# Patient Record
Sex: Male | Born: 1945 | Race: White | Hispanic: No | Marital: Married | Smoking: Former smoker
Health system: Southern US, Community
[De-identification: ages and names within clinical notes are randomized; demographics above are authoritative.]

## PROBLEM LIST (undated history)

## (undated) DIAGNOSIS — F32A Depression, unspecified: Secondary | ICD-10-CM

## (undated) DIAGNOSIS — N3289 Other specified disorders of bladder: Secondary | ICD-10-CM

## (undated) DIAGNOSIS — R7881 Bacteremia: Secondary | ICD-10-CM

## (undated) DIAGNOSIS — F329 Major depressive disorder, single episode, unspecified: Secondary | ICD-10-CM

## (undated) DIAGNOSIS — N189 Chronic kidney disease, unspecified: Secondary | ICD-10-CM

## (undated) DIAGNOSIS — D494 Neoplasm of unspecified behavior of bladder: Secondary | ICD-10-CM

## (undated) DIAGNOSIS — M109 Gout, unspecified: Secondary | ICD-10-CM

## (undated) DIAGNOSIS — C801 Malignant (primary) neoplasm, unspecified: Secondary | ICD-10-CM

## (undated) DIAGNOSIS — E785 Hyperlipidemia, unspecified: Secondary | ICD-10-CM

## (undated) DIAGNOSIS — G709 Myoneural disorder, unspecified: Secondary | ICD-10-CM

## (undated) DIAGNOSIS — G2581 Restless legs syndrome: Secondary | ICD-10-CM

## (undated) DIAGNOSIS — T7840XA Allergy, unspecified, initial encounter: Secondary | ICD-10-CM

## (undated) DIAGNOSIS — Z905 Acquired absence of kidney: Secondary | ICD-10-CM

## (undated) HISTORY — DX: Other specified disorders of bladder: N32.89

## (undated) HISTORY — DX: Acquired absence of kidney: Z90.5

## (undated) HISTORY — DX: Allergy, unspecified, initial encounter: T78.40XA

## (undated) HISTORY — PX: APPENDECTOMY: SHX54

## (undated) HISTORY — DX: Hyperlipidemia, unspecified: E78.5

## (undated) HISTORY — DX: Major depressive disorder, single episode, unspecified: F32.9

## (undated) HISTORY — DX: Myoneural disorder, unspecified: G70.9

## (undated) HISTORY — DX: Chronic kidney disease, unspecified: N18.9

## (undated) HISTORY — PX: TONSILLECTOMY AND ADENOIDECTOMY: SUR1326

## (undated) HISTORY — DX: Restless legs syndrome: G25.81

## (undated) HISTORY — DX: Depression, unspecified: F32.A

## (undated) HISTORY — PX: NASAL SEPTUM SURGERY: SHX37

## (undated) HISTORY — DX: Gout, unspecified: M10.9

---

## 2004-04-23 ENCOUNTER — Ambulatory Visit (HOSPITAL_BASED_OUTPATIENT_CLINIC_OR_DEPARTMENT_OTHER): Admission: RE | Admit: 2004-04-23 | Discharge: 2004-04-23 | Payer: Self-pay | Admitting: Otolaryngology

## 2005-01-05 HISTORY — PX: FRACTURE SURGERY: SHX138

## 2005-01-23 ENCOUNTER — Ambulatory Visit (HOSPITAL_COMMUNITY): Admission: RE | Admit: 2005-01-23 | Discharge: 2005-01-24 | Payer: Self-pay | Admitting: Orthopedic Surgery

## 2005-01-23 ENCOUNTER — Ambulatory Visit (HOSPITAL_COMMUNITY): Admission: RE | Admit: 2005-01-23 | Discharge: 2005-01-23 | Payer: Self-pay | Admitting: Orthopedic Surgery

## 2006-03-24 ENCOUNTER — Ambulatory Visit (HOSPITAL_COMMUNITY): Admission: RE | Admit: 2006-03-24 | Discharge: 2006-03-24 | Payer: Self-pay | Admitting: Otolaryngology

## 2008-07-08 DIAGNOSIS — N3289 Other specified disorders of bladder: Secondary | ICD-10-CM

## 2008-07-08 HISTORY — DX: Other specified disorders of bladder: N32.89

## 2008-07-08 HISTORY — PX: BLADDER SURGERY: SHX569

## 2008-09-17 ENCOUNTER — Inpatient Hospital Stay (HOSPITAL_COMMUNITY): Admission: EM | Admit: 2008-09-17 | Discharge: 2008-09-20 | Payer: Self-pay | Admitting: Emergency Medicine

## 2009-05-01 ENCOUNTER — Encounter (INDEPENDENT_AMBULATORY_CARE_PROVIDER_SITE_OTHER): Payer: Self-pay | Admitting: *Deleted

## 2009-05-24 ENCOUNTER — Encounter (INDEPENDENT_AMBULATORY_CARE_PROVIDER_SITE_OTHER): Payer: Self-pay

## 2009-05-26 ENCOUNTER — Ambulatory Visit: Payer: Self-pay | Admitting: Gastroenterology

## 2009-06-05 ENCOUNTER — Ambulatory Visit: Payer: Self-pay | Admitting: Gastroenterology

## 2009-06-05 HISTORY — PX: COLONOSCOPY: SHX174

## 2009-06-09 ENCOUNTER — Encounter: Payer: Self-pay | Admitting: Gastroenterology

## 2010-10-03 ENCOUNTER — Emergency Department (HOSPITAL_COMMUNITY): Payer: 59

## 2010-10-03 ENCOUNTER — Observation Stay (HOSPITAL_COMMUNITY)
Admission: EM | Admit: 2010-10-03 | Discharge: 2010-10-04 | Disposition: A | Discharge status: Home / Self Care | Payer: 59 | Attending: Internal Medicine | Admitting: Internal Medicine

## 2010-10-03 DIAGNOSIS — N400 Enlarged prostate without lower urinary tract symptoms: Secondary | ICD-10-CM | POA: Insufficient documentation

## 2010-10-03 DIAGNOSIS — R079 Chest pain, unspecified: Secondary | ICD-10-CM

## 2010-10-03 DIAGNOSIS — J984 Other disorders of lung: Secondary | ICD-10-CM | POA: Insufficient documentation

## 2010-10-03 DIAGNOSIS — E785 Hyperlipidemia, unspecified: Secondary | ICD-10-CM | POA: Insufficient documentation

## 2010-10-03 DIAGNOSIS — F329 Major depressive disorder, single episode, unspecified: Secondary | ICD-10-CM | POA: Insufficient documentation

## 2010-10-03 DIAGNOSIS — F411 Generalized anxiety disorder: Secondary | ICD-10-CM | POA: Insufficient documentation

## 2010-10-03 DIAGNOSIS — G2581 Restless legs syndrome: Secondary | ICD-10-CM | POA: Insufficient documentation

## 2010-10-03 DIAGNOSIS — Z79899 Other long term (current) drug therapy: Secondary | ICD-10-CM | POA: Insufficient documentation

## 2010-10-03 DIAGNOSIS — R0789 Other chest pain: Principal | ICD-10-CM | POA: Insufficient documentation

## 2010-10-03 DIAGNOSIS — F3289 Other specified depressive episodes: Secondary | ICD-10-CM | POA: Insufficient documentation

## 2010-10-03 LAB — CK TOTAL AND CKMB (NOT AT ARMC)
CK, MB: 2.2 ng/mL (ref 0.3–4.0)
Relative Index: 1.6 (ref 0.0–2.5)
Total CK: 135 U/L (ref 7–232)
Total CK: 138 U/L (ref 7–232)

## 2010-10-03 LAB — HEPATIC FUNCTION PANEL
ALT: 24 U/L (ref 0–53)
Albumin: 3.7 g/dL (ref 3.5–5.2)
Bilirubin, Direct: 0.1 mg/dL (ref 0.0–0.3)

## 2010-10-03 LAB — TROPONIN I: Troponin I: <0.01 ng/mL (ref 0.00–0.06)

## 2010-10-03 LAB — CBC
HCT: 45.4 % (ref 39.0–52.0)
Hemoglobin: 15 g/dL (ref 13.0–17.0)
RBC: 5.05 MIL/uL (ref 4.22–5.81)
RDW: 13.2 % (ref 11.5–15.5)
WBC: 5.5 10*3/uL (ref 4.0–10.5)

## 2010-10-03 LAB — DIFFERENTIAL
Basophils Absolute: 0 10*3/uL (ref 0.0–0.1)
Basophils Relative: 1 % (ref 0–1)
Eosinophils Relative: 2 % (ref 0–5)
Monocytes Absolute: 0.4 10*3/uL (ref 0.1–1.0)
Neutro Abs: 3.4 10*3/uL (ref 1.7–7.7)

## 2010-10-03 LAB — CARDIAC PANEL(CRET KIN+CKTOT+MB+TROPI)
CK, MB: 1.9 ng/mL (ref 0.3–4.0)
Relative Index: 1.5 (ref 0.0–2.5)
Troponin I: 0.01 ng/mL (ref 0.00–0.06)

## 2010-10-03 LAB — BASIC METABOLIC PANEL
BUN: 18 mg/dL (ref 6–23)
Creatinine, Ser: 1.21 mg/dL (ref 0.4–1.5)
GFR calc non Af Amer: >60 mL/min (ref >60)
Glucose, Bld: 99 mg/dL (ref 70–99)

## 2010-10-03 LAB — T4, FREE: Free T4: 0.88 ng/dL (ref 0.80–1.80)

## 2010-10-04 ENCOUNTER — Ambulatory Visit (HOSPITAL_COMMUNITY): Payer: 59

## 2010-10-04 ENCOUNTER — Ambulatory Visit (HOSPITAL_COMMUNITY)
Admit: 2010-10-04 | Discharge: 2010-10-04 | Disposition: A | Discharge status: Home / Self Care | Payer: 59 | Source: Ambulatory Visit | Attending: Internal Medicine | Admitting: Internal Medicine

## 2010-10-04 DIAGNOSIS — R079 Chest pain, unspecified: Secondary | ICD-10-CM | POA: Insufficient documentation

## 2010-10-04 LAB — URINALYSIS, ROUTINE W REFLEX MICROSCOPIC
Protein, ur: NEGATIVE mg/dL
Specific Gravity, Urine: 1.013 (ref 1.005–1.030)
pH: 6 (ref 5.0–8.0)

## 2010-10-04 LAB — BASIC METABOLIC PANEL
CO2: 25 mEq/L (ref 19–32)
GFR calc non Af Amer: >60 mL/min (ref >60)
Glucose, Bld: 115 mg/dL — ABNORMAL HIGH (ref 70–99)
Potassium: 4.6 mEq/L (ref 3.5–5.1)
Sodium: 140 mEq/L (ref 135–145)

## 2010-10-04 LAB — CARDIAC PANEL(CRET KIN+CKTOT+MB+TROPI): CK, MB: 1.6 ng/mL (ref 0.3–4.0)

## 2010-10-04 LAB — CBC
MCH: 29.8 pg (ref 26.0–34.0)
MCHC: 33.2 g/dL (ref 30.0–36.0)
MCV: 89.9 fL (ref 78.0–100.0)
Platelets: 200 10*3/uL (ref 150–400)
RBC: 4.76 MIL/uL (ref 4.22–5.81)

## 2010-10-04 LAB — LIPID PANEL
Total CHOL/HDL Ratio: 4.9 RATIO
VLDL: 35 mg/dL (ref 0–40)

## 2010-10-04 MED ORDER — TECHNETIUM TC 99M TETROFOSMIN IV KIT
10.0000 | PACK | Freq: Once | INTRAVENOUS | Status: AC | PRN
  Administered 2010-10-04: 10 via INTRAVENOUS

## 2010-10-04 MED ORDER — TECHNETIUM TC 99M TETROFOSMIN IV KIT
30.0000 | PACK | Freq: Once | INTRAVENOUS | Status: AC | PRN
  Administered 2010-10-04: 30 via INTRAVENOUS

## 2010-10-04 NOTE — Consult Note (Signed)
[name], [name]                  ACCOUNT NO.:  [id number]  MEDICAL RECORD NO.:  [id number]           PATIENT TYPE:  O  LOCATION:  1406                         FACILITY:  [facility]  PHYSICIAN:  Bevelyn Buckles. Bensimhon, MDDATE OF BIRTH:  Jul 31, 1945  DATE OF CONSULTATION:  10/03/2010 DATE OF DISCHARGE:                                CONSULTATION   REFERRING DOCTOR:  Vania Rea, MD, of the [facility].  PRIMARY CARE PHYSICIAN:  [facility].  REASON FOR CONSULTATION:  Chest pain.  HISTORY OF PRESENT ILLNESS:  [name] is a delightful 65 year old male with a history of hyperlipidemia, overweight, depression, and previous bladder rupture, status post repair in March 2010.  He denies any history of known cardiac disease.  He said he had a remote stress test in the 1980s for unclear reasons.  Last week, he had a brief episode of chest pain at rest.  This resolved without a problem.  He did not think much of it.  However, it did prompt him to want to get into a better shape.  On Monday, he went out for a brisk two-mile walk without any problems.  On Tuesday, he followed this up with another two mile walk with a friend and also did very well without any chest pain or undue dyspnea.  However, today while he was at work, he had several episodes of chest pressure radiating to his jaw. These lasted about 5-10 minutes and resolved spontaneously.  He denies any associated symptoms.  He did not have any diaphoresis.  No nausea and vomiting.  No abdominal pain.  No gastroesophageal reflux.  On arrival to the emergency room, he was essentially chest pain free.  EKG showed normal sinus rhythm with no ST-T wave abnormalities.  He has had two sets of cardiac markers, both of which have been normal.  REVIEW OF SYMPTOMS:  He denies any heart failure symptoms.  He has not had any focal neurologic symptoms.  No bright red blood per rectum or melena.  He has not had any  extended travel.  Remainder of review of systems are negative except for HPI and problem list.  PROBLEM LIST: 1. Hyperlipidemia. 2. Depression. 3. Overweight. 4. History of bladder rupture, status post repair. 5. History of appendectomy and tonsillectomy. 6. Restless legs syndrome.  HOME MEDICATIONS:  Flonase, vitamin D, aspirin 81, Uroxatral 10 mg a day, Requip 2 mg at bedtime, Klonopin 0.5 at bedtime, Zocor 10 a day, Celexa 20 mg a day, and fish oil 1200 mg twice a day.  ALLERGIES:  No known drug allergies.  SOCIAL HISTORY:  He is married.  He has grown children.  He works as a Engineer, water.  He has a history of tobacco use but quit well over 20 years ago.  Drinks just occasionally.  FAMILY HISTORY:  Mother died from ovarian cancer.  Father died from cirrhosis of liver.  There is no family history of premature coronary artery disease.  PHYSICAL EXAMINATION:  GENERAL:  He is very pleasant, in no acute distress.  VITAL SIGNS:  Respirations are unlabored.  Blood pressure  is 123/79, heart rate 60, saturating 98% on room air.  HEENT:  Normal. NECK:  Supple.  No JVD.  Carotids are 2+ bilaterally without bruits. There is no lymphadenopathy or thyromegaly.  CARDIOVASCULAR:  PMI is nondisplaced.  Regular rate and rhythm.  No murmurs, rubs or gallops. LUNGS:  Clear.  ABDOMEN:  Mildly obese, nontender, nondistended.  No hepatosplenomegaly, no bruits.  No masses.  EXTREMITIES:  Warm with no cyanosis, clubbing or edema.  There are good pulses.  No rash. NEUROLOGIC:  Alert and oriented x3.  Cranial nerves II-XII intact. Moves all fours without difficulty.  Affect is pleasant.  PERTINENT LABORATORY AND X-RAY DATA:  EKG shows sinus rhythm with a rate of 60 with no ST-T wave abnormalities.  Troponin has been negative at 0.01 x2.  CK-MBs have been normal.  Sodium 141, potassium 4.8, BUN 18, creatinine 1.21.  White count 5.5, hemoglobin 15, glucose of 199. Thyroid and liver panels are  pending for the morning.  Chest x-ray was normal except for the possibility of some small bilateral upper lobe nodules.  ASSESSMENT: 1. Chest pain with typical and atypical features.  Currently ruling     out for myocardial infarction by serial cardiac enzymes.  There is     a normal EKG. 2. Hyperlipidemia. 3. History of bladder rupture, status post repair. 4. Overweight. 5. Question of small pulmonary nodules.  PLAN/DISCUSSION:  I have had a long talk with [name] regarding his situation.  We have reviewed the options of stress testing versus cardiac catheterization.  At this point, he would like to proceed with stress testing.  I think this is a very reasonable choice.  We have decided to proceed with a treadmill Myoview in the morning.  We will continue his aspirin and statin.  Should he have recurrent chest pain or other signs of ischemia overnight, we can reconsider our options.     Bevelyn Buckles. Bensimhon, MD     DRB/MEDQ  D:  10/03/2010  T:  10/03/2010  Job:  [id number]  Electronically Signed by Arvilla Meres MD on 10/04/2010 06:47:08 PM

## 2010-10-05 NOTE — Discharge Summary (Signed)
[name], [name] NO.:  [id number]  MEDICAL RECORD NO.:  [id number]           PATIENT TYPE:  O  LOCATION:  1406                         FACILITY:  [facility]  PHYSICIAN:  Hillery Aldo, M.D.   DATE OF BIRTH:  [birth year]  DATE OF ADMISSION:  10/03/2010 DATE OF DISCHARGE:  10/04/2010                              DISCHARGE SUMMARY   PRIMARY CARE PROVIDER:  Henri Medal at [facility].  DISCHARGE DIAGNOSES: 1. Noncardiac chest pain. 2. Depression. 3. Anxiety disorder. 4. Hyperlipidemia. 5. Benign prostatic hypertrophy. 6. Bilateral upper lobe nodules.  DISCHARGE MEDICATIONS: 1. Xanax 0.25 mg 1-2 tablets p.o. t.i.d. p.r.n. anxiety or tension. 2. Aspirin 81 mg p.o. daily. 3. Celexa 20 mg p.o. daily. 4. Fish oil 1200 mg p.o. b.i.d. 5. Fluticasone propionate nasal spray 2 sprays intranasally q.a.m. 6. Klonopin 0.5 mg p.o. q.h.s. 7. ReQuip 2 mg p.o. q.h.s. 8. Uroxatral 10 mg p.o. daily. 9. Vitamin D3 1000 units p.o. daily. 10.Zocor 10 mg p.o. q.h.s.  CONSULTATIONS:  Bevelyn Buckles. Bensimhon, MD, of Cardiology.  BRIEF ADMISSION HISTORY OF PRESENT ILLNESS:  The patient is a 65 year old male with no known history of coronary artery disease, who presented to the hospital with a chief complaint of intermittent brief episodes of nonexertional chest pain.  The patient states that each episode lasted approximately 10 minutes, was substernal radiating up into the jaw and not associated with diaphoresis or nausea.  The patient denied any history of reflux-type symptoms.  He was evaluated by the emergency department physician and subsequently referred to the Hospitalist Service for inpatient chest pain rule out.  For the full details, please see the dictated report done by Dr. Orvan Falconer.  PROCEDURES AND DIAGNOSTIC STUDIES: 1. Chest x-ray on October 03, 2010, showed possible small bilateral     upper lobe nodules which were not seen on the lateral view.   These     were likely artifacts, but small nodules could not be excluded.     Recommend followup short-term chest x-ray or CT scan of the chest     with contrast as an outpatient for further evaluation. 2. Nuclear stress test on October 04, 2010, showed no evidence for     ischemia with good exercise tolerance.  LVEF was greater than 70%.  DISCHARGE LABORATORY VALUES:  Cholesterol was 143, triglycerides 174, HDL 29, LDL 79.  Magnesium was 2.4.  Sodium was 140, potassium 4.6, chloride 109, bicarb 25, BUN 16, creatinine 1.19, glucose 115, calcium 8.8.  White blood cell count was 5.3, hemoglobin 14.2, hematocrit 42.8, platelets 200.  Urinalysis was negative.  Cardiac markers were negative x3 sets.  TSH was 2.020 with a free T4 of 0.88.  HOSPITAL COURSE BY PROBLEM: 1. Noncardiac chest pain:  The patient was admitted to the telemetry     unit and monitored with serial cardiac markers.  Cardiology was     consulted for consideration of a stress test which the patient     underwent today.  There is no evidence of ischemia on the stress test.  At this point, would recommend symptomatic  treatment and     daily aspirin therapy with close followup with his primary care     physician for non-resolution of symptoms. 2. Depression:  The patient was maintained on his usual dose of     Celexa. 3. Generalized anxiety disorder:  The patient does admit to     significant stress at work and some temporal relationship between     stressful episodes at work and the onset of his chest pain     symptoms.  He was provided with a prescription for Xanax to take to     see if this improves his symptoms. 4. Hyperlipidemia:  The patient's lipids appeared to be reasonably     well controlled on his current dose of statin and fish oil therapy. 5. Benign prostatic hypertrophy:  The patient was maintained on his     usual dose of Uroxatral. 6. Pulmonary nodules:  The patient did have possible pulmonary nodules     on  chest radiography that were not seen on the lateral view and     were too small to characterize.  A followup chest x-ray in 3 months     versus a CT scan of the chest with contrast should be done as an     outpatient to ensure stability and/or evaluation of these nodules.  CONDITION AT DISCHARGE:  Stable.  Time spent coordinating care for discharge and discharge instructions including face-to-face time equals 25 minutes.     Hillery Aldo, M.D.     CR/MEDQ  D:  10/04/2010  T:  10/04/2010  Job:  [id number]  cc:   [facility]  Electronically Signed by Hillery Aldo M.D. on 10/05/2010 07:06:42 AM

## 2010-10-09 NOTE — H&P (Signed)
NAMEDEBORAH, [name] NO.:  [id number]  MEDICAL RECORD NO.:  [id number]           PATIENT TYPE:  E  LOCATION:  WLED                         FACILITY:  [facility]  PHYSICIAN:  Vania Rea, M.D. DATE OF BIRTH:  December 12, 1945  DATE OF ADMISSION:  10/03/2010 DATE OF DISCHARGE:                             HISTORY & PHYSICAL   PRIMARY CARE PHYSICIAN:  [facility]  CHIEF COMPLAINTS:  Recurrent chest pain since today.  HISTORY OF PRESENT ILLNESS:  This is a 65 year old Caucasian gentleman with no significant cardiac history, who has never had a cardiac workup, who presents to the emergency room complaining of recurrent chest pain since this morning.  The patient says the pain started for the first time about 9 o'clock this morning, a pressing chest pain about 7/10, radiating up into his jaw, and lasted about 4 minutes.  There was no precipitating or relieving factor.  It was not associated with nausea, dizziness, nor diaphoresis or shortness of breath.  The pain recurred again about an hour later and this time lasted for about 10 minutes and he decided to come to the emergency room.  While waiting in the emergency room, the patient had onset again, and again lasted about 10 minutes but by the time he saw the doctor, it had resolved completely. He did receive aspirin.  The patient is now currently pain free.  The patient says he does not exercise, but he has started to walk for the past 2 days.  He has been walking about two miles each day.  The first day briskly, the second day not so briskly.  He had no associated chest pains, nor shortness of breath on his walk.  He occasionally climbs stairs at home about four times per week but never has chest pain climbing stairs.  He has no orthopnea, no lower extremity edema.  No calf pain.  He has had no recent travel interstate, nor airplane.  He has had no bloody, nor black stool.  PAST MEDICAL HISTORY:   Depression, hyperlipidemia, restless legs syndrome, past history of bladder ruptures status post fall.  PAST SURGICAL HISTORY: 1. Includes, repair of bladder rupture through a laparotomy. 2. Appendectomy. 3. Nasal septoplasty. 4. Tonsillectomy.  MEDICATIONS: 1. Flonase nasal spray, 2 sprays each morning. 2. Vitamin D 1000 units daily. 3. Aspirin 81 mg daily. 4. Uroxatral 10 mg each morning. 5. Requip 2 mg at bedtime. 6. Klonopin 0.5 mg at bedtime. 7. Zocor 10 mg at bedtime. 8. Celexa 20 mg each morning. 9. Fish oil 1200 mg twice daily.  ALLERGIES:  No known drug allergies.  SOCIAL HISTORY:  Denies tobacco or illicit drug use.  He drinks on average one beer per week.  He works as a Psychologist, clinical.  He is married.  His children are grown.  He lives with his wife in a two-story home.  REVIEW OF SYSTEMS:  Other than noted above, completely unremarkable.  PHYSICAL EXAM:  GENERAL:  A very pleasant, slightly obese, 65 year old Caucasian gentleman lying on the stretcher.  VITAL SIGNS:  He gives his height  as 5 feet 10 inches and his weight as 91.1 kg.  HEENT:  His pupils are round, equal, and reactive.  Mucous membranes pink. Anicteric.  No oral Candida.  NECK:  No cervical lymphadenopathy.  He does have slight thyromegaly.  No nodules are felt.  No carotid bruit. CHEST:  Clear to auscultation bilaterally.  CARDIOVASCULAR:  Regular rhythm.  No murmur.  ABDOMEN:  Mildly obese, but soft, nontender.  No masses.  EXTREMITIES:  Without edema.  He has no bony joint deformities. NEUROLOGIC:  Cranial nerves II-XII are grossly intact.  He has no focal lateralizing signs.  LABORATORY DATA:  His white count is 5.5, hemoglobin 15.0, platelets 199.  He has a normal differential.  His sodium is 141, potassium 4.8, chloride 108, CO2 27, glucose 99, BUN 18, creatinine 1.2, calcium 9.0. Cardiac markers are completely normal with undetectable troponins and normal  CK-MB.  His EKG shows normal sinus rhythm with no ST-segment abnormality.  His chest x-ray shows no acute abnormality but there is a question of whether there are two lung nodules versus artifact.  ASSESSMENT:  Chest pain radiating to jaw, questionable unstable angina. His risk factors include obesity, hyperlipidemia and age.  There is no family history of cardiac problems.  PLAN:  Will bring this gentleman on observation to cycle his cardiac enzymes and the patient can in all likelihood have an inpatient or outpatient stress test depending on his hospital course.  Will discuss with a cardiologist.     Vania Rea, M.D.     LC/MEDQ  D:  10/03/2010  T:  10/03/2010  Job:  [id number]  cc:   [facility] [address] [place], [place] [address]  Electronically Signed by Vania Rea M.D. on 10/09/2010 12:49:17 AM

## 2010-10-18 LAB — CBC
HCT: 40.5 % (ref 39.0–52.0)
HCT: 42.2 % (ref 39.0–52.0)
Hemoglobin: 14.4 g/dL (ref 13.0–17.0)
Hemoglobin: 14.5 g/dL (ref 13.0–17.0)
MCHC: 34.4 g/dL (ref 30.0–36.0)
MCHC: 35.5 g/dL (ref 30.0–36.0)
MCV: 88.4 fL (ref 78.0–100.0)
MCV: 89.1 fL (ref 78.0–100.0)
Platelets: 194 K/uL (ref 150–400)
Platelets: 211 K/uL (ref 150–400)
RBC: 4.59 MIL/uL (ref 4.22–5.81)
RBC: 4.74 MIL/uL (ref 4.22–5.81)
RDW: 13.6 % (ref 11.5–15.5)
RDW: 14.1 % (ref 11.5–15.5)
WBC: 12.9 K/uL — ABNORMAL HIGH (ref 4.0–10.5)
WBC: 9.8 K/uL (ref 4.0–10.5)

## 2010-10-18 LAB — POCT I-STAT, CHEM 8
BUN: 31 mg/dL — ABNORMAL HIGH (ref 6–23)
Calcium, Ion: 1.03 mmol/L — ABNORMAL LOW (ref 1.12–1.32)
Chloride: 108 meq/L (ref 96–112)
Creatinine, Ser: 2.9 mg/dL — ABNORMAL HIGH (ref 0.4–1.5)
Glucose, Bld: 91 mg/dL (ref 70–99)
HCT: 44 % (ref 39.0–52.0)
Hemoglobin: 15 g/dL (ref 13.0–17.0)
Potassium: 4.9 meq/L (ref 3.5–5.1)
Sodium: 134 meq/L — ABNORMAL LOW (ref 135–145)
TCO2: 19 mmol/L (ref 0–100)

## 2010-10-18 LAB — BASIC METABOLIC PANEL WITH GFR
BUN: 24 mg/dL — ABNORMAL HIGH (ref 6–23)
CO2: 22 meq/L (ref 19–32)
Calcium: 7.4 mg/dL — ABNORMAL LOW (ref 8.4–10.5)
Chloride: 108 meq/L (ref 96–112)
Creatinine, Ser: 1.71 mg/dL — ABNORMAL HIGH (ref 0.4–1.5)
GFR calc non Af Amer: 41 mL/min — ABNORMAL LOW
Glucose, Bld: 185 mg/dL — ABNORMAL HIGH (ref 70–99)
Potassium: 5.2 meq/L — ABNORMAL HIGH (ref 3.5–5.1)
Sodium: 136 meq/L (ref 135–145)

## 2010-10-18 LAB — DIFFERENTIAL
Basophils Absolute: 0 K/uL (ref 0.0–0.1)
Basophils Relative: 0 % (ref 0–1)
Eosinophils Absolute: 0 K/uL (ref 0.0–0.7)
Eosinophils Relative: 0 % (ref 0–5)
Lymphocytes Relative: 11 % — ABNORMAL LOW (ref 12–46)
Lymphs Abs: 1.1 K/uL (ref 0.7–4.0)
Monocytes Absolute: 0.5 K/uL (ref 0.1–1.0)
Monocytes Relative: 5 % (ref 3–12)
Neutro Abs: 8.1 K/uL — ABNORMAL HIGH (ref 1.7–7.7)
Neutrophils Relative %: 83 % — ABNORMAL HIGH (ref 43–77)

## 2010-10-18 LAB — CK TOTAL AND CKMB (NOT AT ARMC)
CK, MB: 3 ng/mL (ref 0.3–4.0)
Relative Index: 1.1 (ref 0.0–2.5)
Total CK: 261 U/L — ABNORMAL HIGH (ref 7–232)

## 2010-10-18 LAB — URINALYSIS, ROUTINE W REFLEX MICROSCOPIC
Bilirubin Urine: NEGATIVE
Glucose, UA: NEGATIVE mg/dL
Ketones, ur: 15 mg/dL — AB
Specific Gravity, Urine: 1.014 (ref 1.005–1.030)
pH: 5 (ref 5.0–8.0)

## 2010-10-18 LAB — URINE CULTURE: Colony Count: NO GROWTH

## 2010-10-18 LAB — BASIC METABOLIC PANEL
CO2: 28 mEq/L (ref 19–32)
Chloride: 107 mEq/L (ref 96–112)
Glucose, Bld: 120 mg/dL — ABNORMAL HIGH (ref 70–99)
Potassium: 4 mEq/L (ref 3.5–5.1)
Sodium: 138 mEq/L (ref 135–145)

## 2010-10-18 LAB — URINE MICROSCOPIC-ADD ON

## 2010-11-20 NOTE — Op Note (Signed)
NAMELONDELL, NOLL NO.:  [id number]   MEDICAL RECORD NO.:  [id number]          PATIENT TYPE:  INP   LOCATION:  5148                         FACILITY:  [facility]   PHYSICIAN:  Excell Seltzer. Annabell Howells, M.D.    DATE OF BIRTH:  31-Dec-1945   DATE OF PROCEDURE:  DATE OF DISCHARGE:                               OPERATIVE REPORT   CHIEF COMPLAINT:  Abdominal pain.   HISTORY:  Mr. Bart is a 65 year old white male who was out at a club  last night, and on his was away out to the parking lot, he fell and  scraped of his hands and his left forearm but was otherwise initially  without complaints.  However, he woke in the night with abdominal pain  that was severe and with the inability to void other than a small amount  of blood.  He came to the emergency room where a Foley catheter was  inserted with return of some urine, but a CT abdomen and pelvis was then  obtained without contrast because his creatinine was 2.9 and he was  noted to have a distended bladder despite the Foley.  There was  extraperitoneal extravasation of urine primarily but with some  intraperitoneal extravasation along the left abdominal wall and  perihepatic area.  He was noted to have some gas at the dome of the  bladder and in the urinoma as well.  He continues to have lower  abdominal pain, and the catheter is currently not draining well.  He has  no prior urologic history and was voiding normally prior to yesterday.   Past history is pertinent for no drug allergies.   Current medications include Zocor, Celexa, baby aspirin, fish oil, and  ReQuip.   Medical history is pertinent for:  1. Depression.  2. Hypercholesterolemia.  3. Restless leg syndrome.   Family history is noncontributory.   SOCIAL HISTORY:  He is a former smoker.  He does drink but reports it is  sporadically.   REVIEW OF SYSTEMS:  He has severe abdominal pain and hematuria.  He  denies prior voiding complaints.  He denies chest pain,  shortness of  breath, lower extremity edema, or headache.  He denies recent bowel  complaints.  He is, otherwise, entirely without complaints per full  review of systems.   PHYSICAL EXAMINATION:  VITAL SIGNS:  His temp is 99.2, pulse is 70,  respirations 19, with an O2 sat of 97% on room air.  Blood pressure is  110/66.  General:  He is a well-developed, well-nourished white male who is  medicated, in no acute distress.  HEAD:  Normocephalic, atraumatic.  NECK:  Supple.  LUNGS:  Clear, but his respiratory excursion is limited by abdominal  pain.  HEART:  Regular rate and rhythm.  ABDOMEN:  Distended and diffusely tender, but with the greatest  tenderness in the suprapubic area.  No obvious masses or splenomegaly  are noted.  No hernias noted.  GU:  An unremarkable phallus with a 14-French Foley at the meatus.  Scrotum is unremarkable.  Testicles are descended without  mass.  Epididymides are unremarkable.  RECTAL:  Exam performed by the ER physician and noted normal sphincter  tone, no masses, and a small prostate.  EXTREMITIES:  Full range of motion without edema.  SKIN:  Warm and dry.  He does have abrasion on the right palm, the left  forearm.  NEUROLOGICAL:  He is grossly intact.   CT scan was reviewed and discussed with the radiologist.  He is noted to  have primarily extraperitoneal extravasation of the bladder, which  remains descended despite the Foley.  There is tracking of urine  alongside the left abdominal cavity into the perihepatic area.  There  was utmost mild fullness of the kidneys.  The prostate is small on CT.   His CKs 261.  His white count is 9.8, hemoglobin is 14.5, platelet  counts 211,000.  BUN is 31.  Creatinine is 2.9.  Electrolytes,  otherwise, unremarkable.  Urine had 7-10 white cells, too numerous to  count red cells.   Acute bladder rupture likely related to his prior fall with no  predisposing urologic history.   PLAN:  I have changed his Foley  catheter to a 22-French size.  There was  no difficulty passing the catheter and I had a return of about 500 mL of  urine that was initially very light pink but then became more bloody  toward the end of drainage.   He is going to need repair of his bladder rupture with placement of  drains in the space of Retzius due to volume of urine extravasation.  The risk of these procedures were explained.  I will use cystoscopy as  well to assess the bladder rupture prior to exploration.  He is  currently on Rocephin and will be given Ancef in the holding room.      Excell Seltzer. Annabell Howells, M.D.  Electronically Signed     JJW/MEDQ  D:  09/17/2008  T:  09/18/2008  Job:  [id number]   cc:   Jettie Pagan, MD

## 2010-11-20 NOTE — Op Note (Signed)
NAMEBILAAL, LEIB NO.:  [id number]   MEDICAL RECORD NO.:  [id number]          PATIENT TYPE:  INP   LOCATION:  5148                         FACILITY:  [facility]   PHYSICIAN:  Excell Seltzer. Annabell Howells, M.D.    DATE OF BIRTH:  29-May-1946   DATE OF PROCEDURE:  09/17/2008  DATE OF DISCHARGE:                               OPERATIVE REPORT   PROCEDURES:  1. Cystoscopy.  2. Exploratory laparotomy.  3. Repair of bladder rupture.   PREOPERATIVE DIAGNOSIS:  Bladder rupture.   POSTOPERATIVE DIAGNOSIS:  Bladder rupture.   SURGEON:  Excell Seltzer. Annabell Howells, MD   ANESTHESIA:  General.   SPECIMEN:  None.   DRAINS:  22-French Foley catheter and #10 Blake drain.   BLOOD LOSS:  100 mL.   COMPLICATIONS:  None.   INDICATIONS:  Mr. [name] is a 65 year old white male who fell while  inebriated with a full bladder last night and sustained a bladder  rupture.  Then on CT appeared to be primarily extraperitoneal, but there  was tracking into the intraperitoneal cavity.  It was felt that surgical  exploration and repair was indicated.   FINDINGS AND PROCEDURE:  The patient had gotten a Rocephin earlier in  the day, but was given a gram of Ancef preoperatively.  He was taken to  the operating room where general anesthetic was induced.  He was placed  in a supine position.  His Foley catheter was removed.  His abdomen was  clipped.  He was prepped with Betadine solution and draped in usual  sterile fashion.  Time-out was performed.  Initially, a flexible  cystoscopy was performed with a 16-French scope.  Examination revealed a  normal urethra.  The external sphincter was intact.  The prostatic  urethra was short with bilobar hyperplasia without significant evidence  of obstruction.  The bladder was difficult to visualize because of  blood.   At this point, a lower midline incision was made from the pubis to about  10 cm proximally.  The subcutaneous tissue was opened with the Bovie  followed by  the anterior rectus fascia.  The rectus muscles were parted  in the midline.  Beneath the rectus muscles was a significant amount of  extravasated urine in the retropubic space.  This was aspirated out and  loculated areas were broken down.  Once the anterior extravasated urine  had been aspirated from the wound, a fresh 22-French Foley catheter was  inserted.  Balloon was filled with 10 mL sterile fluid and the bladder  was filled gently to allow identification of the anterior bladder wall.  The bladder wall was identified and grasped between Allis clamps.  The  bladder actually appeared quite large with thick wall.  The bladder wall  was then opened longitudinally on the anterior surface with the Bovie.  Once opened, the inspection of the bladder all revealed significant  hemorrhagic changes diffusely of the mucosa likely from overdistention.  On the dome of the bladder, there were 2 mucosal pairs.  One more  superficial than the second, which was through the muscularis  into the  perivesical space.  Once these tears were identified, the mucosal tear  was closed with a single layer 2-0 Vicryl suture.  The cystotomy was  extended to incorporate the more significant tear and then the cystotomy  was then closed in layers, initially, a 2-0 Vicryl muscular layer was  used.  Irrigation of the bladder at this point revealed some continued  leaking on the superior aspect.  It was further buttressed with a 3-0  Vicryl.  At this point, the closure was felt to be watertight, but a  second 2-0 Vicryl layer was used through the serosa and muscularis to  imbricate the closure.  Final irrigation revealed no extravasation.  At  this point, the bladder was irrigated with return of gradually clearing  urine.  A #10 Blake drain was then placed through a separate stab wound  lateral to the left side of the incision.  It was trimmed to length and  placed in the prevesical space.  The incision was inspected.  A  few  small bleeders were cauterized.  The patient did seem somewhat oozy  possibly because of his 3.5 g of fish oil daily and baby aspirin.  The  anterior rectus fascia was then closed using a running #1 PDS.  Subcutaneous tissue was irrigated and inspected for hemostasis.  The  skin was then closed with clips.  The drain had been secured with 0 silk  suture.  The drain tube was trimmed and the bulb suction was applied.  The Foley catheter was placed to straight drainage.  A dressing was  applied to the wound.  The patient's anesthetic was reversed.  He was  admitted to the recovery room in stable condition.  There were no  complications.      Excell Seltzer. Annabell Howells, M.D.  Electronically Signed     JJW/MEDQ  D:  09/17/2008  T:  09/18/2008  Job:  [id number]

## 2010-11-20 NOTE — Discharge Summary (Signed)
NAMECALDWELL, KRONENBERGER NO.:  [id number]   MEDICAL RECORD NO.:  [id number]          PATIENT TYPE:  INP   LOCATION:  5148                         FACILITY:  [facility]   PHYSICIAN:  Excell Seltzer. Annabell Howells, M.D.    DATE OF BIRTH:  May 24, 1946   DATE OF ADMISSION:  09/17/2008  DATE OF DISCHARGE:  09/20/2008                               DISCHARGE SUMMARY   Briefly, [name] is a 65 year old white male who had a fall on the night  of September 16, 2008, after drinking at a club.  He woke with abdominal  pain and came to the emergency room, was found to have bladder rupture,  it was felt to require exploration.   HOSPITAL COURSE:  On the day of admission, the patient was taken to the  operating room where he underwent cystoscopy and repair of bladder  rupture.  He was found to have 2 tears in the dome of the bladder that  were repaired.  Postoperatively, he was left in the Foley catheter and  did well.  His initial creatinine was 2.9, but gradually declined until  the day before discharge when it was 1.1.  His potassium got to 5.2, but  came down with change in his IV fluids and further resolution of  urinoma.  On September 20, 2008, he was tolerating regular diet and was felt  to be ready for discharge home.   He has no drug allergies.   DISCHARGE MEDICATIONS:  Vicodin, Cipro which he will be given prior the  day prior to followup and he will resume his home medications which  include Zocor, Celexa, baby aspirin, fish oil, and ReQuip.   MEDICAL HISTORY:  Pertinent for depression, hypercholesterolemia, and  restless leg syndrome in addition to the bladder rupture.   PROCEDURE DURING ADMISSION:  Cystoscopy and repair of bladder rupture.   FINAL DIAGNOSIS:  Bladder rupture.   COMPLICATIONS:  Mild hyperkalemia and transient renal insufficiency.   DISPOSITION:  To home.   FOLLOWUP:  He will follow up with me in 1 week for a voiding trial and  staple removal.   PROGNOSIS:  Good.   CONDITION:  Improved.     Excell Seltzer. Annabell Howells, M.D.  Electronically Signed    JJW/MEDQ  D:  09/20/2008  T:  09/20/2008  Job:  [id number]

## 2010-11-23 NOTE — Op Note (Signed)
NAMETREAVON, CASTILLEJA                  ACCOUNT NO.:  [id number]   MEDICAL RECORD NO.:  [id number]          PATIENT TYPE:  AMB   LOCATION:  SDS                          FACILITY:  [facility]   PHYSICIAN:  Christopher E. Ezzard Standing, M.D.DATE OF BIRTH:  Jan 31, 1946   DATE OF PROCEDURE:  03/24/2006  DATE OF DISCHARGE:  03/24/2006                                 OPERATIVE REPORT   PREOPERATIVE DIAGNOSIS:  Turbinate hypertrophy with nasal obstruction.   POSTOPERATIVE DIAGNOSIS:  Turbinate hypertrophy with nasal obstruction.   OPERATIONS:  Bilateral inferior turbinate reductions.   SURGEON:  Kristine Garbe. Ezzard Standing, M.D.   ANESTHESIA:  General endotracheal.   COMPLICATIONS:  None.   BRIEF CLINICAL NOTE:  [name] is a 65 year old gentleman who has had  history of snoring and sleep apnea.  He uses a CPAP at home.  He also  continues to complain of nasal obstruction despite having previous  septoplasty and turbinate reductions as well as having had previous  tonsillectomy.  He is taken to the operating room at this time for further  reduction of his turbinates to help improve the nasal airway.  He has  minimal septal deformity.   DESCRIPTION OF PROCEDURE:  After adequate endotracheal anesthesia, the nose  was prepped with a Betadine solution, draped out with sterile towels.  The  nose was then further prepped with cotton pledgets soaked in heparin, and  inferior turbinates were injected with Xylocaine with epinephrine.  On the  right side, the inferior one-half of the turbinate was amputated.  Suction  cautery was used for hemostasis and the remaining turbinate tissue was out-  fractured.  On the left side, an incision was made along the inferior edge  of the turbinate.  The mucosa was elevated off the turbinate bone and  submucosal turbinate bone was removed.  The turbinate was then cauterized  and out-fractured.  There was minimal septal deformity to the right.  There  were no polyps, no  obstructive lesions.  The posterior nasal cavity had just  minimal adenoid tissue and some of the adenoid remnants were cauterized with  suction cautery.  This completed the procedure.  No packing was placed.  [name] was awoke from anesthesia and transferred to the recovery room,  postoperatively doing well.   DISPOSITION:  [name] was discharged home later this morning on Tylenol and  Vicodin p.r.n. pain, Keflex 500 mg b.i.d. for 1 week.  Will have him follow  up in my office in 7-10 days for recheck.           ______________________________  Kristine Garbe. Ezzard Standing, M.D.     CEN/MEDQ  D:  03/24/2006  T:  03/24/2006  Job:  [id number]

## 2010-11-23 NOTE — Op Note (Signed)
[name], MEMMER NO.:  [id number]   MEDICAL RECORD NO.:  [id number]          PATIENT TYPE:  OIB   LOCATION:  2899                         FACILITY:  [facility]   PHYSICIAN:  Leonides Grills, M.D.     DATE OF BIRTH:  Nov 17, 1945   DATE OF PROCEDURE:  01/23/2005  DATE OF DISCHARGE:                                 OPERATIVE REPORT   PREOPERATIVE DIAGNOSIS:  Right lateral malleolus fracture nonunion.   POSTOPERATIVE DIAGNOSIS:  Right lateral malleolus fracture nonunion.   OPERATION/PROCEDURE:  1.  Open reduction and internal fixation right lateral malleolus fracture      nonunion.  2.  Right calcaneal local bone graft.  3.  Stress x-rays, right ankle.   ANESTHESIA:  General endotracheal tube.   SURGEON:  Leonides Grills, M.D.   INDICATIONS:  This a 65 year old male who nine months ago sustained the  above injury.  He __________.  He was consented for the above procedure. All  risks which included infection, neurovascular injury, nonunion, malunion,  scar tissue, hardware failure,  persistent pain, worsening pain, and  prolonged recovery, stiffness, or arthritis were all explained.  Questions  encouraged and answered.   DESCRIPTION OF PROCEDURE:  The patient was brought to the operating room,  placed in the supine position after adequate general endotracheal anesthesia  was administered as well as Ancef 1 g IV piggyback.  The right lower  extremity was then prepped and draped in the sterile manner over a  proximally placed thigh tourniquet.  The limb was gravity exsanguinated and  tourniquet was elevated to 290 mmHg.   We then obtained x-rays and stress x-rays that showed the fracture was  moving.  We made a longitudinal incision over the calcaneal tubercle  laterally.  Dissection was carried down through the skin.  Hemostasis was  obtained.  Blunt dissection was taken down directly to bone and with a 4.5  drill, a drill hole was then made into the superolateral  aspect of the  calcaneal tubercle.  Bone graft was then harvested with the drill bit as  well as curet.  Once this was removed, this was placed on the back table for  later grafting of the fracture site.  We then made a longitudinal incision  laterally over the lateral malleolus.  Dissection was carried down to bone.  Hemostasis was obtained.  Fracture site was identified under C-arm guidance  with the [facility].  This was then opened completely.  The remaining cartilage  in the nonunion and fibrous tissue was then removed with rongeur and the  area was completely prepped with not only a Freer and wound-handle elevator,  but also by a bur.  We then put a two-point reduction clamp, anatomically  reduced the fracture and applied tension through the clamp.  A 2.5 drill was  then drilled and a 3.5 mm, 50 mm long fully-threaded cortical set screw was  then applied.  Clamp was released and x-rays were obtained.  Three views of  the ankle stress x-ray showed no gross motion of the fracture and anatomic  alignment.  Fixation in the proper position.  Tourniquet was deflated.  Hemostasis obtained.  Stress and strain relieving bone graft was then  applied at the fracture site using the bur and tamped into place.  Once the  area was copiously irrigated  with normal saline, skin was closed over both wounds with 4-0 nylon suture  and 0.5% Marcaine without epinephrine was then instilled into the area.  Sterile dressing was applied. Modified Jones dressing applied with ankle in  loose dorsiflexion.  The patient was stable to the PR.       PB/MEDQ  D:  01/23/2005  T:  01/23/2005  Job:  [id number]

## 2010-11-23 NOTE — Procedures (Signed)
[name], Austin NO.:  [id number]   MEDICAL RECORD NO.:  [id number]          PATIENT TYPE:  OUT   LOCATION:  SLEEP CENTER                 FACILITY:  [facility]   PHYSICIAN:  Clinton D. Maple Hudson, M.D. DATE OF BIRTH:  21-Jun-1946   DATE OF STUDY:  04/23/2004                              NOCTURNAL POLYSOMNOGRAM   REFERRING PHYSICIAN:  Jefry H. Pollyann Kennedy, MD   INDICATION FOR STUDY AND HISTORY:  1.  Hypersomnia with sleep apnea.  2.  Other sleep disturbance.   Epworth sleepiness score 7/24, neck size 16 inches, BMI 28.6, weight 200  pounds.   SLEEP ARCHITECTURE:  Total sleep time 334 minutes with sleep efficiency of  68%.  There were frequent position changes, and spontaneous awakenings were  noted.  Stage I was 6%; stage II, 86%; Stages III and IV 4%.  REM was 4% of  total sleep time.  Sleep latency was 87 minutes, REM latency 268 minutes.  Awake after sleep onset 67 minute.  Arousal index 48.  No sleep medication  was recorded.   RESPIRATORY DATA:  Split study protocol.  RDI 46.5 per hour indicating  moderately severe obstructive sleep apnea/hypopnea syndrome before CPAP.  This included 2 obstructive apneas and 82 hypopneas.  Events were not  positional  REM RDI was 40 per hour.  CPAP was titrated to 15 CWP, RDI 8.6  per hour using a medium Comfort gel mask with heated humidity.   OXYGEN DATA:  Loud snoring with oxygen desaturation to nadir of 86%.  After  CPAP titration, saturation held 95% on room air.   CARDIAC DATA:  Normal sinus rhythm with sinus bradycardia, 58 and 68 beats  per minute.   MOVEMENT AND PARASOMNIA:  A total of 189 limb jerks were recorded of which  31 were associated with arousal or awakening for a periodic limb movement  with arousal index of 5.6 per hour which is increased and consistent with  periodic limb movement with arousal syndrome.   IMPRESSION AND RECOMMENDATIONS:  1.  Moderately severe obstructive sleep apnea/hypopnea syndrome, RDI  46.5      per hour with desaturation to 86%.  2.  CPAP titration to 15 CWP, RDI 8.6 per hour, using a medium Comfort gel      mask with heated humidifier.  3.  Periodic limb movement with arousal syndrome which can be reassessed      clinically after treating for sleep apnea if necessary.   Clinton D. Maple Hudson, M.D.  Diplomate, Biomedical engineer of Sleep Medicine                                                           Clinton D. Maple Hudson, M.D.  Diplomate, [employer] Board   CDY/MEDQ  D:  04/29/2004 09:02:45  T:  04/29/2004 19:25:06  Job:  [id number]

## 2012-12-22 ENCOUNTER — Ambulatory Visit (INDEPENDENT_AMBULATORY_CARE_PROVIDER_SITE_OTHER): Payer: 59 | Admitting: Family Medicine

## 2012-12-22 ENCOUNTER — Encounter: Payer: Self-pay | Admitting: Family Medicine

## 2012-12-22 VITALS — BP 110/68 | HR 70 | Temp 98.1°F | Resp 16 | Ht 69.0 in | Wt 207.0 lb

## 2012-12-22 DIAGNOSIS — R05 Cough: Secondary | ICD-10-CM

## 2012-12-22 DIAGNOSIS — R911 Solitary pulmonary nodule: Secondary | ICD-10-CM

## 2012-12-22 DIAGNOSIS — R059 Cough, unspecified: Secondary | ICD-10-CM

## 2012-12-22 DIAGNOSIS — R053 Chronic cough: Secondary | ICD-10-CM

## 2012-12-22 DIAGNOSIS — K219 Gastro-esophageal reflux disease without esophagitis: Secondary | ICD-10-CM | POA: Insufficient documentation

## 2012-12-22 DIAGNOSIS — R0602 Shortness of breath: Secondary | ICD-10-CM

## 2012-12-22 MED ORDER — OMEPRAZOLE 20 MG PO CPDR: 20.0000 mg | DELAYED_RELEASE_CAPSULE | Freq: Every day | ORAL | Status: DC

## 2012-12-22 NOTE — Assessment & Plan Note (Signed)
May be related to untreated GERD, vs pulmonary finding, small lung nodules also seen in 2012 CXR

## 2012-12-22 NOTE — Patient Instructions (Addendum)
Chest xray to be set up  Pulmonary Function test to be set up  Start prilosec once a day for heartburn F/U sept for your physical

## 2012-12-22 NOTE — Assessment & Plan Note (Signed)
Trial of PPI. 

## 2012-12-22 NOTE — Progress Notes (Signed)
  Subjective:    Patient ID: [name], male    DOB: 1946/04/25, 67 y.o.   MRN: [id number]  HPI  Patient presents with shortness of breath and cough with production for the past 6 months. He's worried about COPD. He has a remote history of tobacco use which he quit in 1991. He occasionally feels pressure on his chest when he is overexerting himself. If he tries to put grassy down and walks backwards and forth across his yard he is short of breath. He denies any chest pain. He had a cardiac stress test in 2012 which was normal he also had an ejection fraction of 70%. He denies any nausea vomiting dizziness headache. He doesn't it to some acid reflux and heartburn symptoms are the past 3 months. He denies any leg swelling.   Review of Systems  GEN- denies fatigue, fever, weight loss,weakness, recent illness HEENT- denies eye drainage, change in vision, nasal discharge, CVS- denies chest pain, palpitations RESP- + SOB,+ cough,denies  wheeze ABD- denies N/V, change in stools, abd pain GU- denies dysuria, hematuria, dribbling, incontinence MSK- denies joint pain, muscle aches, injury Neuro- denies headache, dizziness, syncope, seizure activity      Objective:   Physical Exam   GEN- NAD, alert and oriented x3 HEENT- PERRL, EOMI, non injected sclera, pink conjunctiva, MMM, oropharynx clear Neck- Supple, no JVD CVS- RRR, no murmur RESP- mild crackles RIght base, no wheeze, no rhonchi, normal WOB ABD-NABS,soft,NT,ND, EXT- No edema Pulses- Radial, DP- 2+       Assessment & Plan:

## 2012-12-22 NOTE — Assessment & Plan Note (Signed)
Worse with exertion, previous smoker, obtain CXR, PFT, has had extensive cardiac work-up which has been negative

## 2012-12-22 NOTE — Assessment & Plan Note (Signed)
Repeat CXR, may need CT of chest

## 2013-01-13 ENCOUNTER — Ambulatory Visit
Admission: RE | Admit: 2013-01-13 | Discharge: 2013-01-13 | Disposition: A | Discharge status: Home / Self Care | Payer: 59 | Source: Ambulatory Visit | Attending: Family Medicine | Admitting: Family Medicine

## 2013-01-13 ENCOUNTER — Ambulatory Visit (HOSPITAL_COMMUNITY)
Admission: RE | Admit: 2013-01-13 | Discharge: 2013-01-13 | Disposition: A | Discharge status: Home / Self Care | Payer: Medicare Other | Source: Ambulatory Visit | Attending: Family Medicine | Admitting: Family Medicine

## 2013-01-13 DIAGNOSIS — R0602 Shortness of breath: Secondary | ICD-10-CM

## 2013-01-13 DIAGNOSIS — R911 Solitary pulmonary nodule: Secondary | ICD-10-CM

## 2013-01-13 DIAGNOSIS — R05 Cough: Secondary | ICD-10-CM

## 2013-01-13 DIAGNOSIS — R059 Cough, unspecified: Secondary | ICD-10-CM | POA: Insufficient documentation

## 2013-01-13 MED ORDER — ALBUTEROL SULFATE (5 MG/ML) 0.5% IN NEBU
2.5000 mg | INHALATION_SOLUTION | Freq: Once | RESPIRATORY_TRACT | Status: AC
  Administered 2013-01-13: 2.5 mg via RESPIRATORY_TRACT

## 2013-01-27 ENCOUNTER — Telehealth: Payer: Self-pay | Admitting: Family Medicine

## 2013-01-27 NOTE — Telephone Encounter (Signed)
Pt aware of results 

## 2013-03-15 ENCOUNTER — Other Ambulatory Visit: Payer: Self-pay | Admitting: Family Medicine

## 2013-03-15 ENCOUNTER — Encounter: Payer: Self-pay | Admitting: Physician Assistant

## 2013-03-15 ENCOUNTER — Ambulatory Visit (INDEPENDENT_AMBULATORY_CARE_PROVIDER_SITE_OTHER): Payer: Medicare Other | Admitting: Physician Assistant

## 2013-03-15 VITALS — BP 118/76 | HR 60 | Temp 97.9°F | Resp 18 | Ht 67.75 in | Wt 201.0 lb

## 2013-03-15 DIAGNOSIS — F329 Major depressive disorder, single episode, unspecified: Secondary | ICD-10-CM

## 2013-03-15 DIAGNOSIS — F3289 Other specified depressive episodes: Secondary | ICD-10-CM

## 2013-03-15 DIAGNOSIS — J309 Allergic rhinitis, unspecified: Secondary | ICD-10-CM

## 2013-03-15 DIAGNOSIS — G2581 Restless legs syndrome: Secondary | ICD-10-CM

## 2013-03-15 DIAGNOSIS — T7840XS Allergy, unspecified, sequela: Secondary | ICD-10-CM

## 2013-03-15 DIAGNOSIS — K219 Gastro-esophageal reflux disease without esophagitis: Secondary | ICD-10-CM

## 2013-03-15 DIAGNOSIS — E559 Vitamin D deficiency, unspecified: Secondary | ICD-10-CM

## 2013-03-15 DIAGNOSIS — R059 Cough, unspecified: Secondary | ICD-10-CM

## 2013-03-15 DIAGNOSIS — R05 Cough: Secondary | ICD-10-CM

## 2013-03-15 DIAGNOSIS — E785 Hyperlipidemia, unspecified: Secondary | ICD-10-CM

## 2013-03-15 DIAGNOSIS — R319 Hematuria, unspecified: Secondary | ICD-10-CM

## 2013-03-15 LAB — COMPLETE METABOLIC PANEL WITH GFR
Albumin: 4.3 g/dL (ref 3.5–5.2)
Alkaline Phosphatase: 96 U/L (ref 39–117)
BUN: 22 mg/dL (ref 6–23)
CO2: 23 mEq/L (ref 19–32)
GFR, Est African American: 62 mL/min
GFR, Est Non African American: 53 mL/min — ABNORMAL LOW
Glucose, Bld: 108 mg/dL — ABNORMAL HIGH (ref 70–99)
Potassium: 5.5 mEq/L — ABNORMAL HIGH (ref 3.5–5.3)

## 2013-03-15 LAB — CBC WITH DIFFERENTIAL/PLATELET
Basophils Relative: 1 % (ref 0–1)
Eosinophils Absolute: 0.1 10*3/uL (ref 0.0–0.7)
HCT: 42.5 % (ref 39.0–52.0)
Hemoglobin: 14.6 g/dL (ref 13.0–17.0)
MCH: 29.7 pg (ref 26.0–34.0)
MCHC: 34.4 g/dL (ref 30.0–36.0)
Monocytes Absolute: 0.4 10*3/uL (ref 0.1–1.0)
Monocytes Relative: 8 % (ref 3–12)
Neutro Abs: 2.8 10*3/uL (ref 1.7–7.7)

## 2013-03-15 LAB — URINALYSIS, MICROSCOPIC ONLY: Casts: NONE SEEN

## 2013-03-15 LAB — URINALYSIS, ROUTINE W REFLEX MICROSCOPIC
Bilirubin Urine: NEGATIVE
Leukocytes, UA: NEGATIVE
Nitrite: NEGATIVE
Protein, ur: 30 mg/dL — AB
Specific Gravity, Urine: 1.025 (ref 1.005–1.030)
Urobilinogen, UA: 0.2 mg/dL (ref 0.0–1.0)

## 2013-03-15 LAB — LIPID PANEL
Cholesterol: 169 mg/dL (ref 0–200)
Total CHOL/HDL Ratio: 4.6 Ratio

## 2013-03-15 LAB — TSH: TSH: 1.709 u[IU]/mL (ref 0.350–4.500)

## 2013-03-15 MED ORDER — ALFUZOSIN HCL ER 10 MG PO TB24: 10.0000 mg | ORAL_TABLET | Freq: Every day | ORAL | Status: DC

## 2013-03-15 MED ORDER — CITALOPRAM HYDROBROMIDE 20 MG PO TABS: 20.0000 mg | ORAL_TABLET | Freq: Every day | ORAL | Status: DC

## 2013-03-15 MED ORDER — CLONAZEPAM 0.5 MG PO TABS: 0.5000 mg | ORAL_TABLET | Freq: Every day | ORAL | Status: DC

## 2013-03-15 MED ORDER — ROPINIROLE HCL 3 MG PO TABS: 3.0000 mg | ORAL_TABLET | Freq: Every day | ORAL | Status: DC

## 2013-03-15 MED ORDER — EZETIMIBE 10 MG PO TABS: 10.0000 mg | ORAL_TABLET | Freq: Every day | ORAL | Status: DC

## 2013-03-15 MED ORDER — OMEPRAZOLE 20 MG PO CPDR: 20.0000 mg | DELAYED_RELEASE_CAPSULE | Freq: Every day | ORAL | Status: DC

## 2013-03-15 MED ORDER — FLUTICASONE PROPIONATE 50 MCG/ACT NA SUSP: 2.0000 | Freq: Every day | NASAL | Status: DC

## 2013-03-15 MED ORDER — ESZOPICLONE 1 MG PO TABS: 1.0000 mg | ORAL_TABLET | Freq: Every day | ORAL | Status: DC | PRN

## 2013-03-15 NOTE — Progress Notes (Signed)
Patient ID: [name] MRN: [id number], DOB: 12-25-1945 67 y.o. Date of Encounter: 03/15/2013, 5:51 PM    Chief Complaint: Physical (CPE)  HPI: 67 y.o. y/o white male here for CPE.   He reports that since his last office visit with me he did see Dr. Jeanice Lim on 12/22/2012. He saw her for evaluation of cough which he had for approximately 6 months. He subsequently had a chest x-ray which was normal. As well he is on Prilosec for GERD. He says he continues to have mucus in his throat every morning and make a tachycardia cough. He is on Flonase but no other medications for it he for congestion.  He also reports that on 03/05/2013 he saw blood in his urine. The following day on 03/06/2013 he went to an urgent care. He says that at that time and the urine had a very small amount of blood. He was told to just monitor her this. He reports he has had no further gross hematuria. However he does want [place] to recheck a urinalysis today to see if there's any microscopic hematuria present. If so, he is agreeable to followup with urology. He has had no pain to suggest a kidney stone. As well no pelvic pain or dysuria to suggest prostatitis/UTI.  He states that the psychiatrist he was seen is no longer taking his insurance. He has a history of OCD/anxiety which is completely stable on current medications and well controled.   No other complaints today. He needs to do a wellness exam for his insurance.   Review of Systems: Consitutional: No fever, chills, fatigue, night sweats, lymphadenopathy, or weight changes. Eyes: No visual changes, eye redness, or discharge. ENT/Mouth: Ears: No otalgia, tinnitus, hearing loss, discharge. Nose: No congestion, rhinorrhea, sinus pain, or epistaxis. Throat: No sore throat, post nasal drip, or teeth pain. Cardiovascular: No CP, palpitations, diaphoresis, DOE, edema, orthopnea, PND. Respiratory: No cough, hemoptysis, SOB, or wheezing. Gastrointestinal: No anorexia, dysphagia,  reflux, pain, nausea, vomiting, hematemesis, diarrhea, constipation, BRBPR, or melena. Genitourinary: No dysuria, frequency, urgency, hematuria, incontinence, nocturia, decreased urinary stream, discharge, impotence, or testicular pain/masses. Musculoskeletal: No decreased ROM, myalgias, stiffness, joint swelling, or weakness. Skin: No rash, erythema, lesion changes, pain, warmth, jaundice, or pruritis. Neurological: No headache, dizziness, syncope, seizures, tremors, memory loss, coordination problems, or paresthesias. Psychological: No anxiety, depression, hallucinations, SI/HI. Endocrine: No fatigue, polydipsia, polyphagia, polyuria, or known diabetes. All other systems were reviewed and are otherwise negative.  Past Medical History  Diagnosis Date  . Bladder rupture   . Allergy   . Depression   . Hyperlipidemia   . Restless leg syndrome, controlled      Past Surgical History  Procedure Laterality Date  . Appendectomy    . Fracture surgery Right 01/05/2005    lower leg/ankle  . Nasal septum surgery    . Tonsillectomy and adenoidectomy      Home Meds:  Current Outpatient Prescriptions on File Prior to Visit  Medication Sig Dispense Refill  . aspirin 81 MG tablet Take 81 mg by mouth daily.      . fish oil-omega-3 fatty acids 1000 MG capsule Take 2 g by mouth daily.       No current facility-administered medications on file prior to visit.    Allergies:  Allergies  Allergen Reactions  . Zocor [Simvastatin] Other (See Comments)    myalgia    History   Social History  . Marital Status: Married    Spouse Name: N/A  Number of Children: N/A  . Years of Education: N/A   Occupational History  . Not on file.   Social History Main Topics  . Smoking status: Former Smoker    Quit date: 07/24/1989  . Smokeless tobacco: Not on file  . Alcohol Use: Not on file  . Drug Use: Not on file  . Sexual Activity: Not on file   Other Topics Concern  . Not on file   Social  History Narrative   Engineer with [employer]   No exercise except work    Family History  Problem Relation Age of Onset  . Cancer Mother     ovarian  . Cirrhosis Father   . Alcohol abuse Father     Physical Exam: Blood pressure 118/76, pulse 60, temperature 97.9 F (36.6 C), temperature source Oral, resp. rate 18, height 5' 7.75" (1.721 m), weight 201 lb (91.173 kg).  General: Well developed, well nourished, white male .in no acute distress. HEENT: Normocephalic, atraumatic. Conjunctiva pink, sclera non-icteric. Pupils 2 mm constricting to 1 mm, round, regular, and equally reactive to light and accomodation. EOMI. Internal auditory canal clear. TMs with good cone of light and without pathology. Nasal mucosa pink. Nares are without discharge. No sinus tenderness. Oral mucosa pink. Dentition. Pharynx without exudate.   Neck: Supple. Trachea midline. No thyromegaly. Full ROM. No lymphadenopathy. No carotid bruit Lungs: Clear to auscultation bilaterally without wheezes, rales, or rhonchi. Breathing is of normal effort and unlabored. Cardiovascular: RRR with S1 S2. No murmurs, rubs, or gallops. Distal pulses 2+ symmetrically. No carotid or abdominal bruits. Abdomen: Soft, non-tender, non-distended with normoactive bowel sounds. No hepatosplenomegaly or masses. No rebound/guarding. No CVA tenderness. No hernias. Rectal: No external hemorrhoids or fissures. Rectal vault without masses. Prostate gland firm and smooth. No nodularity, tenderness, mass, or induration.  Musculoskeletal: Full range of motion and 5/5 strength throughout. Without swelling, atrophy, tenderness, crepitus, or warmth. Extremities without clubbing, cyanosis, or edema. Calves supple. Skin: Warm and moist without erythema, ecchymosis, wounds, or rash. Neuro: A+Ox3. CN II-XII grossly intact. Moves all extremities spontaneously. Full sensation throughout. Normal gait. DTR 2+ throughout upper and lower extremities. Finger to nose  intact. Psych:  Responds to questions appropriately with a normal affect.   Assessment/Plan:  67 y.o. y/o white male here for CPE -1. Allergy, sequela Continue Flonase. Add Zyrtec daily. If drainage does not resolve then followup with me and we can add Singulair.  2. Chronic cough This is consistent with being secondary to postnasal drip. See #1 above.  He is on no ACE inhibitor to be causing the hacky cough.  3. GERD (gastroesophageal reflux disease) Controlled with current medication.  4. Hyperlipidemia He has history of intolerance to multiple statins. He is on zetia. We'll recheck lab on this medication at this time - COMPLETE METABOLIC PANEL WITH GFR - Lipid panel  5. Depression/ anxiety/OCD  See history of present illness. He is stable on current medications and will continue current medications.  - TSH  6. Restless leg syndrome, controlled Controlled with current dose of Requip.  7. Hematuria See history of present illness. If hematuria persists will refer to urology for evaluation. - CBC with Differential - PSA, Medicare - Urinalysis, Routine w reflex microscopic  8. Vitamin D deficiency - Vit D  25 hydroxy (rtn osteoporosis monitoring)   A. Screening Labs: See above.  B. Screening For Prostate Cancer: Check PSA now see above.  C. Screening For Colorectal Cancer: He had this in November  2010. Repeat 10 years.  D. Immunizations: Flu Tetanus this was given 1999 Pneumococcal  he can wait and get this at age 56. Zostaax given here July 2010  Regular office visit and labs in 6 months or sooner if needed.  Signed:   [address] [place], [place]  03/15/2013 5:51 PM

## 2013-03-16 LAB — VITAMIN D 25 HYDROXY (VIT D DEFICIENCY, FRACTURES): Vit D, 25-Hydroxy: 30 ng/mL (ref 30–89)

## 2013-03-18 ENCOUNTER — Telehealth: Payer: Self-pay | Admitting: Family Medicine

## 2013-03-18 DIAGNOSIS — R319 Hematuria, unspecified: Secondary | ICD-10-CM

## 2013-03-18 NOTE — Telephone Encounter (Signed)
Pt returned my mess about lab results.  Still with hematuria, provider would like for him to see Urologist.  Pt agreeable, referral ordered.  All other labs normal, continue current medications and diet.

## 2013-03-18 NOTE — Telephone Encounter (Signed)
Message copied by Donne Anon on Thu Mar 18, 2013  4:48 PM ------      Message from: Allayne Butcher      Created: Tue Mar 16, 2013 11:10 AM       1- At OV pt reported that he saw hematuria on 03/05/13. He went to [facility] to eval this on 03/06/13. At OV he wanted to recheck. Tell him there IS still some blood in his urine. WILL DO UROLOGY REFERRAL-Kim please order this.      --Tell him his PSA is normal.      2. He is on Zetia for Cholesterol. Tell him his cholesterol looks very good. Cont Zetia and diet .      Other labs are ok.       Tell him Hgb/Hct are normal-no anemia sec to hematuria so  He has not lost large amount of blood with this hematuria. ------

## 2013-04-16 ENCOUNTER — Other Ambulatory Visit: Payer: Self-pay | Admitting: Urology

## 2013-04-19 ENCOUNTER — Encounter (HOSPITAL_COMMUNITY): Payer: Self-pay | Admitting: Pharmacy Technician

## 2013-04-19 ENCOUNTER — Other Ambulatory Visit (HOSPITAL_COMMUNITY): Payer: Self-pay | Admitting: Urology

## 2013-04-19 NOTE — Patient Instructions (Addendum)
20 WYN NETTLE  04/19/2013   Your procedure is scheduled on: 04-21-2013  Report to [facility] at 830  AM.  Call this number if you have problems the morning of surgery [phone]   Remember:   Do not eat food or drink liquids :After Midnight.     Take these medicines the morning of surgery with A SIP OF WATER: celexa, flonase, prilosec                                SEE [place] PREPARING FOR SURGERY SHEET             You may not have any metal on your body including hair pins and piercings  Do not wear jewelry, make-up.  Do not wear lotions, powders, or perfumes. You may wear deodorant.   Men may shave face and neck.  Do not bring valuables to the hospital. [facility] IS NOT RESPONSIBLE FOR VALUEABLES.  Contacts, dentures or bridgework may not be worn into surgery.  Leave suitcase in the car. After surgery it may be brought to your room.  For patients admitted to the hospital, checkout time is 11:00 AM the day of discharge.   Patients discharged the day of surgery will not be allowed to drive home.  Name and phone number of your driver:wife Olney Sink use pt cell [phone]  Special Instructions: N/A   Please read over the following fact sheets that you were given:   Call Cain Sieve RN pre op nurse if needed [phone]    FAILURE TO FOLLOW THESE INSTRUCTIONS MAY RESULT IN THE CANCELLATION OF YOUR SURGERY.  PATIENT SIGNATURE___________________________________________  NURSE SIGNATURE_____________________________________________

## 2013-04-19 NOTE — Progress Notes (Signed)
Chest xray 01-17-13 epic

## 2013-04-20 ENCOUNTER — Encounter (HOSPITAL_COMMUNITY): Payer: Self-pay

## 2013-04-20 ENCOUNTER — Encounter (HOSPITAL_COMMUNITY)
Admission: RE | Admit: 2013-04-20 | Discharge: 2013-04-20 | Disposition: A | Discharge status: Home / Self Care | Payer: Medicare Other | Source: Ambulatory Visit | Attending: Urology | Admitting: Urology

## 2013-04-20 HISTORY — DX: Neoplasm of unspecified behavior of bladder: D49.4

## 2013-04-20 LAB — CBC
HCT: 42.1 % (ref 39.0–52.0)
Hemoglobin: 14.4 g/dL (ref 13.0–17.0)
MCHC: 34.2 g/dL (ref 30.0–36.0)
MCV: 88.3 fL (ref 78.0–100.0)
RBC: 4.77 MIL/uL (ref 4.22–5.81)
WBC: 10 10*3/uL (ref 4.0–10.5)

## 2013-04-21 ENCOUNTER — Encounter (HOSPITAL_COMMUNITY): Payer: Medicare Other | Admitting: Anesthesiology

## 2013-04-21 ENCOUNTER — Ambulatory Visit (HOSPITAL_COMMUNITY): Payer: Medicare Other | Admitting: Anesthesiology

## 2013-04-21 ENCOUNTER — Encounter (HOSPITAL_COMMUNITY)
Admission: RE | Disposition: A | Discharge status: Home / Self Care | Payer: Self-pay | Source: Ambulatory Visit | Attending: Urology

## 2013-04-21 ENCOUNTER — Ambulatory Visit (HOSPITAL_COMMUNITY)
Admission: RE | Admit: 2013-04-21 | Discharge: 2013-04-21 | Disposition: A | Discharge status: Home / Self Care | Payer: Medicare Other | Source: Ambulatory Visit | Attending: Urology | Admitting: Urology

## 2013-04-21 ENCOUNTER — Encounter (HOSPITAL_COMMUNITY): Payer: Self-pay | Admitting: *Deleted

## 2013-04-21 DIAGNOSIS — C679 Malignant neoplasm of bladder, unspecified: Secondary | ICD-10-CM | POA: Insufficient documentation

## 2013-04-21 DIAGNOSIS — D494 Neoplasm of unspecified behavior of bladder: Secondary | ICD-10-CM

## 2013-04-21 DIAGNOSIS — K219 Gastro-esophageal reflux disease without esophagitis: Secondary | ICD-10-CM | POA: Insufficient documentation

## 2013-04-21 DIAGNOSIS — R310 Gross hematuria: Secondary | ICD-10-CM | POA: Insufficient documentation

## 2013-04-21 HISTORY — PX: TRANSURETHRAL RESECTION OF BLADDER TUMOR WITH GYRUS (TURBT-GYRUS): SHX6458

## 2013-04-21 HISTORY — PX: CYSTOSCOPY W/ RETROGRADES: SHX1426

## 2013-04-21 SURGERY — TRANSURETHRAL RESECTION OF BLADDER TUMOR WITH GYRUS (TURBT-GYRUS)
Anesthesia: General | Site: Ureter | Wound class: Clean Contaminated

## 2013-04-21 MED ORDER — PROMETHAZINE HCL 25 MG/ML IJ SOLN: 6.2500 mg | INTRAMUSCULAR | Status: DC | PRN

## 2013-04-21 MED ORDER — ONDANSETRON HCL 4 MG/2ML IJ SOLN
INTRAMUSCULAR | Status: DC | PRN
  Administered 2013-04-21: 4 mg via INTRAMUSCULAR

## 2013-04-21 MED ORDER — FENTANYL CITRATE 0.05 MG/ML IJ SOLN: 25.0000 ug | INTRAMUSCULAR | Status: DC | PRN

## 2013-04-21 MED ORDER — MIDAZOLAM HCL 5 MG/5ML IJ SOLN
INTRAMUSCULAR | Status: DC | PRN
  Administered 2013-04-21: 2 mg via INTRAVENOUS

## 2013-04-21 MED ORDER — SODIUM CHLORIDE 0.9 % IR SOLN
Status: DC | PRN
  Administered 2013-04-21 (×5): 3000 mL via INTRAVESICAL

## 2013-04-21 MED ORDER — LIDOCAINE HCL (CARDIAC) 20 MG/ML IV SOLN
INTRAVENOUS | Status: DC | PRN
  Administered 2013-04-21: 50 mg via INTRAVENOUS

## 2013-04-21 MED ORDER — LIDOCAINE HCL 2 % EX GEL
CUTANEOUS | Status: AC
  Filled 2013-04-21: qty 10

## 2013-04-21 MED ORDER — OXYBUTYNIN CHLORIDE 5 MG PO TABS: 5.0000 mg | ORAL_TABLET | Freq: Four times a day (QID) | ORAL | Status: DC | PRN

## 2013-04-21 MED ORDER — LACTATED RINGERS IV SOLN
INTRAVENOUS | Status: DC | PRN
  Administered 2013-04-21: 11:00:00 via INTRAVENOUS

## 2013-04-21 MED ORDER — LIDOCAINE HCL 2 % EX GEL
CUTANEOUS | Status: DC | PRN
  Administered 2013-04-21: 1

## 2013-04-21 MED ORDER — HYOSCYAMINE SULFATE 0.125 MG PO TABS: 0.1250 mg | ORAL_TABLET | ORAL | Status: DC | PRN

## 2013-04-21 MED ORDER — SODIUM CHLORIDE 0.9 % IV SOLN
INTRAVENOUS | Status: DC | PRN
  Administered 2013-04-21: 12:00:00 via INTRAVENOUS

## 2013-04-21 MED ORDER — BELLADONNA ALKALOIDS-OPIUM 16.2-60 MG RE SUPP
RECTAL | Status: AC
  Filled 2013-04-21: qty 1

## 2013-04-21 MED ORDER — OXYCODONE-ACETAMINOPHEN 5-325 MG PO TABS: 1.0000 | ORAL_TABLET | ORAL | Status: DC | PRN

## 2013-04-21 MED ORDER — CEFAZOLIN SODIUM-DEXTROSE 2-3 GM-% IV SOLR
2.0000 g | INTRAVENOUS | Status: AC
  Administered 2013-04-21: 2 g via INTRAVENOUS

## 2013-04-21 MED ORDER — PHENAZOPYRIDINE HCL 100 MG PO TABS: 100.0000 mg | ORAL_TABLET | Freq: Three times a day (TID) | ORAL | Status: DC | PRN

## 2013-04-21 MED ORDER — KETOROLAC TROMETHAMINE 30 MG/ML IJ SOLN: 15.0000 mg | Freq: Once | INTRAMUSCULAR | Status: DC | PRN

## 2013-04-21 MED ORDER — CEFAZOLIN SODIUM-DEXTROSE 2-3 GM-% IV SOLR
INTRAVENOUS | Status: AC
  Filled 2013-04-21: qty 50

## 2013-04-21 MED ORDER — SENNOSIDES-DOCUSATE SODIUM 8.6-50 MG PO TABS: 1.0000 | ORAL_TABLET | Freq: Two times a day (BID) | ORAL | Status: DC

## 2013-04-21 MED ORDER — PROPOFOL 10 MG/ML IV BOLUS
INTRAVENOUS | Status: DC | PRN
  Administered 2013-04-21: 200 mg via INTRAVENOUS

## 2013-04-21 MED ORDER — LACTATED RINGERS IV SOLN
INTRAVENOUS | Status: DC
  Administered 2013-04-21: 1000 mL via INTRAVENOUS

## 2013-04-21 MED ORDER — CEPHALEXIN 500 MG PO CAPS: 500.0000 mg | ORAL_CAPSULE | Freq: Two times a day (BID) | ORAL | Status: DC

## 2013-04-21 MED ORDER — BELLADONNA ALKALOIDS-OPIUM 16.2-60 MG RE SUPP
RECTAL | Status: DC | PRN
  Administered 2013-04-21: 1 via RECTAL

## 2013-04-21 MED ORDER — KETOROLAC TROMETHAMINE 30 MG/ML IJ SOLN
INTRAMUSCULAR | Status: DC | PRN
  Administered 2013-04-21: 30 mg via INTRAVENOUS

## 2013-04-21 MED ORDER — FENTANYL CITRATE 0.05 MG/ML IJ SOLN
INTRAMUSCULAR | Status: DC | PRN
  Administered 2013-04-21: 25 ug via INTRAVENOUS
  Administered 2013-04-21: 175 ug via INTRAVENOUS

## 2013-04-21 SURGICAL SUPPLY — 22 items
BAG URINE DRAINAGE (UROLOGICAL SUPPLIES) ×1 IMPLANT
BAG URO CATCHER STRL LF (DRAPE) ×3 IMPLANT
CATH FOLEY 2WAY 5CC 20FR (CATHETERS) ×1 IMPLANT
CATH URET 5FR 28IN OPEN ENDED (CATHETERS) ×1 IMPLANT
CLOTH BEACON ORANGE TIMEOUT ST (SAFETY) ×3 IMPLANT
DRAPE CAMERA CLOSED 9X96 (DRAPES) ×3 IMPLANT
ELECT LOOP MED HF 24F 12D CBL (CLIP) ×1 IMPLANT
ELECT REM PT RETURN 9FT ADLT (ELECTROSURGICAL) ×3
ELECTRODE REM PT RTRN 9FT ADLT (ELECTROSURGICAL) ×2 IMPLANT
EVACUATOR MICROVAS BLADDER (UROLOGICAL SUPPLIES) IMPLANT
GLOVE BIOGEL M 7.0 STRL (GLOVE) IMPLANT
GLOVE BIOGEL PI IND STRL 7.5 (GLOVE) ×4 IMPLANT
GLOVE BIOGEL PI INDICATOR 7.5 (GLOVE) ×2
GLOVE ECLIPSE 7.0 STRL STRAW (GLOVE) ×3 IMPLANT
KIT ASPIRATION TUBING (SET/KITS/TRAYS/PACK) IMPLANT
LOOPS RESECTOSCOPE DISP (ELECTROSURGICAL) ×3 IMPLANT
MANIFOLD NEPTUNE II (INSTRUMENTS) ×3 IMPLANT
PACK CYSTO (CUSTOM PROCEDURE TRAY) ×3 IMPLANT
SCRUB PCMX 4 OZ (MISCELLANEOUS) ×1 IMPLANT
SYRINGE 10CC LL (SYRINGE) ×1 IMPLANT
SYRINGE IRR TOOMEY STRL 70CC (SYRINGE) ×1 IMPLANT
TUBING CONNECTING 10 (TUBING) ×3 IMPLANT

## 2013-04-21 NOTE — Transfer of Care (Signed)
Immediate Anesthesia Transfer of Care Note  Patient: [name]  Procedure(s) Performed: Procedure(s): TRANSURETHRAL RESECTION OF BLADDER TUMOR WITH GYRUS (TURBT-GYRUS), cystoscopy (N/A) CYSTOSCOPY WITH RETROGRADE PYELOGRAM (Bilateral)  Patient Location: PACU  Anesthesia Type:General  Level of Consciousness: awake and alert   Airway & Oxygen Therapy: Patient Spontanous Breathing and Patient connected to face mask oxygen  Post-op Assessment: Report given to PACU RN and Post -op Vital signs reviewed and stable  Post vital signs: Reviewed and stable  Complications: No apparent anesthesia complications

## 2013-04-21 NOTE — Progress Notes (Signed)
Foley care c emptying  Done c pt and wife  Leg bag exchange taught to pt,wife  As pt states prefers to wear leg bag when he is out in public to return to md office  Verbalizes understanding

## 2013-04-21 NOTE — Op Note (Signed)
Urology Operative Report  Date of Procedure: 04/21/13  Surgeon: Natalia Leatherwood, MD Assistant:  None  Preoperative Diagnosis: Bladder tumor. Gross hematuria. Postoperative Diagnosis:  Same  Procedure(s): Transurethral resection of bladder tumor (medium size >2 cm but <5 cm) Bilateral retrograde pyelograms with interpretation. Cystoscopy.  Estimated blood loss: Minimal  Specimen: Bladder tumor. Bladder trigone.  Drains: 20Fr foley.  Complications: None  Findings: Bladder tumor larger than 2 cm and less than 5 cm on the anterior bladder dome. Erythema of the bladder trigone. Negative filling defects of the ureters bilaterally. Patulous ureter orifices bilaterally.  History of present illness: 67 year old male presented with gross hematuria. He was discovered to have a bladder tumor. He presents today for transurethral resection of bladder tumor and bilateral retrograde pyelograms.   Procedure in detail: After informed consent was obtained, the patient was taken to the operating room. They were placed in the supine position. SCDs were turned on and in place. IV antibiotics were infused, and general anesthesia was induced. A timeout was performed in which the correct patient, surgical site, and procedure were identified and agreed upon by the team.  The patient was placed in a dorsolithotomy position, making sure to pad all pertinent neurovascular pressure points. The genitals were prepped and draped in the usual sterile fashion.  A rigid cystoscope was advanced through the urethra into the bladder. This was the visual obturator to the resectoscope sheath. The bladder was completely drained. A very large amount of urine in size bladder. This is consistent with his history of hypotonic bladder. The bladder was then completely distended and evaluated in a systematic fashion with a 30 and 70 lens. There was noted to be erythema overlying the trigone of the bladder. There is also a  medium-size bladder tumor on the anterior bladder dome which was larger than 2 cm but less than 5 cm. Both ureter orifices were divided there were noted to be patulous. The visual obturator was removed leaving the sheath in place and the resection loop was placed. Resection was carried out in a systematic fashion starting with the posterior side of the tumor and resecting anteriorly. I was careful not to perforate the bladder but I did resect into the muscle as as able to utilize muscle fibers as I was resecting. After this was done the entire surface of the resected bladder tumor as was the area around was fulgurated. The bladder tumor specimen was then removed and sent for permanent pathology.  Attention was turned to the trigone. This was biopsied with the gyrus resectoscope and cold cup biopsy forceps. The biopsy sites were then fulgurated with the gyrus. All resection was carried out in normal saline.  Bilateral retrograde PolyGram's were obtained by cannulating each ureter orifice bilaterally with a 5 French ureter catheter. I injected contrast and the subcutaneous pyelograms. Each ureter orifice was noted to be patulous the right more than the left. Each ureter was noted to be mildly tortuous and somewhat distended which could be due to reflux given his history of hypotonic bladder and patulous ureters. There is no filling defects in each side indeed out well.  I removed the cystoscope, and placed 10 cc of lidocaine jelly into the urethra. I then placed a 20 [place] coud-tip catheter with 10 cc of sterile water in the balloon.  This completed the procedure. The patient was placed back in a supine position, anesthesia was reversed, and he was taken to the PACU in stable condition.  All counts were correct at  the end of the case.

## 2013-04-21 NOTE — Anesthesia Preprocedure Evaluation (Signed)
Anesthesia Evaluation  Patient identified by MRN, date of birth, ID band Patient awake    Reviewed: Allergy & Precautions, H&P , NPO status , Patient's Chart, lab work & pertinent test results  Airway Mallampati: II TM Distance: >3 FB Neck ROM: Full    Dental no notable dental hx.    Pulmonary neg pulmonary ROS,  breath sounds clear to auscultation  Pulmonary exam normal       Cardiovascular negative cardio ROS  Rhythm:Regular Rate:Normal     Neuro/Psych negative neurological ROS  negative psych ROS   GI/Hepatic Neg liver ROS, GERD-  Medicated,  Endo/Other  negative endocrine ROS  Renal/GU negative Renal ROS  negative genitourinary   Musculoskeletal negative musculoskeletal ROS (+)   Abdominal   Peds negative pediatric ROS (+)  Hematology negative hematology ROS (+)   Anesthesia Other Findings   Reproductive/Obstetrics negative OB ROS                           Anesthesia Physical Anesthesia Plan  ASA: II  Anesthesia Plan: General   Post-op Pain Management:    Induction: Intravenous  Airway Management Planned: LMA  Additional Equipment:   Intra-op Plan:   Post-operative Plan:   Informed Consent: I have reviewed the patients History and Physical, chart, labs and discussed the procedure including the risks, benefits and alternatives for the proposed anesthesia with the patient or authorized representative who has indicated his/her understanding and acceptance.   Dental advisory given  Plan Discussed with: CRNA and Surgeon  Anesthesia Plan Comments:         Anesthesia Quick Evaluation  

## 2013-04-21 NOTE — Anesthesia Postprocedure Evaluation (Signed)
  Anesthesia Post-op Note  Patient: [name]  Procedure(s) Performed: Procedure(s) (LRB): TRANSURETHRAL RESECTION OF BLADDER TUMOR WITH GYRUS (TURBT-GYRUS), cystoscopy (N/A) CYSTOSCOPY WITH RETROGRADE PYELOGRAM (Bilateral)  Patient Location: PACU  Anesthesia Type: General  Level of Consciousness: awake and alert   Airway and Oxygen Therapy: Patient Spontanous Breathing  Post-op Pain: mild  Post-op Assessment: Post-op Vital signs reviewed, Patient's Cardiovascular Status Stable, Respiratory Function Stable, Patent Airway and No signs of Nausea or vomiting  Last Vitals:  Filed Vitals:   04/21/13 1245  BP: 133/66  Pulse: 68  Temp:   Resp: 10    Post-op Vital Signs: stable   Complications: No apparent anesthesia complications

## 2013-04-21 NOTE — H&P (Signed)
Urology History and Physical Exam  CC: Bladder tumor.   HPI: 67 year old male presents today for bladder tumor. This was discovered during workup for gross hematuria. It was seen on CT scan earlier this month. It measured 2.7 x 2.3 cm in size. It was enhancing. Cystoscopy in the clinic on 04/16/13 revealed a medium-size bladder tumor which was papillary in nature. This was concerning for bladder cancer. It was on the anterior dome of the bladder. This is been associated with gross hematuria. He does have a significant past medical history of bladder rupture and a large capacity poorly functioning bladder. We discussed management options and he presents today for cystoscopy, transurethral resection of bladder tumor, and bilateral retrograde pyelograms. We discussed the risk and benefits, alternatives, and likelihood of achieving goals. CT scan which performed hematuria protocol showed no filling defects of the ureters and this was also negative for any metastatic disease. UA on 04/16/13 was negative for signs of infection.  PMH: Past Medical History  Diagnosis Date  . Bladder rupture 2010  . Allergy   . Depression   . Hyperlipidemia   . Restless leg syndrome, controlled   . Bladder tumor     PSH: Past Surgical History  Procedure Laterality Date  . Appendectomy    . Nasal septum surgery    . Tonsillectomy and adenoidectomy    . Fracture surgery Right 01/05/2005    lower leg/ankle  . Bladder surgery  2010    Allergies: Allergies  Allergen Reactions  . Zocor [Simvastatin] Other (See Comments)    myalgia    Medications: No prescriptions prior to admission     Social History: History   Social History  . Marital Status: Married    Spouse Name: N/A    Number of Children: N/A  . Years of Education: N/A   Occupational History  . Not on file.   Social History Main Topics  . Smoking status: Former Smoker -- 0.50 packs/day for 30 years    Types: Cigarettes    Quit date:  07/24/1989  . Smokeless tobacco: Never Used  . Alcohol Use: Yes     Comment: social  . Drug Use: No  . Sexual Activity: Not on file   Other Topics Concern  . 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 Cancer Mother     ovarian  . Cirrhosis Father   . Alcohol abuse Father     Review of Systems: Positive: Weak stream. Negative: Fever, SOB, or chest pain.  A further 10 point review of systems was negative except what is listed in the HPI.  Physical Exam: Filed Vitals:   04/21/13 0836  BP: 135/70  Pulse: 76  Temp: 99.2 F (37.3 C)  Resp: 18    General: No acute distress.  Awake. Head:  Normocephalic.  Atraumatic. ENT:  EOMI.  Mucous membranes moist Neck:  Supple.  No lymphadenopathy. CV:  S1 present. S2 present. Regular rate. Pulmonary: Equal effort bilaterally.  Clear to auscultation bilaterally. Abdomen: Soft.  Non- tender to palpation. Skin:  Normal turgor.  No visible rash. Extremity: No gross deformity of bilateral upper extremities.  No gross deformity of    bilateral lower extremities. Neurologic: Alert. Appropriate mood.    Studies:  Recent Labs     04/20/13  0835  HGB  14.4  WBC  10.0  PLT  222    No results found for this basename: NA, K, CL, CO2, BUN, CREATININE, CALCIUM, MAGNESIUM, GFRNONAA, GFRAA,  in the last 72 hours   No results found for this basename: PT, INR, APTT,  in the last 72 hours   No components found with this basename: ABG,     Assessment:  Bladder tumor.  Plan: To OR  for cystoscopy, transurethral resection of bladder tumor, and bilateral retrograde pyelograms.  I have recommended leaving a foley catheter after this procedure due to his history of hypotonic bladder. He agrees.

## 2013-04-22 ENCOUNTER — Encounter (HOSPITAL_COMMUNITY): Payer: Self-pay | Admitting: Urology

## 2013-05-14 ENCOUNTER — Encounter: Payer: Self-pay | Admitting: Family Medicine

## 2013-05-14 ENCOUNTER — Ambulatory Visit (INDEPENDENT_AMBULATORY_CARE_PROVIDER_SITE_OTHER): Payer: Medicare Other | Admitting: Family Medicine

## 2013-05-14 VITALS — BP 110/68 | HR 62 | Temp 97.2°F | Resp 16 | Wt 202.0 lb

## 2013-05-14 DIAGNOSIS — R300 Dysuria: Secondary | ICD-10-CM

## 2013-05-14 LAB — BASIC METABOLIC PANEL
BUN: 21 mg/dL (ref 6–23)
Calcium: 9.4 mg/dL (ref 8.4–10.5)
Creat: 1.4 mg/dL — ABNORMAL HIGH (ref 0.50–1.35)
Glucose, Bld: 124 mg/dL — ABNORMAL HIGH (ref 70–99)
Potassium: 5.3 mEq/L (ref 3.5–5.3)

## 2013-05-14 LAB — URINALYSIS, ROUTINE W REFLEX MICROSCOPIC
Bilirubin Urine: NEGATIVE
Glucose, UA: NEGATIVE mg/dL
Ketones, ur: NEGATIVE mg/dL
Nitrite: NEGATIVE
Protein, ur: 100 mg/dL — AB
Specific Gravity, Urine: 1.025 (ref 1.005–1.030)
Urobilinogen, UA: 0.2 mg/dL (ref 0.0–1.0)

## 2013-05-14 LAB — URINALYSIS, MICROSCOPIC ONLY: Crystals: NONE SEEN

## 2013-05-14 MED ORDER — SULFAMETHOXAZOLE-TMP DS 800-160 MG PO TABS: 1.0000 | ORAL_TABLET | Freq: Two times a day (BID) | ORAL | Status: DC

## 2013-05-14 NOTE — Progress Notes (Signed)
Subjective:    Patient ID: [name], male    DOB: 1945-09-08, 67 y.o.   MRN: [id number]  HPI Patient has recently been diagnosed with a bladder tumor and underwent surgical resection. Shortly thereafter he developed dysuria frequency and foul smelling cloudy urine.  He also reports some right-sided low back pain. He denies any fevers chills nausea or vomiting. He has recently been on Cipro raising the concern for a possible resistant infection. Furthermore his last creatinine in September was 1.37 showing mild chronic kidney disease. Past Medical History  Diagnosis Date  . Bladder rupture 2010  . Allergy   . Depression   . Hyperlipidemia   . Restless leg syndrome, controlled   . Bladder tumor    Past Surgical History  Procedure Laterality Date  . Appendectomy    . Nasal septum surgery    . Tonsillectomy and adenoidectomy    . Fracture surgery Right 01/05/2005    lower leg/ankle  . Bladder surgery  2010  . Transurethral resection of bladder tumor with gyrus (turbt-gyrus) N/A 04/21/2013    Procedure: TRANSURETHRAL RESECTION OF BLADDER TUMOR WITH GYRUS (TURBT-GYRUS), cystoscopy;  Surgeon: Milford Cage, MD;  Location: [facility];  Service: Urology;  Laterality: N/A;  . Cystoscopy w/ retrogrades Bilateral 04/21/2013    Procedure: CYSTOSCOPY WITH RETROGRADE PYELOGRAM;  Surgeon: Milford Cage, MD;  Location: [facility];  Service: Urology;  Laterality: Bilateral;   Current Outpatient Prescriptions on File Prior to Visit  Medication Sig Dispense Refill  . alfuzosin (UROXATRAL) 10 MG 24 hr tablet Take 10 mg by mouth every morning.      . citalopram (CELEXA) 20 MG tablet Take 20 mg by mouth every morning.      . clonazePAM (KLONOPIN) 0.5 MG tablet Take 0.5 mg by mouth at bedtime.      . eszopiclone (LUNESTA) 1 MG TABS tablet Take 1 mg by mouth at bedtime as needed (sleep). Take immediately before bedtime      . ezetimibe (ZETIA) 10 MG tablet Take 10 mg by mouth every morning.       . fluticasone (FLONASE) 50 MCG/ACT nasal spray Place 2 sprays into the nose daily.      . hyoscyamine (LEVSIN, ANASPAZ) 0.125 MG tablet Take 1 tablet (0.125 mg total) by mouth every 4 (four) hours as needed for cramping (bladder spasms).  40 tablet  4  . omeprazole (PRILOSEC) 20 MG capsule Take 20 mg by mouth daily.      [name] oxybutynin (DITROPAN) 5 MG tablet Take 1 tablet (5 mg total) by mouth every 6 (six) hours as needed (bladder spasms).  40 tablet  4  . oxyCODONE-acetaminophen (PERCOCET) 5-325 MG per tablet Take 1-2 tablets by mouth every 4 (four) hours as needed for pain.  40 tablet  0  . rOPINIRole (REQUIP) 3 MG tablet Take 3 mg by mouth at bedtime.      . senna-docusate (SENOKOT S) 8.6-50 MG per tablet Take 1 tablet by mouth 2 (two) times daily.  60 tablet  0   No current facility-administered medications on file prior to visit.   Allergies  Allergen Reactions  . Zocor [Simvastatin] Other (See Comments)    myalgia   History   Social History  . Marital Status: Married    Spouse Name: N/A    Number of Children: N/A  . Years of Education: N/A   Occupational History  . Not on file.   Social History Main Topics  . Smoking status: Former  Smoker -- 0.50 packs/day for 30 years    Types: Cigarettes    Quit date: 07/24/1989  . Smokeless tobacco: Never Used  . Alcohol Use: Yes     Comment: social  . Drug Use: No  . Sexual Activity: Not on file   Other Topics Concern  . 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       Objective:   Physical Exam  Vitals reviewed. Cardiovascular: Normal rate, regular rhythm and normal heart sounds.   Pulmonary/Chest: Effort normal and breath sounds normal.  Abdominal: Soft. Bowel sounds are normal. He exhibits no distension. There is no tenderness. There is no rebound and no guarding.   patient has some right-sided mild  CVAT.        Assessment & Plan:  1. Dysuria His urinalysis is significant for pyuria. I'm going to start the patient on Bactrim double strength tablets 1 by mouth twice a day for 7 days. Have asked him to return next week to week and recheck a BMP. Creatinine worsens on the Bactrim we will need to switch it. I will send a urine culture. We can switch the antibiotic to another antibiotic to which the infection is sensitive to another culture results only if the creatinine begins to rise. - Urinalysis, Routine w reflex microscopic - sulfamethoxazole-trimethoprim (BACTRIM DS) 800-160 MG per tablet; Take 1 tablet by mouth 2 (two) times daily.  Dispense: 14 tablet; Refill: 0 - Basic Metabolic Panel - Urine culture

## 2013-05-15 LAB — URINE CULTURE
Colony Count: NO GROWTH
Organism ID, Bacteria: NO GROWTH

## 2013-05-19 ENCOUNTER — Other Ambulatory Visit: Payer: Medicare Other

## 2013-05-19 DIAGNOSIS — R300 Dysuria: Secondary | ICD-10-CM

## 2013-05-19 LAB — BASIC METABOLIC PANEL
BUN: 21 mg/dL (ref 6–23)
Chloride: 105 mEq/L (ref 96–112)
Creat: 1.66 mg/dL — ABNORMAL HIGH (ref 0.50–1.35)
Potassium: 5 mEq/L (ref 3.5–5.3)
Sodium: 138 mEq/L (ref 135–145)

## 2013-05-21 ENCOUNTER — Telehealth: Payer: Self-pay | Admitting: Oncology

## 2013-05-21 ENCOUNTER — Other Ambulatory Visit: Payer: Self-pay | Admitting: Oncology

## 2013-05-21 DIAGNOSIS — C679 Malignant neoplasm of bladder, unspecified: Secondary | ICD-10-CM

## 2013-05-21 NOTE — Telephone Encounter (Signed)
S/w pt and gve NP appt 11/19 @ 10:30 w/Dr. Clelia Croft.  Referring Dr. Lyla Son Dx- Bladder Ca Welcome packet mailed.

## 2013-05-24 ENCOUNTER — Telehealth: Payer: Self-pay | Admitting: Oncology

## 2013-05-24 NOTE — Telephone Encounter (Signed)
C/D 05/24/13 for appt. 05/26/13 °

## 2013-05-26 ENCOUNTER — Encounter: Payer: Self-pay | Admitting: Oncology

## 2013-05-26 ENCOUNTER — Other Ambulatory Visit (HOSPITAL_BASED_OUTPATIENT_CLINIC_OR_DEPARTMENT_OTHER): Payer: Medicare Other | Admitting: Lab

## 2013-05-26 ENCOUNTER — Ambulatory Visit: Payer: Medicare Other

## 2013-05-26 ENCOUNTER — Telehealth: Payer: Self-pay | Admitting: Oncology

## 2013-05-26 ENCOUNTER — Ambulatory Visit (HOSPITAL_BASED_OUTPATIENT_CLINIC_OR_DEPARTMENT_OTHER): Payer: Medicare Other | Admitting: Oncology

## 2013-05-26 VITALS — BP 114/70 | HR 68 | Temp 97.6°F | Resp 20 | Ht 69.0 in | Wt 201.3 lb

## 2013-05-26 DIAGNOSIS — C671 Malignant neoplasm of dome of bladder: Secondary | ICD-10-CM

## 2013-05-26 DIAGNOSIS — C679 Malignant neoplasm of bladder, unspecified: Secondary | ICD-10-CM

## 2013-05-26 DIAGNOSIS — N289 Disorder of kidney and ureter, unspecified: Secondary | ICD-10-CM

## 2013-05-26 LAB — CBC WITH DIFFERENTIAL/PLATELET
BASO%: 0.9 % (ref 0.0–2.0)
Basophils Absolute: 0.1 10*3/uL (ref 0.0–0.1)
Eosinophils Absolute: 0.1 10*3/uL (ref 0.0–0.5)
HCT: 43.1 % (ref 38.4–49.9)
HGB: 14.1 g/dL (ref 13.0–17.1)
MCHC: 32.7 g/dL (ref 32.0–36.0)
MCV: 87.6 fL (ref 79.3–98.0)
MONO%: 7 % (ref 0.0–14.0)
NEUT#: 4.4 10*3/uL (ref 1.5–6.5)
NEUT%: 70.8 % (ref 39.0–75.0)
Platelets: 252 10*3/uL (ref 140–400)
RDW: 13.2 % (ref 11.0–14.6)
lymph#: 1.2 10*3/uL (ref 0.9–3.3)

## 2013-05-26 LAB — COMPREHENSIVE METABOLIC PANEL (CC13)
Albumin: 3.7 g/dL (ref 3.5–5.0)
BUN: 22.8 mg/dL (ref 7.0–26.0)
Calcium: 9.9 mg/dL (ref 8.4–10.4)
Chloride: 108 mEq/L (ref 98–109)
Creatinine: 1.3 mg/dL (ref 0.7–1.3)
Glucose: 71 mg/dl (ref 70–140)
Potassium: 4.9 mEq/L (ref 3.5–5.1)

## 2013-05-26 MED ORDER — ONDANSETRON HCL 8 MG PO TABS: 8.0000 mg | ORAL_TABLET | Freq: Three times a day (TID) | ORAL | Status: DC | PRN

## 2013-05-26 MED ORDER — LIDOCAINE-PRILOCAINE 2.5-2.5 % EX CREA: 1.0000 "application " | TOPICAL_CREAM | CUTANEOUS | Status: DC | PRN

## 2013-05-26 NOTE — Progress Notes (Signed)
Please see consult note.  

## 2013-05-26 NOTE — Telephone Encounter (Signed)
gv and printed appt sched and avs for pt for NOV...lvm for IR to call me and pt with appt d./t

## 2013-05-26 NOTE — Consult Note (Signed)
Reason for Referral: Bladder cancer.   HPI: 67 year old male with no significant past medical history referred to me for the evaluation for bladder cancer. This was discovered during workup for gross hematuria that started in 03/2013. He was evaluated by Urology and had a CT scan in 04/2013 and showed bladder mass. It measured 2.7 x 2.3 cm in size. It was enhancing. Cystoscopy in the clinic on 04/16/13 revealed a medium-size bladder tumor which was papillary in nature. This was concerning for bladder cancer. It was on the anterior dome of the bladder. This is been associated with gross hematuria. He does have a significant past medical history of bladder rupture and a large capacity poorly functioning bladder. On 04/21/2013 he underwent transurethral resection of bladder tumor, bilateral retrograde pyelograms and Cystoscopy by Dr. Margarita Grizzle. 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 This indicating a T2 tumor at that time. He was evaluated at [facility] for cystectomy and he was recommended to receive neoadjuvant chemotherapy which he prefers to be done locally.  Clinically, he is asymptomatic at this point. He does not report any hematuria or back pain or pelvic pain at this time. He does not report any constitutional symptoms of fevers chills night sweats or weight loss. He does not report any headaches blurred vision double vision or neurological symptoms. He does not report any chest pain shortness of breath or difficulty breathing. Is that report any cough or hemoptysis or hematemesis. Does not report any constipation or diarrhea or hematochezia. Does not report any frequency urgency or hesitancy. Does not report any musculoskeletal complaints arthralgias or myalgias. Does not report any heat or cold intolerance or bleeding diathesis. That report any clotting tendencies. He is continued to have excellent performance  status and that test is activity of daily living without any hindrance or decline  Past Medical History   Diagnosis Date  . Bladder rupture 2010  . Allergy   . Depression   . Hyperlipidemia   . Restless leg syndrome, controlled   . Bladder tumor   :  Past Surgical History  Procedure Laterality Date  . Appendectomy    . Nasal septum surgery    . Tonsillectomy and adenoidectomy    . Fracture surgery Right 01/05/2005    lower leg/ankle  . Bladder surgery  2010  . Transurethral resection of bladder tumor with gyrus (turbt-gyrus) N/A 04/21/2013    Procedure: TRANSURETHRAL RESECTION OF BLADDER TUMOR WITH GYRUS (TURBT-GYRUS), cystoscopy;  Surgeon: Milford Cage, MD;  Location: [facility];  Service: Urology;  Laterality: N/A;  . Cystoscopy w/ retrogrades Bilateral 04/21/2013    Procedure: CYSTOSCOPY WITH RETROGRADE PYELOGRAM;  Surgeon: Milford Cage, MD;  Location: [facility];  Service: Urology;  Laterality: Bilateral;  :  Current Outpatient Prescriptions  Medication Sig Dispense Refill  . alfuzosin (UROXATRAL) 10 MG 24 hr tablet Take 10 mg by mouth every morning.      [name] aspirin 81 MG tablet Take 81 mg by mouth daily.      . citalopram (CELEXA) 20 MG tablet Take 20 mg by mouth every morning.      . clonazePAM (KLONOPIN) 0.5 MG tablet Take 0.5 mg by mouth at bedtime.      . eszopiclone (LUNESTA) 1 MG TABS tablet Take 1 mg by mouth at bedtime as needed (sleep). Take immediately before bedtime      . ezetimibe (ZETIA) 10 MG tablet Take 10 mg by mouth  every morning.      . fluticasone (FLONASE) 50 MCG/ACT nasal spray Place 2 sprays into the nose daily.      . Omega-3 Fatty Acids (FISH OIL) 1200 MG CAPS Take 1,200 mg by mouth 2 (two) times daily.      [name] omeprazole (PRILOSEC) 20 MG capsule Take 20 mg by mouth daily.      [name] rOPINIRole (REQUIP) 3 MG tablet Take 3 mg by mouth at bedtime.      . lidocaine-prilocaine (EMLA) cream Apply 1 application topically as needed. Apply to port  before chemotherapy.  30 g  0  . ondansetron (ZOFRAN) 8 MG tablet Take 1 tablet (8 mg total) by mouth every 8 (eight) hours as needed for nausea or vomiting.  20 tablet  0   No current facility-administered medications for this visit.       Allergies  Allergen Reactions  . Zocor [Simvastatin] Other (See Comments)    myalgia  :  Family History  Problem Relation Age of Onset  . Cancer Mother     ovarian  . Cirrhosis Father   . Alcohol abuse Father   :  History   Social History  . Marital Status: Married    Spouse Name: N/A    Number of Children: N/A  . Years of Education: N/A   Occupational History  . Not on file.   Social History Main Topics  . Smoking status: Former Smoker -- 0.50 packs/day for 30 years    Types: Cigarettes    Quit date: 07/24/1989  . Smokeless tobacco: Never Used  . Alcohol Use: Yes     Comment: social  . Drug Use: No  . Sexual Activity: Not on file   Other Topics Concern  . 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  Exam: ECOG 0 Blood pressure 114/70, pulse 68, temperature 97.6 F (36.4 C), temperature source Oral, resp. rate 20, height 5\' 9"  (1.753 m), weight 201 lb 4.8 oz (91.309 kg). General appearance: alert, cooperative and appears stated age Head: Normocephalic, without obvious abnormality, atraumatic Throat: lips, mucosa, and tongue normal; teeth and gums normal Neck: no adenopathy, no carotid bruit, no JVD, supple, symmetrical, trachea midline and thyroid not enlarged, symmetric, no tenderness/mass/nodules Back: symmetric, no curvature. ROM normal. No CVA tenderness. Resp: clear to auscultation bilaterally Chest wall: no tenderness Cardio: regular rate and rhythm, S1, S2 normal, no murmur, click, rub or gallop GI: soft, non-tender; bowel sounds normal; no masses,  no organomegaly Extremities: extremities normal, atraumatic, no cyanosis or  edema Pulses: 2+ and symmetric Skin: Skin color, texture, turgor normal. No rashes or lesions Lymph nodes: Cervical, supraclavicular, and axillary nodes normal. Neurologic: Grossly normal   Recent Labs  05/26/13 1051  WBC 6.1  HGB 14.1  HCT 43.1  PLT 252    Recent Labs  05/26/13 1051  NA 142  K 4.9  CO2 25  GLUCOSE 71  BUN 22.8  CREATININE 1.3  CALCIUM 9.9     Assessment and Plan:   67 year old with the following issues:  1. T2 N0 muscle invasive bladder cancer diagnosed in October of 2014 after he presented with hematuria. His staging workup did not reveal any evidence of metastatic disease and he is under consideration for radical cystectomy at [facility]. He is here to discuss the role of neoadjuvant chemotherapy. I  discussed with him the rationale of using neoadjuvant chemotherapy in muscle invasive bladder cancer including the marginal benefit of close to 6-10% improvement in disease control and overall survival. The logistics of administration of chemotherapy utilizing cisplatin and Gemzar were discussed today in detail. Complications from this regimen which will be administered for a curative intent were outlined today. These complications include infusion-related toxicity, myelosuppression, nephrotoxicity, neurotoxicity, electrolyte imbalance, nausea and vomiting, case altering and potentially weight loss. I've also discuss with him the need for possible growth factor support in the form of Neulasta and possible blood product transfusions which is less likely. He is agreeable and principle and we will schedule that in the near future after chemotherapy education class.  2. Renal insufficiency: Hepatic creatinine as high as 1.66 his repeat creatinine today is 1.3. I am going to obtain a renal ultrasound to rule out hydronephrosis before we proceed with cisplatin-based chemotherapy to make sure that is relieved beforehand. If there is no hydronephrosis then will proceed  as scheduled with chemotherapy in the future.  3. IV access: Risks and benefits of having a Port-A-Cath inserted were discussed today and he is agreeable to have that done. He understands there is no his risk of bleeding, infection and thrombosis associated with that. I've given him prescription for an MR cream today.   4. Nausea prophylaxis: I've given a prescription for Zofran today.

## 2013-05-26 NOTE — Progress Notes (Signed)
Checked in new patient with no financial issues. He has not been to [place] and has appt card. °

## 2013-05-27 ENCOUNTER — Other Ambulatory Visit: Payer: Self-pay | Admitting: Oncology

## 2013-05-27 ENCOUNTER — Encounter: Payer: Self-pay | Admitting: *Deleted

## 2013-05-27 ENCOUNTER — Ambulatory Visit (HOSPITAL_BASED_OUTPATIENT_CLINIC_OR_DEPARTMENT_OTHER): Payer: Medicare Other

## 2013-05-27 ENCOUNTER — Ambulatory Visit (HOSPITAL_COMMUNITY)
Admission: RE | Admit: 2013-05-27 | Discharge: 2013-05-27 | Disposition: A | Discharge status: Home / Self Care | Payer: Medicare Other | Source: Ambulatory Visit | Attending: Oncology | Admitting: Oncology

## 2013-05-27 DIAGNOSIS — C679 Malignant neoplasm of bladder, unspecified: Secondary | ICD-10-CM

## 2013-05-27 DIAGNOSIS — N1330 Unspecified hydronephrosis: Secondary | ICD-10-CM | POA: Insufficient documentation

## 2013-05-28 ENCOUNTER — Telehealth: Payer: Self-pay | Admitting: Oncology

## 2013-05-28 ENCOUNTER — Telehealth: Payer: Self-pay | Admitting: Medical Oncology

## 2013-05-28 ENCOUNTER — Encounter: Payer: Self-pay | Admitting: Oncology

## 2013-05-28 NOTE — Telephone Encounter (Signed)
Per MD, informed patient [place] and kidney function is good and the plan is to proceed with chemo therapy after his port is placed. Patient gave verbal understanding. Denies questions at this time and knows to call office with any questions or concerns.

## 2013-05-28 NOTE — Telephone Encounter (Signed)
S/w pt re next appt for 12/5. Pt will get new schedule when he comes in.

## 2013-06-01 ENCOUNTER — Encounter: Payer: Self-pay | Admitting: Oncology

## 2013-06-01 NOTE — Progress Notes (Signed)
Faxed fmla form to [facility] @ [phone] and Camille Bal @  [phone].

## 2013-06-02 ENCOUNTER — Telehealth: Payer: Self-pay | Admitting: Family Medicine

## 2013-06-02 ENCOUNTER — Other Ambulatory Visit (HOSPITAL_COMMUNITY): Payer: Self-pay | Admitting: Radiology

## 2013-06-02 MED ORDER — ESZOPICLONE 1 MG PO TABS: 1.0000 mg | ORAL_TABLET | Freq: Every evening | ORAL | Status: DC | PRN

## 2013-06-02 MED ORDER — CLONAZEPAM 0.5 MG PO TABS: 0.5000 mg | ORAL_TABLET | Freq: Every day | ORAL | Status: DC

## 2013-06-02 NOTE — Telephone Encounter (Signed)
Refills called in 

## 2013-06-02 NOTE — Telephone Encounter (Signed)
Okay to give 30 day supply, 2 refills

## 2013-06-02 NOTE — Telephone Encounter (Signed)
Refills of Clonazepam  And Eszopiclone  OK refill?

## 2013-06-04 ENCOUNTER — Encounter (HOSPITAL_COMMUNITY): Payer: Self-pay | Admitting: Pharmacy Technician

## 2013-06-07 ENCOUNTER — Ambulatory Visit (INDEPENDENT_AMBULATORY_CARE_PROVIDER_SITE_OTHER): Payer: Medicare Other | Admitting: Family Medicine

## 2013-06-07 ENCOUNTER — Telehealth: Payer: Self-pay | Admitting: *Deleted

## 2013-06-07 VITALS — BP 110/80 | HR 78 | Temp 98.2°F | Resp 18 | Ht 68.0 in | Wt 200.0 lb

## 2013-06-07 DIAGNOSIS — N390 Urinary tract infection, site not specified: Secondary | ICD-10-CM

## 2013-06-07 LAB — URINALYSIS, ROUTINE W REFLEX MICROSCOPIC
Bilirubin Urine: NEGATIVE
Glucose, UA: NEGATIVE mg/dL
Ketones, ur: NEGATIVE mg/dL
Nitrite: NEGATIVE
Specific Gravity, Urine: 1.02 (ref 1.005–1.030)
Urobilinogen, UA: 0.2 mg/dL (ref 0.0–1.0)
pH: 6 (ref 5.0–8.0)

## 2013-06-07 LAB — URINALYSIS, MICROSCOPIC ONLY: Crystals: NONE SEEN

## 2013-06-07 MED ORDER — CIPROFLOXACIN HCL 500 MG PO TABS: 500.0000 mg | ORAL_TABLET | Freq: Two times a day (BID) | ORAL | Status: DC

## 2013-06-07 NOTE — Assessment & Plan Note (Addendum)
Patient here with symptoms suggestive UTI. His prostate exam does not suggest prostatitis. I will go ahead anD place him on ciprofloxacin 500 mg twice a day as this will penetrate the prostate as well. Goal weight his urine culture. He did improve after the last round of antibiotics in November. Afford information to his urologist as well as his oncologist due to the recurrent infections

## 2013-06-07 NOTE — Patient Instructions (Signed)
Start cipro twice a day Culture to be sent

## 2013-06-07 NOTE — Progress Notes (Signed)
   Subjective:    Patient ID: MIRACLE CRIADO, male    DOB: 09-11-1945, 67 y.o.   MRN: [id number]  HPI  Patient here with UTI symptoms. He was treated on November 7 with Bactrim for one week. The urine culture was negative however his urinalysis was highly suggestive of urinary tract infection. He has history of bladder cancer we'll start chemotherapy this Friday. Over the weekend he began with dysuria as well as frequency and pressure again. He has also had some difficulty getting his urine stream now. He denies any blood in the urine. Denies any change in his bowels denies any abdominal pain or fever.  Review of Systems  GEN- denies fatigue, fever, weight loss,weakness, recent illness HEENT- denies eye drainage, change in vision, nasal discharge, CVS- denies chest pain, palpitations RESP- denies SOB, cough, wheeze ABD- denies N/V, change in stools, abd pain GU- + dysuria, denies  hematuria, dribbling, incontinence        Objective:   Physical Exam  GEN- NAD, alert and oriented x3 CVS- RRR, no murmur RESP-CTAB ABD-NABS,soft mild suprapubic tenderness, no rebound, no ND GU- Prostate smooth, non tender, not bogginess, no nodules, FOBT neg EXT- No edema Pulses- Radial 2+       Assessment & Plan:

## 2013-06-07 NOTE — Telephone Encounter (Signed)
INSTRUCTED PT. TO CALL HIS PRIMARY CARE PHYSICIAN. THIS NOTE TO DR.SHADAD'S ACTIVE WORK FOLDER.

## 2013-06-09 ENCOUNTER — Ambulatory Visit (HOSPITAL_COMMUNITY)
Admission: RE | Admit: 2013-06-09 | Discharge: 2013-06-09 | Disposition: A | Discharge status: Home / Self Care | Payer: Medicare Other | Source: Ambulatory Visit | Attending: Oncology | Admitting: Oncology

## 2013-06-09 ENCOUNTER — Encounter (HOSPITAL_COMMUNITY): Payer: Self-pay

## 2013-06-09 ENCOUNTER — Other Ambulatory Visit: Payer: Self-pay | Admitting: Oncology

## 2013-06-09 DIAGNOSIS — C679 Malignant neoplasm of bladder, unspecified: Secondary | ICD-10-CM | POA: Insufficient documentation

## 2013-06-09 LAB — CBC
HCT: 39 % (ref 39.0–52.0)
Hemoglobin: 13 g/dL (ref 13.0–17.0)
MCHC: 33.3 g/dL (ref 30.0–36.0)
Platelets: 246 10*3/uL (ref 150–400)
RBC: 4.49 MIL/uL (ref 4.22–5.81)
WBC: 6.7 10*3/uL (ref 4.0–10.5)

## 2013-06-09 LAB — BASIC METABOLIC PANEL
BUN: 22 mg/dL (ref 6–23)
Chloride: 105 mEq/L (ref 96–112)
Creatinine, Ser: 1.46 mg/dL — ABNORMAL HIGH (ref 0.50–1.35)
GFR calc Af Amer: 56 mL/min — ABNORMAL LOW (ref >90)
GFR calc non Af Amer: 48 mL/min — ABNORMAL LOW (ref >90)
Potassium: 4.1 mEq/L (ref 3.5–5.1)
Sodium: 137 mEq/L (ref 135–145)

## 2013-06-09 LAB — PROTIME-INR
INR: 0.95 (ref 0.00–1.49)
Prothrombin Time: 12.5 seconds (ref 11.6–15.2)

## 2013-06-09 MED ORDER — FENTANYL CITRATE 0.05 MG/ML IJ SOLN
INTRAMUSCULAR | Status: AC
  Filled 2013-06-09: qty 6

## 2013-06-09 MED ORDER — MIDAZOLAM HCL 2 MG/2ML IJ SOLN
INTRAMUSCULAR | Status: AC | PRN
  Administered 2013-06-09 (×4): 0.5 mg via INTRAVENOUS
  Administered 2013-06-09: 1 mg via INTRAVENOUS

## 2013-06-09 MED ORDER — LIDOCAINE-EPINEPHRINE (PF) 2 %-1:200000 IJ SOLN
INTRAMUSCULAR | Status: AC
  Filled 2013-06-09: qty 20

## 2013-06-09 MED ORDER — HEPARIN SOD (PORK) LOCK FLUSH 100 UNIT/ML IV SOLN
INTRAVENOUS | Status: AC | PRN
  Administered 2013-06-09: 500 [IU]

## 2013-06-09 MED ORDER — FENTANYL CITRATE 0.05 MG/ML IJ SOLN
INTRAMUSCULAR | Status: AC | PRN
  Administered 2013-06-09: 25 ug via INTRAVENOUS
  Administered 2013-06-09: 50 ug via INTRAVENOUS
  Administered 2013-06-09 (×3): 25 ug via INTRAVENOUS

## 2013-06-09 MED ORDER — MIDAZOLAM HCL 2 MG/2ML IJ SOLN
INTRAMUSCULAR | Status: AC
  Filled 2013-06-09: qty 6

## 2013-06-09 MED ORDER — HEPARIN SOD (PORK) LOCK FLUSH 100 UNIT/ML IV SOLN
INTRAVENOUS | Status: AC
  Filled 2013-06-09: qty 5

## 2013-06-09 MED ORDER — SODIUM CHLORIDE 0.9 % IV SOLN: Freq: Once | INTRAVENOUS | Status: DC

## 2013-06-09 MED ORDER — CEFAZOLIN SODIUM-DEXTROSE 2-3 GM-% IV SOLR
2.0000 g | Freq: Once | INTRAVENOUS | Status: AC
  Administered 2013-06-09: 2 g via INTRAVENOUS
  Filled 2013-06-09: qty 50

## 2013-06-09 NOTE — H&P (Signed)
Chief Complaint: "I'm here for a portacath" Referring Physician:Shadad HPI: [name] is an 67 y.o. male with bladder cancer. He is to start chemotherapy soon and is referred for port placement. He also had an abnormal UA and was started on abx a few days ago, but his culture is negative and he has no fever. PMHx and meds reviewed. Feels well otherwise.  Past Medical History:  Past Medical History  Diagnosis Date  . Bladder rupture 2010  . Allergy   . Depression   . Hyperlipidemia   . Restless leg syndrome, controlled   . Bladder tumor     Past Surgical History:  Past Surgical History  Procedure Laterality Date  . Appendectomy    . Nasal septum surgery    . Tonsillectomy and adenoidectomy    . Fracture surgery Right 01/05/2005    lower leg/ankle  . Bladder surgery  2010  . Transurethral resection of bladder tumor with gyrus (turbt-gyrus) N/A 04/21/2013    Procedure: TRANSURETHRAL RESECTION OF BLADDER TUMOR WITH GYRUS (TURBT-GYRUS), cystoscopy;  Surgeon: Milford Cage, MD;  Location: [facility];  Service: Urology;  Laterality: N/A;  . Cystoscopy w/ retrogrades Bilateral 04/21/2013    Procedure: CYSTOSCOPY WITH RETROGRADE PYELOGRAM;  Surgeon: Milford Cage, MD;  Location: [facility];  Service: Urology;  Laterality: Bilateral;    Family History:  Family History  Problem Relation Age of Onset  . Cancer Mother     ovarian  . Cirrhosis Father   . Alcohol abuse Father     Social History:  reports that he quit smoking about 23 years ago. His smoking use included Cigarettes. He has a 15 pack-year smoking history. He has never used smokeless tobacco. He reports that he drinks alcohol. He reports that he does not use illicit drugs.  Allergies:  Allergies  Allergen Reactions  . Augmentin [Amoxicillin-Pot Clavulanate] Nausea And Vomiting  . Zocor [Simvastatin] Other (See Comments)    myalgia    Medications:   Medication List    ASK your doctor about these  medications       alfuzosin 10 MG 24 hr tablet  Commonly known as:  UROXATRAL  Take 10 mg by mouth every morning.     aspirin 81 MG tablet  Take 81 mg by mouth daily.     ciprofloxacin 500 MG tablet  Commonly known as:  CIPRO  Take 1 tablet (500 mg total) by mouth 2 (two) times daily.     citalopram 20 MG tablet  Commonly known as:  CELEXA  Take 20 mg by mouth every morning.     clonazePAM 0.5 MG tablet  Commonly known as:  KLONOPIN  Take 0.5 mg by mouth at bedtime.     eszopiclone 1 MG Tabs tablet  Commonly known as:  LUNESTA  Take 1 mg by mouth at bedtime as needed for sleep (sleep). Take immediately before bedtime     ezetimibe 10 MG tablet  Commonly known as:  ZETIA  Take 10 mg by mouth every morning.     Fish Oil 1200 MG Caps  Take 1,200 mg by mouth 2 (two) times daily.     fluticasone 50 MCG/ACT nasal spray  Commonly known as:  FLONASE  Place 2 sprays into the nose daily.     lidocaine-prilocaine cream  Commonly known as:  EMLA  Apply 1 application topically as needed. Apply to port before chemotherapy.     omeprazole 20 MG capsule  Commonly known as:  PRILOSEC  Take 20 mg by mouth daily.     ondansetron 8 MG tablet  Commonly known as:  ZOFRAN  Take 1 tablet (8 mg total) by mouth every 8 (eight) hours as needed for nausea or vomiting.     rOPINIRole 3 MG tablet  Commonly known as:  REQUIP  Take 3 mg by mouth at bedtime.        Please HPI for pertinent positives, otherwise complete 10 system ROS negative.  Physical Exam: Temp: 97.8, HR: 69, BP: 130/81, RR: 20   General Appearance:  Alert, cooperative, no distress, appears stated age  Head:  Normocephalic, without obvious abnormality, atraumatic  ENT: Unremarkable  Neck: Supple, symmetrical, trachea midline  Lungs:   Clear to auscultation bilaterally, no w/r/r,   Chest Wall:  No tenderness or deformity  Heart:  Regular rate and rhythm, S1, S2 normal, no murmur, rub or gallop.  Abdomen:   Soft,  non-tender, non distended.  Neurologic: Normal affect, no gross deficits.   Results for orders placed during the hospital encounter of 06/09/13 (from the past 48 hour(s))  APTT     Status: None   Collection Time    06/09/13 12:05 PM      Result Value Range   aPTT 29  24 - 37 seconds  BASIC METABOLIC PANEL     Status: Abnormal   Collection Time    06/09/13 12:05 PM      Result Value Range   Sodium 137  135 - 145 mEq/L   Potassium 4.1  3.5 - 5.1 mEq/L   Chloride 105  96 - 112 mEq/L   CO2 23  19 - 32 mEq/L   Glucose, Bld 109 (*) 70 - 99 mg/dL   BUN 22  6 - 23 mg/dL   Creatinine, Ser 4.13 (*) 0.50 - 1.35 mg/dL   Calcium 9.0  8.4 - 24.4 mg/dL   GFR calc non Af Amer 48 (*) >90 mL/min   GFR calc Af Amer 56 (*) >90 mL/min   Comment: (NOTE)     The eGFR has been calculated using the CKD EPI equation.     This calculation has not been validated in all clinical situations.     eGFR's persistently <90 mL/min signify possible Chronic Kidney     Disease.  CBC     Status: None   Collection Time    06/09/13 12:05 PM      Result Value Range   WBC 6.7  4.0 - 10.5 K/uL   RBC 4.49  4.22 - 5.81 MIL/uL   Hemoglobin 13.0  13.0 - 17.0 g/dL   HCT 01.0  27.2 - 53.6 %   MCV 86.9  78.0 - 100.0 fL   MCH 29.0  26.0 - 34.0 pg   MCHC 33.3  30.0 - 36.0 g/dL   RDW 64.4  03.4 - 74.2 %   Platelets 246  150 - 400 K/uL  PROTIME-INR     Status: None   Collection Time    06/09/13 12:05 PM      Result Value Range   Prothrombin Time 12.5  11.6 - 15.2 seconds   INR 0.95  0.00 - 1.49   No results found.  Assessment/Plan Bladder cancer For Port placement Discussed procedure, risks, complications, use of sedation. Labs reviewed. Consent signed in chart  Brayton El PA-C 06/09/2013, 1:11 PM

## 2013-06-09 NOTE — Procedures (Signed)
Successful placement of right IJ approach port-a-cath with tip at the superior caval atrial junction. The catheter is ready for immediate use. No immediate post procedural complications. 

## 2013-06-10 ENCOUNTER — Encounter: Payer: Self-pay | Admitting: Oncology

## 2013-06-11 ENCOUNTER — Other Ambulatory Visit: Payer: Self-pay | Admitting: *Deleted

## 2013-06-11 ENCOUNTER — Ambulatory Visit (HOSPITAL_BASED_OUTPATIENT_CLINIC_OR_DEPARTMENT_OTHER): Payer: Medicare Other

## 2013-06-11 ENCOUNTER — Telehealth: Payer: Self-pay | Admitting: Oncology

## 2013-06-11 ENCOUNTER — Encounter: Payer: Self-pay | Admitting: Oncology

## 2013-06-11 ENCOUNTER — Other Ambulatory Visit: Payer: Medicare Other | Admitting: Lab

## 2013-06-11 ENCOUNTER — Ambulatory Visit (HOSPITAL_BASED_OUTPATIENT_CLINIC_OR_DEPARTMENT_OTHER): Payer: Medicare Other | Admitting: Oncology

## 2013-06-11 VITALS — BP 121/70 | HR 65 | Temp 97.0°F | Resp 20 | Ht 68.0 in | Wt 207.9 lb

## 2013-06-11 DIAGNOSIS — C679 Malignant neoplasm of bladder, unspecified: Secondary | ICD-10-CM

## 2013-06-11 DIAGNOSIS — N289 Disorder of kidney and ureter, unspecified: Secondary | ICD-10-CM

## 2013-06-11 DIAGNOSIS — C50919 Malignant neoplasm of unspecified site of unspecified female breast: Secondary | ICD-10-CM

## 2013-06-11 DIAGNOSIS — Z5111 Encounter for antineoplastic chemotherapy: Secondary | ICD-10-CM

## 2013-06-11 LAB — CBC WITH DIFFERENTIAL/PLATELET
Basophils Absolute: 0.1 10*3/uL (ref 0.0–0.1)
Eosinophils Absolute: 0.1 10*3/uL (ref 0.0–0.5)
HCT: 38.9 % (ref 38.4–49.9)
HGB: 13 g/dL (ref 13.0–17.1)
MONO#: 0.5 10*3/uL (ref 0.1–0.9)
NEUT#: 4.2 10*3/uL (ref 1.5–6.5)
NEUT%: 64.2 % (ref 39.0–75.0)
RBC: 4.39 10*6/uL (ref 4.20–5.82)
WBC: 6.5 10*3/uL (ref 4.0–10.3)
lymph#: 1.6 10*3/uL (ref 0.9–3.3)

## 2013-06-11 LAB — COMPREHENSIVE METABOLIC PANEL (CC13)
ALT: 14 U/L (ref 0–55)
Albumin: 3 g/dL — ABNORMAL LOW (ref 3.5–5.0)
Anion Gap: 11 mEq/L (ref 3–11)
BUN: 22.5 mg/dL (ref 7.0–26.0)
CO2: 24 mEq/L (ref 22–29)
Calcium: 9 mg/dL (ref 8.4–10.4)
Chloride: 106 mEq/L (ref 98–109)
Creatinine: 1.6 mg/dL — ABNORMAL HIGH (ref 0.7–1.3)

## 2013-06-11 MED ORDER — POTASSIUM CHLORIDE 2 MEQ/ML IV SOLN
Freq: Once | INTRAVENOUS | Status: AC
  Administered 2013-06-11: 11:00:00 via INTRAVENOUS
  Filled 2013-06-11: qty 10

## 2013-06-11 MED ORDER — SODIUM CHLORIDE 0.9 % IV SOLN
Freq: Once | INTRAVENOUS | Status: AC
  Administered 2013-06-11: 13:00:00 via INTRAVENOUS

## 2013-06-11 MED ORDER — PROCHLORPERAZINE MALEATE 10 MG PO TABS: 10.0000 mg | ORAL_TABLET | Freq: Four times a day (QID) | ORAL | Status: DC | PRN

## 2013-06-11 MED ORDER — HEPARIN SOD (PORK) LOCK FLUSH 100 UNIT/ML IV SOLN
500.0000 [IU] | Freq: Once | INTRAVENOUS | Status: AC | PRN
  Administered 2013-06-11: 500 [IU]
  Filled 2013-06-11: qty 5

## 2013-06-11 MED ORDER — DEXAMETHASONE SODIUM PHOSPHATE 20 MG/5ML IJ SOLN
INTRAMUSCULAR | Status: AC
  Filled 2013-06-11: qty 5

## 2013-06-11 MED ORDER — PALONOSETRON HCL INJECTION 0.25 MG/5ML
INTRAVENOUS | Status: AC
  Filled 2013-06-11: qty 5

## 2013-06-11 MED ORDER — SODIUM CHLORIDE 0.9 % IV SOLN
150.0000 mg | Freq: Once | INTRAVENOUS | Status: AC
  Administered 2013-06-11: 150 mg via INTRAVENOUS
  Filled 2013-06-11: qty 5

## 2013-06-11 MED ORDER — PALONOSETRON HCL INJECTION 0.25 MG/5ML
0.2500 mg | Freq: Once | INTRAVENOUS | Status: AC
  Administered 2013-06-11: 0.25 mg via INTRAVENOUS

## 2013-06-11 MED ORDER — SODIUM CHLORIDE 0.9 % IJ SOLN
10.0000 mL | INTRAMUSCULAR | Status: DC | PRN
  Administered 2013-06-11: 10 mL
  Filled 2013-06-11: qty 10

## 2013-06-11 MED ORDER — DEXAMETHASONE SODIUM PHOSPHATE 20 MG/5ML IJ SOLN
12.0000 mg | Freq: Once | INTRAMUSCULAR | Status: AC
  Administered 2013-06-11: 12 mg via INTRAVENOUS

## 2013-06-11 MED ORDER — SODIUM CHLORIDE 0.9 % IV SOLN
70.0000 mg/m2 | Freq: Once | INTRAVENOUS | Status: AC
  Administered 2013-06-11: 148 mg via INTRAVENOUS
  Filled 2013-06-11: qty 148

## 2013-06-11 MED ORDER — SODIUM CHLORIDE 0.9 % IV SOLN
1000.0000 mg/m2 | Freq: Once | INTRAVENOUS | Status: AC
  Administered 2013-06-11: 2128 mg via INTRAVENOUS
  Filled 2013-06-11: qty 56

## 2013-06-11 NOTE — Progress Notes (Signed)
Hematology and Oncology Follow Up Visit  ATOM SOLIVAN [id number] [birth year] 67 y.o. 06/11/2013 9:00 AM [name],[name], MDPickard, Priscille Heidelberg, MD   Principle Diagnosis: 67 year old gentleman with muscle invasive bladder cancer diagnosed in October of 2014 presented with a T2 N0 disease.   Prior Therapy: He is status post TURBT done on 04/21/2013 and the pathology showed invasive high-grade papillary urothelial carcinoma.  Current therapy: He is about to start neoadjuvant chemotherapy with cis-platinum and Gemzar cycle one day one scheduled for 06/11/2013.  Interim History: Mr. Gitto presents today for a followup visit, he is a gentleman with the above diagnosis and he is about to start with chemotherapy. His urinary and renal function have been normal with creatinine have normalized since his last visit. He has not reported any nausea or vomiting or abdominal pain. Has not reported any genitourinary complaints. His Port-A-Cath is in place without any complications.  Medications: I have reviewed the patient's current medications.  Current Outpatient Prescriptions  Medication Sig Dispense Refill  . alfuzosin (UROXATRAL) 10 MG 24 hr tablet Take 10 mg by mouth every morning.      [name] aspirin 81 MG tablet Take 81 mg by mouth daily.      . ciprofloxacin (CIPRO) 500 MG tablet Take 1 tablet (500 mg total) by mouth 2 (two) times daily.  20 tablet  0  . citalopram (CELEXA) 20 MG tablet Take 20 mg by mouth every morning.      . clonazePAM (KLONOPIN) 0.5 MG tablet Take 0.5 mg by mouth at bedtime.      . eszopiclone (LUNESTA) 1 MG TABS tablet Take 1 mg by mouth at bedtime as needed for sleep (sleep). Take immediately before bedtime      . ezetimibe (ZETIA) 10 MG tablet Take 10 mg by mouth every morning.      . fluticasone (FLONASE) 50 MCG/ACT nasal spray Place 2 sprays into the nose daily.      [name] lidocaine-prilocaine (EMLA) cream Apply 1 application topically as needed. Apply to port before chemotherapy.       . Omega-3 Fatty Acids (FISH OIL) 1200 MG CAPS Take 1,200 mg by mouth 2 (two) times daily.      [name] omeprazole (PRILOSEC) 20 MG capsule Take 20 mg by mouth daily.      . ondansetron (ZOFRAN) 8 MG tablet Take 1 tablet (8 mg total) by mouth every 8 (eight) hours as needed for nausea or vomiting.  20 tablet  0  . rOPINIRole (REQUIP) 3 MG tablet Take 3 mg by mouth at bedtime.       No current facility-administered medications for this visit.     Allergies:  Allergies  Allergen Reactions  . Augmentin [Amoxicillin-Pot Clavulanate] Nausea And Vomiting  . Zocor [Simvastatin] Other (See Comments)    myalgia    Past Medical History, Surgical history, Social history, and Family History were reviewed and updated.  Review of Systems:  Physical Exam: Blood pressure 121/70, pulse 65, temperature 97 F (36.1 C), temperature source Oral, resp. rate 20, height 5\' 8"  (1.727 m), weight 207 lb 14.4 oz (94.303 kg). ECOG: 0 General appearance: alert, cooperative and appears stated age Head: Normocephalic, without obvious abnormality, atraumatic Neck: no adenopathy, no carotid bruit, no JVD, supple, symmetrical, trachea midline and thyroid not enlarged, symmetric, no tenderness/mass/nodules Lymph nodes: Cervical, supraclavicular, and axillary nodes normal. Heart:regular rate and rhythm, S1, S2 normal, no murmur, click, rub or gallop Lung:chest clear, no wheezing, rales, normal symmetric air entry Abdomin: soft,  non-tender, without masses or organomegaly EXT:no erythema, induration, or nodules   Lab Results: Lab Results  Component Value Date   WBC 6.7 06/09/2013   HGB 13.0 06/09/2013   HCT 39.0 06/09/2013   MCV 86.9 06/09/2013   PLT 246 06/09/2013     Chemistry      Component Value Date/Time   NA 137 06/09/2013 1205   NA 142 05/26/2013 1051   K 4.1 06/09/2013 1205   K 4.9 05/26/2013 1051   CL 105 06/09/2013 1205   CO2 23 06/09/2013 1205   CO2 25 05/26/2013 1051   BUN 22 06/09/2013 1205   BUN 22.8  05/26/2013 1051   CREATININE 1.46* 06/09/2013 1205   CREATININE 1.3 05/26/2013 1051   CREATININE 1.66* 05/19/2013 1005      Component Value Date/Time   CALCIUM 9.0 06/09/2013 1205   CALCIUM 9.9 05/26/2013 1051   ALKPHOS 131 05/26/2013 1051   ALKPHOS 96 03/15/2013 1000   AST 19 05/26/2013 1051   AST 18 03/15/2013 1000   ALT 19 05/26/2013 1051   ALT 16 03/15/2013 1000   BILITOT 0.27 05/26/2013 1051   BILITOT 0.4 03/15/2013 1000       Radiological Studies: Ir Fluoro Guide Cv Line Right  06/09/2013   INDICATION: History of bladder cancer, in need of intravenous access for chemotherapy administration  EXAM: IMPLANTED PORT A CATH PLACEMENT WITH ULTRASOUND AND FLUOROSCOPIC GUIDANCE  MEDICATIONS: Ancef 2 gm IV; IV antibiotic was given in an appropriate time interval prior to skin puncture.  ANESTHESIA/SEDATION: Versed 3 mg IV; Fentanyl 150 mcg IV;  Total Moderate Sedation Time  30  minutes.  CONTRAST:  None  COMPARISON:  None.  FLUOROSCOPY TIME:  18 seconds.  PROCEDURE: The procedure, risks, benefits, and alternatives were explained to the patient. Questions regarding the procedure were encouraged and answered. The patient understands and consents to the procedure.  The right neck and chest were prepped with chlorhexidine in a sterile fashion, and a sterile drape was applied covering the operative field. Maximum barrier sterile technique with sterile gowns and gloves were used for the procedure. A timeout was performed prior to the initiation of the procedure. Local anesthesia was provided with 1% lidocaine with epinephrine.  After creating a small venotomy incision, a micropuncture kit was utilized to access the internal jugular vein under direct, real-time ultrasound guidance. Ultrasound image documentation was performed. The microwire was kinked to measure appropriate catheter length.  A subcutaneous port pocket was then created along the upper chest wall utilizing a combination of sharp and blunt dissection.  The pocket was irrigated with sterile saline. A single lumen ISP power injectable port was chosen for placement. The 8 Fr catheter was tunneled from the port pocket site to the venotomy incision. The port was placed in the pocket. The external catheter was trimmed to appropriate length. At the venotomy, an 8 Fr peel-away sheath was placed over a guidewire under fluoroscopic guidance. The catheter was then placed through the sheath and the sheath was removed. Final catheter positioning was confirmed and documented with a fluoroscopic spot radiograph. The port was accessed with a Huber needle, aspirated and flushed with heparinized saline.  The venotomy site was closed with an interrupted 4-0 Vicryl suture. The port pocket incision was closed with interrupted 2-0 Vicryl suture and the skin was opposed with a running subcuticular 4-0 Vicryl suture. Dermabond and Steri-strips were applied to both incisions. Dressings were placed. The patient tolerated the procedure well without immediate post procedural complication.  COMPLICATIONS: None immediate  FINDINGS: After catheter placement, the tip lies within the superior cavoatrial junction. The catheter aspirates and flushes normally and is ready for immediate use.  IMPRESSION: Successful placement of a right internal jugular approach power injectable Port-A-Cath. The catheter is ready for immediate use.   Electronically Signed   By: Simonne Come M.D.   On: 06/09/2013 15:51   Ir US Guide Vasc Access Right  06/09/2013   INDICATION: History of bladder cancer, in need of intravenous access for chemotherapy administration  EXAM: IMPLANTED PORT A CATH PLACEMENT WITH ULTRASOUND AND FLUOROSCOPIC GUIDANCE  MEDICATIONS: Ancef 2 gm IV; IV antibiotic was given in an appropriate time interval prior to skin puncture.  ANESTHESIA/SEDATION: Versed 3 mg IV; Fentanyl 150 mcg IV;  Total Moderate Sedation Time  30  minutes.  CONTRAST:  None  COMPARISON:  None.  FLUOROSCOPY TIME:  18  seconds.  PROCEDURE: The procedure, risks, benefits, and alternatives were explained to the patient. Questions regarding the procedure were encouraged and answered. The patient understands and consents to the procedure.  The right neck and chest were prepped with chlorhexidine in a sterile fashion, and a sterile drape was applied covering the operative field. Maximum barrier sterile technique with sterile gowns and gloves were used for the procedure. A timeout was performed prior to the initiation of the procedure. Local anesthesia was provided with 1% lidocaine with epinephrine.  After creating a small venotomy incision, a micropuncture kit was utilized to access the internal jugular vein under direct, real-time ultrasound guidance. Ultrasound image documentation was performed. The microwire was kinked to measure appropriate catheter length.  A subcutaneous port pocket was then created along the upper chest wall utilizing a combination of sharp and blunt dissection. The pocket was irrigated with sterile saline. A single lumen ISP power injectable port was chosen for placement. The 8 Fr catheter was tunneled from the port pocket site to the venotomy incision. The port was placed in the pocket. The external catheter was trimmed to appropriate length. At the venotomy, an 8 Fr peel-away sheath was placed over a guidewire under fluoroscopic guidance. The catheter was then placed through the sheath and the sheath was removed. Final catheter positioning was confirmed and documented with a fluoroscopic spot radiograph. The port was accessed with a Huber needle, aspirated and flushed with heparinized saline.  The venotomy site was closed with an interrupted 4-0 Vicryl suture. The port pocket incision was closed with interrupted 2-0 Vicryl suture and the skin was opposed with a running subcuticular 4-0 Vicryl suture. Dermabond and Steri-strips were applied to both incisions. Dressings were placed. The patient tolerated the  procedure well without immediate post procedural complication.  COMPLICATIONS: None immediate  FINDINGS: After catheter placement, the tip lies within the superior cavoatrial junction. The catheter aspirates and flushes normally and is ready for immediate use.  IMPRESSION: Successful placement of a right internal jugular approach power injectable Port-A-Cath. The catheter is ready for immediate use.   Electronically Signed   By: Simonne Come M.D.   On: 06/09/2013 15:51     Impression and Plan:  67 year old with the following issues:  1. T2 N0 muscle invasive bladder cancer diagnosed in October of 2014 after he presented with hematuria. His staging workup did not reveal any evidence of metastatic disease and he is under consideration for radical cystectomy at [facility]. He is to start neoadjuvant chemotherapy with cisplatin and Gemzar today is day 1 cycle 1. He is ready  to proceed all his questions were answered.  2. Renal insufficiency: His last creatinine have normalized and he is ready to proceed with cisplatin.  3. IV access he has a Port-A-Cath inserted without any complications.  4. Followup: Will be on 12/12 for day 8 of cycle 1 with Gemzar.    Osamah Schmader, MD 12/5/20149:00 AM

## 2013-06-11 NOTE — Progress Notes (Signed)
Refaxed fmla forms to [facility] @ [phone] and Camille Bal [phone].

## 2013-06-11 NOTE — Telephone Encounter (Signed)
appts made per 12/5 POF AVS and CAL taken to pt in Tx room shh

## 2013-06-11 NOTE — Patient Instructions (Addendum)
[facility] Discharge Instructions for Patients Receiving Chemotherapy  Today you received the following chemotherapy agents: Cisplatin, Gemzar  To help prevent nausea and vomiting after your treatment, we encourage you to take your nausea medication as prescribed.   If you develop nausea and vomiting that is not controlled by your nausea medication, call the clinic.   BELOW ARE SYMPTOMS THAT SHOULD BE REPORTED IMMEDIATELY:  *FEVER GREATER THAN 100.5 F  *CHILLS WITH OR WITHOUT FEVER  NAUSEA AND VOMITING THAT IS NOT CONTROLLED WITH YOUR NAUSEA MEDICATION  *UNUSUAL SHORTNESS OF BREATH  *UNUSUAL BRUISING OR BLEEDING  TENDERNESS IN MOUTH AND THROAT WITH OR WITHOUT PRESENCE OF ULCERS  *URINARY PROBLEMS  *BOWEL PROBLEMS  UNUSUAL RASH Items with * indicate a potential emergency and should be followed up as soon as possible.  Feel free to call the clinic you have any questions or concerns. The clinic phone number is [phone].  Cisplatin injection What is this medicine? CISPLATIN (SIS pla tin) is a chemotherapy drug. It targets fast dividing cells, like cancer cells, and causes these cells to die. This medicine is used to treat many types of cancer like bladder, ovarian, and testicular cancers. This medicine may be used for other purposes; ask your health care provider or pharmacist if you have questions. COMMON BRAND NAME(S): Platinol -AQ, Platinol What should I tell my health care provider before I take this medicine? They need to know if you have any of these conditions: -blood disorders -hearing problems -kidney disease -recent or ongoing radiation therapy -an unusual or allergic reaction to cisplatin, carboplatin, other chemotherapy, other medicines, foods, dyes, or preservatives -pregnant or trying to get pregnant -breast-feeding How should I use this medicine? This drug is given as an infusion into a vein. It is administered in a hospital or clinic by  a specially trained health care professional. Talk to your pediatrician regarding the use of this medicine in children. Special care may be needed. Overdosage: If you think you have taken too much of this medicine contact a poison control center or emergency room at once. NOTE: This medicine is only for you. Do not share this medicine with others. What if I miss a dose? It is important not to miss a dose. Call your doctor or health care professional if you are unable to keep an appointment. What may interact with this medicine? -dofetilide -foscarnet -medicines for seizures -medicines to increase blood counts like filgrastim, pegfilgrastim, sargramostim -probenecid -pyridoxine used with altretamine -rituximab -some antibiotics like amikacin, gentamicin, neomycin, polymyxin B, streptomycin, tobramycin -sulfinpyrazone -vaccines -zalcitabine Talk to your doctor or health care professional before taking any of these medicines: -acetaminophen -aspirin -ibuprofen -ketoprofen -naproxen This list may not describe all possible interactions. Give your health care provider a list of all the medicines, herbs, non-prescription drugs, or dietary supplements you use. Also tell them if you smoke, drink alcohol, or use illegal drugs. Some items may interact with your medicine. What should I watch for while using this medicine? Your condition will be monitored carefully while you are receiving this medicine. You will need important blood work done while you are taking this medicine. This drug may make you feel generally unwell. This is not uncommon, as chemotherapy can affect healthy cells as well as cancer cells. Report any side effects. Continue your course of treatment even though you feel ill unless your doctor tells you to stop. In some cases, you may be given additional medicines to help with side effects. Follow all  directions for their use. Call your doctor or health care professional for advice  if you get a fever, chills or sore throat, or other symptoms of a cold or flu. Do not treat yourself. This drug decreases your body's ability to fight infections. Try to avoid being around people who are sick. This medicine may increase your risk to bruise or bleed. Call your doctor or health care professional if you notice any unusual bleeding. Be careful brushing and flossing your teeth or using a toothpick because you may get an infection or bleed more easily. If you have any dental work done, tell your dentist you are receiving this medicine. Avoid taking products that contain aspirin, acetaminophen, ibuprofen, naproxen, or ketoprofen unless instructed by your doctor. These medicines may hide a fever. Do not become pregnant while taking this medicine. Women should inform their doctor if they wish to become pregnant or think they might be pregnant. There is a potential for serious side effects to an unborn child. Talk to your health care professional or pharmacist for more information. Do not breast-feed an infant while taking this medicine. Drink fluids as directed while you are taking this medicine. This will help protect your kidneys. Call your doctor or health care professional if you get diarrhea. Do not treat yourself. What side effects may I notice from receiving this medicine? Side effects that you should report to your doctor or health care professional as soon as possible: -allergic reactions like skin rash, itching or hives, swelling of the face, lips, or tongue -signs of infection - fever or chills, cough, sore throat, pain or difficulty passing urine -signs of decreased platelets or bleeding - bruising, pinpoint red spots on the skin, black, tarry stools, nosebleeds -signs of decreased red blood cells - unusually weak or tired, fainting spells, lightheadedness -breathing problems -changes in hearing -gout pain -low blood counts - This drug may decrease the number of white blood cells,  red blood cells and platelets. You may be at increased risk for infections and bleeding. -nausea and vomiting -pain, swelling, redness or irritation at the injection site -pain, tingling, numbness in the hands or feet -problems with balance, movement -trouble passing urine or change in the amount of urine Side effects that usually do not require medical attention (report to your doctor or health care professional if they continue or are bothersome): -changes in vision -loss of appetite -metallic taste in the mouth or changes in taste This list may not describe all possible side effects. Call your doctor for medical advice about side effects. You may report side effects to FDA at 1-800-FDA-1088. Where should I keep my medicine? This drug is given in a hospital or clinic and will not be stored at home. NOTE: This sheet is a summary. It may not cover all possible information. If you have questions about this medicine, talk to your doctor, pharmacist, or health care provider.  2014, Elsevier/Gold Standard. (2007-09-29 14:40:54)  Gemcitabine injection - Gemzar What is this medicine? GEMCITABINE (jem SIT a been) is a chemotherapy drug. This medicine is used to treat many types of cancer like breast cancer, lung cancer, pancreatic cancer, and ovarian cancer. This medicine may be used for other purposes; ask your health care provider or pharmacist if you have questions. COMMON BRAND NAME(S): Gemzar What should I tell my health care provider before I take this medicine? They need to know if you have any of these conditions: -blood disorders -infection -kidney disease -liver disease -recent or ongoing radiation  therapy -an unusual or allergic reaction to gemcitabine, other chemotherapy, other medicines, foods, dyes, or preservatives -pregnant or trying to get pregnant -breast-feeding How should I use this medicine? This drug is given as an infusion into a vein. It is administered in a hospital  or clinic by a specially trained health care professional. Talk to your pediatrician regarding the use of this medicine in children. Special care may be needed. Overdosage: If you think you have taken too much of this medicine contact a poison control center or emergency room at once. NOTE: This medicine is only for you. Do not share this medicine with others. What if I miss a dose? It is important not to miss your dose. Call your doctor or health care professional if you are unable to keep an appointment. What may interact with this medicine? -medicines to increase blood counts like filgrastim, pegfilgrastim, sargramostim -some other chemotherapy drugs like cisplatin -vaccines Talk to your doctor or health care professional before taking any of these medicines: -acetaminophen -aspirin -ibuprofen -ketoprofen -naproxen This list may not describe all possible interactions. Give your health care provider a list of all the medicines, herbs, non-prescription drugs, or dietary supplements you use. Also tell them if you smoke, drink alcohol, or use illegal drugs. Some items may interact with your medicine. What should I watch for while using this medicine? Visit your doctor for checks on your progress. This drug may make you feel generally unwell. This is not uncommon, as chemotherapy can affect healthy cells as well as cancer cells. Report any side effects. Continue your course of treatment even though you feel ill unless your doctor tells you to stop. In some cases, you may be given additional medicines to help with side effects. Follow all directions for their use. Call your doctor or health care professional for advice if you get a fever, chills or sore throat, or other symptoms of a cold or flu. Do not treat yourself. This drug decreases your body's ability to fight infections. Try to avoid being around people who are sick. This medicine may increase your risk to bruise or bleed. Call your doctor  or health care professional if you notice any unusual bleeding. Be careful brushing and flossing your teeth or using a toothpick because you may get an infection or bleed more easily. If you have any dental work done, tell your dentist you are receiving this medicine. Avoid taking products that contain aspirin, acetaminophen, ibuprofen, naproxen, or ketoprofen unless instructed by your doctor. These medicines may hide a fever. Women should inform their doctor if they wish to become pregnant or think they might be pregnant. There is a potential for serious side effects to an unborn child. Talk to your health care professional or pharmacist for more information. Do not breast-feed an infant while taking this medicine. What side effects may I notice from receiving this medicine? Side effects that you should report to your doctor or health care professional as soon as possible: -allergic reactions like skin rash, itching or hives, swelling of the face, lips, or tongue -low blood counts - this medicine may decrease the number of white blood cells, red blood cells and platelets. You may be at increased risk for infections and bleeding. -signs of infection - fever or chills, cough, sore throat, pain or difficulty passing urine -signs of decreased platelets or bleeding - bruising, pinpoint red spots on the skin, black, tarry stools, blood in the urine -signs of decreased red blood cells - unusually weak  or tired, fainting spells, lightheadedness -breathing problems -chest pain -mouth sores -nausea and vomiting -pain, swelling, redness at site where injected -pain, tingling, numbness in the hands or feet -stomach pain -swelling of ankles, feet, hands -unusual bleeding Side effects that usually do not require medical attention (report to your doctor or health care professional if they continue or are bothersome): -constipation -diarrhea -hair loss -loss of appetite -stomach upset This list may not  describe all possible side effects. Call your doctor for medical advice about side effects. You may report side effects to FDA at 1-800-FDA-1088. Where should I keep my medicine? This drug is given in a hospital or clinic and will not be stored at home. NOTE: This sheet is a summary. It may not cover all possible information. If you have questions about this medicine, talk to your doctor, pharmacist, or health care provider.  2014, Elsevier/Gold Standard. (2007-11-03 18:45:54)

## 2013-06-14 ENCOUNTER — Telehealth: Payer: Self-pay | Admitting: *Deleted

## 2013-06-14 NOTE — Telephone Encounter (Signed)
Reports he felt "yucky" Sunday, but no specific complaint. Has not required any antiemetic thus far. Some decrease in appetite, but eating small frequent amounts. Some mild constipation-will start MiraLax today. Mouth is dry, but no sores. No pain and IV site is unremarkable. Knows to call for any questions or problems.

## 2013-06-17 ENCOUNTER — Other Ambulatory Visit: Payer: Self-pay | Admitting: *Deleted

## 2013-06-18 ENCOUNTER — Other Ambulatory Visit: Payer: Medicare Other | Admitting: Lab

## 2013-06-18 ENCOUNTER — Ambulatory Visit (HOSPITAL_BASED_OUTPATIENT_CLINIC_OR_DEPARTMENT_OTHER): Payer: Medicare Other | Admitting: Physician Assistant

## 2013-06-18 ENCOUNTER — Encounter: Payer: Self-pay | Admitting: Physician Assistant

## 2013-06-18 ENCOUNTER — Ambulatory Visit (HOSPITAL_BASED_OUTPATIENT_CLINIC_OR_DEPARTMENT_OTHER): Payer: Medicare Other

## 2013-06-18 ENCOUNTER — Other Ambulatory Visit (HOSPITAL_BASED_OUTPATIENT_CLINIC_OR_DEPARTMENT_OTHER): Payer: Medicare Other

## 2013-06-18 VITALS — BP 126/79 | HR 59 | Resp 18 | Ht 68.0 in | Wt 200.3 lb

## 2013-06-18 DIAGNOSIS — N289 Disorder of kidney and ureter, unspecified: Secondary | ICD-10-CM

## 2013-06-18 DIAGNOSIS — C671 Malignant neoplasm of dome of bladder: Secondary | ICD-10-CM

## 2013-06-18 DIAGNOSIS — C50919 Malignant neoplasm of unspecified site of unspecified female breast: Secondary | ICD-10-CM

## 2013-06-18 DIAGNOSIS — Z5111 Encounter for antineoplastic chemotherapy: Secondary | ICD-10-CM

## 2013-06-18 DIAGNOSIS — C679 Malignant neoplasm of bladder, unspecified: Secondary | ICD-10-CM

## 2013-06-18 DIAGNOSIS — R911 Solitary pulmonary nodule: Secondary | ICD-10-CM

## 2013-06-18 LAB — COMPREHENSIVE METABOLIC PANEL (CC13)
Albumin: 3.4 g/dL — ABNORMAL LOW (ref 3.5–5.0)
Alkaline Phosphatase: 124 U/L (ref 40–150)
Anion Gap: 12 mEq/L — ABNORMAL HIGH (ref 3–11)
BUN: 29.7 mg/dL — ABNORMAL HIGH (ref 7.0–26.0)
CO2: 23 mEq/L (ref 22–29)
Creatinine: 1.5 mg/dL — ABNORMAL HIGH (ref 0.7–1.3)
Glucose: 133 mg/dl (ref 70–140)
Potassium: 4.9 mEq/L (ref 3.5–5.1)
Sodium: 139 mEq/L (ref 136–145)
Total Protein: 7.7 g/dL (ref 6.4–8.3)

## 2013-06-18 LAB — CBC WITH DIFFERENTIAL/PLATELET
Basophils Absolute: 0 10*3/uL (ref 0.0–0.1)
Eosinophils Absolute: 0 10*3/uL (ref 0.0–0.5)
HGB: 13.4 g/dL (ref 13.0–17.1)
LYMPH%: 36.9 % (ref 14.0–49.0)
MCV: 88.1 fL (ref 79.3–98.0)
MONO#: 0.1 10*3/uL (ref 0.1–0.9)
MONO%: 2.5 % (ref 0.0–14.0)
NEUT#: 1.9 10*3/uL (ref 1.5–6.5)
NEUT%: 58.8 % (ref 39.0–75.0)
Platelets: 169 10*3/uL (ref 140–400)
RDW: 13.4 % (ref 11.0–14.6)
WBC: 3.2 10*3/uL — ABNORMAL LOW (ref 4.0–10.3)

## 2013-06-18 MED ORDER — SODIUM CHLORIDE 0.9 % IV SOLN
Freq: Once | INTRAVENOUS | Status: AC
  Administered 2013-06-18: 14:00:00 via INTRAVENOUS

## 2013-06-18 MED ORDER — PROCHLORPERAZINE MALEATE 10 MG PO TABS
10.0000 mg | ORAL_TABLET | Freq: Once | ORAL | Status: AC
  Administered 2013-06-18: 10 mg via ORAL

## 2013-06-18 MED ORDER — PROCHLORPERAZINE MALEATE 10 MG PO TABS
ORAL_TABLET | ORAL | Status: AC
  Filled 2013-06-18: qty 1

## 2013-06-18 MED ORDER — SODIUM CHLORIDE 0.9 % IJ SOLN
10.0000 mL | INTRAMUSCULAR | Status: DC | PRN
  Administered 2013-06-18: 10 mL
  Filled 2013-06-18: qty 10

## 2013-06-18 MED ORDER — HEPARIN SOD (PORK) LOCK FLUSH 100 UNIT/ML IV SOLN
500.0000 [IU] | Freq: Once | INTRAVENOUS | Status: AC | PRN
  Administered 2013-06-18: 500 [IU]
  Filled 2013-06-18: qty 5

## 2013-06-18 MED ORDER — SODIUM CHLORIDE 0.9 % IV SOLN
1000.0000 mg/m2 | Freq: Once | INTRAVENOUS | Status: AC
  Administered 2013-06-18: 2128 mg via INTRAVENOUS
  Filled 2013-06-18: qty 55.97

## 2013-06-18 NOTE — Progress Notes (Signed)
Hematology and Oncology Follow Up Visit  [name] [id number] [birth year] 67 y.o. 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   Prior Therapy: He is status post TURBT done on 04/21/2013 and the pathology showed invasive high-grade papillary urothelial carcinoma.  Current therapy:  Neoadjuvant chemotherapy with cis-platinum and Gemzar. Status post day 1 of cycle 1.   Interim History: Mr. [name] presents today for a symptom management visit. he is a gentleman with the above diagnosis and he is about to start with chemotherapy. His urinary and renal function are relatively stable with a creatinine of about 1.5 mg/dL.He initially tolerated day 1 of his first cycle of neoadjuvant chemotherapy relatively well but it was short-lived. He had significant fatigue decreased energy approximately 2 days after chemotherapy. He also had some issues with decreased appetite and constipation. He use MiraLAX to address the constipation and reports that his appetite has improved slightly. His energy level today is about back to his baseline level. He has not reported any nausea or vomiting or abdominal pain. Has not reported any genitourinary complaints. His Port-A-Cath is in place without any complications.  Medications: I have reviewed the patient's current medications.  Current Outpatient Prescriptions  Medication Sig Dispense Refill  . aspirin 81 MG tablet Take 81 mg by mouth daily.      . ciprofloxacin (CIPRO) 500 MG tablet Take 1 tablet (500 mg total) by mouth 2 (two) times daily.  20 tablet  0  . citalopram (CELEXA) 20 MG tablet Take 20 mg by mouth every morning.      . clonazePAM (KLONOPIN) 0.5 MG tablet Take 0.5 mg by mouth at bedtime.      . eszopiclone (LUNESTA) 1 MG TABS tablet Take 1 mg by mouth at bedtime as needed for sleep (sleep). Take  immediately before bedtime      . ezetimibe (ZETIA) 10 MG tablet Take 10 mg by mouth every morning.      . fluticasone (FLONASE) 50 MCG/ACT nasal spray Place 2 sprays into the nose daily.      [name] lidocaine-prilocaine (EMLA) cream Apply 1 application topically as needed. Apply to port before chemotherapy.      . Omega-3 Fatty Acids (FISH OIL) 1200 MG CAPS Take 1,200 mg by mouth 2 (two) times daily.      [name] omeprazole (PRILOSEC) 20 MG capsule Take 20 mg by mouth daily.      . prochlorperazine (COMPAZINE) 10 MG tablet Take 1 tablet (10 mg total) by mouth every 6 (six) hours as needed for nausea or vomiting.  30 tablet  2  . rOPINIRole (REQUIP) 3 MG tablet Take 3 mg by mouth at bedtime.      [name] alfuzosin (UROXATRAL) 10 MG 24 hr tablet Take 10 mg by mouth every morning.      . ondansetron (ZOFRAN) 8 MG tablet Take 1 tablet (8 mg total) by mouth every 8 (eight) hours as needed for nausea or vomiting.  20 tablet  0   No current facility-administered medications for this visit.   Facility-Administered Medications Ordered in Other Visits  Medication Dose Route Frequency Provider Last Rate Last Dose  . sodium chloride 0.9 % injection 10 mL  10 mL Intracatheter PRN Benjiman Core, MD   10 mL at 06/18/13 1507     Allergies:  Allergies  Allergen Reactions  . Augmentin [Amoxicillin-Pot  Clavulanate] Nausea And Vomiting  . Zocor [Simvastatin] Other (See Comments)    myalgia    Past Medical History, Surgical history, Social history, and Family History were reviewed and updated.  Review of Systems:  Physical Exam: Blood pressure 126/79, pulse 59, temperature 0 F (-17.8 C), resp. rate 18, height 5\' 8"  (1.727 m), weight 200 lb 4.8 oz (90.855 kg), SpO2 97.00%.  ECOG: 0  General appearance: alert, cooperative and appears stated age Head: Normocephalic, without obvious abnormality, atraumatic Neck: no adenopathy, no carotid bruit, no JVD, supple, symmetrical, trachea midline and thyroid not enlarged,  symmetric, no tenderness/mass/nodules Lymph nodes: Cervical, supraclavicular, and axillary nodes normal. Heart:regular rate and rhythm, S1, S2 normal, no murmur, click, rub or gallop Lung:chest clear, no wheezing, rales, normal symmetric air entry Abdomin: soft, non-tender, without masses or organomegaly EXT:no erythema, induration, or nodules, no rashes   Lab Results: Lab Results  Component Value Date   WBC 3.2* 06/18/2013   HGB 13.4 06/18/2013   HCT 40.8 06/18/2013   MCV 88.1 06/18/2013   PLT 169 06/18/2013     Chemistry      Component Value Date/Time   NA 139 06/18/2013 1152   NA 137 06/09/2013 1205   K 4.9 06/18/2013 1152   K 4.1 06/09/2013 1205   CL 105 06/09/2013 1205   CO2 23 06/18/2013 1152   CO2 23 06/09/2013 1205   BUN 29.7* 06/18/2013 1152   BUN 22 06/09/2013 1205   CREATININE 1.5* 06/18/2013 1152   CREATININE 1.46* 06/09/2013 1205   CREATININE 1.66* 05/19/2013 1005      Component Value Date/Time   CALCIUM 9.3 06/18/2013 1152   CALCIUM 9.0 06/09/2013 1205   ALKPHOS 124 06/18/2013 1152   ALKPHOS 96 03/15/2013 1000   AST 26 06/18/2013 1152   AST 18 03/15/2013 1000   ALT 36 06/18/2013 1152   ALT 16 03/15/2013 1000   BILITOT 0.26 06/18/2013 1152   BILITOT 0.4 03/15/2013 1000       Radiological Studies: No results found.   Impression and Plan:  67 year old with the following issues:  1. T2 N0 muscle invasive bladder cancer diagnosed in October of 2014 after he presented with hematuria. His staging workup did not reveal any evidence of metastatic disease and he is under consideration for radical cystectomy at [facility]. He started neoadjuvant chemotherapy with cisplatin and Gemzar and is status post day 1 cycle 1.   2. Renal insufficiency: His creatinine today is 1.5. We will continue to monitor this closely and have encouraged the patient to increase his fluid intake.  3. IV access he has a Port-A-Cath inserted without any complications.  4. Followup: Will be  on 12/23 for day 1 of cycle 2 with cisplatin and Gemzar.  Patient discussed with him and also seen by Dr. Clelia Croft.   Tiana Loft E, PA-C  12/12/20145:01 PM

## 2013-06-18 NOTE — Patient Instructions (Signed)
[facility] Center Discharge Instructions for Patients Receiving Chemotherapy  Today you received the following chemotherapy agents :  Gemcitabine.  To help prevent nausea and vomiting after your treatment, we encourage you to take your nausea medication as instructed by your physician.   If you develop nausea and vomiting that is not controlled by your nausea medication, call the clinic.   BELOW ARE SYMPTOMS THAT SHOULD BE REPORTED IMMEDIATELY:  *FEVER GREATER THAN 100.5 F  *CHILLS WITH OR WITHOUT FEVER  NAUSEA AND VOMITING THAT IS NOT CONTROLLED WITH YOUR NAUSEA MEDICATION  *UNUSUAL SHORTNESS OF BREATH  *UNUSUAL BRUISING OR BLEEDING  TENDERNESS IN MOUTH AND THROAT WITH OR WITHOUT PRESENCE OF ULCERS  *URINARY PROBLEMS  *BOWEL PROBLEMS  UNUSUAL RASH Items with * indicate a potential emergency and should be followed up as soon as possible.  Feel free to call the clinic you have any questions or concerns. The clinic phone number is [phone].    

## 2013-06-20 NOTE — Patient Instructions (Signed)
Continue with labs and chemotherapy as scheduled Follow up with Dr. Clelia Croft in 2 weeks

## 2013-06-21 ENCOUNTER — Telehealth: Payer: Self-pay | Admitting: Medical Oncology

## 2013-06-21 NOTE — Telephone Encounter (Signed)
Patient called stating woke up with "stabbing pain to right knee" woke him at 2 am, pain behind knee, swollen, tender, denies redness, warm to touch. States he has gout in "left big toe and it feels like that". Pain mainly during movement and when bearing weight on. Reviewed with MD, patient to f/u with PCP. Patient gave verbal understanding. Knows to call office with any other questions or concerns.

## 2013-06-23 ENCOUNTER — Encounter: Payer: Self-pay | Admitting: Physician Assistant

## 2013-06-23 ENCOUNTER — Ambulatory Visit (INDEPENDENT_AMBULATORY_CARE_PROVIDER_SITE_OTHER): Payer: Medicare Other | Admitting: Physician Assistant

## 2013-06-23 VITALS — BP 96/60 | HR 68 | Temp 97.1°F | Resp 18 | Wt 203.0 lb

## 2013-06-23 DIAGNOSIS — M109 Gout, unspecified: Secondary | ICD-10-CM

## 2013-06-23 DIAGNOSIS — C679 Malignant neoplasm of bladder, unspecified: Secondary | ICD-10-CM | POA: Insufficient documentation

## 2013-06-23 MED ORDER — PREDNISONE 20 MG PO TABS: ORAL_TABLET | ORAL | Status: DC

## 2013-06-23 NOTE — Progress Notes (Signed)
Patient ID: [name] MRN: [id number], DOB: [birth year], 67 y.o. Date of Encounter: 06/23/2013, 10:31 AM    Chief Complaint:  Chief Complaint  Patient presents with  . gout flare up left big toe    also painful right knee     HPI: 67 y.o. year old white male reports that he has a long history of some gout. Says that he thinks he was evaluated for gout in about 1994 when he was in [place].  Says that since then anytime he starts to feel a flair, he just uses Naprosyn or Aleve and it resolves. However with this episode he has been taking these medications but the pain and erythema are not resolving. Says that his left first toe started bothering him Friday which was 5 days ago. About 4 days ago his right knee was achy, consistent with gout. Says that the knee is a lot better. However the left first toe now it is staying about the same.  He has had no injury or trauma to the toe or the knee.  He reports that he does have bladder cancer and is receiving chemotherapy. He says that he called the oncologist office and let them know that he did have a gout flare and they told him to come here for evaluation and treatment -and stated that  any treatment we would prescribe would be okay to take with his chemotherapy. He says that he goes and receivess IV chemotherapy on Fridays. He goes in for 2 Fridays , then off one Friday,--  2 weeks on,  one-week off for a total of 6 treatments. After  6 treatments they  plan to remove the bladder.     Home Meds: See attached medication section for any medications that were entered at today's visit. The computer does not put those onto this list.The following list is a list of meds entered prior to today's visit.   Current Outpatient Prescriptions on File Prior to Visit  Medication Sig Dispense Refill  . aspirin 81 MG tablet Take 81 mg by mouth daily.      . citalopram (CELEXA) 20 MG tablet Take 20 mg by mouth every morning.      . clonazePAM  (KLONOPIN) 0.5 MG tablet Take 0.5 mg by mouth at bedtime.      . eszopiclone (LUNESTA) 1 MG TABS tablet Take 1 mg by mouth at bedtime as needed for sleep (sleep). Take immediately before bedtime      . ezetimibe (ZETIA) 10 MG tablet Take 10 mg by mouth every morning.      . fluticasone (FLONASE) 50 MCG/ACT nasal spray Place 2 sprays into the nose daily.      [name] lidocaine-prilocaine (EMLA) cream Apply 1 application topically as needed. Apply to port before chemotherapy.      . Omega-3 Fatty Acids (FISH OIL) 1200 MG CAPS Take 1,200 mg by mouth 2 (two) times daily.      [name] omeprazole (PRILOSEC) 20 MG capsule Take 20 mg by mouth daily.      . ondansetron (ZOFRAN) 8 MG tablet Take 1 tablet (8 mg total) by mouth every 8 (eight) hours as needed for nausea or vomiting.  20 tablet  0  . prochlorperazine (COMPAZINE) 10 MG tablet Take 1 tablet (10 mg total) by mouth every 6 (six) hours as needed for nausea or vomiting.  30 tablet  2  . rOPINIRole (REQUIP) 3 MG tablet Take 3 mg by mouth at bedtime.      [name]  alfuzosin (UROXATRAL) 10 MG 24 hr tablet Take 10 mg by mouth every morning.       No current facility-administered medications on file prior to visit.    Allergies:  Allergies  Allergen Reactions  . Augmentin [Amoxicillin-Pot Clavulanate] Nausea And Vomiting  . Zocor [Simvastatin] Other (See Comments)    myalgia      Review of Systems: See HPI for pertinent ROS. All other ROS negative.    Physical Exam: Blood pressure 96/60, pulse 68, temperature 97.1 F (36.2 C), temperature source Oral, resp. rate 18, weight 203 lb (92.08 kg)., Body mass index is 30.87 kg/(m^2). General: WNWD WM. Appears in no acute distress. Lungs: Clear bilaterally to auscultation without wheezes, rales, or rhonchi. Breathing is unlabored. Heart: Regular rhythm. No murmurs, rubs, or gallops. Msk:  Strength and tone normal for age. Left first toe: The joint there is purplish colored erythema, c/w resolving ecchymosis. Minimal  swelling, minimal warmth. Right knee: Inspection is normal. There is no swelling, no warmth, no erythema. He says that there is only minimal achiness deep inside the joint. He says that this is actually much better than it was several days ago. Now has increased range of motion with decreased pain. Extremities/Skin: Warm and dry.  Neuro: Alert and oriented X 3. Moves all extremities spontaneously. Gait is normal. CNII-XII grossly in tact. Psych:  Responds to questions appropriately with a normal affect.     ASSESSMENT AND PLAN:  67 y.o. year old male with  1. Gout - predniSONE (DELTASONE) 20 MG tablet; Take 3 daily for 2 days, then 2 daily for 2 days, then 1 daily for 2 days.  Dispense: 12 tablet; Refill: 0  2. Bladder cancer   We'll treat current flare with prednisone taper. He has informed oncology of this. I discussed with him the  chemotherapy and cancer have his immune system down already. The prednisone may further decrease his immune system. He is to  continue to be extra cautious about protecting himself from getting illnesses.  Also discussed with him that there are medications available to take on a daily basis to help his body excrete uric acid to prevent gout flares. Also discussed there are prescription medications available to use for gout flares that would work better than the over-the-counter medicines he is currently using. He will come back in for an office visit to address this and to check a uric acid level once he is off of his chemotherapy.   Signed, [address] [place], [place], [facility] 06/23/2013 10:31 AM

## 2013-06-28 ENCOUNTER — Encounter: Payer: Self-pay | Admitting: Oncology

## 2013-06-29 ENCOUNTER — Ambulatory Visit (HOSPITAL_BASED_OUTPATIENT_CLINIC_OR_DEPARTMENT_OTHER): Payer: Medicare Other | Admitting: Oncology

## 2013-06-29 ENCOUNTER — Telehealth: Payer: Self-pay | Admitting: Oncology

## 2013-06-29 ENCOUNTER — Ambulatory Visit: Payer: Medicare Other | Admitting: Oncology

## 2013-06-29 ENCOUNTER — Other Ambulatory Visit: Payer: Self-pay | Admitting: *Deleted

## 2013-06-29 ENCOUNTER — Telehealth: Payer: Self-pay | Admitting: *Deleted

## 2013-06-29 ENCOUNTER — Other Ambulatory Visit: Payer: Self-pay | Admitting: Oncology

## 2013-06-29 ENCOUNTER — Ambulatory Visit (HOSPITAL_BASED_OUTPATIENT_CLINIC_OR_DEPARTMENT_OTHER): Payer: Medicare Other

## 2013-06-29 VITALS — BP 131/68 | HR 71 | Temp 97.3°F | Resp 18 | Ht 68.0 in | Wt 203.3 lb

## 2013-06-29 DIAGNOSIS — C679 Malignant neoplasm of bladder, unspecified: Secondary | ICD-10-CM

## 2013-06-29 DIAGNOSIS — N289 Disorder of kidney and ureter, unspecified: Secondary | ICD-10-CM

## 2013-06-29 LAB — COMPREHENSIVE METABOLIC PANEL (CC13)
ALT: 20 U/L (ref 0–55)
AST: 15 U/L (ref 5–34)
Albumin: 3.3 g/dL — ABNORMAL LOW (ref 3.5–5.0)
Alkaline Phosphatase: 119 U/L (ref 40–150)
Anion Gap: 11 mEq/L (ref 3–11)
CO2: 22 mEq/L (ref 22–29)
Creatinine: 1.4 mg/dL — ABNORMAL HIGH (ref 0.7–1.3)
Potassium: 4 mEq/L (ref 3.5–5.1)
Total Bilirubin: 0.2 mg/dL (ref 0.20–1.20)
Total Protein: 7.2 g/dL (ref 6.4–8.3)

## 2013-06-29 LAB — CBC WITH DIFFERENTIAL/PLATELET
BASO%: 0.3 % (ref 0.0–2.0)
EOS%: 0.7 % (ref 0.0–7.0)
Eosinophils Absolute: 0 10*3/uL (ref 0.0–0.5)
HCT: 37.8 % — ABNORMAL LOW (ref 38.4–49.9)
LYMPH%: 44.5 % (ref 14.0–49.0)
MCH: 29.5 pg (ref 27.2–33.4)
MCHC: 33.9 g/dL (ref 32.0–36.0)
MCV: 87.1 fL (ref 79.3–98.0)
MONO%: 8.2 % (ref 0.0–14.0)
NEUT%: 46.3 % (ref 39.0–75.0)
Platelets: 122 10*3/uL — ABNORMAL LOW (ref 140–400)

## 2013-06-29 NOTE — Progress Notes (Signed)
Letter dictated

## 2013-06-29 NOTE — Progress Notes (Signed)
Hematology and Oncology Follow Up Visit  [name] [id number] 10-04-1945 67 y.o. 06/29/2013 1:15 PM [name],[name], MDPickard, Priscille Heidelberg, MD   Principle Diagnosis: 67 year old gentleman with muscle invasive bladder cancer diagnosed in October of 2014 presented with a T2 N0 disease.  Prior Therapy: He is status post TURBT done on 04/21/2013 and the pathology showed invasive high-grade papillary urothelial carcinoma.  Current therapy: He is about to start neoadjuvant chemotherapy with cis-platinum and Gemzar cycle one day one scheduled for 06/11/2013. He is here for an evaluation before cycle 2.  Interim History: [name] for a followup visit, he is a gentleman with the above diagnosis and he is status post of a previous cycle of chemotherapy. He tolerated very well without any complications. He has not reported any infusion-related complications or peripheral neuropathy. His urinary and renal function have been normal with creatinine have normalized since his last visit. He has not reported any nausea or vomiting or abdominal pain. Has not reported any genitourinary complaints. His Port-A-Cath is in place without any complications. He is ready to proceed with the next cycle of chemotherapy.  Medications: I have reviewed the patient's current medications.  Current Outpatient Prescriptions  Medication Sig Dispense Refill  . aspirin 81 MG tablet Take 81 mg by mouth daily.      . citalopram (CELEXA) 20 MG tablet Take 20 mg by mouth every morning.      . clonazePAM (KLONOPIN) 0.5 MG tablet Take 0.5 mg by mouth at bedtime.      . eszopiclone (LUNESTA) 1 MG TABS tablet Take 1 mg by mouth at bedtime as needed for sleep (sleep). Take immediately before bedtime      . ezetimibe (ZETIA) 10 MG tablet Take 10 mg by mouth every morning.      . fluticasone (FLONASE) 50 MCG/ACT nasal spray Place 2 sprays into the nose daily.      . Omega-3 Fatty Acids (FISH OIL) 1200 MG CAPS Take 1,200 mg  by mouth 2 (two) times daily.      [name] omeprazole (PRILOSEC) 20 MG capsule Take 20 mg by mouth daily.      [name] rOPINIRole (REQUIP) 3 MG tablet Take 3 mg by mouth at bedtime.      . lidocaine-prilocaine (EMLA) cream Apply 1 application topically as needed. Apply to port before chemotherapy.      . ondansetron (ZOFRAN) 8 MG tablet Take 1 tablet (8 mg total) by mouth every 8 (eight) hours as needed for nausea or vomiting.  20 tablet  0  . prochlorperazine (COMPAZINE) 10 MG tablet Take 1 tablet (10 mg total) by mouth every 6 (six) hours as needed for nausea or vomiting.  30 tablet  2   No current facility-administered medications for this visit.     Allergies:  Allergies  Allergen Reactions  . Augmentin [Amoxicillin-Pot Clavulanate] Nausea And Vomiting  . Zocor [Simvastatin] Other (See Comments)    myalgia    Past Medical History, Surgical history, Social history, and Family History were reviewed and updated.  Review of Systems:  Physical Exam: Blood pressure 131/68, pulse 71, temperature 97.3 F (36.3 C), temperature source Oral, resp. rate 18, height 5\' 8"  (1.727 m), weight 203 lb 4.8 oz (92.216 kg), SpO2 98.00%. ECOG: 0 General appearance: alert, cooperative and appears stated age Head: Normocephalic, without obvious abnormality, atraumatic Neck: no adenopathy, no carotid bruit, no JVD, supple, symmetrical, trachea midline and thyroid not enlarged, symmetric, no tenderness/mass/nodules Lymph nodes: Cervical, supraclavicular, and  axillary nodes normal. Heart:regular rate and rhythm, S1, S2 normal, no murmur, click, rub or gallop Lung:chest clear, no wheezing, rales, normal symmetric air entry Abdomin: soft, non-tender, without masses or organomegaly EXT:no erythema, induration, or nodules   Lab Results: Lab Results  Component Value Date   WBC 3.3* 06/29/2013   HGB 12.8* 06/29/2013   HCT 37.8* 06/29/2013   MCV 87.1 06/29/2013   PLT 122* 06/29/2013     Chemistry      Component  Value Date/Time   NA 139 06/18/2013 1152   NA 137 06/09/2013 1205   K 4.9 06/18/2013 1152   K 4.1 06/09/2013 1205   CL 105 06/09/2013 1205   CO2 23 06/18/2013 1152   CO2 23 06/09/2013 1205   BUN 29.7* 06/18/2013 1152   BUN 22 06/09/2013 1205   CREATININE 1.5* 06/18/2013 1152   CREATININE 1.46* 06/09/2013 1205   CREATININE 1.66* 05/19/2013 1005      Component Value Date/Time   CALCIUM 9.3 06/18/2013 1152   CALCIUM 9.0 06/09/2013 1205   ALKPHOS 124 06/18/2013 1152   ALKPHOS 96 03/15/2013 1000   AST 26 06/18/2013 1152   AST 18 03/15/2013 1000   ALT 36 06/18/2013 1152   ALT 16 03/15/2013 1000   BILITOT 0.26 06/18/2013 1152   BILITOT 0.4 03/15/2013 1000        Impression and Plan:  67 year old with the following issues:  1. T2 N0 muscle invasive bladder cancer diagnosed in October of 2014 after he presented with hematuria. His staging workup did not reveal any evidence of metastatic disease and he is under consideration for radical cystectomy at [facility].  He is status post the first cycle of neoadjuvant chemotherapy and ready to proceed with the second cycle. He'll receive day 1 of cycle 2 on 07/02/2013. His laboratory data were reviewed [name] and he should be ready to proceed without any delay.  2. Renal insufficiency: His last creatinine have normalized and he is ready to proceed with cisplatin.  3. IV access he has a Port-A-Cath inserted without any complications.  4. Followup: Will be on 07/09/2013.     Eli Hose, MD 12/23/20141:15 PM

## 2013-06-29 NOTE — Telephone Encounter (Signed)
Per staff message and POF I have scheduled appts.  JMW  

## 2013-06-29 NOTE — Telephone Encounter (Signed)
Gave pt appt for lab,md and chemo for december and january 2015

## 2013-07-02 ENCOUNTER — Other Ambulatory Visit: Payer: Self-pay | Admitting: Internal Medicine

## 2013-07-02 ENCOUNTER — Ambulatory Visit (HOSPITAL_BASED_OUTPATIENT_CLINIC_OR_DEPARTMENT_OTHER): Payer: Medicare Other

## 2013-07-02 VITALS — BP 114/71 | HR 63 | Temp 98.0°F | Resp 18

## 2013-07-02 DIAGNOSIS — C679 Malignant neoplasm of bladder, unspecified: Secondary | ICD-10-CM

## 2013-07-02 DIAGNOSIS — Z5111 Encounter for antineoplastic chemotherapy: Secondary | ICD-10-CM

## 2013-07-02 DIAGNOSIS — R911 Solitary pulmonary nodule: Secondary | ICD-10-CM

## 2013-07-02 MED ORDER — SODIUM CHLORIDE 0.9 % IV SOLN
Freq: Once | INTRAVENOUS | Status: AC
  Administered 2013-07-02: 12:00:00 via INTRAVENOUS

## 2013-07-02 MED ORDER — SODIUM CHLORIDE 0.9 % IV SOLN
1000.0000 mg/m2 | Freq: Once | INTRAVENOUS | Status: AC
  Administered 2013-07-02: 2128 mg via INTRAVENOUS
  Filled 2013-07-02: qty 56

## 2013-07-02 MED ORDER — DEXAMETHASONE SODIUM PHOSPHATE 20 MG/5ML IJ SOLN
INTRAMUSCULAR | Status: AC
  Filled 2013-07-02: qty 5

## 2013-07-02 MED ORDER — SODIUM CHLORIDE 0.9 % IJ SOLN
10.0000 mL | INTRAMUSCULAR | Status: DC | PRN
  Administered 2013-07-02: 10 mL
  Filled 2013-07-02: qty 10

## 2013-07-02 MED ORDER — POTASSIUM CHLORIDE 2 MEQ/ML IV SOLN
Freq: Once | INTRAVENOUS | Status: AC
  Administered 2013-07-02: 10:00:00 via INTRAVENOUS
  Filled 2013-07-02: qty 10

## 2013-07-02 MED ORDER — PALONOSETRON HCL INJECTION 0.25 MG/5ML
0.2500 mg | Freq: Once | INTRAVENOUS | Status: AC
  Administered 2013-07-02: 0.25 mg via INTRAVENOUS

## 2013-07-02 MED ORDER — HEPARIN SOD (PORK) LOCK FLUSH 100 UNIT/ML IV SOLN
500.0000 [IU] | Freq: Once | INTRAVENOUS | Status: AC | PRN
  Administered 2013-07-02: 500 [IU]
  Filled 2013-07-02: qty 5

## 2013-07-02 MED ORDER — DEXAMETHASONE SODIUM PHOSPHATE 20 MG/5ML IJ SOLN
12.0000 mg | Freq: Once | INTRAMUSCULAR | Status: AC
  Administered 2013-07-02: 12 mg via INTRAVENOUS

## 2013-07-02 MED ORDER — SODIUM CHLORIDE 0.9 % IV SOLN
71.0000 mg/m2 | Freq: Once | INTRAVENOUS | Status: AC
  Administered 2013-07-02: 150 mg via INTRAVENOUS
  Filled 2013-07-02: qty 150

## 2013-07-02 MED ORDER — PALONOSETRON HCL INJECTION 0.25 MG/5ML
INTRAVENOUS | Status: AC
  Filled 2013-07-02: qty 5

## 2013-07-02 MED ORDER — SODIUM CHLORIDE 0.9 % IV SOLN
150.0000 mg | Freq: Once | INTRAVENOUS | Status: AC
  Administered 2013-07-02: 150 mg via INTRAVENOUS
  Filled 2013-07-02: qty 5

## 2013-07-02 NOTE — Patient Instructions (Signed)
[facility] Discharge Instructions for Patients Receiving Chemotherapy  Today you received the following chemotherapy agents: Cisplatin, Gemzar  To help prevent nausea and vomiting after your treatment, we encourage you to take your nausea medication as prescribed.    If you develop nausea and vomiting that is not controlled by your nausea medication, call the clinic.   BELOW ARE SYMPTOMS THAT SHOULD BE REPORTED IMMEDIATELY:  *FEVER GREATER THAN 100.5 F  *CHILLS WITH OR WITHOUT FEVER  NAUSEA AND VOMITING THAT IS NOT CONTROLLED WITH YOUR NAUSEA MEDICATION  *UNUSUAL SHORTNESS OF BREATH  *UNUSUAL BRUISING OR BLEEDING  TENDERNESS IN MOUTH AND THROAT WITH OR WITHOUT PRESENCE OF ULCERS  *URINARY PROBLEMS  *BOWEL PROBLEMS  UNUSUAL RASH Items with * indicate a potential emergency and should be followed up as soon as possible.  Feel free to call the clinic you have any questions or concerns. The clinic phone number is [phone].

## 2013-07-09 ENCOUNTER — Ambulatory Visit (HOSPITAL_BASED_OUTPATIENT_CLINIC_OR_DEPARTMENT_OTHER): Payer: Medicare Other

## 2013-07-09 ENCOUNTER — Other Ambulatory Visit (HOSPITAL_BASED_OUTPATIENT_CLINIC_OR_DEPARTMENT_OTHER): Payer: Medicare Other

## 2013-07-09 ENCOUNTER — Ambulatory Visit (HOSPITAL_BASED_OUTPATIENT_CLINIC_OR_DEPARTMENT_OTHER): Payer: Medicare Other | Admitting: Oncology

## 2013-07-09 VITALS — BP 124/70 | HR 75 | Temp 97.5°F | Resp 20 | Ht 68.0 in | Wt 200.9 lb

## 2013-07-09 DIAGNOSIS — C50919 Malignant neoplasm of unspecified site of unspecified female breast: Secondary | ICD-10-CM | POA: Diagnosis not present

## 2013-07-09 DIAGNOSIS — C779 Secondary and unspecified malignant neoplasm of lymph node, unspecified: Secondary | ICD-10-CM

## 2013-07-09 DIAGNOSIS — C671 Malignant neoplasm of dome of bladder: Secondary | ICD-10-CM

## 2013-07-09 DIAGNOSIS — Z5111 Encounter for antineoplastic chemotherapy: Secondary | ICD-10-CM | POA: Diagnosis not present

## 2013-07-09 DIAGNOSIS — N289 Disorder of kidney and ureter, unspecified: Secondary | ICD-10-CM | POA: Diagnosis not present

## 2013-07-09 DIAGNOSIS — C679 Malignant neoplasm of bladder, unspecified: Secondary | ICD-10-CM

## 2013-07-09 DIAGNOSIS — R911 Solitary pulmonary nodule: Secondary | ICD-10-CM

## 2013-07-09 LAB — CBC WITH DIFFERENTIAL/PLATELET
BASO%: 1.1 % (ref 0.0–2.0)
BASOS ABS: 0 10*3/uL (ref 0.0–0.1)
EOS%: 0.1 % (ref 0.0–7.0)
Eosinophils Absolute: 0 10*3/uL (ref 0.0–0.5)
HEMATOCRIT: 39.9 % (ref 38.4–49.9)
HGB: 13.6 g/dL (ref 13.0–17.1)
LYMPH%: 37.5 % (ref 14.0–49.0)
MCH: 29.7 pg (ref 27.2–33.4)
MCHC: 33.9 g/dL (ref 32.0–36.0)
MCV: 87.5 fL (ref 79.3–98.0)
MONO#: 0.2 10*3/uL (ref 0.1–0.9)
MONO%: 6.4 % (ref 0.0–14.0)
NEUT#: 2 10*3/uL (ref 1.5–6.5)
NEUT%: 54.9 % (ref 39.0–75.0)
Platelets: 274 10*3/uL (ref 140–400)
RBC: 4.56 10*6/uL (ref 4.20–5.82)
RDW: 13.8 % (ref 11.0–14.6)
WBC: 3.7 10*3/uL — ABNORMAL LOW (ref 4.0–10.3)
lymph#: 1.4 10*3/uL (ref 0.9–3.3)

## 2013-07-09 LAB — COMPREHENSIVE METABOLIC PANEL (CC13)
ALK PHOS: 122 U/L (ref 40–150)
ALT: 37 U/L (ref 0–55)
AST: 28 U/L (ref 5–34)
Albumin: 3.7 g/dL (ref 3.5–5.0)
Anion Gap: 13 mEq/L — ABNORMAL HIGH (ref 3–11)
BILIRUBIN TOTAL: 0.22 mg/dL (ref 0.20–1.20)
BUN: 23.4 mg/dL (ref 7.0–26.0)
CALCIUM: 9.6 mg/dL (ref 8.4–10.4)
CHLORIDE: 104 meq/L (ref 98–109)
CO2: 22 mEq/L (ref 22–29)
CREATININE: 1.4 mg/dL — AB (ref 0.7–1.3)
Glucose: 131 mg/dl (ref 70–140)
Potassium: 4.5 mEq/L (ref 3.5–5.1)
Sodium: 139 mEq/L (ref 136–145)
Total Protein: 8 g/dL (ref 6.4–8.3)

## 2013-07-09 MED ORDER — PROCHLORPERAZINE MALEATE 10 MG PO TABS
10.0000 mg | ORAL_TABLET | Freq: Once | ORAL | Status: AC
  Administered 2013-07-09: 10 mg via ORAL

## 2013-07-09 MED ORDER — SODIUM CHLORIDE 0.9 % IV SOLN
Freq: Once | INTRAVENOUS | Status: AC
  Administered 2013-07-09: 13:00:00 via INTRAVENOUS

## 2013-07-09 MED ORDER — PROCHLORPERAZINE MALEATE 10 MG PO TABS
ORAL_TABLET | ORAL | Status: AC
  Filled 2013-07-09: qty 1

## 2013-07-09 MED ORDER — SODIUM CHLORIDE 0.9 % IV SOLN
1000.0000 mg/m2 | Freq: Once | INTRAVENOUS | Status: AC
  Administered 2013-07-09: 2128 mg via INTRAVENOUS
  Filled 2013-07-09: qty 56

## 2013-07-09 MED ORDER — HEPARIN SOD (PORK) LOCK FLUSH 100 UNIT/ML IV SOLN
500.0000 [IU] | Freq: Once | INTRAVENOUS | Status: AC | PRN
  Administered 2013-07-09: 500 [IU]
  Filled 2013-07-09: qty 5

## 2013-07-09 MED ORDER — LIDOCAINE-PRILOCAINE 2.5-2.5 % EX CREA
TOPICAL_CREAM | CUTANEOUS | Status: AC
  Filled 2013-07-09: qty 5

## 2013-07-09 MED ORDER — SODIUM CHLORIDE 0.9 % IJ SOLN
10.0000 mL | INTRAMUSCULAR | Status: DC | PRN
  Administered 2013-07-09: 10 mL
  Filled 2013-07-09: qty 10

## 2013-07-09 NOTE — Patient Instructions (Signed)
[facility] Discharge Instructions for Patients Receiving Chemotherapy  Today you received the following chemotherapy agents Gemzar  To help prevent nausea and vomiting after your treatment, we encourage you to take your nausea medication as needed   If you develop nausea and vomiting that is not controlled by your nausea medication, call the clinic.   BELOW ARE SYMPTOMS THAT SHOULD BE REPORTED IMMEDIATELY:  *FEVER GREATER THAN 100.5 F  *CHILLS WITH OR WITHOUT FEVER  NAUSEA AND VOMITING THAT IS NOT CONTROLLED WITH YOUR NAUSEA MEDICATION  *UNUSUAL SHORTNESS OF BREATH  *UNUSUAL BRUISING OR BLEEDING  TENDERNESS IN MOUTH AND THROAT WITH OR WITHOUT PRESENCE OF ULCERS  *URINARY PROBLEMS  *BOWEL PROBLEMS  UNUSUAL RASH Items with * indicate a potential emergency and should be followed up as soon as possible.  Feel free to call the clinic you have any questions or concerns. The clinic phone number is [phone].    

## 2013-07-09 NOTE — Progress Notes (Signed)
Hematology and Oncology Follow Up Visit  [name] [id number] 1945-07-21 68 y.o. 07/09/2013 11:40 AM [name], MDPickard, [name], MD   Principle Diagnosis: 68 year old gentleman with muscle invasive bladder cancer diagnosed in October of 2014 presented with a T2 N0 disease.  Prior Therapy: He is status post TURBT done on 04/21/2013 and the pathology showed invasive high-grade papillary urothelial carcinoma.  Current therapy: He is on neoadjuvant chemotherapy with cisplatinum and Gemzar started on 06/11/2013. He is here for an evaluation before cycle 2 day 8.   Interim History: [name] presents today for a followup visit, he is a gentleman with the above diagnosis and he is status post of a previous cycle of chemotherapy. He tolerated very well without any complications. He has not reported any infusion-related complications or peripheral neuropathy. His urinary and renal function have been normal with creatinine have normalized since his last visit. He has not reported any nausea or vomiting or abdominal pain. Has not reported any genitourinary complaints. His Port-A-Cath is in place without any complications. He is ready to proceed with the next cycle of chemotherapy. He reported some mild fatigue that was manageable for the most part.  Medications: I have reviewed the patient's current medications.  Current Outpatient Prescriptions  Medication Sig Dispense Refill  . aspirin 81 MG tablet Take 81 mg by mouth daily.      . citalopram (CELEXA) 20 MG tablet Take 20 mg by mouth every morning.      . clonazePAM (KLONOPIN) 0.5 MG tablet Take 0.5 mg by mouth at bedtime.      . eszopiclone (LUNESTA) 1 MG TABS tablet Take 1 mg by mouth at bedtime as needed for sleep (sleep). Take immediately before bedtime      . ezetimibe (ZETIA) 10 MG tablet Take 10 mg by mouth every morning.      . fluticasone (FLONASE) 50 MCG/ACT nasal spray Place 2 sprays into the nose daily.      [name]  lidocaine-prilocaine (EMLA) cream Apply 1 application topically as needed. Apply to port before chemotherapy.      . Omega-3 Fatty Acids (FISH OIL) 1200 MG CAPS Take 1,200 mg by mouth 2 (two) times daily.      [name] omeprazole (PRILOSEC) 20 MG capsule Take 20 mg by mouth daily.      . ondansetron (ZOFRAN) 8 MG tablet Take 1 tablet (8 mg total) by mouth every 8 (eight) hours as needed for nausea or vomiting.  20 tablet  0  . prochlorperazine (COMPAZINE) 10 MG tablet Take 1 tablet (10 mg total) by mouth every 6 (six) hours as needed for nausea or vomiting.  30 tablet  2  . rOPINIRole (REQUIP) 3 MG tablet Take 3 mg by mouth at bedtime.       No current facility-administered medications for this visit.     Allergies:  Allergies  Allergen Reactions  . Augmentin [Amoxicillin-Pot Clavulanate] Nausea And Vomiting  . Zocor [Simvastatin] Other (See Comments)    myalgia    Past Medical History, Surgical history, Social history, and Family History were reviewed and updated.  Review of Systems:  Physical Exam: Blood pressure 124/70, pulse 75, temperature 97.5 F (36.4 C), temperature source Oral, resp. rate 20, height 5\' 8"  (1.727 m), weight 200 lb 14.4 oz (91.128 kg). ECOG: 0 General appearance: alert, cooperative and appears stated age Head: Normocephalic, without obvious abnormality, atraumatic Neck: no adenopathy, no carotid bruit, no JVD, supple, symmetrical, trachea midline and thyroid not enlarged, symmetric,  no tenderness/mass/nodules Lymph nodes: Cervical, supraclavicular, and axillary nodes normal. Heart:regular rate and rhythm, S1, S2 normal, no murmur, click, rub or gallop Lung:chest clear, no wheezing, rales, normal symmetric air entry Abdomin: soft, non-tender, without masses or organomegaly EXT:no erythema, induration, or nodules   Lab Results: Lab Results  Component Value Date   WBC 3.7* 07/09/2013   HGB 13.6 07/09/2013   HCT 39.9 07/09/2013   MCV 87.5 07/09/2013   PLT 274 07/09/2013      Chemistry      Component Value Date/Time   NA 139 06/29/2013 1216   NA 137 06/09/2013 1205   K 4.0 06/29/2013 1216   K 4.1 06/09/2013 1205   CL 105 06/09/2013 1205   CO2 22 06/29/2013 1216   CO2 23 06/09/2013 1205   BUN 29.7* 06/29/2013 1216   BUN 22 06/09/2013 1205   CREATININE 1.4* 06/29/2013 1216   CREATININE 1.46* 06/09/2013 1205   CREATININE 1.66* 05/19/2013 1005      Component Value Date/Time   CALCIUM 8.8 06/29/2013 1216   CALCIUM 9.0 06/09/2013 1205   ALKPHOS 119 06/29/2013 1216   ALKPHOS 96 03/15/2013 1000   AST 15 06/29/2013 1216   AST 18 03/15/2013 1000   ALT 20 06/29/2013 1216   ALT 16 03/15/2013 1000   BILITOT 0.20 06/29/2013 1216   BILITOT 0.4 03/15/2013 1000        Impression and Plan:  68 year old with the following issues:  1. T2 N0 muscle invasive bladder cancer diagnosed in October of 2014 after he presented with hematuria. His staging workup did not reveal any evidence of metastatic disease and he is under consideration for radical cystectomy at [facility].  He is status post the first cycle of neoadjuvant chemotherapy and ready to proceed with the second cycle. He'll receive day 8 of cycle 2 with any delay. His laboratory data were reviewed today as well.   2. Renal insufficiency: His last creatinine have normalized and he is ready to proceed with cisplatin.  3. IV access he has a Port-A-Cath inserted without any complications.  4. Followup: Will be on 07/23/2013 for cycle three day one.     [facility], MD 1/2/201511:40 AM

## 2013-07-12 ENCOUNTER — Telehealth: Payer: Self-pay | Admitting: *Deleted

## 2013-07-12 NOTE — Telephone Encounter (Signed)
Spoke with patient, states he had a good BM this morning and feels much better, encouraged increased hydration, fruits and veggies. try warm prunes in the morning. Keep regularly scheduled appt jan 16th. Lab/ dr shadad/chemo.

## 2013-07-16 ENCOUNTER — Encounter: Payer: Self-pay | Admitting: Oncology

## 2013-07-20 ENCOUNTER — Telehealth: Payer: Self-pay | Admitting: *Deleted

## 2013-07-20 NOTE — Telephone Encounter (Signed)
Spoke with patient, Dictated letter here for patient p/u on 07/23/13 for regularly scheduled appt with dr Alen Blew.

## 2013-07-23 ENCOUNTER — Other Ambulatory Visit (HOSPITAL_BASED_OUTPATIENT_CLINIC_OR_DEPARTMENT_OTHER): Payer: Medicare Other

## 2013-07-23 ENCOUNTER — Ambulatory Visit (HOSPITAL_BASED_OUTPATIENT_CLINIC_OR_DEPARTMENT_OTHER): Payer: Medicare Other

## 2013-07-23 ENCOUNTER — Ambulatory Visit (HOSPITAL_BASED_OUTPATIENT_CLINIC_OR_DEPARTMENT_OTHER): Payer: Medicare Other | Admitting: Oncology

## 2013-07-23 ENCOUNTER — Telehealth: Payer: Self-pay | Admitting: Oncology

## 2013-07-23 ENCOUNTER — Encounter: Payer: Self-pay | Admitting: Oncology

## 2013-07-23 VITALS — BP 127/73 | HR 71 | Temp 97.7°F | Resp 18 | Ht 68.0 in | Wt 203.4 lb

## 2013-07-23 DIAGNOSIS — C671 Malignant neoplasm of dome of bladder: Secondary | ICD-10-CM

## 2013-07-23 DIAGNOSIS — R5381 Other malaise: Secondary | ICD-10-CM

## 2013-07-23 DIAGNOSIS — C679 Malignant neoplasm of bladder, unspecified: Secondary | ICD-10-CM

## 2013-07-23 DIAGNOSIS — Z5111 Encounter for antineoplastic chemotherapy: Secondary | ICD-10-CM

## 2013-07-23 DIAGNOSIS — R5383 Other fatigue: Secondary | ICD-10-CM | POA: Diagnosis not present

## 2013-07-23 DIAGNOSIS — C779 Secondary and unspecified malignant neoplasm of lymph node, unspecified: Secondary | ICD-10-CM | POA: Diagnosis not present

## 2013-07-23 DIAGNOSIS — C50919 Malignant neoplasm of unspecified site of unspecified female breast: Secondary | ICD-10-CM

## 2013-07-23 DIAGNOSIS — R911 Solitary pulmonary nodule: Secondary | ICD-10-CM

## 2013-07-23 LAB — COMPREHENSIVE METABOLIC PANEL (CC13)
ALBUMIN: 3.3 g/dL — AB (ref 3.5–5.0)
ALK PHOS: 116 U/L (ref 40–150)
ALT: 33 U/L (ref 0–55)
AST: 26 U/L (ref 5–34)
Anion Gap: 9 mEq/L (ref 3–11)
BILIRUBIN TOTAL: 0.24 mg/dL (ref 0.20–1.20)
BUN: 20.1 mg/dL (ref 7.0–26.0)
CO2: 25 mEq/L (ref 22–29)
Calcium: 8.9 mg/dL (ref 8.4–10.4)
Chloride: 106 mEq/L (ref 98–109)
Creatinine: 1.3 mg/dL (ref 0.7–1.3)
Glucose: 126 mg/dl (ref 70–140)
POTASSIUM: 4.6 meq/L (ref 3.5–5.1)
Sodium: 140 mEq/L (ref 136–145)
TOTAL PROTEIN: 6.7 g/dL (ref 6.4–8.3)

## 2013-07-23 LAB — CBC WITH DIFFERENTIAL/PLATELET
BASO%: 0.5 % (ref 0.0–2.0)
Basophils Absolute: 0 10*3/uL (ref 0.0–0.1)
EOS%: 2 % (ref 0.0–7.0)
Eosinophils Absolute: 0.1 10*3/uL (ref 0.0–0.5)
HCT: 33.3 % — ABNORMAL LOW (ref 38.4–49.9)
HGB: 11.4 g/dL — ABNORMAL LOW (ref 13.0–17.1)
LYMPH%: 39 % (ref 14.0–49.0)
MCH: 29.9 pg (ref 27.2–33.4)
MCHC: 34.1 g/dL (ref 32.0–36.0)
MCV: 87.9 fL (ref 79.3–98.0)
MONO#: 0.8 10*3/uL (ref 0.1–0.9)
MONO%: 17.2 % — ABNORMAL HIGH (ref 0.0–14.0)
NEUT#: 1.9 10*3/uL (ref 1.5–6.5)
NEUT%: 41.3 % (ref 39.0–75.0)
PLATELETS: 197 10*3/uL (ref 140–400)
RBC: 3.79 10*6/uL — ABNORMAL LOW (ref 4.20–5.82)
RDW: 14 % (ref 11.0–14.6)
WBC: 4.5 10*3/uL (ref 4.0–10.3)
lymph#: 1.8 10*3/uL (ref 0.9–3.3)

## 2013-07-23 MED ORDER — SODIUM CHLORIDE 0.9 % IJ SOLN
10.0000 mL | INTRAMUSCULAR | Status: DC | PRN
  Administered 2013-07-23: 10 mL
  Filled 2013-07-23: qty 10

## 2013-07-23 MED ORDER — MANNITOL 25 % IV SOLN
Freq: Once | INTRAVENOUS | Status: AC
  Administered 2013-07-23: 11:00:00 via INTRAVENOUS
  Filled 2013-07-23: qty 10

## 2013-07-23 MED ORDER — DEXAMETHASONE SODIUM PHOSPHATE 20 MG/5ML IJ SOLN
12.0000 mg | Freq: Once | INTRAMUSCULAR | Status: AC
  Administered 2013-07-23: 12 mg via INTRAVENOUS

## 2013-07-23 MED ORDER — SODIUM CHLORIDE 0.9 % IV SOLN
Freq: Once | INTRAVENOUS | Status: AC
  Administered 2013-07-23: 11:00:00 via INTRAVENOUS

## 2013-07-23 MED ORDER — DEXAMETHASONE SODIUM PHOSPHATE 20 MG/5ML IJ SOLN
INTRAMUSCULAR | Status: AC
  Filled 2013-07-23: qty 5

## 2013-07-23 MED ORDER — HEPARIN SOD (PORK) LOCK FLUSH 100 UNIT/ML IV SOLN
500.0000 [IU] | Freq: Once | INTRAVENOUS | Status: AC | PRN
  Administered 2013-07-23: 500 [IU]
  Filled 2013-07-23: qty 5

## 2013-07-23 MED ORDER — PALONOSETRON HCL INJECTION 0.25 MG/5ML
INTRAVENOUS | Status: AC
  Filled 2013-07-23: qty 5

## 2013-07-23 MED ORDER — SODIUM CHLORIDE 0.9 % IV SOLN
1000.0000 mg/m2 | Freq: Once | INTRAVENOUS | Status: AC
  Administered 2013-07-23: 2128 mg via INTRAVENOUS
  Filled 2013-07-23: qty 56

## 2013-07-23 MED ORDER — SODIUM CHLORIDE 0.9 % IV SOLN
71.0000 mg/m2 | Freq: Once | INTRAVENOUS | Status: AC
  Administered 2013-07-23: 150 mg via INTRAVENOUS
  Filled 2013-07-23: qty 150

## 2013-07-23 MED ORDER — PALONOSETRON HCL INJECTION 0.25 MG/5ML
0.2500 mg | Freq: Once | INTRAVENOUS | Status: AC
  Administered 2013-07-23: 0.25 mg via INTRAVENOUS

## 2013-07-23 MED ORDER — SODIUM CHLORIDE 0.9 % IV SOLN
150.0000 mg | Freq: Once | INTRAVENOUS | Status: AC
  Administered 2013-07-23: 150 mg via INTRAVENOUS
  Filled 2013-07-23: qty 5

## 2013-07-23 NOTE — Telephone Encounter (Signed)
gave pt apt for lab and MD for 08/17/13

## 2013-07-23 NOTE — Patient Instructions (Signed)
[facility] Discharge Instructions for Patients Receiving Chemotherapy  Today you received the following chemotherapy agents;  Cisplatin and Gemzar.    To help prevent nausea and vomiting after your treatment, we encourage you to take your nausea medication,  Zofran and Compazine as directed.     If you develop nausea and vomiting that is not controlled by your nausea medication, call the clinic.   BELOW ARE SYMPTOMS THAT SHOULD BE REPORTED IMMEDIATELY:  *FEVER GREATER THAN 100.5 F  *CHILLS WITH OR WITHOUT FEVER  NAUSEA AND VOMITING THAT IS NOT CONTROLLED WITH YOUR NAUSEA MEDICATION  *UNUSUAL SHORTNESS OF BREATH  *UNUSUAL BRUISING OR BLEEDING  TENDERNESS IN MOUTH AND THROAT WITH OR WITHOUT PRESENCE OF ULCERS  *URINARY PROBLEMS  *BOWEL PROBLEMS  UNUSUAL RASH Items with * indicate a potential emergency and should be followed up as soon as possible.  Feel free to call the clinic you have any questions or concerns. The clinic phone number is [phone].

## 2013-07-23 NOTE — Progress Notes (Signed)
Hematology and Oncology Follow Up Visit  [name] [id number] 10/26/1945 68 y.o. 07/23/2013 10:16 AM [name],[name], MDPickard, Austin Mcgee, MD   Principle Diagnosis: 68 year old gentleman with muscle invasive bladder cancer diagnosed in October of 2014 presented with a T2 N0 disease.  Prior Therapy: He is status post TURBT done on 04/21/2013 and the pathology showed invasive high-grade papillary urothelial carcinoma.  Current therapy: He is on neoadjuvant chemotherapy with cisplatinum and Gemzar started on 06/11/2013. He is here for an evaluation before cycle 3 day 1.   Interim History: Mr. [name] presents today for a followup visit, he is a gentleman with the above diagnosis and he is status post of a previous cycle of chemotherapy. He tolerated very well without any complications. He has not reported any infusion-related complications or peripheral neuropathy. His urinary and renal function have been normal with creatinine have normalized since his last visit. He has not reported any nausea or vomiting or abdominal pain. Has not reported any genitourinary complaints. His Port-A-Cath is in place without any complications. He is ready to proceed with the next cycle of chemotherapy. He reported some mild fatigue that was manageable for the most part. He has not reported any neurological symptoms or hearing deficits.  Medications: I have reviewed the patient's current medications.  Current Outpatient Prescriptions  Medication Sig Dispense Refill  . alfuzosin (UROXATRAL) 10 MG 24 hr tablet Take 10 mg by mouth daily with breakfast.      . aspirin 81 MG tablet Take 81 mg by mouth daily.      . citalopram (CELEXA) 20 MG tablet Take 20 mg by mouth every morning.      . clonazePAM (KLONOPIN) 0.5 MG tablet Take 0.5 mg by mouth at bedtime.      . eszopiclone (LUNESTA) 1 MG TABS tablet Take 1 mg by mouth at bedtime as needed for sleep (sleep). Take immediately before bedtime      . ezetimibe (ZETIA) 10  MG tablet Take 10 mg by mouth every morning.      . fluticasone (FLONASE) 50 MCG/ACT nasal spray Place 2 sprays into the nose daily.      [name] lidocaine-prilocaine (EMLA) cream Apply 1 application topically as needed. Apply to port before chemotherapy.      . Omega-3 Fatty Acids (FISH OIL) 1200 MG CAPS Take 1,200 mg by mouth 2 (two) times daily.      [name] omeprazole (PRILOSEC) 20 MG capsule Take 20 mg by mouth daily.      . ondansetron (ZOFRAN) 8 MG tablet Take 1 tablet (8 mg total) by mouth every 8 (eight) hours as needed for nausea or vomiting.  20 tablet  0  . prochlorperazine (COMPAZINE) 10 MG tablet Take 1 tablet (10 mg total) by mouth every 6 (six) hours as needed for nausea or vomiting.  30 tablet  2  . rOPINIRole (REQUIP) 3 MG tablet Take 3 mg by mouth at bedtime.       No current facility-administered medications for this visit.     Allergies:  Allergies  Allergen Reactions  . Augmentin [Amoxicillin-Pot Clavulanate] Nausea And Vomiting  . Zocor [Simvastatin] Other (See Comments)    myalgia    Past Medical History, Surgical history, Social history, and Family History were reviewed and updated.  Review of Systems:  Physical Exam: Blood pressure 127/73, pulse 71, temperature 97.7 F (36.5 C), temperature source Oral, resp. rate 18, height 5\' 8"  (1.727 m), weight 203 lb 6.4 oz (92.262 kg). ECOG: 0  General appearance: alert, cooperative and appears stated age Head: Normocephalic, without obvious abnormality, atraumatic Neck: no adenopathy, no carotid bruit, no JVD, supple, symmetrical, trachea midline and thyroid not enlarged, symmetric, no tenderness/mass/nodules Lymph nodes: Cervical, supraclavicular, and axillary nodes normal. Heart:regular rate and rhythm, S1, S2 normal, no murmur, click, rub or gallop Lung:chest clear, no wheezing, rales, normal symmetric air entry Abdomin: soft, non-tender, without masses or organomegaly EXT:no erythema, induration, or nodules   Lab  Results: Lab Results  Component Value Date   WBC 4.5 07/23/2013   HGB 11.4* 07/23/2013   HCT 33.3* 07/23/2013   MCV 87.9 07/23/2013   PLT 197 07/23/2013     Chemistry      Component Value Date/Time   NA 140 07/23/2013 0937   NA 137 06/09/2013 1205   K 4.6 07/23/2013 0937   K 4.1 06/09/2013 1205   CL 105 06/09/2013 1205   CO2 25 07/23/2013 0937   CO2 23 06/09/2013 1205   BUN 20.1 07/23/2013 0937   BUN 22 06/09/2013 1205   CREATININE 1.3 07/23/2013 0937   CREATININE 1.46* 06/09/2013 1205   CREATININE 1.66* 05/19/2013 1005      Component Value Date/Time   CALCIUM 8.9 07/23/2013 0937   CALCIUM 9.0 06/09/2013 1205   ALKPHOS 116 07/23/2013 0937   ALKPHOS 96 03/15/2013 1000   AST 26 07/23/2013 0937   AST 18 03/15/2013 1000   ALT 33 07/23/2013 0937   ALT 16 03/15/2013 1000   BILITOT 0.24 07/23/2013 0937   BILITOT 0.4 03/15/2013 1000        Impression and Plan:  68 year old with the following issues:  1. T2 N0 muscle invasive bladder cancer diagnosed in October of 2014 after he presented with hematuria. His staging workup did not reveal any evidence of metastatic disease and he is under consideration for radical cystectomy at [facility].  He is status post the first cycle of neoadjuvant chemotherapy and ready to proceed with the second cycle. He'll receive day 1 of cycle 3 with any delay. His laboratory data were reviewed today as well.   2. Renal insufficiency: His last creatinine have normalized and he is ready to proceed with cisplatin.  3. IV access he has a Port-A-Cath inserted without any complications.  4. Followup: Will be on 07/30/2013 for the next cycle of chemotherapy and then a M.D. visit on 08/17/2013.    [facility], MD 1/16/201510:16 AM

## 2013-07-26 ENCOUNTER — Telehealth: Payer: Self-pay | Admitting: Family Medicine

## 2013-07-26 DIAGNOSIS — M109 Gout, unspecified: Secondary | ICD-10-CM

## 2013-07-26 MED ORDER — PREDNISONE 20 MG PO TABS: ORAL_TABLET | ORAL | Status: DC

## 2013-07-26 NOTE — Telephone Encounter (Signed)
Pt is wanting a refill on his prednisone his gout is acting up again and he can barley put pressure on it he states that  Olean Ree wrote the prescription for him  Call back number is [phone]  Pharmacy is [facility] on [address] [facility] [address]

## 2013-07-26 NOTE — Telephone Encounter (Signed)
Pt sure it is another gout attack.  Prednisone refilled per December visit.  20 mg 3/dayx2, 2/dayx2, 1/dx2.  Pt told if condition worsens or not improved at end of treatment will need to be seen

## 2013-07-26 NOTE — Telephone Encounter (Signed)
Discuss with patient to make sure that this is not possibly secondary to any type of infection in that area rather than gout. If he feels quite certain that the area is not  Infected, and that this is gout,  then he can go ahead and take another round of prednisone.(Send in Rx re-order) Tell patient that if this site worsens at all or does not resolve after completion of this round of prednisone, then come in for office evaluation immediately.

## 2013-07-30 ENCOUNTER — Other Ambulatory Visit (HOSPITAL_BASED_OUTPATIENT_CLINIC_OR_DEPARTMENT_OTHER): Payer: Medicare Other

## 2013-07-30 ENCOUNTER — Ambulatory Visit (HOSPITAL_BASED_OUTPATIENT_CLINIC_OR_DEPARTMENT_OTHER): Payer: Medicare Other | Admitting: Oncology

## 2013-07-30 ENCOUNTER — Encounter: Payer: Self-pay | Admitting: Oncology

## 2013-07-30 ENCOUNTER — Ambulatory Visit (HOSPITAL_BASED_OUTPATIENT_CLINIC_OR_DEPARTMENT_OTHER): Payer: Medicare Other

## 2013-07-30 VITALS — BP 139/76 | HR 70 | Temp 97.6°F | Resp 20 | Ht 68.0 in | Wt 200.9 lb

## 2013-07-30 DIAGNOSIS — C679 Malignant neoplasm of bladder, unspecified: Secondary | ICD-10-CM

## 2013-07-30 DIAGNOSIS — C779 Secondary and unspecified malignant neoplasm of lymph node, unspecified: Secondary | ICD-10-CM

## 2013-07-30 DIAGNOSIS — C50919 Malignant neoplasm of unspecified site of unspecified female breast: Secondary | ICD-10-CM

## 2013-07-30 DIAGNOSIS — N289 Disorder of kidney and ureter, unspecified: Secondary | ICD-10-CM | POA: Diagnosis not present

## 2013-07-30 DIAGNOSIS — Z5111 Encounter for antineoplastic chemotherapy: Secondary | ICD-10-CM | POA: Diagnosis not present

## 2013-07-30 DIAGNOSIS — C671 Malignant neoplasm of dome of bladder: Secondary | ICD-10-CM

## 2013-07-30 DIAGNOSIS — R911 Solitary pulmonary nodule: Secondary | ICD-10-CM

## 2013-07-30 LAB — COMPREHENSIVE METABOLIC PANEL (CC13)
ALBUMIN: 3.7 g/dL (ref 3.5–5.0)
ALK PHOS: 113 U/L (ref 40–150)
ALT: 26 U/L (ref 0–55)
AST: 20 U/L (ref 5–34)
Anion Gap: 12 mEq/L — ABNORMAL HIGH (ref 3–11)
BUN: 23.6 mg/dL (ref 7.0–26.0)
CALCIUM: 9.5 mg/dL (ref 8.4–10.4)
CHLORIDE: 104 meq/L (ref 98–109)
CO2: 25 mEq/L (ref 22–29)
CREATININE: 1.3 mg/dL (ref 0.7–1.3)
GLUCOSE: 192 mg/dL — AB (ref 70–140)
POTASSIUM: 5.1 meq/L (ref 3.5–5.1)
Sodium: 141 mEq/L (ref 136–145)
Total Bilirubin: 0.3 mg/dL (ref 0.20–1.20)
Total Protein: 7.4 g/dL (ref 6.4–8.3)

## 2013-07-30 LAB — CBC WITH DIFFERENTIAL/PLATELET
BASO%: 0.5 % (ref 0.0–2.0)
BASOS ABS: 0 10*3/uL (ref 0.0–0.1)
EOS ABS: 0 10*3/uL (ref 0.0–0.5)
EOS%: 0 % (ref 0.0–7.0)
HEMATOCRIT: 34.5 % — AB (ref 38.4–49.9)
HEMOGLOBIN: 11.6 g/dL — AB (ref 13.0–17.1)
LYMPH%: 29 % (ref 14.0–49.0)
MCH: 29.8 pg (ref 27.2–33.4)
MCHC: 33.6 g/dL (ref 32.0–36.0)
MCV: 88.6 fL (ref 79.3–98.0)
MONO#: 0.1 10*3/uL (ref 0.1–0.9)
MONO%: 2.3 % (ref 0.0–14.0)
NEUT%: 68.2 % (ref 39.0–75.0)
NEUTROS ABS: 2.9 10*3/uL (ref 1.5–6.5)
Platelets: 286 10*3/uL (ref 140–400)
RBC: 3.89 10*6/uL — ABNORMAL LOW (ref 4.20–5.82)
RDW: 15.1 % — ABNORMAL HIGH (ref 11.0–14.6)
WBC: 4.2 10*3/uL (ref 4.0–10.3)
lymph#: 1.2 10*3/uL (ref 0.9–3.3)

## 2013-07-30 MED ORDER — PROCHLORPERAZINE MALEATE 10 MG PO TABS
ORAL_TABLET | ORAL | Status: AC
  Filled 2013-07-30: qty 1

## 2013-07-30 MED ORDER — SODIUM CHLORIDE 0.9 % IJ SOLN
10.0000 mL | INTRAMUSCULAR | Status: DC | PRN
  Administered 2013-07-30: 10 mL
  Filled 2013-07-30: qty 10

## 2013-07-30 MED ORDER — PROCHLORPERAZINE MALEATE 10 MG PO TABS
10.0000 mg | ORAL_TABLET | Freq: Once | ORAL | Status: AC
  Administered 2013-07-30: 10 mg via ORAL

## 2013-07-30 MED ORDER — SODIUM CHLORIDE 0.9 % IV SOLN
Freq: Once | INTRAVENOUS | Status: AC
  Administered 2013-07-30: 12:00:00 via INTRAVENOUS

## 2013-07-30 MED ORDER — HEPARIN SOD (PORK) LOCK FLUSH 100 UNIT/ML IV SOLN
500.0000 [IU] | Freq: Once | INTRAVENOUS | Status: AC | PRN
  Administered 2013-07-30: 500 [IU]
  Filled 2013-07-30: qty 5

## 2013-07-30 MED ORDER — SODIUM CHLORIDE 0.9 % IV SOLN
1000.0000 mg/m2 | Freq: Once | INTRAVENOUS | Status: AC
  Administered 2013-07-30: 2128 mg via INTRAVENOUS
  Filled 2013-07-30: qty 56

## 2013-07-30 NOTE — Patient Instructions (Signed)
[facility] Discharge Instructions for Patients Receiving Chemotherapy  Today you received the following chemotherapy agents Gemzar.  To help prevent nausea and vomiting after your treatment, we encourage you to take your nausea medication.   If you develop nausea and vomiting that is not controlled by your nausea medication, call the clinic.   BELOW ARE SYMPTOMS THAT SHOULD BE REPORTED IMMEDIATELY:  *FEVER GREATER THAN 100.5 F  *CHILLS WITH OR WITHOUT FEVER  NAUSEA AND VOMITING THAT IS NOT CONTROLLED WITH YOUR NAUSEA MEDICATION  *UNUSUAL SHORTNESS OF BREATH  *UNUSUAL BRUISING OR BLEEDING  TENDERNESS IN MOUTH AND THROAT WITH OR WITHOUT PRESENCE OF ULCERS  *URINARY PROBLEMS  *BOWEL PROBLEMS  UNUSUAL RASH Items with * indicate a potential emergency and should be followed up as soon as possible.  Feel free to call the clinic you have any questions or concerns. The clinic phone number is [phone].    

## 2013-07-30 NOTE — Progress Notes (Signed)
Hematology and Oncology Follow Up Visit  PACEN WATFORD [id number] 1946/02/10 68 y.o. 07/30/2013 11:37 AM [name],[name], MDPickard, [name], MD   Principle Diagnosis: 68 year old gentleman with muscle invasive bladder cancer diagnosed in October of 2014 presented with a T2 N0 disease.  Prior Therapy: He is status post TURBT done on 04/21/2013 and the pathology showed invasive high-grade papillary urothelial carcinoma.  Current therapy: He is on neoadjuvant chemotherapy with cisplatinum and Gemzar started on 06/11/2013. He is here for an evaluation before cycle 3 day 8.   Interim History: Mr. Austin [name] presents today for a followup visit, he is a gentleman with the above diagnosis and he is status post of a previous cycle of chemotherapy. He tolerated very well without any complications. He has not reported any infusion-related complications or peripheral neuropathy. His urinary and renal function have been normal with creatinine have normalized since his last visit. He has not reported any nausea or vomiting or abdominal pain. Has not reported any genitourinary complaints. His Port-A-Cath is in place without any complications. He is ready to proceed with the next cycle of chemotherapy. He reported some mild fatigue that was manageable for the most part. He has not reported any neurological symptoms or hearing deficits.  Medications: I have reviewed the patient's current medications.  Current Outpatient Prescriptions  Medication Sig Dispense Refill  . alfuzosin (UROXATRAL) 10 MG 24 hr tablet Take 10 mg by mouth daily with breakfast.      . aspirin 81 MG tablet Take 81 mg by mouth daily.      . citalopram (CELEXA) 20 MG tablet Take 20 mg by mouth every morning.      . clonazePAM (KLONOPIN) 0.5 MG tablet Take 0.5 mg by mouth at bedtime.      . eszopiclone (LUNESTA) 1 MG TABS tablet Take 1 mg by mouth at bedtime as needed for sleep (sleep). Take immediately before bedtime      . ezetimibe (ZETIA) 10  MG tablet Take 10 mg by mouth every morning.      . fluticasone (FLONASE) 50 MCG/ACT nasal spray Place 2 sprays into the nose daily.      [name] lidocaine-prilocaine (EMLA) cream Apply 1 application topically as needed. Apply to port before chemotherapy.      . Omega-3 Fatty Acids (FISH OIL) 1200 MG CAPS Take 1,200 mg by mouth 2 (two) times daily.      [name] omeprazole (PRILOSEC) 20 MG capsule Take 20 mg by mouth daily.      . ondansetron (ZOFRAN) 8 MG tablet Take 1 tablet (8 mg total) by mouth every 8 (eight) hours as needed for nausea or vomiting.  20 tablet  0  . predniSONE (DELTASONE) 20 MG tablet Three tablets a day for two days, two tablets a day for two days, one tablet a day for two days  20 tablet  0  . prochlorperazine (COMPAZINE) 10 MG tablet Take 1 tablet (10 mg total) by mouth every 6 (six) hours as needed for nausea or vomiting.  30 tablet  2  . rOPINIRole (REQUIP) 3 MG tablet Take 3 mg by mouth at bedtime.       No current facility-administered medications for this visit.     Allergies:  Allergies  Allergen Reactions  . Augmentin [Amoxicillin-Pot Clavulanate] Nausea And Vomiting  . Zocor [Simvastatin] Other (See Comments)    myalgia    Past Medical History, Surgical history, Social history, and Family History were reviewed and updated.  Review of Systems:  Physical Exam: Blood pressure 139/76, pulse 70, temperature 97.6 F (36.4 C), temperature source Oral, resp. rate 20, height 5\' 8"  (1.727 m), weight 200 lb 14.4 oz (91.128 kg), SpO2 100.00%. ECOG: 0 General appearance: alert, cooperative and appears stated age Head: Normocephalic, without obvious abnormality, atraumatic Neck: no adenopathy, no carotid bruit, no JVD, supple, symmetrical, trachea midline and thyroid not enlarged, symmetric, no tenderness/mass/nodules Lymph nodes: Cervical, supraclavicular, and axillary nodes normal. Heart:regular rate and rhythm, S1, S2 normal, no murmur, click, rub or gallop Lung:chest  clear, no wheezing, rales, normal symmetric air entry Abdomen: soft, non-tender, without masses or organomegaly EXT:no erythema, induration, or nodules   Lab Results: Lab Results  Component Value Date   WBC 4.2 07/30/2013   HGB 11.6* 07/30/2013   HCT 34.5* 07/30/2013   MCV 88.6 07/30/2013   PLT 286 07/30/2013     Chemistry      Component Value Date/Time   NA 140 07/23/2013 0937   NA 137 06/09/2013 1205   K 4.6 07/23/2013 0937   K 4.1 06/09/2013 1205   CL 105 06/09/2013 1205   CO2 25 07/23/2013 0937   CO2 23 06/09/2013 1205   BUN 20.1 07/23/2013 0937   BUN 22 06/09/2013 1205   CREATININE 1.3 07/23/2013 0937   CREATININE 1.46* 06/09/2013 1205   CREATININE 1.66* 05/19/2013 1005      Component Value Date/Time   CALCIUM 8.9 07/23/2013 0937   CALCIUM 9.0 06/09/2013 1205   ALKPHOS 116 07/23/2013 0937   ALKPHOS 96 03/15/2013 1000   AST 26 07/23/2013 0937   AST 18 03/15/2013 1000   ALT 33 07/23/2013 0937   ALT 16 03/15/2013 1000   BILITOT 0.24 07/23/2013 0937   BILITOT 0.4 03/15/2013 1000        Impression and Plan:  68 year old with the following issues:  1. T2 N0 muscle invasive bladder cancer diagnosed in October of 2014 after he presented with hematuria. His staging workup did not reveal any evidence of metastatic disease and he is under consideration for radical cystectomy at [facility]  He is status post 2 cycles of neoadjuvant chemotherapy and ready to proceed with the second cycle. He will receive day 8 of cycle 3 with any delay. His laboratory data were reviewed today as well.   2. Renal insufficiency: His last creatinine have normalized.  3. IV access he has a Port-A-Cath inserted without any complications.  4. Followup: M.D. visit on 08/17/2013.    [place], [place] 1/23/201511:37 AM

## 2013-08-02 NOTE — Letter (Unsigned)
June 29, 2013    To whom it may concern:  NAME:  TRYSTON, GILLIAM MRN:  [id number] DOB:  1946-02-22  This is a letter on behalf of Mr. Johnney Scarlata.  Mr. Brindle is diagnosed with cancer and he is undergoing therapy for it at the [facility].  He underwent a surgery on June 09, 2013, for a Port-A- Cath insertion in preparation to start chemotherapy, which started on June 11, 2013.  Chemotherapy is planned to continue through July 30, 2013.  After a rest period of about 4-6 weeks, he will undergo bladder surgery to treat his cancer.  Should you have any questions regarding this letter do not hesitate to contact me.  Sincerely yours,    Wyatt Portela, M.D.  FNS/MEDQ  D:  06/29/2013  T:  06/29/2013  Job:  [id number]

## 2013-08-09 DIAGNOSIS — F4323 Adjustment disorder with mixed anxiety and depressed mood: Secondary | ICD-10-CM | POA: Diagnosis not present

## 2013-08-13 ENCOUNTER — Encounter: Payer: Self-pay | Admitting: *Deleted

## 2013-08-17 ENCOUNTER — Other Ambulatory Visit (HOSPITAL_BASED_OUTPATIENT_CLINIC_OR_DEPARTMENT_OTHER): Payer: Medicare Other

## 2013-08-17 ENCOUNTER — Ambulatory Visit (HOSPITAL_BASED_OUTPATIENT_CLINIC_OR_DEPARTMENT_OTHER): Payer: Medicare Other | Admitting: Oncology

## 2013-08-17 ENCOUNTER — Encounter: Payer: Self-pay | Admitting: Oncology

## 2013-08-17 ENCOUNTER — Other Ambulatory Visit: Payer: Medicare Other

## 2013-08-17 ENCOUNTER — Telehealth: Payer: Self-pay | Admitting: Oncology

## 2013-08-17 VITALS — BP 139/75 | HR 64 | Temp 97.2°F | Resp 18 | Ht 68.0 in | Wt 206.8 lb

## 2013-08-17 DIAGNOSIS — N289 Disorder of kidney and ureter, unspecified: Secondary | ICD-10-CM

## 2013-08-17 DIAGNOSIS — M109 Gout, unspecified: Secondary | ICD-10-CM | POA: Diagnosis not present

## 2013-08-17 DIAGNOSIS — C671 Malignant neoplasm of dome of bladder: Secondary | ICD-10-CM

## 2013-08-17 DIAGNOSIS — C679 Malignant neoplasm of bladder, unspecified: Secondary | ICD-10-CM

## 2013-08-17 LAB — CBC WITH DIFFERENTIAL/PLATELET
BASO%: 1 % (ref 0.0–2.0)
Basophils Absolute: 0.1 10*3/uL (ref 0.0–0.1)
EOS ABS: 0 10*3/uL (ref 0.0–0.5)
EOS%: 0.3 % (ref 0.0–7.0)
HCT: 31.1 % — ABNORMAL LOW (ref 38.4–49.9)
HGB: 10.5 g/dL — ABNORMAL LOW (ref 13.0–17.1)
LYMPH#: 1.7 10*3/uL (ref 0.9–3.3)
LYMPH%: 27 % (ref 14.0–49.0)
MCH: 30.7 pg (ref 27.2–33.4)
MCHC: 33.7 g/dL (ref 32.0–36.0)
MCV: 91.1 fL (ref 79.3–98.0)
MONO#: 1 10*3/uL — ABNORMAL HIGH (ref 0.1–0.9)
MONO%: 15 % — ABNORMAL HIGH (ref 0.0–14.0)
NEUT#: 3.6 10*3/uL (ref 1.5–6.5)
NEUT%: 56.7 % (ref 39.0–75.0)
Platelets: 247 10*3/uL (ref 140–400)
RBC: 3.41 10*6/uL — AB (ref 4.20–5.82)
RDW: 22.8 % — AB (ref 11.0–14.6)
WBC: 6.4 10*3/uL (ref 4.0–10.3)

## 2013-08-17 LAB — COMPREHENSIVE METABOLIC PANEL (CC13)
ALBUMIN: 3.5 g/dL (ref 3.5–5.0)
ALT: 14 U/L (ref 0–55)
ANION GAP: 9 meq/L (ref 3–11)
AST: 17 U/L (ref 5–34)
Alkaline Phosphatase: 119 U/L (ref 40–150)
BUN: 18.1 mg/dL (ref 7.0–26.0)
CALCIUM: 9.1 mg/dL (ref 8.4–10.4)
CHLORIDE: 107 meq/L (ref 98–109)
CO2: 24 meq/L (ref 22–29)
Creatinine: 1.1 mg/dL (ref 0.7–1.3)
GLUCOSE: 89 mg/dL (ref 70–140)
POTASSIUM: 4.2 meq/L (ref 3.5–5.1)
SODIUM: 140 meq/L (ref 136–145)
TOTAL PROTEIN: 6.7 g/dL (ref 6.4–8.3)
Total Bilirubin: <0.2 mg/dL (ref 0.20–1.20)

## 2013-08-17 NOTE — Telephone Encounter (Signed)
gv pt appt schedule for april. date for 4/21 per pt due to pending surgery @ [facility].

## 2013-08-17 NOTE — Progress Notes (Signed)
Hematology and Oncology Follow Up Visit  [name] [id number] [birth year] 68 y.o. 08/17/2013 4:17 PM [name],[name], MDPickard, Austin Mcgee, MD   Principle Diagnosis: 68 year old gentleman with muscle invasive bladder cancer diagnosed in October of 2014 presented with a T2 N0 disease.  Prior Therapy: He is status post TURBT done on 04/21/2013 and the pathology showed invasive high-grade papillary urothelial carcinoma.  Current therapy: He is on neoadjuvant chemotherapy with cisplatinum and Gemzar started on 06/11/2013. He is status post 3 cycle of chemotherapy completed in January of 2015.  Interim History: Mr. [name] presents today for a followup visit, he is a gentleman with the above diagnosis and he is status post of a previous cycles of chemotherapy. He tolerated very well without any complications. He has not reported any infusion-related complications or peripheral neuropathy. His urinary and renal function have been normal with creatinine have normalized since his last visit. He has not reported any nausea or vomiting or abdominal pain. Has not reported any genitourinary complaints. His Port-A-Cath is in place without any complications. He have resumed most activities of daily living without any hindrance or decline.  Medications: I have reviewed the patient's current medications.  Current Outpatient Prescriptions  Medication Sig Dispense Refill  . alfuzosin (UROXATRAL) 10 MG 24 hr tablet Take 10 mg by mouth daily with breakfast.      . aspirin 81 MG tablet Take 81 mg by mouth daily.      . citalopram (CELEXA) 20 MG tablet Take 20 mg by mouth every morning.      . clonazePAM (KLONOPIN) 0.5 MG tablet Take 0.5 mg by mouth at bedtime.      . eszopiclone (LUNESTA) 1 MG TABS tablet Take 1 mg by mouth at bedtime as needed for sleep (sleep). Take immediately before bedtime      . ezetimibe (ZETIA) 10 MG tablet Take 10 mg by mouth every morning.      . fluticasone (FLONASE) 50 MCG/ACT nasal  spray Place 2 sprays into the nose daily.      [name] lidocaine-prilocaine (EMLA) cream Apply 1 application topically as needed. Apply to port before chemotherapy.      . Omega-3 Fatty Acids (FISH OIL) 1200 MG CAPS Take 1,200 mg by mouth 2 (two) times daily.      [name] omeprazole (PRILOSEC) 20 MG capsule Take 20 mg by mouth daily.      . ondansetron (ZOFRAN) 8 MG tablet Take 1 tablet (8 mg total) by mouth every 8 (eight) hours as needed for nausea or vomiting.  20 tablet  0  . predniSONE (DELTASONE) 20 MG tablet Three tablets a day for two days, two tablets a day for two days, one tablet a day for two days  20 tablet  0  . prochlorperazine (COMPAZINE) 10 MG tablet Take 1 tablet (10 mg total) by mouth every 6 (six) hours as needed for nausea or vomiting.  30 tablet  2  . rOPINIRole (REQUIP) 3 MG tablet Take 3 mg by mouth at bedtime.       No current facility-administered medications for this visit.     Allergies:  Allergies  Allergen Reactions  . Augmentin [Amoxicillin-Pot Clavulanate] Nausea And Vomiting  . Zocor [Simvastatin] Other (See Comments)    myalgia    Past Medical History, Surgical history, Social history, and Family History were reviewed and updated.  Review of Systems:  Physical Exam: Blood pressure 139/75, pulse 64, temperature 97.2 F (36.2 C), temperature source Oral, resp. rate 18,  height 5\' 8"  (1.727 m), weight 206 lb 12.8 oz (93.804 kg), SpO2 100.00%. ECOG: 0 General appearance: alert, cooperative and appears stated age Head: Normocephalic, without obvious abnormality, atraumatic Neck: no adenopathy, no carotid bruit, no JVD, supple, symmetrical, trachea midline and thyroid not enlarged, symmetric, no tenderness/mass/nodules Lymph nodes: Cervical, supraclavicular, and axillary nodes normal. Heart:regular rate and rhythm, S1, S2 normal, no murmur, click, rub or gallop Lung:chest clear, no wheezing, rales, normal symmetric air entry Abdomen: soft, non-tender, without masses  or organomegaly EXT:no erythema, induration, or nodules   Lab Results: Lab Results  Component Value Date   WBC 6.4 08/17/2013   HGB 10.5* 08/17/2013   HCT 31.1* 08/17/2013   MCV 91.1 08/17/2013   PLT 247 08/17/2013     Chemistry      Component Value Date/Time   NA 141 07/30/2013 1056   NA 137 06/09/2013 1205   K 5.1 07/30/2013 1056   K 4.1 06/09/2013 1205   CL 105 06/09/2013 1205   CO2 25 07/30/2013 1056   CO2 23 06/09/2013 1205   BUN 23.6 07/30/2013 1056   BUN 22 06/09/2013 1205   CREATININE 1.3 07/30/2013 1056   CREATININE 1.46* 06/09/2013 1205   CREATININE 1.66* 05/19/2013 1005      Component Value Date/Time   CALCIUM 9.5 07/30/2013 1056   CALCIUM 9.0 06/09/2013 1205   ALKPHOS 113 07/30/2013 1056   ALKPHOS 96 03/15/2013 1000   AST 20 07/30/2013 1056   AST 18 03/15/2013 1000   ALT 26 07/30/2013 1056   ALT 16 03/15/2013 1000   BILITOT 0.30 07/30/2013 1056   BILITOT 0.4 03/15/2013 1000        Impression and Plan:  68 year old with the following issues:  1. T2 N0 muscle invasive bladder cancer diagnosed in October of 2014 after he presented with hematuria. His staging workup did not reveal any evidence of metastatic disease and he is under consideration for radical cystectomy at [facility].  He is status post 3 cycles of neoadjuvant chemotherapy. He is planned to have a repeat CT scan next month at [facility] in anticipation for a cystectomy. The plan is to see him in followup after that is done to discuss further treatment options.   2. Renal insufficiency: His last creatinine have normalized.  3. IV access he has a Port-A-Cath inserted without any complications.  4. Followup: In 2 months after his cystectomy has been completed.    JHERDE,YCXKG 2/10/20154:17 PM

## 2013-08-18 ENCOUNTER — Telehealth: Payer: Self-pay | Admitting: Oncology

## 2013-08-18 ENCOUNTER — Ambulatory Visit (INDEPENDENT_AMBULATORY_CARE_PROVIDER_SITE_OTHER): Payer: Medicare Other | Admitting: Physician Assistant

## 2013-08-18 ENCOUNTER — Encounter: Payer: Self-pay | Admitting: Physician Assistant

## 2013-08-18 VITALS — BP 108/72 | HR 72 | Temp 97.8°F | Resp 18

## 2013-08-18 DIAGNOSIS — C679 Malignant neoplasm of bladder, unspecified: Secondary | ICD-10-CM

## 2013-08-18 DIAGNOSIS — M109 Gout, unspecified: Secondary | ICD-10-CM | POA: Diagnosis not present

## 2013-08-18 LAB — URIC ACID: Uric Acid, Serum: 6.1 mg/dL (ref 4.0–7.8)

## 2013-08-18 MED ORDER — COLCHICINE 0.6 MG PO TABS: ORAL_TABLET | ORAL | Status: DC

## 2013-08-18 MED ORDER — ALLOPURINOL 300 MG PO TABS: 300.0000 mg | ORAL_TABLET | Freq: Every day | ORAL | Status: DC

## 2013-08-18 NOTE — Telephone Encounter (Signed)
Faxed pt medical records to [place] @ [facility]

## 2013-08-18 NOTE — Progress Notes (Signed)
Patient ID: [name] MRN: [id number], DOB: March 03, 1946, 68 y.o. Date of Encounter: 08/18/2013, 8:26 AM    Chief Complaint:  Chief Complaint  Patient presents with  . Gout     HPI: 68 y.o. year old male is here to followup regarding gout.  His last office visit with me was on 06/23/13. At that time he reported that he had a long history of  gout. He said that he thought he had been evaluated for gout in around 1994 when he was in [name]. Reported that since then anytime he began to feel flare he would just use Naprosyn or Aleve and it would resolve. However with the episode he was experiencing at the time of that visit in December 2014 he had been taking these medications but the pain and erythema were not resolving. He reported that his left first toe has been bothering him about 5 days prior to the visit. Then about 4 days prior to the visit his right knee was achy consistent with his prior gout flares. At the time of his visit at the left first toe was flared.  At the time of that visit he reported that he had bladder cancer and was receiving chemotherapy. He had called the oncologist office and let them know that he was having a gout flare and they recommended that he come here for evaluation and treatment. They told him that any treatment we would prescribe would be ok to take with the chemotherapy. He reported that he was receiving IV chemotherapy on Fridays. He went in for chemotherapy for 2 Fridays then skip one Friday--  2 weeks on, 1 week off-- for a total of 6 treatments. He reported that after 6 treatments they planned to remove the bladder. Today he tells me he has completed the chemotherapy. He was receiving chemotherapy locally secondary to transportation to prevent having to go back and forth to [name]. However his main doctors are at [name]. Says he has an appointment there at [name] March 19 for a body scan and then they will plan to do the surgery to remove  the bladder.  At his visit 06/23/13 we treated the current flare with prednisone taper. We discussed there are medications available to take on a daily basis to help his body excrete uric acid to prevent further flares. Also discussed that there are prescription medications available to use for gout flares that are better than the over-the-counter medicines he had been currently using. We planned for him to come back in for office visit to address this  and check a uric acid level when she was off of his chemotherapy.  He recently came in and had the lab work done. He is here for office visit to start the gout treatment. He currently is having no gout flare. However he says that when he does have the flares that that particular joint becomes severely tender to the touch. Says that even when his sheets on his bed touches it--it is extremely painful. Says that the affected joints becomes red and warm and slightly swollen. Symptoms always resolve with over-the-counter anti-inflammatories other than that one episode December 14 which resolved with prednisone.     Home Meds: See attached medication section for any medications that were entered at today's visit. The computer does not put those onto this list.The following list is a list of meds entered prior to today's visit.   Current Outpatient Prescriptions on File Prior to Visit  Medication Sig Dispense Refill  . alfuzosin (UROXATRAL) 10 MG 24 hr tablet Take 10 mg by mouth daily with breakfast.      . aspirin 81 MG tablet Take 81 mg by mouth daily.      . citalopram (CELEXA) 20 MG tablet Take 20 mg by mouth every morning.      . clonazePAM (KLONOPIN) 0.5 MG tablet Take 0.5 mg by mouth at bedtime.      . eszopiclone (LUNESTA) 1 MG TABS tablet Take 1 mg by mouth at bedtime as needed for sleep (sleep). Take immediately before bedtime      . ezetimibe (ZETIA) 10 MG tablet Take 10 mg by mouth every morning.      . fluticasone (FLONASE) 50 MCG/ACT nasal  spray Place 2 sprays into the nose daily.      [name] lidocaine-prilocaine (EMLA) cream Apply 1 application topically as needed. Apply to port before chemotherapy.      . Omega-3 Fatty Acids (FISH OIL) 1200 MG CAPS Take 1,200 mg by mouth 2 (two) times daily.      [name] omeprazole (PRILOSEC) 20 MG capsule Take 20 mg by mouth daily.      . ondansetron (ZOFRAN) 8 MG tablet Take 1 tablet (8 mg total) by mouth every 8 (eight) hours as needed for nausea or vomiting.  20 tablet  0  . prochlorperazine (COMPAZINE) 10 MG tablet Take 1 tablet (10 mg total) by mouth every 6 (six) hours as needed for nausea or vomiting.  30 tablet  2  . rOPINIRole (REQUIP) 3 MG tablet Take 3 mg by mouth at bedtime.       No current facility-administered medications on file prior to visit.    Allergies:  Allergies  Allergen Reactions  . Augmentin [Amoxicillin-Pot Clavulanate] Nausea And Vomiting  . Zocor [Simvastatin] Other (See Comments)    myalgia      Review of Systems: See HPI for pertinent ROS. All other ROS negative.    Physical Exam: Blood pressure 108/72, pulse 72, temperature 97.8 F (36.6 C), resp. rate 18., There is no weight on file to calculate BMI. General: WNWD WM.  Appears in no acute distress. Neck: Supple. No thyromegaly. No lymphadenopathy. Lungs: Clear bilaterally to auscultation without wheezes, rales, or rhonchi. Breathing is unlabored. Heart: Regular rhythm. No murmurs, rubs, or gallops. Abdomen: Soft, non-tender, non-distended with normoactive bowel sounds. No hepatomegaly. No rebound/guarding. No obvious abdominal masses. Msk:  Strength and tone normal for age. Extremities/Skin: Warm and dry. No clubbing or cyanosis. No edema. No rashes or suspicious lesions. Neuro: Alert and oriented X 3. Moves all extremities spontaneously. Gait is normal. CNII-XII grossly in tact. Psych:  Responds to questions appropriately with a normal affect.     ASSESSMENT AND PLAN:  68 y.o. year old male with  1.  Gout Lab on 08/17/13 shows uric acid level  6.1. Renal function is completely normal with BUN 18.1 creatinine 1.1 Discussed with him that this level is not very high. I then considered whether his symptoms could be possibly coming from something besides gout. However, all of his symptoms are consistent with gout and  always resolves with NSAIDs or prednisone --which is consistent with gout. We'll go ahead and treat for gout. I discussed that when he starts the allopurinol it is causing the uric acid to be mobilized in his body which can then cause flares. Therefore, when he is first starting allopurinol he needs to go ahead and take the colchicine twice daily. He  is to take this twice daily for a couple of weeks and then he can try to taper off of this. If has Flare, can go back on the colchicine. For long term,  continue taking allopurinol daily. In the future he will take the colchicine only when he has a flare. At sign of a flare he will take 2 pills and one hour later take 1. Then take it one twice daily until flare reslves.  Followup if he has any concerns or problems. - allopurinol (ZYLOPRIM) 300 MG tablet; Take 1 tablet (300 mg total) by mouth daily.  Dispense: 90 tablet; Refill: 2 - colchicine 0.6 MG tablet; At sign of gout flare, take 2 then one hour later take 1.  Then take 1 twice a day.  Dispense: 60 tablet; Refill: 5  2. Bladder cancer    Signed, Olean Ree [place], [place], [facility] 08/18/2013 8:26 AM

## 2013-08-30 DIAGNOSIS — F4323 Adjustment disorder with mixed anxiety and depressed mood: Secondary | ICD-10-CM | POA: Diagnosis not present

## 2013-09-09 ENCOUNTER — Encounter: Payer: Self-pay | Admitting: Medical Oncology

## 2013-09-09 ENCOUNTER — Telehealth: Payer: Self-pay | Admitting: Medical Oncology

## 2013-09-09 NOTE — Telephone Encounter (Signed)
Patient called requesting letter from Dr Alen Blew to be able to return to work on September 13, 2013. Letter to be addressed to Langley Adie, [employer] at [employer]. Patient to pick up letter Monday March 9th. Letter completed and given to front desk reception for patient to pick up.

## 2013-09-13 ENCOUNTER — Telehealth: Payer: Self-pay | Admitting: *Deleted

## 2013-09-13 ENCOUNTER — Ambulatory Visit: Payer: Medicare Other | Admitting: Physician Assistant

## 2013-09-13 ENCOUNTER — Ambulatory Visit: Payer: Medicare Other | Admitting: Family Medicine

## 2013-09-13 NOTE — Telephone Encounter (Signed)
Patient called requesting if signed letter to return to work today is ready.  Letter is at front desk ready for pick up.  Called patient notifying him he may pick up this letter.

## 2013-09-23 DIAGNOSIS — Z9221 Personal history of antineoplastic chemotherapy: Secondary | ICD-10-CM | POA: Diagnosis not present

## 2013-09-23 DIAGNOSIS — Z01818 Encounter for other preprocedural examination: Secondary | ICD-10-CM | POA: Diagnosis not present

## 2013-09-23 DIAGNOSIS — R918 Other nonspecific abnormal finding of lung field: Secondary | ICD-10-CM | POA: Diagnosis not present

## 2013-09-23 DIAGNOSIS — K219 Gastro-esophageal reflux disease without esophagitis: Secondary | ICD-10-CM | POA: Diagnosis not present

## 2013-09-23 DIAGNOSIS — N3289 Other specified disorders of bladder: Secondary | ICD-10-CM | POA: Diagnosis not present

## 2013-09-23 DIAGNOSIS — R911 Solitary pulmonary nodule: Secondary | ICD-10-CM | POA: Diagnosis not present

## 2013-09-23 DIAGNOSIS — N1330 Unspecified hydronephrosis: Secondary | ICD-10-CM | POA: Diagnosis not present

## 2013-09-23 DIAGNOSIS — C679 Malignant neoplasm of bladder, unspecified: Secondary | ICD-10-CM | POA: Diagnosis not present

## 2013-09-23 DIAGNOSIS — E785 Hyperlipidemia, unspecified: Secondary | ICD-10-CM | POA: Diagnosis not present

## 2013-09-23 DIAGNOSIS — G4733 Obstructive sleep apnea (adult) (pediatric): Secondary | ICD-10-CM | POA: Diagnosis not present

## 2013-10-05 DIAGNOSIS — F329 Major depressive disorder, single episode, unspecified: Secondary | ICD-10-CM | POA: Diagnosis present

## 2013-10-05 DIAGNOSIS — Z7982 Long term (current) use of aspirin: Secondary | ICD-10-CM | POA: Diagnosis not present

## 2013-10-05 DIAGNOSIS — Z5331 Laparoscopic surgical procedure converted to open procedure: Secondary | ICD-10-CM | POA: Diagnosis not present

## 2013-10-05 DIAGNOSIS — Z01818 Encounter for other preprocedural examination: Secondary | ICD-10-CM | POA: Diagnosis not present

## 2013-10-05 DIAGNOSIS — F3289 Other specified depressive episodes: Secondary | ICD-10-CM | POA: Diagnosis present

## 2013-10-05 DIAGNOSIS — Z466 Encounter for fitting and adjustment of urinary device: Secondary | ICD-10-CM | POA: Diagnosis not present

## 2013-10-05 DIAGNOSIS — G2581 Restless legs syndrome: Secondary | ICD-10-CM | POA: Diagnosis present

## 2013-10-05 DIAGNOSIS — G4733 Obstructive sleep apnea (adult) (pediatric): Secondary | ICD-10-CM | POA: Diagnosis present

## 2013-10-05 DIAGNOSIS — Z9221 Personal history of antineoplastic chemotherapy: Secondary | ICD-10-CM | POA: Diagnosis not present

## 2013-10-05 DIAGNOSIS — C671 Malignant neoplasm of dome of bladder: Secondary | ICD-10-CM | POA: Diagnosis present

## 2013-10-05 DIAGNOSIS — M109 Gout, unspecified: Secondary | ICD-10-CM | POA: Diagnosis present

## 2013-10-05 DIAGNOSIS — K219 Gastro-esophageal reflux disease without esophagitis: Secondary | ICD-10-CM | POA: Diagnosis present

## 2013-10-05 DIAGNOSIS — E785 Hyperlipidemia, unspecified: Secondary | ICD-10-CM | POA: Diagnosis present

## 2013-10-05 DIAGNOSIS — C679 Malignant neoplasm of bladder, unspecified: Secondary | ICD-10-CM | POA: Diagnosis not present

## 2013-10-13 ENCOUNTER — Telehealth: Payer: Self-pay | Admitting: Oncology

## 2013-10-13 NOTE — Telephone Encounter (Signed)
per FS req to move pt appt to 2:00.Cld pt and adv of new time same date-pt understood °

## 2013-10-19 DIAGNOSIS — C679 Malignant neoplasm of bladder, unspecified: Secondary | ICD-10-CM | POA: Diagnosis not present

## 2013-10-20 NOTE — Telephone Encounter (Signed)
Open in errors

## 2013-10-25 ENCOUNTER — Telehealth: Payer: Self-pay | Admitting: Hematology and Oncology

## 2013-10-26 ENCOUNTER — Ambulatory Visit (HOSPITAL_BASED_OUTPATIENT_CLINIC_OR_DEPARTMENT_OTHER): Payer: Medicare Other | Admitting: Oncology

## 2013-10-26 ENCOUNTER — Encounter: Payer: Self-pay | Admitting: Oncology

## 2013-10-26 ENCOUNTER — Other Ambulatory Visit (HOSPITAL_BASED_OUTPATIENT_CLINIC_OR_DEPARTMENT_OTHER): Payer: Medicare Other

## 2013-10-26 ENCOUNTER — Ambulatory Visit: Payer: Medicare Other | Admitting: Oncology

## 2013-10-26 ENCOUNTER — Other Ambulatory Visit: Payer: Medicare Other

## 2013-10-26 ENCOUNTER — Telehealth: Payer: Self-pay | Admitting: Oncology

## 2013-10-26 VITALS — BP 127/67 | HR 64 | Temp 97.3°F | Resp 18 | Ht 68.0 in | Wt 196.0 lb

## 2013-10-26 DIAGNOSIS — C679 Malignant neoplasm of bladder, unspecified: Secondary | ICD-10-CM

## 2013-10-26 DIAGNOSIS — N289 Disorder of kidney and ureter, unspecified: Secondary | ICD-10-CM

## 2013-10-26 DIAGNOSIS — C671 Malignant neoplasm of dome of bladder: Secondary | ICD-10-CM | POA: Diagnosis not present

## 2013-10-26 LAB — COMPREHENSIVE METABOLIC PANEL (CC13)
ALT: 19 U/L (ref 0–55)
AST: 17 U/L (ref 5–34)
Albumin: 3.3 g/dL — ABNORMAL LOW (ref 3.5–5.0)
Alkaline Phosphatase: 150 U/L (ref 40–150)
Anion Gap: 9 mEq/L (ref 3–11)
BILIRUBIN TOTAL: 0.21 mg/dL (ref 0.20–1.20)
BUN: 33.4 mg/dL — ABNORMAL HIGH (ref 7.0–26.0)
CO2: 21 mEq/L — ABNORMAL LOW (ref 22–29)
Calcium: 9.8 mg/dL (ref 8.4–10.4)
Chloride: 112 mEq/L — ABNORMAL HIGH (ref 98–109)
Creatinine: 2 mg/dL — ABNORMAL HIGH (ref 0.7–1.3)
Glucose: 111 mg/dl (ref 70–140)
Potassium: 4.9 mEq/L (ref 3.5–5.1)
SODIUM: 142 meq/L (ref 136–145)
Total Protein: 7.6 g/dL (ref 6.4–8.3)

## 2013-10-26 LAB — CBC WITH DIFFERENTIAL/PLATELET
BASO%: 3.1 % — AB (ref 0.0–2.0)
Basophils Absolute: 0.2 10*3/uL — ABNORMAL HIGH (ref 0.0–0.1)
EOS%: 13.1 % — ABNORMAL HIGH (ref 0.0–7.0)
Eosinophils Absolute: 0.8 10*3/uL — ABNORMAL HIGH (ref 0.0–0.5)
HCT: 36.2 % — ABNORMAL LOW (ref 38.4–49.9)
HGB: 11.8 g/dL — ABNORMAL LOW (ref 13.0–17.1)
LYMPH%: 18.7 % (ref 14.0–49.0)
MCH: 30.4 pg (ref 27.2–33.4)
MCHC: 32.6 g/dL (ref 32.0–36.0)
MCV: 93.4 fL (ref 79.3–98.0)
MONO#: 0.5 10*3/uL (ref 0.1–0.9)
MONO%: 7.5 % (ref 0.0–14.0)
NEUT#: 3.6 10*3/uL (ref 1.5–6.5)
NEUT%: 57.6 % (ref 39.0–75.0)
Platelets: 340 10*3/uL (ref 140–400)
RBC: 3.87 10*6/uL — AB (ref 4.20–5.82)
RDW: 14.7 % — ABNORMAL HIGH (ref 11.0–14.6)
WBC: 6.3 10*3/uL (ref 4.0–10.3)
lymph#: 1.2 10*3/uL (ref 0.9–3.3)

## 2013-10-26 NOTE — Telephone Encounter (Signed)
Gave pt appt for lab and MD for June 2015 °

## 2013-10-26 NOTE — Progress Notes (Signed)
Hematology and Oncology Follow Up Visit  [name] [id number] December 20, 1945 68 y.o. 10/26/2013 2:20 PM [name] TOM, MDPickard, Austin Mcgee, MD   Principle Diagnosis: 68 year old gentleman with muscle invasive bladder cancer diagnosed in October of 2014 presented with a T2 N0 disease.  Prior Therapy: He is status post TURBT done on 04/21/2013 and the pathology showed invasive high-grade papillary urothelial carcinoma. He is on neoadjuvant chemotherapy with cisplatinum and Gemzar started on 06/11/2013. He is status post 3 cycle of chemotherapy completed in January of 2015. He underwent a cystectomy on 10/05/2013 at [facility]. The pathology revealed no residual tumor with 12 lymph nodes sampled noninvolved with malignancy.  Current therapy:  Observation and surveillance.  Interim History: [name] presents today for a followup visit, he is a gentleman with the above diagnosis and he is status post surgical resection with a total cystoprostatectomy as mentioned. He tolerated very well without any complications.  He has not reported any nausea or vomiting or abdominal pain. Has not reported any genitourinary complaints. His Port-A-Cath is in place without any complications. He have resumed most activities of daily living without any hindrance or decline. His activity levels are improving slowly since his operation. He is currently receiving Lovenox for prophylaxis purposes.  Medications: I have reviewed the patient's current medications.  Current Outpatient Prescriptions  Medication Sig Dispense Refill  . alfuzosin (UROXATRAL) 10 MG 24 hr tablet Take 10 mg by mouth daily with breakfast.      . allopurinol (ZYLOPRIM) 300 MG tablet Take 1 tablet (300 mg total) by mouth daily.  90 tablet  2  . aspirin 81 MG tablet Take 81 mg by mouth daily.      . citalopram (CELEXA) 20 MG tablet Take 20 mg by mouth every morning.      . clonazePAM (KLONOPIN) 0.5 MG tablet Take 0.5 mg by mouth at bedtime.       . colchicine 0.6 MG tablet At sign of gout flare, take 2 then one hour later take 1.  Then take 1 twice a day.  60 tablet  5  . eszopiclone (LUNESTA) 1 MG TABS tablet Take 1 mg by mouth at bedtime as needed for sleep (sleep). Take immediately before bedtime      . ezetimibe (ZETIA) 10 MG tablet Take 10 mg by mouth every morning.      . fluticasone (FLONASE) 50 MCG/ACT nasal spray Place 2 sprays into the nose daily.      [name] lidocaine-prilocaine (EMLA) cream Apply 1 application topically as needed. Apply to port before chemotherapy.      . Omega-3 Fatty Acids (FISH OIL) 1200 MG CAPS Take 1,200 mg by mouth 2 (two) times daily.      [name] omeprazole (PRILOSEC) 20 MG capsule Take 20 mg by mouth daily.      . ondansetron (ZOFRAN) 8 MG tablet Take 1 tablet (8 mg total) by mouth every 8 (eight) hours as needed for nausea or vomiting.  20 tablet  0  . prochlorperazine (COMPAZINE) 10 MG tablet Take 1 tablet (10 mg total) by mouth every 6 (six) hours as needed for nausea or vomiting.  30 tablet  2  . rOPINIRole (REQUIP) 3 MG tablet Take 3 mg by mouth at bedtime.       No current facility-administered medications for this visit.     Allergies:  Allergies  Allergen Reactions  . Augmentin [Amoxicillin-Pot Clavulanate] Nausea And Vomiting  . Zocor [Simvastatin] Other (See Comments)  myalgia    Past Medical History, Surgical history, Social history, and Family History were reviewed and updated.  Review of Systems:  Physical Exam: Blood pressure 127/67, pulse 64, temperature 97.3 F (36.3 C), temperature source Oral, resp. rate 18, height 5\' 8"  (1.727 m), weight 196 lb (88.905 kg). ECOG: 0 General appearance: alert, cooperative and appears stated age Head: Normocephalic, without obvious abnormality, atraumatic Neck: no adenopathy, no carotid bruit, no JVD, supple, symmetrical, trachea midline and thyroid not enlarged, symmetric, no tenderness/mass/nodules Lymph nodes: Cervical, supraclavicular, and  axillary nodes normal. Heart:regular rate and rhythm, S1, S2 normal, no murmur, click, rub or gallop Lung:chest clear, no wheezing, rales, normal symmetric air entry Abdomen: soft, non-tender, without masses or organomegaly EXT:no erythema, induration, or nodules   Lab Results: Lab Results  Component Value Date   WBC 6.3 10/26/2013   HGB 11.8* 10/26/2013   HCT 36.2* 10/26/2013   MCV 93.4 10/26/2013   PLT 340 10/26/2013     Chemistry      Component Value Date/Time   NA 140 08/17/2013 1535   NA 137 06/09/2013 1205   K 4.2 08/17/2013 1535   K 4.1 06/09/2013 1205   CL 105 06/09/2013 1205   CO2 24 08/17/2013 1535   CO2 23 06/09/2013 1205   BUN 18.1 08/17/2013 1535   BUN 22 06/09/2013 1205   CREATININE 1.1 08/17/2013 1535   CREATININE 1.46* 06/09/2013 1205   CREATININE 1.66* 05/19/2013 1005      Component Value Date/Time   CALCIUM 9.1 08/17/2013 1535   CALCIUM 9.0 06/09/2013 1205   ALKPHOS 119 08/17/2013 1535   ALKPHOS 96 03/15/2013 1000   AST 17 08/17/2013 1535   AST 18 03/15/2013 1000   ALT 14 08/17/2013 1535   ALT 16 03/15/2013 1000   BILITOT <0.20 08/17/2013 1535   BILITOT 0.4 03/15/2013 1000        Impression and Plan:  68 year old with the following issues:  1. T2 N0 muscle invasive bladder cancer diagnosed in October of 2014 after he presented with hematuria. His staging workup did not reveal any evidence of metastatic disease. He underwent a radical cystectomy on 10/05/2013 without any complications. His pathology did not reveal any residual disease at this time I see no indication for any further chemotherapy or adjuvant radiation therapy. Plan to continue active surveillance and repeat imaging studies in the future.  2. Renal insufficiency: His creatinine has been repeated today.  3. IV access he has a Port-A-Cath inserted without any complications. I will flush his Port-A-Cath today and every 2 months.  4. Followup: In 2 months.     [name] 4/21/20152:20 PM

## 2013-10-26 NOTE — Addendum Note (Signed)
Addended by: Randolm Idol on: 10/26/2013 02:48 PM   Modules accepted: Orders, Medications

## 2013-10-29 DIAGNOSIS — Z87891 Personal history of nicotine dependence: Secondary | ICD-10-CM | POA: Diagnosis not present

## 2013-10-29 DIAGNOSIS — F329 Major depressive disorder, single episode, unspecified: Secondary | ICD-10-CM | POA: Diagnosis present

## 2013-10-29 DIAGNOSIS — M109 Gout, unspecified: Secondary | ICD-10-CM | POA: Diagnosis present

## 2013-10-29 DIAGNOSIS — N179 Acute kidney failure, unspecified: Secondary | ICD-10-CM | POA: Diagnosis not present

## 2013-10-29 DIAGNOSIS — Z7982 Long term (current) use of aspirin: Secondary | ICD-10-CM | POA: Diagnosis not present

## 2013-10-29 DIAGNOSIS — R5381 Other malaise: Secondary | ICD-10-CM | POA: Diagnosis not present

## 2013-10-29 DIAGNOSIS — R509 Fever, unspecified: Secondary | ICD-10-CM | POA: Diagnosis not present

## 2013-10-29 DIAGNOSIS — G4733 Obstructive sleep apnea (adult) (pediatric): Secondary | ICD-10-CM | POA: Diagnosis present

## 2013-10-29 DIAGNOSIS — Z9889 Other specified postprocedural states: Secondary | ICD-10-CM | POA: Diagnosis not present

## 2013-10-29 DIAGNOSIS — E785 Hyperlipidemia, unspecified: Secondary | ICD-10-CM | POA: Diagnosis not present

## 2013-10-29 DIAGNOSIS — Z906 Acquired absence of other parts of urinary tract: Secondary | ICD-10-CM | POA: Diagnosis not present

## 2013-10-29 DIAGNOSIS — N390 Urinary tract infection, site not specified: Secondary | ICD-10-CM | POA: Diagnosis not present

## 2013-10-29 DIAGNOSIS — T8140XA Infection following a procedure, unspecified, initial encounter: Secondary | ICD-10-CM | POA: Diagnosis not present

## 2013-10-29 DIAGNOSIS — N1330 Unspecified hydronephrosis: Secondary | ICD-10-CM | POA: Diagnosis not present

## 2013-10-29 DIAGNOSIS — F3289 Other specified depressive episodes: Secondary | ICD-10-CM | POA: Diagnosis present

## 2013-10-29 DIAGNOSIS — T80218A Other infection due to central venous catheter, initial encounter: Secondary | ICD-10-CM | POA: Diagnosis not present

## 2013-10-29 DIAGNOSIS — Z9221 Personal history of antineoplastic chemotherapy: Secondary | ICD-10-CM | POA: Diagnosis not present

## 2013-10-29 DIAGNOSIS — Z452 Encounter for adjustment and management of vascular access device: Secondary | ICD-10-CM | POA: Diagnosis not present

## 2013-10-29 DIAGNOSIS — B961 Klebsiella pneumoniae [K. pneumoniae] as the cause of diseases classified elsewhere: Secondary | ICD-10-CM | POA: Diagnosis not present

## 2013-10-29 DIAGNOSIS — C679 Malignant neoplasm of bladder, unspecified: Secondary | ICD-10-CM | POA: Diagnosis not present

## 2013-10-29 DIAGNOSIS — G2581 Restless legs syndrome: Secondary | ICD-10-CM | POA: Diagnosis present

## 2013-10-29 DIAGNOSIS — Z8551 Personal history of malignant neoplasm of bladder: Secondary | ICD-10-CM | POA: Diagnosis not present

## 2013-10-29 DIAGNOSIS — R5383 Other fatigue: Secondary | ICD-10-CM | POA: Diagnosis not present

## 2013-10-29 DIAGNOSIS — Z8546 Personal history of malignant neoplasm of prostate: Secondary | ICD-10-CM | POA: Diagnosis not present

## 2013-10-29 DIAGNOSIS — K219 Gastro-esophageal reflux disease without esophagitis: Secondary | ICD-10-CM | POA: Diagnosis present

## 2013-10-29 DIAGNOSIS — R7881 Bacteremia: Secondary | ICD-10-CM | POA: Diagnosis not present

## 2013-11-09 DIAGNOSIS — F4323 Adjustment disorder with mixed anxiety and depressed mood: Secondary | ICD-10-CM | POA: Diagnosis not present

## 2013-11-23 DIAGNOSIS — C679 Malignant neoplasm of bladder, unspecified: Secondary | ICD-10-CM | POA: Diagnosis not present

## 2013-12-09 DIAGNOSIS — L738 Other specified follicular disorders: Secondary | ICD-10-CM | POA: Diagnosis not present

## 2013-12-09 DIAGNOSIS — L678 Other hair color and hair shaft abnormalities: Secondary | ICD-10-CM | POA: Diagnosis not present

## 2013-12-16 DIAGNOSIS — F4323 Adjustment disorder with mixed anxiety and depressed mood: Secondary | ICD-10-CM | POA: Diagnosis not present

## 2013-12-28 ENCOUNTER — Telehealth: Payer: Self-pay | Admitting: Oncology

## 2013-12-28 ENCOUNTER — Other Ambulatory Visit (HOSPITAL_BASED_OUTPATIENT_CLINIC_OR_DEPARTMENT_OTHER): Payer: Medicare Other

## 2013-12-28 ENCOUNTER — Encounter: Payer: Self-pay | Admitting: Oncology

## 2013-12-28 ENCOUNTER — Ambulatory Visit (HOSPITAL_BASED_OUTPATIENT_CLINIC_OR_DEPARTMENT_OTHER): Payer: Medicare Other | Admitting: Oncology

## 2013-12-28 VITALS — BP 134/78 | HR 69 | Temp 97.6°F | Resp 18 | Ht 68.0 in | Wt 191.9 lb

## 2013-12-28 DIAGNOSIS — N289 Disorder of kidney and ureter, unspecified: Secondary | ICD-10-CM | POA: Diagnosis not present

## 2013-12-28 DIAGNOSIS — C671 Malignant neoplasm of dome of bladder: Secondary | ICD-10-CM

## 2013-12-28 DIAGNOSIS — C679 Malignant neoplasm of bladder, unspecified: Secondary | ICD-10-CM

## 2013-12-28 DIAGNOSIS — C670 Malignant neoplasm of trigone of bladder: Secondary | ICD-10-CM

## 2013-12-28 LAB — CBC WITH DIFFERENTIAL/PLATELET
BASO%: 0.9 % (ref 0.0–2.0)
Basophils Absolute: 0.1 10*3/uL (ref 0.0–0.1)
EOS ABS: 0.1 10*3/uL (ref 0.0–0.5)
EOS%: 1.6 % (ref 0.0–7.0)
HEMATOCRIT: 38.4 % (ref 38.4–49.9)
HGB: 12.2 g/dL — ABNORMAL LOW (ref 13.0–17.1)
LYMPH%: 18.5 % (ref 14.0–49.0)
MCH: 28.1 pg (ref 27.2–33.4)
MCHC: 31.9 g/dL — ABNORMAL LOW (ref 32.0–36.0)
MCV: 88.1 fL (ref 79.3–98.0)
MONO#: 0.4 10*3/uL (ref 0.1–0.9)
MONO%: 6 % (ref 0.0–14.0)
NEUT%: 73 % (ref 39.0–75.0)
NEUTROS ABS: 5.3 10*3/uL (ref 1.5–6.5)
Platelets: 283 10*3/uL (ref 140–400)
RBC: 4.35 10*6/uL (ref 4.20–5.82)
RDW: 16.7 % — ABNORMAL HIGH (ref 11.0–14.6)
WBC: 7.2 10*3/uL (ref 4.0–10.3)
lymph#: 1.3 10*3/uL (ref 0.9–3.3)

## 2013-12-28 LAB — COMPREHENSIVE METABOLIC PANEL (CC13)
ALK PHOS: 185 U/L — AB (ref 40–150)
ALT: 12 U/L (ref 0–55)
AST: 16 U/L (ref 5–34)
Albumin: 3 g/dL — ABNORMAL LOW (ref 3.5–5.0)
Anion Gap: 8 mEq/L (ref 3–11)
BUN: 33.9 mg/dL — AB (ref 7.0–26.0)
CALCIUM: 9.1 mg/dL (ref 8.4–10.4)
CHLORIDE: 109 meq/L (ref 98–109)
CO2: 21 mEq/L — ABNORMAL LOW (ref 22–29)
CREATININE: 1.9 mg/dL — AB (ref 0.7–1.3)
Glucose: 142 mg/dl — ABNORMAL HIGH (ref 70–140)
Potassium: 4 mEq/L (ref 3.5–5.1)
Sodium: 139 mEq/L (ref 136–145)
Total Bilirubin: 0.32 mg/dL (ref 0.20–1.20)
Total Protein: 6.9 g/dL (ref 6.4–8.3)

## 2013-12-28 NOTE — Telephone Encounter (Signed)
gv and printed appt sched and avs for pt for NOV °

## 2013-12-28 NOTE — Progress Notes (Signed)
Hematology and Oncology Follow Up Visit  [name] [id number] 1946/01/30 68 y.o. 12/28/2013 10:30 AM [name], MDPickard, [name], MD   Principle Diagnosis: 68 year old gentleman with muscle invasive bladder cancer diagnosed in October of 2014 presented with a T2 N0 disease.  Prior Therapy: He is status post TURBT done on 04/21/2013 and the pathology showed invasive high-grade papillary urothelial carcinoma. He is on neoadjuvant chemotherapy with cisplatinum and Gemzar started on 06/11/2013. He is status post 3 cycle of chemotherapy completed in January of 2015. He underwent a cystectomy on 10/05/2013 at [facility]. The pathology revealed no residual tumor with 12 lymph nodes sampled noninvolved with malignancy.  Current therapy:  Observation and surveillance.  Interim History: [name] presents today for a followup visit by himself. Since his last visit, he has been doing well. He did have an episode of bacteremia related to his Port-A-Cath which was removed back in April of 2015. He also had a CT scan done at [facility] which showed no evidence of cancer. Since that episode, he has recovered well without any complaints appear He has not reported any nausea or vomiting or abdominal pain. Has not reported any genitourinary complaints. He have resumed most activities of daily living without any hindrance or decline. His activity levels are improving slowly since his operation. He did not report any headaches or blurry vision or double vision. Does not report any syncope or fainting episodes. Present report any chest pain shortness of breath cough or hemoptysis. Central any nausea or vomiting or abdominal pain. At that point any frequency urgency or hesitancy. Does not report any skeletal complaints. Is not reporting rashes or lesions. Rest of his review of systems unremarkable.  Medications: I have reviewed the patient's current medications.  Current Outpatient Prescriptions   Medication Sig Dispense Refill  . allopurinol (ZYLOPRIM) 300 MG tablet Take 1 tablet (300 mg total) by mouth daily.  90 tablet  2  . aspirin 81 MG tablet Take 81 mg by mouth daily.      . citalopram (CELEXA) 20 MG tablet Take 20 mg by mouth every morning.      . clonazePAM (KLONOPIN) 0.5 MG tablet Take 0.5 mg by mouth at bedtime.      . colchicine 0.6 MG tablet At sign of gout flare, take 2 then one hour later take 1.  Then take 1 twice a day.  60 tablet  5  . eszopiclone (LUNESTA) 1 MG TABS tablet Take 1 mg by mouth at bedtime as needed for sleep (sleep). Take immediately before bedtime      . ezetimibe (ZETIA) 10 MG tablet Take 10 mg by mouth every morning.      . fluticasone (FLONASE) 50 MCG/ACT nasal spray Place 2 sprays into the nose daily.      . Omega-3 Fatty Acids (FISH OIL) 1200 MG CAPS Take 1,200 mg by mouth 2 (two) times daily.      [name] omeprazole (PRILOSEC) 20 MG capsule Take 20 mg by mouth daily.      . ondansetron (ZOFRAN) 8 MG tablet Take 1 tablet (8 mg total) by mouth every 8 (eight) hours as needed for nausea or vomiting.  20 tablet  0  . prochlorperazine (COMPAZINE) 10 MG tablet Take 1 tablet (10 mg total) by mouth every 6 (six) hours as needed for nausea or vomiting.  30 tablet  2  . rOPINIRole (REQUIP) 3 MG tablet Take 3 mg by mouth at bedtime.  No current facility-administered medications for this visit.     Allergies:  Allergies  Allergen Reactions  . Augmentin [Amoxicillin-Pot Clavulanate] Nausea And Vomiting  . Statins Other (See Comments)    Achiness, stiffness per pt  . Zocor [Simvastatin] Other (See Comments)    myalgia    Past Medical History, Surgical history, Social history, and Family History were reviewed and updated.    Physical Exam: Blood pressure 134/78, pulse 69, temperature 97.6 F (36.4 C), temperature source Oral, resp. rate 18, height 5\' 8"  (1.727 m), weight 191 lb 14.4 oz (87.045 kg). ECOG: 0 General appearance: alert Head:  Normocephalic, without obvious abnormality, atraumatic Neck: no adenopathy Lymph nodes: Cervical, supraclavicular, and axillary nodes normal. Heart:regular rate and rhythm, S1, S2 normal, no murmur, click, rub or gallop Lung:chest clear, no wheezing, rales, normal symmetric air entry Abdomen: soft, non-tender, without masses or organomegaly EXT:no erythema, induration, or nodules   Lab Results: Lab Results  Component Value Date   WBC 7.2 12/28/2013   HGB 12.2* 12/28/2013   HCT 38.4 12/28/2013   MCV 88.1 12/28/2013   PLT 283 12/28/2013     Chemistry      Component Value Date/Time   NA 142 10/26/2013 1336   NA 137 06/09/2013 1205   K 4.9 10/26/2013 1336   K 4.1 06/09/2013 1205   CL 105 06/09/2013 1205   CO2 21* 10/26/2013 1336   CO2 23 06/09/2013 1205   BUN 33.4* 10/26/2013 1336   BUN 22 06/09/2013 1205   CREATININE 2.0* 10/26/2013 1336   CREATININE 1.46* 06/09/2013 1205   CREATININE 1.66* 05/19/2013 1005      Component Value Date/Time   CALCIUM 9.8 10/26/2013 1336   CALCIUM 9.0 06/09/2013 1205   ALKPHOS 150 10/26/2013 1336   ALKPHOS 96 03/15/2013 1000   AST 17 10/26/2013 1336   AST 18 03/15/2013 1000   ALT 19 10/26/2013 1336   ALT 16 03/15/2013 1000   BILITOT 0.21 10/26/2013 1336   BILITOT 0.4 03/15/2013 1000        Impression and Plan:  68 year old with the following issues:  1. T2 N0 muscle invasive bladder cancer diagnosed in October of 2014 after he presented with hematuria. He underwent a radical cystectomy on 10/05/2013 without any complications. His pathology did not reveal any residual disease at this time I see no indication for any further chemotherapy or adjuvant radiation therapy. Plan to continue active surveillance. He will have a CT scan done at [facility] in November of 2015. I'll see him after that visit.  2. Renal insufficiency: His creatinine has been repeated today. His baseline creatinine was around 1.46 or so.  3. IV access: His port has been removed due to  possible infection. No further flushes will be required.  4. Followup: In November of 2015 after his next CT scan.    YKDXIP,JASNK 6/23/201510:30 AM

## 2014-01-04 DIAGNOSIS — L678 Other hair color and hair shaft abnormalities: Secondary | ICD-10-CM | POA: Diagnosis not present

## 2014-01-04 DIAGNOSIS — L738 Other specified follicular disorders: Secondary | ICD-10-CM | POA: Diagnosis not present

## 2014-01-04 DIAGNOSIS — L259 Unspecified contact dermatitis, unspecified cause: Secondary | ICD-10-CM | POA: Diagnosis not present

## 2014-01-13 ENCOUNTER — Telehealth: Payer: Self-pay | Admitting: Oncology

## 2014-01-13 NOTE — Telephone Encounter (Signed)
pt called to r/s appt to diff day...pt aware of new d.t

## 2014-01-18 ENCOUNTER — Other Ambulatory Visit: Payer: Self-pay | Admitting: Medical Oncology

## 2014-01-18 ENCOUNTER — Ambulatory Visit (HOSPITAL_BASED_OUTPATIENT_CLINIC_OR_DEPARTMENT_OTHER): Payer: Medicare Other

## 2014-01-18 DIAGNOSIS — N390 Urinary tract infection, site not specified: Secondary | ICD-10-CM

## 2014-01-18 DIAGNOSIS — C671 Malignant neoplasm of dome of bladder: Secondary | ICD-10-CM | POA: Diagnosis not present

## 2014-01-18 DIAGNOSIS — C679 Malignant neoplasm of bladder, unspecified: Secondary | ICD-10-CM

## 2014-01-18 LAB — URINALYSIS, MICROSCOPIC - CHCC
Bilirubin (Urine): NEGATIVE
GLUCOSE UR CHCC: NEGATIVE mg/dL
KETONES: NEGATIVE mg/dL
Nitrite: POSITIVE
PH: 6 (ref 4.6–8.0)
Protein: 30 mg/dL
Specific Gravity, Urine: 1.01 (ref 1.003–1.035)
Urobilinogen, UR: 0.2 mg/dL (ref 0.2–1)

## 2014-01-18 MED ORDER — CIPROFLOXACIN HCL 500 MG PO TABS: 500.0000 mg | ORAL_TABLET | Freq: Two times a day (BID) | ORAL | Status: DC

## 2014-01-18 NOTE — Progress Notes (Signed)
Wife called reporting patient with temp of 104.0 last night and then this morning @ 101.6. Spouse states she gave him aspirin last night as well as this am. Patient with chills but denies other symptoms including pain, n/v, cough, urinary problems. Per MD, patient to come in and have UA and Culture today and prescription of Cipro 500 mg to be taken twice daily for 10 days.  Spouse confirms will be in for lab appt @ 1:30 today. Denies further questions, knows to call office with any other questions or concerns.

## 2014-01-22 LAB — URINE CULTURE

## 2014-03-10 ENCOUNTER — Encounter: Payer: Self-pay | Admitting: Gastroenterology

## 2014-03-16 ENCOUNTER — Encounter: Payer: Self-pay | Admitting: Gastroenterology

## 2014-04-22 ENCOUNTER — Ambulatory Visit (INDEPENDENT_AMBULATORY_CARE_PROVIDER_SITE_OTHER): Payer: Medicare Other | Admitting: Family Medicine

## 2014-04-22 ENCOUNTER — Other Ambulatory Visit: Payer: Self-pay

## 2014-04-22 DIAGNOSIS — Z23 Encounter for immunization: Secondary | ICD-10-CM | POA: Diagnosis not present

## 2014-05-10 ENCOUNTER — Other Ambulatory Visit: Payer: Medicare Other

## 2014-05-10 ENCOUNTER — Ambulatory Visit: Payer: Medicare Other | Admitting: Oncology

## 2014-05-24 DIAGNOSIS — R748 Abnormal levels of other serum enzymes: Secondary | ICD-10-CM | POA: Diagnosis not present

## 2014-05-24 DIAGNOSIS — C61 Malignant neoplasm of prostate: Secondary | ICD-10-CM | POA: Diagnosis not present

## 2014-05-24 DIAGNOSIS — C679 Malignant neoplasm of bladder, unspecified: Secondary | ICD-10-CM | POA: Diagnosis not present

## 2014-05-24 DIAGNOSIS — R918 Other nonspecific abnormal finding of lung field: Secondary | ICD-10-CM | POA: Diagnosis not present

## 2014-05-24 DIAGNOSIS — R944 Abnormal results of kidney function studies: Secondary | ICD-10-CM | POA: Diagnosis not present

## 2014-05-24 DIAGNOSIS — N529 Male erectile dysfunction, unspecified: Secondary | ICD-10-CM | POA: Diagnosis not present

## 2014-05-24 DIAGNOSIS — N1330 Unspecified hydronephrosis: Secondary | ICD-10-CM | POA: Diagnosis not present

## 2014-05-24 DIAGNOSIS — J8410 Pulmonary fibrosis, unspecified: Secondary | ICD-10-CM | POA: Diagnosis not present

## 2014-05-24 DIAGNOSIS — N134 Hydroureter: Secondary | ICD-10-CM | POA: Diagnosis not present

## 2014-05-31 ENCOUNTER — Ambulatory Visit: Payer: Medicare Other | Admitting: Oncology

## 2014-05-31 ENCOUNTER — Other Ambulatory Visit: Payer: Medicare Other

## 2014-06-09 DIAGNOSIS — R748 Abnormal levels of other serum enzymes: Secondary | ICD-10-CM | POA: Diagnosis not present

## 2014-06-09 DIAGNOSIS — N139 Obstructive and reflux uropathy, unspecified: Secondary | ICD-10-CM | POA: Diagnosis not present

## 2014-06-09 DIAGNOSIS — C679 Malignant neoplasm of bladder, unspecified: Secondary | ICD-10-CM | POA: Diagnosis not present

## 2014-06-09 DIAGNOSIS — N1330 Unspecified hydronephrosis: Secondary | ICD-10-CM | POA: Diagnosis not present

## 2014-06-10 ENCOUNTER — Telehealth: Payer: Self-pay | Admitting: Oncology

## 2014-06-10 ENCOUNTER — Other Ambulatory Visit: Payer: Self-pay | Admitting: *Deleted

## 2014-06-10 NOTE — Telephone Encounter (Signed)
S/w pt confirming labs/ov r/s from 11/24 missed apt per 12/04 POF..... KJ

## 2014-06-16 ENCOUNTER — Telehealth: Payer: Self-pay | Admitting: Medical Oncology

## 2014-06-16 ENCOUNTER — Ambulatory Visit (INDEPENDENT_AMBULATORY_CARE_PROVIDER_SITE_OTHER): Payer: Medicare Other | Admitting: Physician Assistant

## 2014-06-16 ENCOUNTER — Other Ambulatory Visit: Payer: Self-pay | Admitting: Family Medicine

## 2014-06-16 ENCOUNTER — Encounter: Payer: Self-pay | Admitting: Physician Assistant

## 2014-06-16 VITALS — BP 118/70 | HR 72 | Temp 98.3°F | Resp 18 | Wt 212.0 lb

## 2014-06-16 DIAGNOSIS — M1000 Idiopathic gout, unspecified site: Secondary | ICD-10-CM

## 2014-06-16 DIAGNOSIS — C679 Malignant neoplasm of bladder, unspecified: Secondary | ICD-10-CM

## 2014-06-16 DIAGNOSIS — K219 Gastro-esophageal reflux disease without esophagitis: Secondary | ICD-10-CM | POA: Diagnosis not present

## 2014-06-16 DIAGNOSIS — G2581 Restless legs syndrome: Secondary | ICD-10-CM

## 2014-06-16 DIAGNOSIS — F329 Major depressive disorder, single episode, unspecified: Secondary | ICD-10-CM

## 2014-06-16 DIAGNOSIS — E785 Hyperlipidemia, unspecified: Secondary | ICD-10-CM

## 2014-06-16 DIAGNOSIS — R911 Solitary pulmonary nodule: Secondary | ICD-10-CM | POA: Diagnosis not present

## 2014-06-16 DIAGNOSIS — F32A Depression, unspecified: Secondary | ICD-10-CM

## 2014-06-16 DIAGNOSIS — M109 Gout, unspecified: Secondary | ICD-10-CM | POA: Diagnosis not present

## 2014-06-16 LAB — COMPLETE METABOLIC PANEL WITH GFR
ALBUMIN: 4 g/dL (ref 3.5–5.2)
ALT: 18 U/L (ref 0–53)
AST: 19 U/L (ref 0–37)
Alkaline Phosphatase: 138 U/L — ABNORMAL HIGH (ref 39–117)
BUN: 24 mg/dL — AB (ref 6–23)
CALCIUM: 8.8 mg/dL (ref 8.4–10.5)
CHLORIDE: 110 meq/L (ref 96–112)
CO2: 22 meq/L (ref 19–32)
CREATININE: 2.17 mg/dL — AB (ref 0.50–1.35)
GFR, Est African American: 35 mL/min — ABNORMAL LOW
GFR, Est Non African American: 30 mL/min — ABNORMAL LOW
Glucose, Bld: 140 mg/dL — ABNORMAL HIGH (ref 70–99)
Potassium: 4.7 mEq/L (ref 3.5–5.3)
Sodium: 141 mEq/L (ref 135–145)
Total Bilirubin: 0.4 mg/dL (ref 0.2–1.2)
Total Protein: 7.2 g/dL (ref 6.0–8.3)

## 2014-06-16 LAB — URIC ACID: Uric Acid, Serum: 10.2 mg/dL — ABNORMAL HIGH (ref 4.0–7.8)

## 2014-06-16 LAB — LIPID PANEL
CHOLESTEROL: 153 mg/dL (ref 0–200)
HDL: 35 mg/dL — ABNORMAL LOW (ref >39)
LDL CALC: 76 mg/dL (ref 0–99)
Total CHOL/HDL Ratio: 4.4 Ratio
Triglycerides: 212 mg/dL — ABNORMAL HIGH (ref <150)
VLDL: 42 mg/dL — ABNORMAL HIGH (ref 0–40)

## 2014-06-16 MED ORDER — OMEPRAZOLE 20 MG PO CPDR: 20.0000 mg | DELAYED_RELEASE_CAPSULE | Freq: Every day | ORAL | Status: DC

## 2014-06-16 MED ORDER — EZETIMIBE 10 MG PO TABS: 10.0000 mg | ORAL_TABLET | Freq: Every morning | ORAL | Status: DC

## 2014-06-16 MED ORDER — ESCITALOPRAM OXALATE 20 MG PO TABS: 20.0000 mg | ORAL_TABLET | Freq: Every day | ORAL | Status: DC

## 2014-06-16 MED ORDER — FLUTICASONE PROPIONATE 50 MCG/ACT NA SUSP: 2.0000 | Freq: Every day | NASAL | Status: DC

## 2014-06-16 MED ORDER — ROPINIROLE HCL 3 MG PO TABS: 3.0000 mg | ORAL_TABLET | Freq: Every day | ORAL | Status: DC

## 2014-06-16 NOTE — Telephone Encounter (Signed)
Patient LVMOM to inform office that he is scheduled for Surgery for his left kidney, on 12/22. 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 Alen Blew for the 12/23 d/t this surgery.   Mssg forwarded to MD.

## 2014-06-16 NOTE — Progress Notes (Signed)
Patient ID: [name] MRN: [id number], DOB: [birth year], 68 y.o. Date of Encounter: 06/16/2014, 1:14 PM    Chief Complaint:  Chief Complaint  Patient presents with  . med check/refills    not fasting     HPI: 68 y.o. year old male is here for routine f/u OV.   He has been going through treatment regarding bladder cancer. He says that it was 10/05/2013 he had surgery to remove his bladder and prostate. Says he did chemotherapy--for  9 treatments--prior to the surgery. Says that the last of the chemotherapy was January 2015. Says he was seeing a urologist here but that they stated that they did not do this type of surgery very frequently so had him go to [facility] for the surgery and they have been continuing to manage him. He says that they do follow-up scans.  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  He says that they plan to do surgery to remove the left kidney.  He now has a urostomy bag since his bladder has been removed.  Reviewed that my problem list also includes a lung nodule. I reviewed imaging and chart review but there are no type of imaging there. He says that he has all of his scans performed at [facility].  He has no specific complaints or concerns today. He is taking all of his medications as prescribed with no adverse effects.    Home Meds: Outpatient Prescriptions Prior to Visit  Medication Sig Dispense Refill  . aspirin 81 MG tablet Take 81 mg by mouth daily.    . colchicine 0.6 MG tablet At sign of gout flare, take 2 then one hour later take 1.  Then take 1 twice a day. 60 tablet 5  . eszopiclone (LUNESTA) 1 MG TABS tablet Take 1 mg by mouth at bedtime as needed for sleep (sleep). Take immediately before bedtime    . ezetimibe (ZETIA) 10 MG tablet Take 10 mg by mouth every morning.    . fluticasone (FLONASE) 50 MCG/ACT nasal spray  Place 2 sprays into the nose daily.    [name] omeprazole (PRILOSEC) 20 MG capsule Take 20 mg by mouth daily.    . ondansetron (ZOFRAN) 8 MG tablet Take 1 tablet (8 mg total) by mouth every 8 (eight) hours as needed for nausea or vomiting. 20 tablet 0  . prochlorperazine (COMPAZINE) 10 MG tablet Take 1 tablet (10 mg total) by mouth every 6 (six) hours as needed for nausea or vomiting. 30 tablet 2  . rOPINIRole (REQUIP) 3 MG tablet Take 3 mg by mouth at bedtime.    . Omega-3 Fatty Acids (FISH OIL) 1200 MG CAPS Take 1,200 mg by mouth 2 (two) times daily.    [name] allopurinol (ZYLOPRIM) 300 MG tablet Take 1 tablet (300 mg total) by mouth daily. (Patient not taking: Reported on 06/16/2014) 90 tablet 2  . ciprofloxacin (CIPRO) 500 MG tablet Take 1 tablet (500 mg total) by mouth 2 (two) times daily. 20 tablet 0  . citalopram (CELEXA) 20 MG tablet Take 20 mg by mouth every morning.    . clonazePAM (KLONOPIN) 0.5 MG tablet Take 0.5 mg by mouth at bedtime.     No facility-administered medications prior to visit.     Allergies:  Allergies  Allergen Reactions  . Augmentin [Amoxicillin-Pot Clavulanate] Nausea And Vomiting  . Statins Other (  See Comments)    Achiness, stiffness per pt  . Zocor [Simvastatin] Other (See Comments)    myalgia      Review of Systems: See HPI for pertinent ROS. All other ROS negative.    Physical Exam: Blood pressure 118/70, pulse 72, temperature 98.3 F (36.8 C), temperature source Oral, resp. rate 18, weight 212 lb (96.163 kg)., Body mass index is 32.24 kg/(m^2). General: WNWD WM.  Appears in no acute distress. Neck: Supple. No thyromegaly. No lymphadenopathy. No carotid bruit. Lungs: Clear bilaterally to auscultation without wheezes, rales, or rhonchi. Breathing is unlabored. Heart: Regular rhythm. No murmurs, rubs, or gallops. Msk:  Strength and tone normal for age. Extremities/Skin: Warm and dry.  No edema.  Neuro: Alert and oriented X 3. Moves all extremities  spontaneously. Gait is normal. CNII-XII grossly in tact. Psych:  Responds to questions appropriately with a normal affect.     ASSESSMENT AND PLAN:  68 y.o. year old male with    1. Malignant neoplasm of urinary bladder, unspecified site  2. Idiopathic gout, unspecified chronicity, unspecified site - Uric acid  He has been on allopurinol and colchicine. However he is telling me that he has had increased creatinine and found to have nonfunctioning kidney. Check labs now. If has elevated creatinine will need to stop these medications.  3. Restless leg syndrome, controlled Stable/controlled with current medication of Requip.  4. Hyperlipidemia - COMPLETE METABOLIC PANEL WITH GFR - Lipid panel  On Zetia and fish oil. History of myalgias with statins.  5. Depression Stable/controlled with current dose of Celexa.  6. Lung nodule He is having scans at [facility].  7. Gastroesophageal reflux disease, esophagitis presence not specified Stable/controlled with omeprazole.  Signed, [address] [place], [place], [facility] 06/16/2014 1:14 PM

## 2014-06-20 ENCOUNTER — Telehealth: Payer: Self-pay | Admitting: Family Medicine

## 2014-06-20 DIAGNOSIS — Z79899 Other long term (current) drug therapy: Secondary | ICD-10-CM | POA: Diagnosis not present

## 2014-06-20 DIAGNOSIS — E785 Hyperlipidemia, unspecified: Secondary | ICD-10-CM | POA: Diagnosis not present

## 2014-06-20 DIAGNOSIS — Z01818 Encounter for other preprocedural examination: Secondary | ICD-10-CM | POA: Diagnosis not present

## 2014-06-20 DIAGNOSIS — K219 Gastro-esophageal reflux disease without esophagitis: Secondary | ICD-10-CM

## 2014-06-20 DIAGNOSIS — Z7982 Long term (current) use of aspirin: Secondary | ICD-10-CM | POA: Diagnosis not present

## 2014-06-20 DIAGNOSIS — N135 Crossing vessel and stricture of ureter without hydronephrosis: Secondary | ICD-10-CM | POA: Diagnosis not present

## 2014-06-20 DIAGNOSIS — N289 Disorder of kidney and ureter, unspecified: Secondary | ICD-10-CM | POA: Diagnosis not present

## 2014-06-20 DIAGNOSIS — Z8551 Personal history of malignant neoplasm of bladder: Secondary | ICD-10-CM | POA: Diagnosis not present

## 2014-06-20 DIAGNOSIS — G2581 Restless legs syndrome: Secondary | ICD-10-CM | POA: Diagnosis not present

## 2014-06-20 DIAGNOSIS — G4733 Obstructive sleep apnea (adult) (pediatric): Secondary | ICD-10-CM | POA: Diagnosis not present

## 2014-06-20 MED ORDER — OMEPRAZOLE 20 MG PO CPDR: 20.0000 mg | DELAYED_RELEASE_CAPSULE | Freq: Every day | ORAL | Status: DC

## 2014-06-20 NOTE — Telephone Encounter (Signed)
Medication refilled per protocol. 

## 2014-06-21 DIAGNOSIS — F332 Major depressive disorder, recurrent severe without psychotic features: Secondary | ICD-10-CM | POA: Diagnosis not present

## 2014-06-22 ENCOUNTER — Telehealth: Payer: Self-pay | Admitting: Family Medicine

## 2014-06-22 MED ORDER — FEBUXOSTAT 40 MG PO TABS: 40.0000 mg | ORAL_TABLET | Freq: Every day | ORAL | Status: DC

## 2014-06-22 NOTE — Telephone Encounter (Signed)
-----   Message from Orlena Sheldon, PA-C sent at 06/20/2014  8:00 AM EST ----- Because of his kidney numbers, colchicine has to be stopped.  Remove this from medication list. Tell patient that in the future if he has a gout flare to call us. His uric acid levels are very high for his gout. Need to start Uloric for this.   Rx: Uloric 40mg  1 po QD # 30 + 5.  Tell him to return for lab in 2 months to recheck Uric Acid Level--to recheck on this dose of Uloric--may need to increase dose of Uloric .  Cholesterol looks good on current medication. Continue current medication for this.

## 2014-06-22 NOTE — Telephone Encounter (Signed)
Spoke to wife about lab results.  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  Understands medications changes and need for follow up.  Rx to pharmacy.

## 2014-06-29 ENCOUNTER — Telehealth: Payer: Self-pay | Admitting: Oncology

## 2014-06-29 ENCOUNTER — Ambulatory Visit: Payer: Medicare Other | Admitting: Oncology

## 2014-06-29 ENCOUNTER — Other Ambulatory Visit: Payer: Medicare Other

## 2014-06-29 NOTE — Telephone Encounter (Signed)
Pt came in a r/s appt to 07/07/14. Pt confirm.

## 2014-07-07 ENCOUNTER — Ambulatory Visit (HOSPITAL_BASED_OUTPATIENT_CLINIC_OR_DEPARTMENT_OTHER): Payer: Medicare Other | Admitting: Oncology

## 2014-07-07 ENCOUNTER — Other Ambulatory Visit (HOSPITAL_BASED_OUTPATIENT_CLINIC_OR_DEPARTMENT_OTHER): Payer: Medicare Other

## 2014-07-07 ENCOUNTER — Telehealth: Payer: Self-pay | Admitting: Oncology

## 2014-07-07 VITALS — BP 121/49 | HR 64 | Temp 97.9°F | Resp 18 | Ht 68.0 in | Wt 212.1 lb

## 2014-07-07 DIAGNOSIS — N289 Disorder of kidney and ureter, unspecified: Secondary | ICD-10-CM | POA: Diagnosis not present

## 2014-07-07 DIAGNOSIS — N1330 Unspecified hydronephrosis: Secondary | ICD-10-CM | POA: Diagnosis not present

## 2014-07-07 DIAGNOSIS — C679 Malignant neoplasm of bladder, unspecified: Secondary | ICD-10-CM

## 2014-07-07 DIAGNOSIS — C670 Malignant neoplasm of trigone of bladder: Secondary | ICD-10-CM

## 2014-07-07 LAB — CBC WITH DIFFERENTIAL/PLATELET
BASO%: 0.6 % (ref 0.0–2.0)
Basophils Absolute: 0 10*3/uL (ref 0.0–0.1)
EOS ABS: 0.1 10*3/uL (ref 0.0–0.5)
EOS%: 2 % (ref 0.0–7.0)
HEMATOCRIT: 40.3 % (ref 38.4–49.9)
HEMOGLOBIN: 13.4 g/dL (ref 13.0–17.1)
LYMPH%: 27.1 % (ref 14.0–49.0)
MCH: 30.5 pg (ref 27.2–33.4)
MCHC: 33.3 g/dL (ref 32.0–36.0)
MCV: 91.8 fL (ref 79.3–98.0)
MONO#: 0.4 10*3/uL (ref 0.1–0.9)
MONO%: 7.9 % (ref 0.0–14.0)
NEUT%: 62.4 % (ref 39.0–75.0)
NEUTROS ABS: 3.4 10*3/uL (ref 1.5–6.5)
PLATELETS: 198 10*3/uL (ref 140–400)
RBC: 4.39 10*6/uL (ref 4.20–5.82)
RDW: 13.5 % (ref 11.0–14.6)
WBC: 5.4 10*3/uL (ref 4.0–10.3)
lymph#: 1.5 10*3/uL (ref 0.9–3.3)

## 2014-07-07 LAB — COMPREHENSIVE METABOLIC PANEL (CC13)
ALBUMIN: 3.7 g/dL (ref 3.5–5.0)
ALK PHOS: 143 U/L (ref 40–150)
ALT: 25 U/L (ref 0–55)
AST: 20 U/L (ref 5–34)
Anion Gap: 9 mEq/L (ref 3–11)
BUN: 29.8 mg/dL — ABNORMAL HIGH (ref 7.0–26.0)
CALCIUM: 8.9 mg/dL (ref 8.4–10.4)
CO2: 25 mEq/L (ref 22–29)
Chloride: 108 mEq/L (ref 98–109)
Creatinine: 2.2 mg/dL — ABNORMAL HIGH (ref 0.7–1.3)
EGFR: 30 mL/min/{1.73_m2} — AB (ref >90)
Glucose: 118 mg/dl (ref 70–140)
POTASSIUM: 4.7 meq/L (ref 3.5–5.1)
SODIUM: 141 meq/L (ref 136–145)
Total Bilirubin: 0.48 mg/dL (ref 0.20–1.20)
Total Protein: 7.3 g/dL (ref 6.4–8.3)

## 2014-07-07 NOTE — Telephone Encounter (Signed)
gv pt appt schedule for june 2016. no pof at time of check out. pt given 71mo f/u based on office not and lab order entered today for 01/05/15.

## 2014-07-07 NOTE — Progress Notes (Signed)
Hematology and Oncology Follow Up Visit  [name] [id number] April 27, 1946 68 y.o. 07/07/2014 10:20 AM [name],[name], MDPickard, [name], MD   Principle Diagnosis: 68 year old gentleman with muscle invasive bladder cancer diagnosed in October of 2014 presented with a T2 N0 disease.  Prior Therapy: He is status post TURBT done on 04/21/2013 and the pathology showed invasive high-grade papillary urothelial carcinoma. He is on neoadjuvant chemotherapy with cisplatinum and Gemzar started on 06/11/2013. He is status post 3 cycle of chemotherapy completed in January of 2015. He underwent a cystectomy on 10/05/2013 at [facility]. The pathology revealed no residual tumor with 12 lymph nodes sampled noninvolved with malignancy.  Current therapy:  Observation and surveillance.  Interim History: [name] presents today for a followup visit by himself. Since his last visit, he has been doing well. He did develop worsening renal function and found to have hydronephrosis. 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 He is planned to have a nephrectomy scheduled on 07/19/2014. He is reporting feeling relatively well and continues to be asymptomatic. He does not have any residual complications from chemotherapy. He does not report any peripheral neuropathy or change in his appetite. His last CT scan in November 2015 did not show any evidence of recurrent disease.  He has not reported any nausea or vomiting or abdominal pain. Has not reported any genitourinary complaints. He have resumed most activities of daily living without any hindrance or decline. His activity levels are improving slowly since his operation. He did not report any headaches or blurry vision or double vision. Does not report any syncope or fainting episodes. Present report any chest pain shortness of breath cough or hemoptysis. Central any nausea or vomiting or abdominal pain. At that point any  frequency urgency or hesitancy. Does not report any skeletal complaints. Is not reporting rashes or lesions. Rest of his review of systems unremarkable.  Medications: I have reviewed the patient's current medications.  Current Outpatient Prescriptions  Medication Sig Dispense Refill  . aspirin 81 MG tablet Take 81 mg by mouth daily.    . clonazePAM (KLONOPIN) 1 MG tablet Take 1 mg by mouth at bedtime.  1  . escitalopram (LEXAPRO) 20 MG tablet Take 1 tablet (20 mg total) by mouth daily. 90 tablet 3  . eszopiclone (LUNESTA) 1 MG TABS tablet Take 1 mg by mouth at bedtime as needed for sleep (sleep). Take immediately before bedtime    . ezetimibe (ZETIA) 10 MG tablet Take 1 tablet (10 mg total) by mouth every morning. 90 tablet 3  . febuxostat (ULORIC) 40 MG tablet Take 1 tablet (40 mg total) by mouth daily. 30 tablet 5  . fluticasone (FLONASE) 50 MCG/ACT nasal spray Place 2 sprays into both nostrils daily. 48 g 3  . Omega-3 Fatty Acids (FISH OIL) 1200 MG CAPS Take 1,200 mg by mouth 2 (two) times daily.    [name] omeprazole (PRILOSEC) 20 MG capsule Take 1 capsule (20 mg total) by mouth daily. 90 capsule 3  . ondansetron (ZOFRAN) 8 MG tablet Take 1 tablet (8 mg total) by mouth every 8 (eight) hours as needed for nausea or vomiting. 20 tablet 0  . prochlorperazine (COMPAZINE) 10 MG tablet Take 1 tablet (10 mg total) by mouth every 6 (six) hours as needed for nausea or vomiting. 30 tablet 2  . rOPINIRole (REQUIP) 3 MG tablet Take 1 tablet (3 mg total) by mouth at bedtime. 90 tablet 3  No current facility-administered medications for this visit.     Allergies:  Allergies  Allergen Reactions  . Augmentin [Amoxicillin-Pot Clavulanate] Nausea And Vomiting  . Statins Other (See Comments)    Achiness, stiffness per pt  . Zocor [Simvastatin] Other (See Comments)    myalgia    Past Medical History, Surgical history, Social history, and Family History were reviewed and updated.    Physical  Exam: Blood pressure 121/49, pulse 64, temperature 97.9 F (36.6 C), temperature source Oral, resp. rate 18, height 5\' 8"  (1.727 m), weight 212 lb 1.6 oz (96.208 kg), SpO2 100 %. ECOG: 0 General appearance: alert Head: Normocephalic, without obvious abnormality, atraumatic Neck: no adenopathy Lymph nodes: Cervical, supraclavicular, and axillary nodes normal. Heart:regular rate and rhythm, S1, S2 normal, no murmur, click, rub or gallop Lung:chest clear, no wheezing, rales, normal symmetric air entry Abdomen: soft, non-tender, without masses or organomegaly EXT:no erythema, induration, or nodules   Lab Results: Lab Results  Component Value Date   WBC 5.4 07/07/2014   HGB 13.4 07/07/2014   HCT 40.3 07/07/2014   MCV 91.8 07/07/2014   PLT 198 07/07/2014     Chemistry      Component Value Date/Time   NA 141 06/16/2014 1125   NA 139 12/28/2013 1004   K 4.7 06/16/2014 1125   K 4.0 12/28/2013 1004   CL 110 06/16/2014 1125   CO2 22 06/16/2014 1125   CO2 21* 12/28/2013 1004   BUN 24* 06/16/2014 1125   BUN 33.9* 12/28/2013 1004   CREATININE 2.17* 06/16/2014 1125   CREATININE 1.9* 12/28/2013 1004   CREATININE 1.46* 06/09/2013 1205      Component Value Date/Time   CALCIUM 8.8 06/16/2014 1125   CALCIUM 9.1 12/28/2013 1004   ALKPHOS 138* 06/16/2014 1125   ALKPHOS 185* 12/28/2013 1004   AST 19 06/16/2014 1125   AST 16 12/28/2013 1004   ALT 18 06/16/2014 1125   ALT 12 12/28/2013 1004   BILITOT 0.4 06/16/2014 1125   BILITOT 0.32 12/28/2013 1004        Impression and Plan:  68 year old with the following issues:  1. T2 N0 muscle invasive bladder cancer diagnosed in October of 2014 after he presented with hematuria. He underwent a radical cystectomy on 10/05/2013 without any complications. His pathology did not reveal any residual disease at this time I see no indication for any further chemotherapy or adjuvant radiation therapy. Plan to continue active surveillance. He will have  a CT scan done at [facility] in November of 2015 did not show any evidence of recurrent disease. Plan is continued active surveillance and repeat visit in 6 months.  2. Renal insufficiency: Last CT scan showed hydronephrosis bilaterally with a nonfunctioning left kidney. He is scheduled to have a nephrectomy on 07/19/2014. He continues to follow-up regarding these issues at [facility].  3. IV access: His port has been removed due to possible infection. No further flushes will be required.  4. Followup: In 6 months for a clinical visit.    [id number],KMMNO 12/31/201510:20 AM

## 2014-07-08 DIAGNOSIS — Z905 Acquired absence of kidney: Secondary | ICD-10-CM

## 2014-07-08 HISTORY — DX: Acquired absence of kidney: Z90.5

## 2014-07-19 DIAGNOSIS — F329 Major depressive disorder, single episode, unspecified: Secondary | ICD-10-CM | POA: Diagnosis present

## 2014-07-19 DIAGNOSIS — Z888 Allergy status to other drugs, medicaments and biological substances status: Secondary | ICD-10-CM | POA: Diagnosis not present

## 2014-07-19 DIAGNOSIS — N1330 Unspecified hydronephrosis: Secondary | ICD-10-CM | POA: Diagnosis not present

## 2014-07-19 DIAGNOSIS — E785 Hyperlipidemia, unspecified: Secondary | ICD-10-CM | POA: Diagnosis present

## 2014-07-19 DIAGNOSIS — N9989 Other postprocedural complications and disorders of genitourinary system: Secondary | ICD-10-CM | POA: Diagnosis present

## 2014-07-19 DIAGNOSIS — G4733 Obstructive sleep apnea (adult) (pediatric): Secondary | ICD-10-CM | POA: Diagnosis present

## 2014-07-19 DIAGNOSIS — N131 Hydronephrosis with ureteral stricture, not elsewhere classified: Secondary | ICD-10-CM | POA: Diagnosis present

## 2014-07-19 DIAGNOSIS — M109 Gout, unspecified: Secondary | ICD-10-CM | POA: Diagnosis present

## 2014-07-19 DIAGNOSIS — G2581 Restless legs syndrome: Secondary | ICD-10-CM | POA: Diagnosis present

## 2014-07-19 DIAGNOSIS — N289 Disorder of kidney and ureter, unspecified: Secondary | ICD-10-CM | POA: Diagnosis not present

## 2014-07-19 DIAGNOSIS — Z8551 Personal history of malignant neoplasm of bladder: Secondary | ICD-10-CM | POA: Diagnosis not present

## 2014-07-19 DIAGNOSIS — Z87891 Personal history of nicotine dependence: Secondary | ICD-10-CM | POA: Diagnosis not present

## 2014-07-19 DIAGNOSIS — Z880 Allergy status to penicillin: Secondary | ICD-10-CM | POA: Diagnosis not present

## 2014-07-19 DIAGNOSIS — K219 Gastro-esophageal reflux disease without esophagitis: Secondary | ICD-10-CM | POA: Diagnosis present

## 2014-07-19 DIAGNOSIS — Z79899 Other long term (current) drug therapy: Secondary | ICD-10-CM | POA: Diagnosis not present

## 2014-07-19 DIAGNOSIS — N261 Atrophy of kidney (terminal): Secondary | ICD-10-CM | POA: Diagnosis not present

## 2014-07-19 DIAGNOSIS — N135 Crossing vessel and stricture of ureter without hydronephrosis: Secondary | ICD-10-CM | POA: Diagnosis not present

## 2014-07-22 ENCOUNTER — Encounter: Payer: Self-pay | Admitting: Family Medicine

## 2014-08-18 DIAGNOSIS — C679 Malignant neoplasm of bladder, unspecified: Secondary | ICD-10-CM | POA: Diagnosis not present

## 2014-08-18 IMAGING — US US RENAL
1 series · 14 of 25 positions shown · non-contrast
Comparison: None.

CLINICAL DATA: Bladder tumor

EXAM:
RENAL/URINARY TRACT ULTRASOUND COMPLETE

[Series 1: us renal · 0.22mm/px · 14 of 68 slices shown]
[im 1/68]
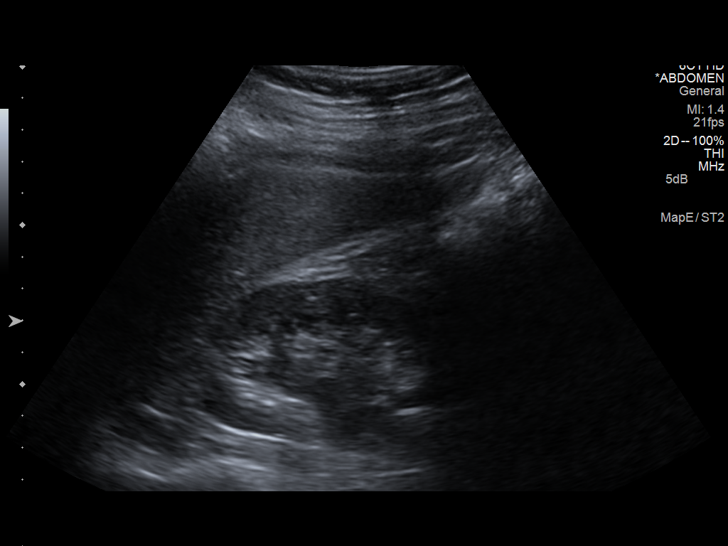
[im 6/68]
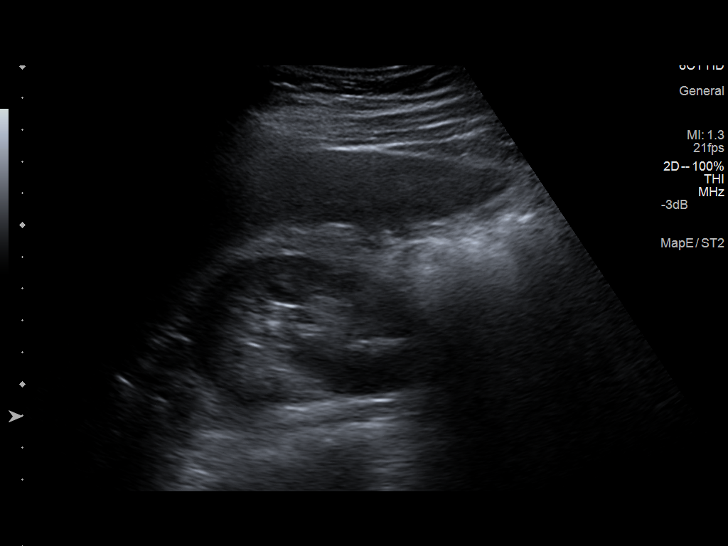
[im 12/68]
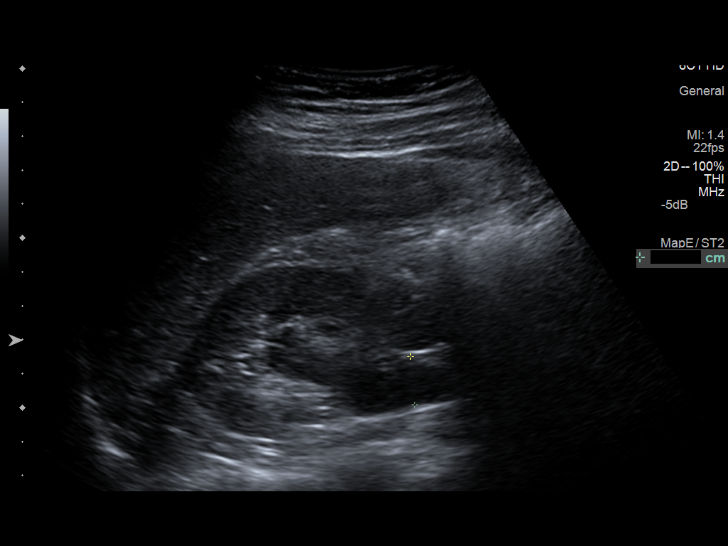
[im 17/68]
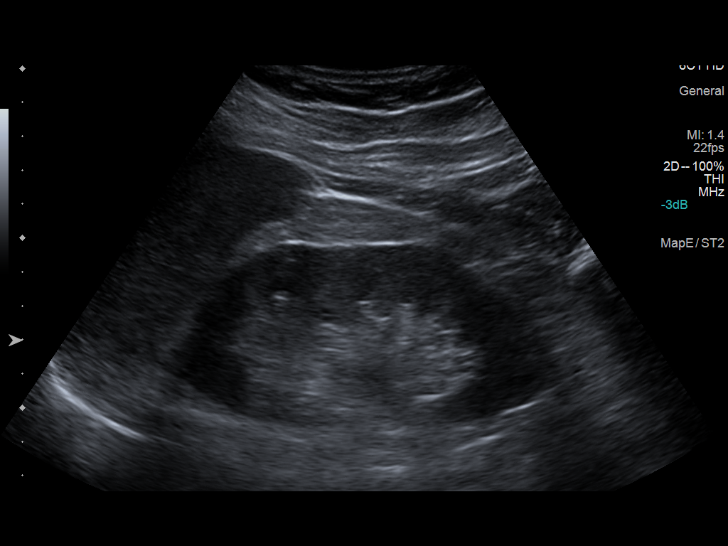
[im 23/68]
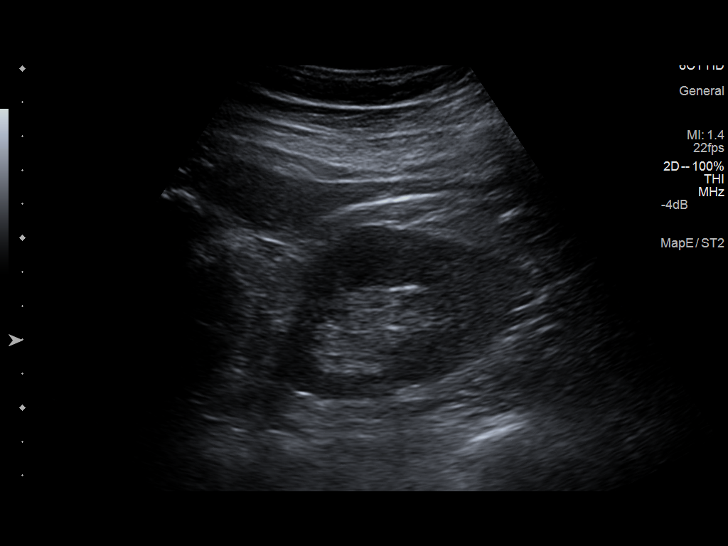
[im 26/68]
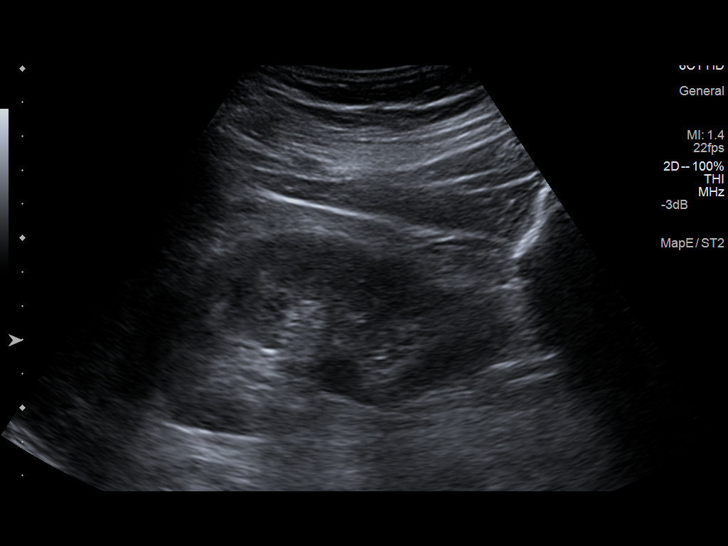
[im 31/68]
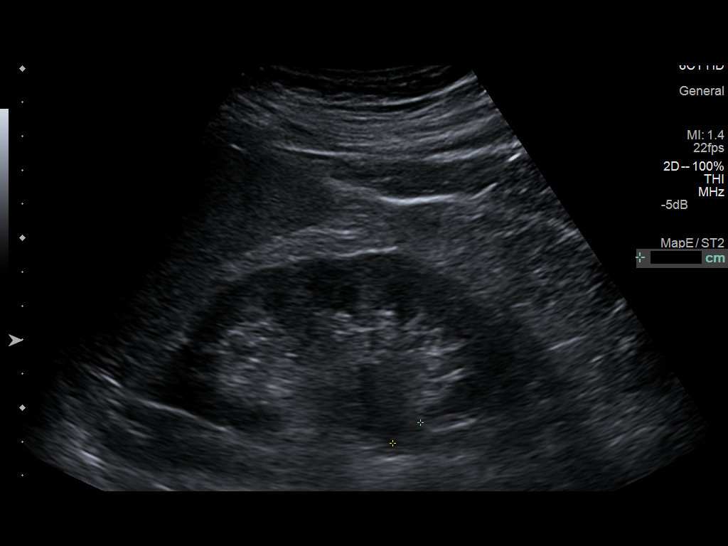
[im 37/68]
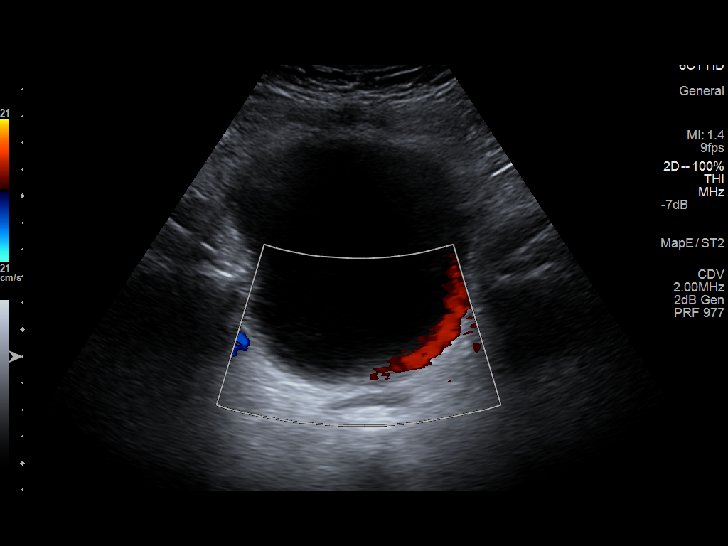
[im 42/68]
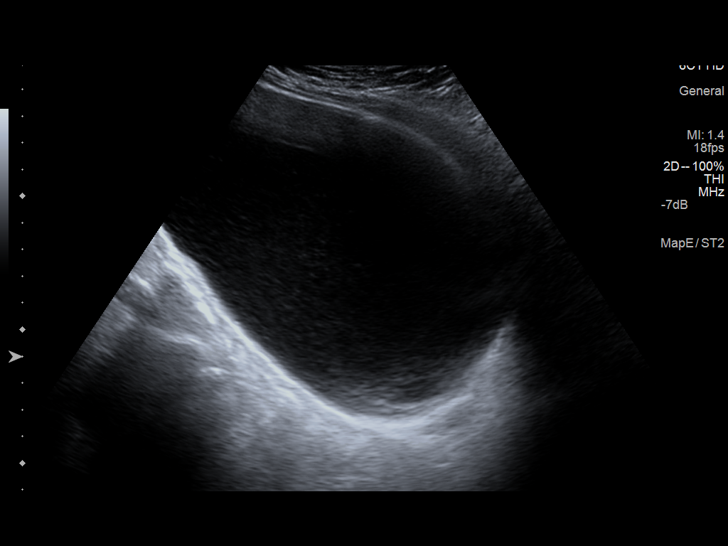
[im 45/68]
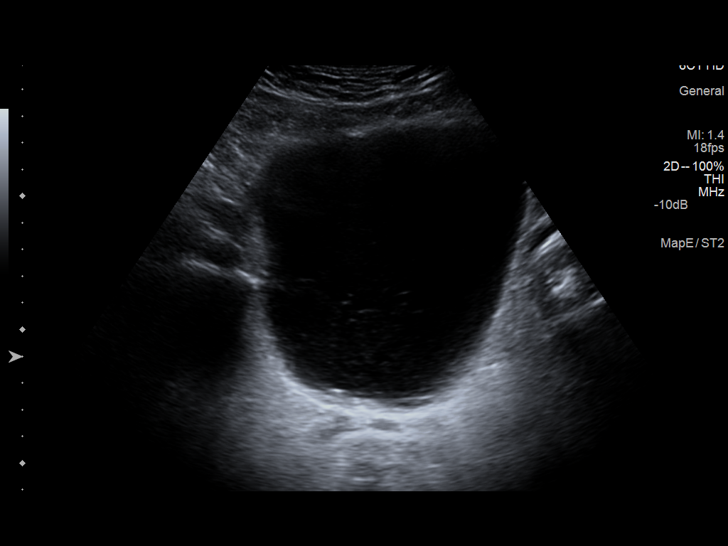
[im 51/68]
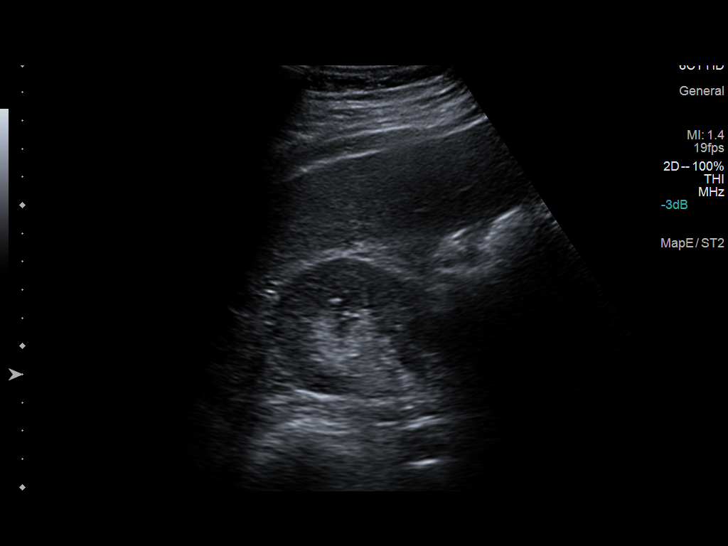
[im 56/68]
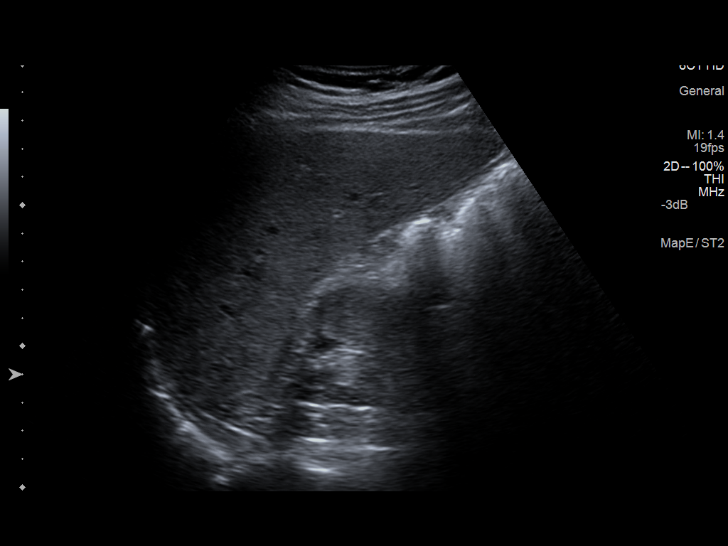
[im 62/68]
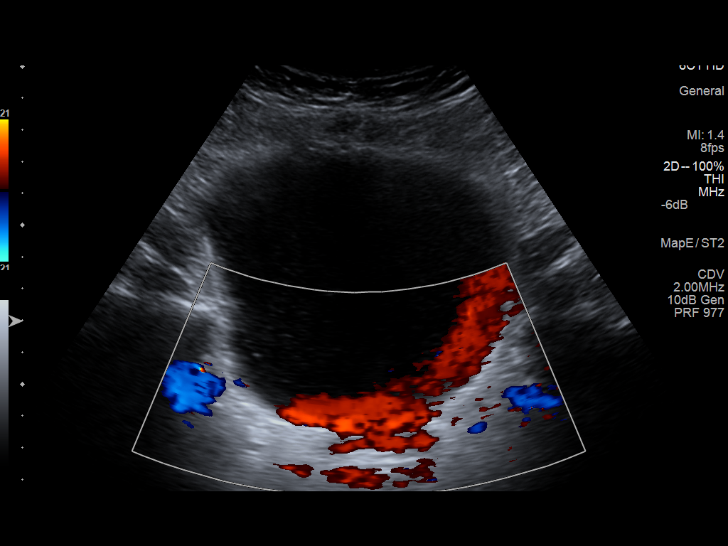
[im 68/68]
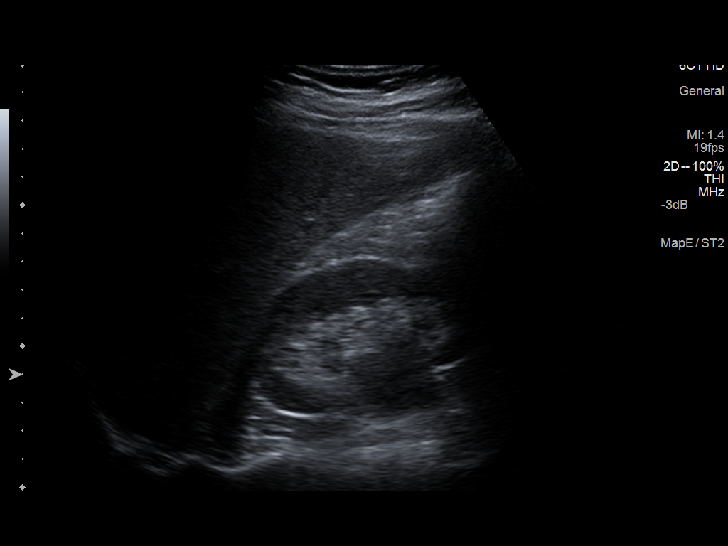

[14 of 25 positions shown; findings below may reference images not displayed]

FINDINGS: Right Kidney

Length: 10.5 cm. Mild to moderate hydronephrosis on the right
similar to the recent CT. Scarring of the a right upper pole renal
cortex as noted on the CT. Echogenicity within normal limits. .

Left Kidney

Length: 11.3 cm. Mild left hydronephrosis.. Echogenicity within
normal limits. .

Bladder

Distended bladder. Recent bladder biopsy. There may be some blood or
infection with echogenic debris within the urine. Left ureteral jet
is identified. Right ureteral jet not identified possibly due to
obstruction of the right ureter.
IMPRESSION: Mild to moderate right hydronephrosis similar to the recent CT scan.
Mild left hydronephrosis.

Right ureteral jet not identified.

## 2014-08-25 ENCOUNTER — Ambulatory Visit (INDEPENDENT_AMBULATORY_CARE_PROVIDER_SITE_OTHER): Payer: Medicare Other | Admitting: Physician Assistant

## 2014-08-25 ENCOUNTER — Encounter: Payer: Self-pay | Admitting: Physician Assistant

## 2014-08-25 VITALS — BP 104/60 | HR 72 | Temp 98.4°F | Resp 18 | Wt 200.0 lb

## 2014-08-25 DIAGNOSIS — R197 Diarrhea, unspecified: Secondary | ICD-10-CM | POA: Diagnosis not present

## 2014-08-25 LAB — COMPLETE METABOLIC PANEL WITH GFR
ALBUMIN: 4.1 g/dL (ref 3.5–5.2)
ALK PHOS: 137 U/L — AB (ref 39–117)
ALT: 43 U/L (ref 0–53)
AST: 39 U/L — ABNORMAL HIGH (ref 0–37)
BUN: 18 mg/dL (ref 6–23)
CALCIUM: 8.2 mg/dL — AB (ref 8.4–10.5)
CO2: 15 mEq/L — ABNORMAL LOW (ref 19–32)
Chloride: 111 mEq/L (ref 96–112)
Creat: 2.2 mg/dL — ABNORMAL HIGH (ref 0.50–1.35)
GFR, Est African American: 35 mL/min — ABNORMAL LOW
GFR, Est Non African American: 30 mL/min — ABNORMAL LOW
Glucose, Bld: 111 mg/dL — ABNORMAL HIGH (ref 70–99)
Potassium: 4.4 mEq/L (ref 3.5–5.3)
SODIUM: 139 meq/L (ref 135–145)
Total Bilirubin: 0.4 mg/dL (ref 0.2–1.2)
Total Protein: 7.2 g/dL (ref 6.0–8.3)

## 2014-08-25 LAB — CBC WITH DIFFERENTIAL/PLATELET
BASOS ABS: 0 10*3/uL (ref 0.0–0.1)
Basophils Relative: 0 % (ref 0–1)
EOS PCT: 4 % (ref 0–5)
Eosinophils Absolute: 0.3 10*3/uL (ref 0.0–0.7)
HCT: 41.8 % (ref 39.0–52.0)
HEMOGLOBIN: 13.8 g/dL (ref 13.0–17.0)
LYMPHS ABS: 1.5 10*3/uL (ref 0.7–4.0)
LYMPHS PCT: 22 % (ref 12–46)
MCH: 29.6 pg (ref 26.0–34.0)
MCHC: 33 g/dL (ref 30.0–36.0)
MCV: 89.5 fL (ref 78.0–100.0)
MONOS PCT: 7 % (ref 3–12)
MPV: 9.4 fL (ref 8.6–12.4)
Monocytes Absolute: 0.5 10*3/uL (ref 0.1–1.0)
NEUTROS ABS: 4.4 10*3/uL (ref 1.7–7.7)
Neutrophils Relative %: 67 % (ref 43–77)
Platelets: 232 10*3/uL (ref 150–400)
RBC: 4.67 MIL/uL (ref 4.22–5.81)
RDW: 14.1 % (ref 11.5–15.5)
WBC: 6.6 10*3/uL (ref 4.0–10.5)

## 2014-08-25 NOTE — Progress Notes (Signed)
Patient ID: [name] MRN: [id number], DOB: October 04, 1945, 69 y.o. Date of Encounter: 08/25/2014, 11:28 AM    Chief Complaint:  Chief Complaint  Patient presents with  . diarrhea x 2 days    no n/v, some abd discomfort     HPI: 69 y.o. year old white male presents with above complaint.  Says that this started on the night of Tuesday 08/23/14. Says that the number of episodes of diarrhea has decreased--- says he is now going a little longer between episodes.  However, still feels that what ever he drinks is coming right back out. However does not feel dehydrated. Does not feel lightheaded or really weak.  Says in the beginning he had some crampy abdominal pain across both sides of his abdomen. Otherwise has had no significant abdominal pain. No localized/focal pain.  Has had no vomiting.  His wife is not sick with diarrhea.  He had his kidney removed January 12. He has a urostomy bag. He is considered "cancer free". Is on no chemotherapy type medications and is on no new medications to be affecting this.     Home Meds:   Outpatient Prescriptions Prior to Visit  Medication Sig Dispense Refill  . aspirin 81 MG tablet Take 81 mg by mouth daily.    . clonazePAM (KLONOPIN) 1 MG tablet Take 1 mg by mouth at bedtime.  1  . escitalopram (LEXAPRO) 20 MG tablet Take 1 tablet (20 mg total) by mouth daily. 90 tablet 3  . eszopiclone (LUNESTA) 1 MG TABS tablet Take 1 mg by mouth at bedtime as needed for sleep (sleep). Take immediately before bedtime    . ezetimibe (ZETIA) 10 MG tablet Take 1 tablet (10 mg total) by mouth every morning. 90 tablet 3  . febuxostat (ULORIC) 40 MG tablet Take 1 tablet (40 mg total) by mouth daily. 30 tablet 5  . fluticasone (FLONASE) 50 MCG/ACT nasal spray Place 2 sprays into both nostrils daily. 48 g 3  . Omega-3 Fatty Acids (FISH OIL) 1200 MG CAPS Take 1,200 mg by mouth 2 (two) times daily.    [name] omeprazole (PRILOSEC) 20 MG capsule Take 1 capsule (20 mg  total) by mouth daily. 90 capsule 3  . rOPINIRole (REQUIP) 3 MG tablet Take 1 tablet (3 mg total) by mouth at bedtime. 90 tablet 3  . ondansetron (ZOFRAN) 8 MG tablet Take 1 tablet (8 mg total) by mouth every 8 (eight) hours as needed for nausea or vomiting. 20 tablet 0  . prochlorperazine (COMPAZINE) 10 MG tablet Take 1 tablet (10 mg total) by mouth every 6 (six) hours as needed for nausea or vomiting. 30 tablet 2   No facility-administered medications prior to visit.    Allergies:  Allergies  Allergen Reactions  . Augmentin [Amoxicillin-Pot Clavulanate] Nausea And Vomiting  . Statins Other (See Comments)    Achiness, stiffness per pt  . Zocor [Simvastatin] Other (See Comments)    myalgia      Review of Systems: See HPI for pertinent ROS. All other ROS negative.    Physical Exam: Blood pressure 104/60, pulse 72, temperature 98.4 F (36.9 C), temperature source Oral, resp. rate 18, weight 200 lb (90.719 kg)., Body mass index is 30.42 kg/(m^2). General: WNWD WM.  Appears in no acute distress. Neck: Supple. No thyromegaly. No lymphadenopathy. Lungs: Clear bilaterally to auscultation without wheezes, rales, or rhonchi. Breathing is unlabored. Heart: Regular rhythm. No murmurs, rubs, or gallops. Abdomen:  Urostomy bag in place. Soft, non-tender,  non-distended with normoactive bowel sounds. No hepatomegaly. No rebound/guarding. No obvious abdominal masses. No area of tenderness with palpation of abdomen. Msk:  Strength and tone normal for age. Extremities/Skin: Warm and dry. Neuro: Alert and oriented X 3. Moves all extremities spontaneously. Gait is normal. CNII-XII grossly in tact. Psych:  Responds to questions appropriately with a normal affect.     ASSESSMENT AND PLAN:  69 y.o. year old male with year old male with  1. Diarrhea - CBC with Differential/Platelet - COMPLETE METABOLIC PANEL WITH GFR  We'll hold off on obtaining stool cultures. If diarrhea persists towards 5 days then he will need  to return for these.  Recommend he add over-the-counter antidiarrhea medication such as Imodium  to decrease diarrhea/ decreased further fluid loss----especially given that he has had nephrectomy. Stick with ginger ale, chicken noodle soup. Follow-up if diarrhea worsens or is not resolving over the next 48 hours.  Otherwise we will also be following up with him once we get lab results.  Marin Olp Gonzalez, [place], [facility] 08/25/2014 11:28 AM

## 2014-10-11 ENCOUNTER — Encounter: Payer: Self-pay | Admitting: Gastroenterology

## 2014-10-14 ENCOUNTER — Telehealth: Payer: Self-pay | Admitting: Oncology

## 2014-10-14 NOTE — Telephone Encounter (Signed)
S/w pt confirming labs/ov r/s from 06/30 to 05/25 due to pt will be out of town most of June, pt confirmed and I will be sending msg to pt through my chart for pt to confirm he sees schedule on my chart.... KJ

## 2014-11-23 DIAGNOSIS — N99528 Other complication of other external stoma of urinary tract: Secondary | ICD-10-CM | POA: Diagnosis not present

## 2014-11-23 DIAGNOSIS — Z905 Acquired absence of kidney: Secondary | ICD-10-CM | POA: Diagnosis not present

## 2014-11-23 DIAGNOSIS — C679 Malignant neoplasm of bladder, unspecified: Secondary | ICD-10-CM | POA: Diagnosis not present

## 2014-11-23 DIAGNOSIS — N529 Male erectile dysfunction, unspecified: Secondary | ICD-10-CM | POA: Diagnosis not present

## 2014-11-23 DIAGNOSIS — R918 Other nonspecific abnormal finding of lung field: Secondary | ICD-10-CM | POA: Diagnosis not present

## 2014-11-23 DIAGNOSIS — R944 Abnormal results of kidney function studies: Secondary | ICD-10-CM | POA: Diagnosis not present

## 2014-12-02 ENCOUNTER — Ambulatory Visit (HOSPITAL_BASED_OUTPATIENT_CLINIC_OR_DEPARTMENT_OTHER): Payer: Medicare Other | Admitting: Oncology

## 2014-12-02 ENCOUNTER — Other Ambulatory Visit (HOSPITAL_BASED_OUTPATIENT_CLINIC_OR_DEPARTMENT_OTHER): Payer: Medicare Other

## 2014-12-02 ENCOUNTER — Telehealth: Payer: Self-pay | Admitting: Oncology

## 2014-12-02 VITALS — BP 128/66 | HR 71 | Temp 97.7°F | Resp 18 | Ht 68.0 in | Wt 213.6 lb

## 2014-12-02 DIAGNOSIS — N289 Disorder of kidney and ureter, unspecified: Secondary | ICD-10-CM | POA: Diagnosis not present

## 2014-12-02 DIAGNOSIS — C679 Malignant neoplasm of bladder, unspecified: Secondary | ICD-10-CM

## 2014-12-02 DIAGNOSIS — Z8551 Personal history of malignant neoplasm of bladder: Secondary | ICD-10-CM | POA: Diagnosis not present

## 2014-12-02 LAB — COMPREHENSIVE METABOLIC PANEL (CC13)
ALBUMIN: 3.3 g/dL — AB (ref 3.5–5.0)
ALT: 25 U/L (ref 0–55)
ANION GAP: 9 meq/L (ref 3–11)
AST: 21 U/L (ref 5–34)
Alkaline Phosphatase: 147 U/L (ref 40–150)
BUN: 25.7 mg/dL (ref 7.0–26.0)
CHLORIDE: 111 meq/L — AB (ref 98–109)
CO2: 21 mEq/L — ABNORMAL LOW (ref 22–29)
CREATININE: 2.4 mg/dL — AB (ref 0.7–1.3)
Calcium: 8.3 mg/dL — ABNORMAL LOW (ref 8.4–10.4)
EGFR: 27 mL/min/{1.73_m2} — ABNORMAL LOW (ref >90)
Glucose: 174 mg/dl — ABNORMAL HIGH (ref 70–140)
Potassium: 4.7 mEq/L (ref 3.5–5.1)
SODIUM: 141 meq/L (ref 136–145)
Total Bilirubin: 0.32 mg/dL (ref 0.20–1.20)
Total Protein: 6.7 g/dL (ref 6.4–8.3)

## 2014-12-02 LAB — CBC WITH DIFFERENTIAL/PLATELET
BASO%: 0.3 % (ref 0.0–2.0)
BASOS ABS: 0 10*3/uL (ref 0.0–0.1)
EOS ABS: 0.1 10*3/uL (ref 0.0–0.5)
EOS%: 2.2 % (ref 0.0–7.0)
HCT: 40.4 % (ref 38.4–49.9)
HEMOGLOBIN: 13.2 g/dL (ref 13.0–17.1)
LYMPH%: 22.6 % (ref 14.0–49.0)
MCH: 29.9 pg (ref 27.2–33.4)
MCHC: 32.7 g/dL (ref 32.0–36.0)
MCV: 91.4 fL (ref 79.3–98.0)
MONO#: 0.4 10*3/uL (ref 0.1–0.9)
MONO%: 6.3 % (ref 0.0–14.0)
NEUT#: 4.1 10*3/uL (ref 1.5–6.5)
NEUT%: 68.6 % (ref 39.0–75.0)
PLATELETS: 189 10*3/uL (ref 140–400)
RBC: 4.42 10*6/uL (ref 4.20–5.82)
RDW: 14 % (ref 11.0–14.6)
WBC: 6 10*3/uL (ref 4.0–10.3)
lymph#: 1.4 10*3/uL (ref 0.9–3.3)

## 2014-12-02 NOTE — Progress Notes (Signed)
Hematology and Oncology Follow Up Visit  [name] [id number] 01/31/1946 69 y.o. 12/02/2014 10:41 AM [name],[name], MDPickard, [name], MD   Principle Diagnosis: 69 year old gentleman with muscle invasive bladder cancer diagnosed in October of 2014 presented with a T2 N0 disease.  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 He is S/P neoadjuvant chemotherapy with cisplatinum and Gemzar started on 06/11/2013. He is status post 3 cycle of chemotherapy completed in January of 2015. He underwent a cystectomy on 10/05/2013 at [facility]. The pathology revealed no residual tumor with 12 lymph nodes sampled noninvolved with malignancy. 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 The pathology did not reveal any malignancy of his left kidney.  Current therapy:  Observation and surveillance.  Interim History: [name] presents today for a followup visit by himself. Since his last visit, he reports no complaints.  he tolerated his most recent surgery without any other complications. His creatinine have been stabilized around 2.2-2.3. He is reporting feeling relatively well and continues to be asymptomatic.  He does not report any peripheral neuropathy or change in his appetite.    He has not reported any nausea or vomiting or abdominal pain. Has not reported any genitourinary complaints. He have resumed most activities of daily living without any hindrance or decline. He did not report any headaches or blurry vision or double vision. Does not report any syncope or fainting episodes. Present report any chest pain shortness of breath cough or hemoptysis. Central any nausea or vomiting or abdominal pain. At that point any frequency urgency or hesitancy. Does not report any skeletal complaints. Is not reporting rashes or lesions. Rest of his review of systems  unremarkable.  Medications: I have reviewed the patient's current medications.  Current Outpatient Prescriptions  Medication Sig Dispense Refill  . aspirin 81 MG tablet Take 81 mg by mouth daily.    . clonazePAM (KLONOPIN) 1 MG tablet Take 1 mg by mouth at bedtime.  1  . docusate sodium (COLACE) 100 MG capsule Take 100 mg by mouth.    . escitalopram (LEXAPRO) 20 MG tablet Take 1 tablet (20 mg total) by mouth daily. 90 tablet 3  . eszopiclone (LUNESTA) 1 MG TABS tablet Take 1 mg by mouth at bedtime as needed for sleep (sleep). Take immediately before bedtime    . ezetimibe (ZETIA) 10 MG tablet Take 1 tablet (10 mg total) by mouth every morning. 90 tablet 3  . febuxostat (ULORIC) 40 MG tablet Take 1 tablet (40 mg total) by mouth daily. 30 tablet 5  . fluticasone (FLONASE) 50 MCG/ACT nasal spray Place 2 sprays into both nostrils daily. 48 g 3  . Omega-3 Fatty Acids (FISH OIL) 1200 MG CAPS Take 1,200 mg by mouth 2 (two) times daily.    [name] omeprazole (PRILOSEC) 20 MG capsule Take 1 capsule (20 mg total) by mouth daily. 90 capsule 3  . rOPINIRole (REQUIP) 3 MG tablet Take 1 tablet (3 mg total) by mouth at bedtime. 90 tablet 3   No current facility-administered medications for this visit.     Allergies:  Allergies  Allergen Reactions  . Augmentin [Amoxicillin-Pot Clavulanate] Nausea And Vomiting  . Statins Other (See Comments)    Achiness, stiffness per pt  . Zocor [Simvastatin] Other (See Comments)    myalgia    Past Medical History, Surgical history, Social history, and Family History were reviewed and updated.  Physical Exam: Blood pressure 128/66, pulse 71, temperature 97.7 F (36.5 C), temperature source Oral, resp. rate 18, height 5\' 8"  (1.727 m), weight 213 lb 9.6 oz (96.888 kg), SpO2 96 %. ECOG: 0 General appearance: alert away gentleman not in any distress.  Head: Normocephalic, without obvious abnormality, atraumatic Neck: no adenopathy Lymph nodes: Cervical,  supraclavicular, and axillary nodes normal. Heart:regular rate and rhythm, S1, S2 normal, no murmur, click, rub or gallop Lung:chest clear, no wheezing, rales, normal symmetric air entry Abdomen: soft, non-tender, without masses or organomegaly EXT:no erythema, induration, or nodules   Lab Results: Lab Results  Component Value Date   WBC 6.0 12/02/2014   HGB 13.2 12/02/2014   HCT 40.4 12/02/2014   MCV 91.4 12/02/2014   PLT 189 12/02/2014     Chemistry      Component Value Date/Time   NA 139 08/25/2014 1125   NA 141 07/07/2014 0957   K 4.4 08/25/2014 1125   K 4.7 07/07/2014 0957   CL 111 08/25/2014 1125   CO2 15* 08/25/2014 1125   CO2 25 07/07/2014 0957   BUN 18 08/25/2014 1125   BUN 29.8* 07/07/2014 0957   CREATININE 2.20* 08/25/2014 1125   CREATININE 2.2* 07/07/2014 0957   CREATININE 1.46* 06/09/2013 1205      Component Value Date/Time   CALCIUM 8.2* 08/25/2014 1125   CALCIUM 8.9 07/07/2014 0957   ALKPHOS 137* 08/25/2014 1125   ALKPHOS 143 07/07/2014 0957   AST 39* 08/25/2014 1125   AST 20 07/07/2014 0957   ALT 43 08/25/2014 1125   ALT 25 07/07/2014 0957   BILITOT 0.4 08/25/2014 1125   BILITOT 0.48 07/07/2014 0957      last CT scan done on 11/23/2014 at [facility] was reviewed today. He is status post left nephrectomy without any evidence of metastatic disease.    Impression and Plan:  69 year old with the following issues:  1. T2 N0 muscle invasive bladder cancer diagnosed in October of 2014 after he presented with hematuria. He underwent a radical cystectomy on 10/05/2013 without any complications. His pathology did not reveal any residual disease at this time I see no indication for any further chemotherapy or adjuvant radiation therapy.   CT scan in May 2016 did not show any evidence of recurrent disease. The plan is to continue active surveillance and repeat imaging studies in 6 months. The plan for this to be done  at [facility] but he is trying to  move it to be done locally.  2. Renal insufficiency: he is status post nephrectomy in January 2016 for recurrent hydronephrosis and pyelonephritis. Creatinine is currently pending but his baseline around 2.2.   3. IV access: His port has been removed due to possible infection. No further flushes will be required.  4. Followup: In 6 months for a clinical visit.    SHADAD,FIRAS 5/27/201610:41 AM

## 2014-12-02 NOTE — Telephone Encounter (Signed)
Pt confirmed labs/ov per 05/27 POF, gave pt AVS and Calendar.... KJ °

## 2014-12-08 DIAGNOSIS — F332 Major depressive disorder, recurrent severe without psychotic features: Secondary | ICD-10-CM | POA: Diagnosis not present

## 2015-01-05 ENCOUNTER — Ambulatory Visit: Payer: Medicare Other | Admitting: Oncology

## 2015-01-05 ENCOUNTER — Other Ambulatory Visit: Payer: Medicare Other

## 2015-03-20 DIAGNOSIS — F332 Major depressive disorder, recurrent severe without psychotic features: Secondary | ICD-10-CM | POA: Diagnosis not present

## 2015-04-05 ENCOUNTER — Ambulatory Visit (INDEPENDENT_AMBULATORY_CARE_PROVIDER_SITE_OTHER): Payer: Medicare Other | Admitting: Physician Assistant

## 2015-04-05 VITALS — BP 116/76 | HR 61 | Ht 68.5 in | Wt 208.7 lb

## 2015-04-05 DIAGNOSIS — R0609 Other forms of dyspnea: Secondary | ICD-10-CM

## 2015-04-05 NOTE — Progress Notes (Signed)
Cardiology Office Note   Date:  04/05/2015   ID:  [name], DOB 01-Oct-1945, MRN [id number]  PCP:  Austin Fraction, MD  Cardiologist:  [name], will be Austin Austin Barre, PA-C   Chief Complaint  Patient presents with  . Shortness of Breath    on exertion  . Fatigue  . Cough    History of Present Illness: [name] is a 69 y.o. male with a history of invasive bladder cancer, he has had a cystectomy and a left nephrectomy as well as an infected Port-A-Cath during treatment for this illness. He has been stable over the last several months.  [name] presents for evaluation of dyspnea on exertion  It has been over 6 months since his last procedure. In the last 6 months, he has worked in [employer] his strength and fitness back. However, he is trying to exercise and increase his activity but he has shortness of breath with exertion that has developed over the last few months and gotten progressively worse. If he climbs a flight of stairs, he feels that he is significantly short of breath at this time. This is [name] for him. He is not able to do any significant amount of yard work and if he is doing anything that requires having his arms up such as trimming bushes, he can only to 5 minutes or so. He has not had any chest pain. He denies orthopnea or PND. He has not had lower extremity edema. He is concerned about this and wonders if it might be an anginal equivalent.  Past Medical History  Diagnosis Date  . Bladder rupture 2010  . Allergy   . Depression   . Hyperlipidemia   . Restless leg syndrome, controlled   . Bladder tumor   . Gout   . History of unilateral nephrectomy 07/2014    [name]    Past Surgical History  Procedure Laterality Date  . Appendectomy    . Nasal septum surgery    . Tonsillectomy and adenoidectomy    . Fracture surgery Right 01/05/2005    lower leg/ankle  . Bladder surgery  2010  . Transurethral resection of bladder tumor with gyrus  (turbt-gyrus) N/A 04/21/2013    Procedure: TRANSURETHRAL RESECTION OF BLADDER TUMOR WITH GYRUS (TURBT-GYRUS), cystoscopy;  Surgeon: Austin Hazard, MD;  Location: [name];  Service: Urology;  Laterality: N/A;  . Cystoscopy w/ retrogrades Bilateral 04/21/2013    Procedure: CYSTOSCOPY WITH RETROGRADE PYELOGRAM;  Surgeon: Austin Hazard, MD;  Location: [name];  Service: Urology;  Laterality: Bilateral;    Current Outpatient Prescriptions  Medication Sig Dispense Refill  . aspirin 81 MG tablet Take 81 mg by mouth daily.    . clonazePAM (KLONOPIN) 0.5 MG tablet Take 0.5 mg by mouth 2 (two) times daily.  0  . colchicine 0.6 MG tablet Take 0.6 mg by mouth. 2 tablets on onset of flare up, then 1 tablet an hour later and then 1 tablet daily until gout is gone    . escitalopram (LEXAPRO) 20 MG tablet Take 1 tablet (20 mg total) by mouth daily. 90 tablet 3  . ezetimibe (ZETIA) 10 MG tablet Take 1 tablet (10 mg total) by mouth every morning. 90 tablet 3  . febuxostat (ULORIC) 40 MG tablet Take 1 tablet (40 mg total) by mouth daily. 30 tablet 5  . fluticasone (FLONASE) 50 MCG/ACT nasal spray Place 2 sprays into both nostrils daily. 48 g 3  .  omeprazole (PRILOSEC) 20 MG capsule Take 1 capsule (20 mg total) by mouth daily. 90 capsule 3  . rOPINIRole (REQUIP) 3 MG tablet Take 1 tablet (3 mg total) by mouth at bedtime. 90 tablet 3  . traZODone (DESYREL) 100 MG tablet Take 50 mg by mouth as needed for sleep.     No current facility-administered medications for this visit.    Allergies:   Augmentin; Statins; and Zocor    Social History:  The patient  reports that he quit smoking about 25 years ago. His smoking use included Cigarettes. He has a 15 pack-year smoking history. He has never used smokeless tobacco. He reports that he drinks alcohol. He reports that he does not use illicit drugs.   Family History:  The patient's family history includes Alcohol abuse in his father; Cancer in his mother;  Cirrhosis in his father.    ROS:  Please see the history of present illness. All other systems are reviewed and negative.    PHYSICAL EXAM: VS:  BP 116/76 mmHg  Pulse 61  Ht 5' 8.5" (1.74 m)  Wt 208 lb 11.2 oz (94.666 kg)  BMI 31.27 kg/m2 , BMI Body mass index is 31.27 kg/(m^2). GEN: Well nourished, well developed, male in no acute distress HEENT: normal Neck: no JVD, carotid bruits, or masses Cardiac: RRR; soft murmur, no rubs, or gallops,no edema  Respiratory:  clear to auscultation bilaterally, normal work of breathing GI: soft, nontender, nondistended, + BS MS: no deformity or atrophy; distal pulses are 2+ in all 4 extremities Skin: warm and dry, no rash Neuro:  Strength and sensation are intact Psych: euthymic mood, full affect   EKG:  EKG is ordered today. The ekg ordered today demonstrates sinus rhythm, no acute ischemic changes   Recent Labs: 12/02/2014: ALT 25; BUN 25.7; Creatinine 2.4*; HGB 13.2; Platelets 189; Potassium 4.7; Sodium 141    Lipid Panel    Component Value Date/Time   CHOL 153 06/16/2014 1125   TRIG 212* 06/16/2014 1125   HDL 35* 06/16/2014 1125   CHOLHDL 4.4 06/16/2014 1125   VLDL 42* 06/16/2014 1125   LDLCALC 76 06/16/2014 1125     Wt Readings from Last 3 Encounters:  04/05/15 208 lb 11.2 oz (94.666 kg)  12/02/14 213 lb 9.6 oz (96.888 kg)  08/25/14 200 lb (90.719 kg)     Other studies Reviewed: Additional studies/ records that were reviewed today include: Office Notes, [name] records and previous ECG.  ASSESSMENT AND PLAN:  1.  Dyspnea on exertion, possible anginal equivalent: Discussed methods of evaluation with [name]. He is currently taking aspirin and Zetia. His blood pressures well controlled and his heart rate is 61 so I will not add a beta blocker at this time. He refuses a prescription for nitroglycerin.  After discussing a stress echo versus a nuclear test, he prefers the stress echo. Because of his single kidney, we will  reserve any dye studies for abnormal noninvasive testing. This will be scheduled at its earliest convenience and he will follow-up with me or [name] after this.  Current medicines are reviewed at length with the patient today.  The patient does not have concerns regarding medicines.  The following changes have been made:  no change  Labs/ tests ordered today include:  Orders Placed This Encounter  Procedures  . ECHO STRESS TEST     Disposition:   FU with me or [name] in 2-3 weeks after the stress test   Signed, Barrett, Suanne Marker,  PA-C  04/05/2015 6:03 PM    [facility] [address], [place], [place]  [address] Phone: [phone]; Fax: [phone]

## 2015-04-05 NOTE — Patient Instructions (Signed)
Your physician recommends that you schedule a follow-up appointment in: 2-3 Weeks with Rosaria Ferries or Dr Oval Linsey  Your physician has requested that you have a stress echocardiogram. For further information please visit [email or web address]. Please follow instruction sheet as given.

## 2015-04-12 ENCOUNTER — Emergency Department (HOSPITAL_COMMUNITY): Payer: Medicare Other

## 2015-04-12 ENCOUNTER — Emergency Department (HOSPITAL_COMMUNITY)
Admission: EM | Admit: 2015-04-12 | Discharge: 2015-04-12 | Disposition: A | Discharge status: Home / Self Care | Payer: Medicare Other | Source: Home / Self Care | Attending: Emergency Medicine | Admitting: Emergency Medicine

## 2015-04-12 ENCOUNTER — Telehealth: Payer: Self-pay

## 2015-04-12 ENCOUNTER — Encounter (HOSPITAL_COMMUNITY): Payer: Self-pay

## 2015-04-12 DIAGNOSIS — R05 Cough: Secondary | ICD-10-CM | POA: Insufficient documentation

## 2015-04-12 DIAGNOSIS — R918 Other nonspecific abnormal finding of lung field: Secondary | ICD-10-CM | POA: Diagnosis not present

## 2015-04-12 DIAGNOSIS — M109 Gout, unspecified: Secondary | ICD-10-CM

## 2015-04-12 DIAGNOSIS — F329 Major depressive disorder, single episode, unspecified: Secondary | ICD-10-CM | POA: Insufficient documentation

## 2015-04-12 DIAGNOSIS — Z8669 Personal history of other diseases of the nervous system and sense organs: Secondary | ICD-10-CM

## 2015-04-12 DIAGNOSIS — R531 Weakness: Secondary | ICD-10-CM | POA: Diagnosis not present

## 2015-04-12 DIAGNOSIS — E871 Hypo-osmolality and hyponatremia: Secondary | ICD-10-CM | POA: Diagnosis not present

## 2015-04-12 DIAGNOSIS — R509 Fever, unspecified: Secondary | ICD-10-CM | POA: Diagnosis not present

## 2015-04-12 DIAGNOSIS — Z7951 Long term (current) use of inhaled steroids: Secondary | ICD-10-CM

## 2015-04-12 DIAGNOSIS — Z79899 Other long term (current) drug therapy: Secondary | ICD-10-CM

## 2015-04-12 DIAGNOSIS — Z7982 Long term (current) use of aspirin: Secondary | ICD-10-CM | POA: Insufficient documentation

## 2015-04-12 DIAGNOSIS — Z8639 Personal history of other endocrine, nutritional and metabolic disease: Secondary | ICD-10-CM

## 2015-04-12 DIAGNOSIS — Z87891 Personal history of nicotine dependence: Secondary | ICD-10-CM

## 2015-04-12 DIAGNOSIS — Z8551 Personal history of malignant neoplasm of bladder: Secondary | ICD-10-CM

## 2015-04-12 DIAGNOSIS — C679 Malignant neoplasm of bladder, unspecified: Secondary | ICD-10-CM | POA: Diagnosis not present

## 2015-04-12 DIAGNOSIS — D631 Anemia in chronic kidney disease: Secondary | ICD-10-CM | POA: Diagnosis not present

## 2015-04-12 DIAGNOSIS — N390 Urinary tract infection, site not specified: Secondary | ICD-10-CM

## 2015-04-12 DIAGNOSIS — A4151 Sepsis due to Escherichia coli [E. coli]: Secondary | ICD-10-CM | POA: Diagnosis not present

## 2015-04-12 DIAGNOSIS — N179 Acute kidney failure, unspecified: Secondary | ICD-10-CM | POA: Diagnosis not present

## 2015-04-12 HISTORY — DX: Malignant (primary) neoplasm, unspecified: C80.1

## 2015-04-12 LAB — COMPREHENSIVE METABOLIC PANEL
ALT: 25 U/L (ref 17–63)
AST: 26 U/L (ref 15–41)
Albumin: 3.9 g/dL (ref 3.5–5.0)
Alkaline Phosphatase: 119 U/L (ref 38–126)
Anion gap: 6 (ref 5–15)
BILIRUBIN TOTAL: 0.3 mg/dL (ref 0.3–1.2)
BUN: 32 mg/dL — AB (ref 6–20)
CO2: 22 mmol/L (ref 22–32)
CREATININE: 2.27 mg/dL — AB (ref 0.61–1.24)
Calcium: 8.4 mg/dL — ABNORMAL LOW (ref 8.9–10.3)
Chloride: 108 mmol/L (ref 101–111)
GFR, EST AFRICAN AMERICAN: 32 mL/min — AB (ref >60)
GFR, EST NON AFRICAN AMERICAN: 28 mL/min — AB (ref >60)
Glucose, Bld: 88 mg/dL (ref 65–99)
POTASSIUM: 3.8 mmol/L (ref 3.5–5.1)
Sodium: 136 mmol/L (ref 135–145)
TOTAL PROTEIN: 7.3 g/dL (ref 6.5–8.1)

## 2015-04-12 LAB — CBC
HCT: 37.6 % — ABNORMAL LOW (ref 39.0–52.0)
Hemoglobin: 12.6 g/dL — ABNORMAL LOW (ref 13.0–17.0)
MCH: 30.1 pg (ref 26.0–34.0)
MCHC: 33.5 g/dL (ref 30.0–36.0)
MCV: 89.7 fL (ref 78.0–100.0)
PLATELETS: 184 10*3/uL (ref 150–400)
RBC: 4.19 MIL/uL — ABNORMAL LOW (ref 4.22–5.81)
RDW: 13.3 % (ref 11.5–15.5)
WBC: 8.2 10*3/uL (ref 4.0–10.5)

## 2015-04-12 LAB — URINALYSIS, ROUTINE W REFLEX MICROSCOPIC
Bilirubin Urine: NEGATIVE
GLUCOSE, UA: NEGATIVE mg/dL
KETONES UR: NEGATIVE mg/dL
Nitrite: POSITIVE — AB
PROTEIN: 30 mg/dL — AB
Specific Gravity, Urine: 1.017 (ref 1.005–1.030)
UROBILINOGEN UA: 0.2 mg/dL (ref 0.0–1.0)
pH: 6 (ref 5.0–8.0)

## 2015-04-12 LAB — URINE MICROSCOPIC-ADD ON

## 2015-04-12 LAB — I-STAT CG4 LACTIC ACID, ED: LACTIC ACID, VENOUS: 1.26 mmol/L (ref 0.5–2.0)

## 2015-04-12 MED ORDER — CEPHALEXIN 500 MG PO CAPS: 500.0000 mg | ORAL_CAPSULE | Freq: Four times a day (QID) | ORAL | Status: DC

## 2015-04-12 MED ORDER — SODIUM CHLORIDE 0.9 % IV BOLUS (SEPSIS)
1000.0000 mL | Freq: Once | INTRAVENOUS | Status: AC
  Administered 2015-04-12: 1000 mL via INTRAVENOUS

## 2015-04-12 MED ORDER — IBUPROFEN 200 MG PO TABS: 400.0000 mg | ORAL_TABLET | Freq: Once | ORAL | Status: DC

## 2015-04-12 MED ORDER — ACETAMINOPHEN 325 MG PO TABS
650.0000 mg | ORAL_TABLET | Freq: Once | ORAL | Status: AC
  Administered 2015-04-12: 650 mg via ORAL
  Filled 2015-04-12: qty 2

## 2015-04-12 MED ORDER — DEXTROSE 5 % IV SOLN
1.0000 g | Freq: Once | INTRAVENOUS | Status: AC
  Administered 2015-04-12: 1 g via INTRAVENOUS
  Filled 2015-04-12: qty 10

## 2015-04-12 MED ORDER — ONDANSETRON 4 MG PO TBDP: ORAL_TABLET | ORAL | Status: DC

## 2015-04-12 NOTE — ED Notes (Signed)
Patient in radiology dept.   

## 2015-04-12 NOTE — ED Provider Notes (Signed)
CSN: [id number]     Arrival date & time 04/12/15  1629 History   First MD Initiated Contact with Patient 04/12/15 1655     Chief Complaint  Patient presents with  . Chills  . Fever     (Consider location/radiation/quality/duration/timing/severity/associated sxs/prior Treatment) Patient is a 69 y.o. male presenting with fever.  Fever Max temp prior to arrival:  101.6 Temp source:  Oral Severity:  Mild Onset quality:  Sudden Duration:  2 hours Timing:  Constant Chronicity:  New Relieved by: improved with tylenol. Associated symptoms: chills and cough     Past Medical History  Diagnosis Date  . Bladder rupture 2010  . Allergy   . Depression   . Hyperlipidemia   . Restless leg syndrome, controlled   . Bladder tumor   . Gout   . 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 Cancer [facility])     bladder   Past Surgical History  Procedure Laterality Date  . Appendectomy    . Nasal septum surgery    . Tonsillectomy and adenoidectomy    . Fracture surgery Right 01/05/2005    lower leg/ankle  . Bladder surgery  2010  . Transurethral resection of bladder tumor with gyrus (turbt-gyrus) N/A 04/21/2013    Procedure: TRANSURETHRAL RESECTION OF BLADDER TUMOR WITH GYRUS (TURBT-GYRUS), cystoscopy;  Surgeon: Molli Hazard, MD;  Location: [facility];  Service: Urology;  Laterality: N/A;  . Cystoscopy w/ retrogrades Bilateral 04/21/2013    Procedure: CYSTOSCOPY WITH RETROGRADE PYELOGRAM;  Surgeon: Molli Hazard, MD;  Location: [facility];  Service: Urology;  Laterality: Bilateral;   Family History  Problem Relation Age of Onset  . Cancer Mother     ovarian  . Cirrhosis Father   . Alcohol abuse Father    Social History  Substance Use Topics  . Smoking status: Former Smoker -- 0.50 packs/day for 30 years    Types: Cigarettes    Quit date: 07/24/1989  . Smokeless tobacco: Never Used  . Alcohol Use: Yes     Comment: social    Review of Systems  Constitutional:  Positive for fever and chills.  Respiratory: Positive for cough.   All other systems reviewed and are negative.     Allergies  Augmentin; Statins; and Zocor  Home Medications   Prior to Admission medications   Medication Sig Start Date End Date Taking? Authorizing Provider  acetaminophen (TYLENOL) 500 MG tablet Take 1,000 mg by mouth every 6 (six) hours as needed for moderate pain.   Yes Historical Provider, MD  aspirin 81 MG tablet Take 81 mg by mouth daily.   Yes Historical Provider, MD  clonazePAM (KLONOPIN) 0.5 MG tablet Take 0.5 mg by mouth 2 (two) times daily. 03/20/15  Yes Historical Provider, MD  colchicine 0.6 MG tablet Take 0.6 mg by mouth. 2 tablets on onset of flare up, then 1 tablet an hour later and then 1 tablet daily until gout is gone   Yes Historical Provider, MD  escitalopram (LEXAPRO) 20 MG tablet Take 1 tablet (20 mg total) by mouth daily. 06/16/14  Yes Orlena Sheldon, PA-C  ezetimibe (ZETIA) 10 MG tablet Take 1 tablet (10 mg total) by mouth every morning. Patient taking differently: Take 10 mg by mouth at bedtime.  06/16/14  Yes Mary B Dixon, PA-C  febuxostat (ULORIC) 40 MG tablet Take 1 tablet (40 mg total) by mouth daily. 06/22/14  Yes Mary B Dixon, PA-C  fluticasone (FLONASE) 50 MCG/ACT nasal spray  Place 2 sprays into both nostrils daily. 06/16/14  Yes Orlena Sheldon, PA-C  omeprazole (PRILOSEC) 20 MG capsule Take 1 capsule (20 mg total) by mouth daily. Patient taking differently: Take 20 mg by mouth at bedtime.  06/20/14  Yes Mary B Dixon, PA-C  rOPINIRole (REQUIP) 3 MG tablet Take 1 tablet (3 mg total) by mouth at bedtime. 06/16/14  Yes Orlena Sheldon, PA-C  traZODone (DESYREL) 100 MG tablet Take 50 mg by mouth daily as needed for sleep.    Yes Historical Provider, MD  cephALEXin (KEFLEX) 500 MG capsule Take 1 capsule (500 mg total) by mouth 4 (four) times daily. 04/12/15   Merrily Pew, MD  ondansetron (ZOFRAN ODT) 4 MG disintegrating tablet 4mg  ODT q4 hours prn  nausea/vomit 04/12/15   Merrily Pew, MD   BP 118/51 mmHg  Pulse 78  Temp(Src) 101.4 F (38.6 C) (Oral)  Resp 16  Ht 5' 8.5" (1.74 m)  Wt 199 lb (90.266 kg)  BMI 29.81 kg/m2  SpO2 98% Physical Exam  Constitutional: He is oriented to person, place, and time. He appears well-developed and well-nourished.  HENT:  Head: Normocephalic and atraumatic.  Eyes: Conjunctivae and EOM are normal.  Neck: Normal range of motion. Neck supple.  Cardiovascular: Normal rate and regular rhythm.   Pulmonary/Chest: Effort normal. No respiratory distress.  Abdominal: Soft. There is no tenderness.  Musculoskeletal: Normal range of motion. He exhibits no edema or tenderness.  Neurological: He is alert and oriented to person, place, and time.  Skin: Skin is warm and dry.  Nursing note and vitals reviewed.   ED Course  Procedures (including critical care time) Labs Review Labs Reviewed  COMPREHENSIVE METABOLIC PANEL - Abnormal; Notable for the following:    BUN 32 (*)    Creatinine, Ser 2.27 (*)    Calcium 8.4 (*)    GFR calc non Af Amer 28 (*)    GFR calc Af Amer 32 (*)    All other components within normal limits  URINALYSIS, ROUTINE W REFLEX MICROSCOPIC (NOT AT [facility]) - Abnormal; Notable for the following:    APPearance CLOUDY (*)    Hgb urine dipstick LARGE (*)    Protein, ur 30 (*)    Nitrite POSITIVE (*)    Leukocytes, UA LARGE (*)    All other components within normal limits  CBC - Abnormal; Notable for the following:    RBC 4.19 (*)    Hemoglobin 12.6 (*)    HCT 37.6 (*)    All other components within normal limits  URINE MICROSCOPIC-ADD ON - Abnormal; Notable for the following:    Bacteria, UA MANY (*)    All other components within normal limits  CULTURE, BLOOD (ROUTINE X 2)  CULTURE, BLOOD (ROUTINE X 2)  URINE CULTURE  I-STAT CG4 LACTIC ACID, ED  I-STAT CG4 LACTIC ACID, ED  I-STAT CG4 LACTIC ACID, ED  I-STAT CG4 LACTIC ACID, ED    Imaging Review Dg Chest 2  View  04/12/2015   CLINICAL DATA:  Chills and fever for 1 day. History of prostate and bladder carcinoma  EXAM: CHEST  2 VIEW  COMPARISON:  January 13, 2013  FINDINGS: There is a 6 mm nodular opacity in the lateral left base. Lungs elsewhere clear. Heart size and pulmonary vascularity are normal. No adenopathy. No bone lesions.  IMPRESSION: 6 mm nodular opacity lateral left base, not present previously. Advise noncontrast enhanced chest CT to further assess. No edema or consolidation.   Electronically Signed  By: Lowella Grip III M.D.   On: 04/12/2015 17:23   Ct Chest Wo Contrast  04/12/2015   CLINICAL DATA:  Abnormal chest x-ray  EXAM: CT CHEST WITHOUT CONTRAST  TECHNIQUE: Multidetector CT imaging of the chest was performed following the standard protocol without IV contrast.  COMPARISON:  CT abdomen 04/13/2013  FINDINGS: The radiographic abnormality corresponds to a stable 6 mm calcified granuloma in the left lower lobe. Tiny left lower lobe nodule on image 40 is stable. Calcified granuloma in the left lower lobe on image 35 is stable. 5 mm left lower lobe nodule on image 31 was not imaged on the prior study. 4 mm left upper lobe nodule on image 18. 5 mm right lower lobe pulmonary nodule on image 35 is stable. 5 mm right lower lobe pulmonary nodule on image 46 is stable.  Calcified left hilar nodes.  No abnormal mediastinal adenopathy.  No pneumothorax or pleural effusion.  No vertebral compression deformity. Prominent posterior disc osteophytes at C6-7.  IMPRESSION: The radiographic abnormality at the left base corresponds to a 6 mm calcified left lower lobe granuloma. Several other sub cm nodules are not significantly changed compared to a prior abdomen pelvis CT. There are tiny left upper nodules which were not visualized on the prior abdominal CT but are also probably benign.  No acute cardiopulmonary disease.   Electronically Signed   By: Marybelle Killings M.D.   On: 04/12/2015 21:39   I have personally  reviewed and evaluated these images and lab results as part of my medical decision-making.   EKG Interpretation None      MDM   Final diagnoses:  UTI (lower urinary tract infection)   Patient with rigors and fever prior to arrival. Rigors on my exam otherwise exam benign. Found to have urinary tract infection treated with Rocephin and some fluids. Patient's symptoms resolved when his fever improved. Had pulmonary nodule on chest x-rays and CT scan to evaluate appears to be calcified granuloma he will follow-up with his primary doctor for that. Patient's fever spiked again prior to leaving but no Reiger just felt malaise. Treated with Tylenol. Discussed admission versus outpatient treatment and patient elected to try outpatient treatment first. He'll follow with his doctor 3 days. Patient will go home on Keflex.   I have personally and contemperaneously reviewed labs and imaging and used in my decision making as above.   A medical screening exam was performed and I feel the patient has had an appropriate workup for their chief complaint at this time and likelihood of emergent condition existing is low. They have been counseled on decision, discharge, follow up and which symptoms necessitate immediate return to the emergency department. They or their family verbally stated understanding and agreement with plan and discharged in stable condition.    Merrily Pew, MD 04/12/15 [phone]

## 2015-04-12 NOTE — Telephone Encounter (Signed)
Pt called stating that he thinks he is experiencing symptoms of sepsis as he has had previously.   He is experiencing chills.  He will go to the ED if does not hear back from the cancer center in the next few minutes. Pt. did  arrive in the [facility] ED at 1629 per EMR.

## 2015-04-12 NOTE — ED Notes (Signed)
Pt c/o "feeling cold" and headache starting last night and chills/fever starting today.  Pain score 1/10.  Hx of bladder CA.  Pt does not receive treatment.  Pt reports taking Tylenol around 1500.

## 2015-04-12 NOTE — ED Notes (Addendum)
Pt stated "I came because I had fever and chills."  Pt placed on monitor.

## 2015-04-12 NOTE — ED Notes (Signed)
Patient transported to X-ray 

## 2015-04-13 ENCOUNTER — Telehealth (HOSPITAL_BASED_OUTPATIENT_CLINIC_OR_DEPARTMENT_OTHER): Payer: Self-pay | Admitting: Emergency Medicine

## 2015-04-13 ENCOUNTER — Inpatient Hospital Stay (HOSPITAL_COMMUNITY)
Admission: EM | Admit: 2015-04-13 | Discharge: 2015-04-15 | DRG: 872 | Disposition: A | Discharge status: Home / Self Care | Payer: Medicare Other | Attending: Internal Medicine | Admitting: Internal Medicine

## 2015-04-13 ENCOUNTER — Encounter (HOSPITAL_COMMUNITY): Payer: Self-pay | Admitting: Emergency Medicine

## 2015-04-13 DIAGNOSIS — M109 Gout, unspecified: Secondary | ICD-10-CM | POA: Diagnosis present

## 2015-04-13 DIAGNOSIS — F329 Major depressive disorder, single episode, unspecified: Secondary | ICD-10-CM | POA: Diagnosis present

## 2015-04-13 DIAGNOSIS — R531 Weakness: Secondary | ICD-10-CM | POA: Diagnosis not present

## 2015-04-13 DIAGNOSIS — Z880 Allergy status to penicillin: Secondary | ICD-10-CM

## 2015-04-13 DIAGNOSIS — E785 Hyperlipidemia, unspecified: Secondary | ICD-10-CM | POA: Diagnosis present

## 2015-04-13 DIAGNOSIS — G2581 Restless legs syndrome: Secondary | ICD-10-CM | POA: Diagnosis present

## 2015-04-13 DIAGNOSIS — B9689 Other specified bacterial agents as the cause of diseases classified elsewhere: Secondary | ICD-10-CM | POA: Diagnosis present

## 2015-04-13 DIAGNOSIS — Z79899 Other long term (current) drug therapy: Secondary | ICD-10-CM

## 2015-04-13 DIAGNOSIS — K219 Gastro-esophageal reflux disease without esophagitis: Secondary | ICD-10-CM | POA: Diagnosis present

## 2015-04-13 DIAGNOSIS — Z87891 Personal history of nicotine dependence: Secondary | ICD-10-CM | POA: Diagnosis not present

## 2015-04-13 DIAGNOSIS — Z8551 Personal history of malignant neoplasm of bladder: Secondary | ICD-10-CM

## 2015-04-13 DIAGNOSIS — R7881 Bacteremia: Secondary | ICD-10-CM | POA: Diagnosis not present

## 2015-04-13 DIAGNOSIS — D631 Anemia in chronic kidney disease: Secondary | ICD-10-CM | POA: Diagnosis present

## 2015-04-13 DIAGNOSIS — Z7982 Long term (current) use of aspirin: Secondary | ICD-10-CM | POA: Diagnosis not present

## 2015-04-13 DIAGNOSIS — Z809 Family history of malignant neoplasm, unspecified: Secondary | ICD-10-CM | POA: Diagnosis not present

## 2015-04-13 DIAGNOSIS — Z888 Allergy status to other drugs, medicaments and biological substances status: Secondary | ICD-10-CM

## 2015-04-13 DIAGNOSIS — A4150 Gram-negative sepsis, unspecified: Secondary | ICD-10-CM | POA: Diagnosis not present

## 2015-04-13 DIAGNOSIS — N189 Chronic kidney disease, unspecified: Secondary | ICD-10-CM

## 2015-04-13 DIAGNOSIS — C679 Malignant neoplasm of bladder, unspecified: Secondary | ICD-10-CM | POA: Diagnosis present

## 2015-04-13 DIAGNOSIS — N183 Chronic kidney disease, stage 3 unspecified: Secondary | ICD-10-CM | POA: Diagnosis present

## 2015-04-13 DIAGNOSIS — E871 Hypo-osmolality and hyponatremia: Secondary | ICD-10-CM | POA: Diagnosis present

## 2015-04-13 DIAGNOSIS — N179 Acute kidney failure, unspecified: Secondary | ICD-10-CM | POA: Diagnosis not present

## 2015-04-13 DIAGNOSIS — A4151 Sepsis due to Escherichia coli [E. coli]: Secondary | ICD-10-CM | POA: Diagnosis not present

## 2015-04-13 DIAGNOSIS — N390 Urinary tract infection, site not specified: Secondary | ICD-10-CM | POA: Diagnosis not present

## 2015-04-13 HISTORY — DX: Bacteremia: R78.81

## 2015-04-13 LAB — URINALYSIS, ROUTINE W REFLEX MICROSCOPIC
BILIRUBIN URINE: NEGATIVE
GLUCOSE, UA: NEGATIVE mg/dL
KETONES UR: NEGATIVE mg/dL
Nitrite: NEGATIVE
PROTEIN: NEGATIVE mg/dL
Specific Gravity, Urine: 1.008 (ref 1.005–1.030)
UROBILINOGEN UA: 0.2 mg/dL (ref 0.0–1.0)
pH: 6 (ref 5.0–8.0)

## 2015-04-13 LAB — CBC
HEMATOCRIT: 37.7 % — AB (ref 39.0–52.0)
Hemoglobin: 12.4 g/dL — ABNORMAL LOW (ref 13.0–17.0)
MCH: 29.7 pg (ref 26.0–34.0)
MCHC: 32.9 g/dL (ref 30.0–36.0)
MCV: 90.4 fL (ref 78.0–100.0)
PLATELETS: 156 10*3/uL (ref 150–400)
RBC: 4.17 MIL/uL — ABNORMAL LOW (ref 4.22–5.81)
RDW: 13.3 % (ref 11.5–15.5)
WBC: 9.8 10*3/uL (ref 4.0–10.5)

## 2015-04-13 LAB — BASIC METABOLIC PANEL
ANION GAP: 8 (ref 5–15)
BUN: 29 mg/dL — ABNORMAL HIGH (ref 6–20)
CALCIUM: 8.1 mg/dL — AB (ref 8.9–10.3)
CO2: 19 mmol/L — AB (ref 22–32)
CREATININE: 2.46 mg/dL — AB (ref 0.61–1.24)
Chloride: 105 mmol/L (ref 101–111)
GFR, EST AFRICAN AMERICAN: 29 mL/min — AB (ref >60)
GFR, EST NON AFRICAN AMERICAN: 25 mL/min — AB (ref >60)
Glucose, Bld: 170 mg/dL — ABNORMAL HIGH (ref 65–99)
Potassium: 3.8 mmol/L (ref 3.5–5.1)
SODIUM: 132 mmol/L — AB (ref 135–145)

## 2015-04-13 LAB — URINE MICROSCOPIC-ADD ON

## 2015-04-13 LAB — I-STAT CG4 LACTIC ACID, ED: LACTIC ACID, VENOUS: 1.38 mmol/L (ref 0.5–2.0)

## 2015-04-13 MED ORDER — ROPINIROLE HCL 1 MG PO TABS
3.0000 mg | ORAL_TABLET | Freq: Every day | ORAL | Status: DC
  Administered 2015-04-13 – 2015-04-15 (×2): 3 mg via ORAL
  Filled 2015-04-13 (×4): qty 3

## 2015-04-13 MED ORDER — ENOXAPARIN SODIUM 30 MG/0.3ML ~~LOC~~ SOLN
30.0000 mg | SUBCUTANEOUS | Status: DC
  Administered 2015-04-13 – 2015-04-14 (×2): 30 mg via SUBCUTANEOUS
  Filled 2015-04-13 (×3): qty 0.3

## 2015-04-13 MED ORDER — LORAZEPAM 2 MG/ML IJ SOLN
0.5000 mg | Freq: Four times a day (QID) | INTRAMUSCULAR | Status: DC | PRN
Start: 2015-04-13 — End: 2015-04-15

## 2015-04-13 MED ORDER — PANTOPRAZOLE SODIUM 40 MG PO TBEC
40.0000 mg | DELAYED_RELEASE_TABLET | Freq: Every day | ORAL | Status: DC
  Administered 2015-04-13 – 2015-04-15 (×3): 40 mg via ORAL
  Filled 2015-04-13 (×3): qty 1

## 2015-04-13 MED ORDER — HYDROCODONE-ACETAMINOPHEN 5-325 MG PO TABS: 1.0000 | ORAL_TABLET | ORAL | Status: DC | PRN

## 2015-04-13 MED ORDER — ESCITALOPRAM OXALATE 10 MG PO TABS
20.0000 mg | ORAL_TABLET | Freq: Every day | ORAL | Status: DC
  Administered 2015-04-14 – 2015-04-15 (×2): 20 mg via ORAL
  Filled 2015-04-13 (×2): qty 2

## 2015-04-13 MED ORDER — ONDANSETRON HCL 4 MG/2ML IJ SOLN: 4.0000 mg | Freq: Four times a day (QID) | INTRAMUSCULAR | Status: DC | PRN

## 2015-04-13 MED ORDER — HYDROMORPHONE HCL 1 MG/ML IJ SOLN: 0.5000 mg | INTRAMUSCULAR | Status: DC | PRN

## 2015-04-13 MED ORDER — FLUTICASONE PROPIONATE 50 MCG/ACT NA SUSP
2.0000 | Freq: Every day | NASAL | Status: DC
  Administered 2015-04-14 – 2015-04-15 (×2): 2 via NASAL
  Filled 2015-04-13: qty 16

## 2015-04-13 MED ORDER — SODIUM CHLORIDE 0.9 % IV SOLN
INTRAVENOUS | Status: DC
Start: 2015-04-13 — End: 2015-04-14
  Administered 2015-04-13 – 2015-04-14 (×2): via INTRAVENOUS

## 2015-04-13 MED ORDER — FEBUXOSTAT 40 MG PO TABS
40.0000 mg | ORAL_TABLET | Freq: Every day | ORAL | Status: DC
  Administered 2015-04-15: 40 mg via ORAL
  Filled 2015-04-13 (×3): qty 1

## 2015-04-13 MED ORDER — COLCHICINE 0.6 MG PO TABS
0.6000 mg | ORAL_TABLET | Freq: Every day | ORAL | Status: DC
  Administered 2015-04-13 – 2015-04-14 (×2): 0.6 mg via ORAL
  Filled 2015-04-13 (×4): qty 1

## 2015-04-13 MED ORDER — ASPIRIN EC 81 MG PO TBEC
81.0000 mg | DELAYED_RELEASE_TABLET | Freq: Every day | ORAL | Status: DC
  Administered 2015-04-14 – 2015-04-15 (×2): 81 mg via ORAL
  Filled 2015-04-13 (×2): qty 1

## 2015-04-13 MED ORDER — EZETIMIBE 10 MG PO TABS
10.0000 mg | ORAL_TABLET | Freq: Every morning | ORAL | Status: DC
  Administered 2015-04-14 – 2015-04-15 (×2): 10 mg via ORAL
  Filled 2015-04-13 (×2): qty 1

## 2015-04-13 MED ORDER — CLONAZEPAM 0.5 MG PO TABS
0.5000 mg | ORAL_TABLET | Freq: Two times a day (BID) | ORAL | Status: DC
  Administered 2015-04-13 – 2015-04-15 (×4): 0.5 mg via ORAL
  Filled 2015-04-13 (×4): qty 1

## 2015-04-13 MED ORDER — DEXTROSE 5 % IV SOLN
1.0000 g | Freq: Once | INTRAVENOUS | Status: AC
  Administered 2015-04-13: 1 g via INTRAVENOUS
  Filled 2015-04-13: qty 10

## 2015-04-13 MED ORDER — PIPERACILLIN-TAZOBACTAM 3.375 G IVPB
3.3750 g | Freq: Three times a day (TID) | INTRAVENOUS | Status: DC
  Administered 2015-04-13 – 2015-04-14 (×2): 3.375 g via INTRAVENOUS
  Filled 2015-04-13 (×3): qty 50

## 2015-04-13 MED ORDER — ACETAMINOPHEN 500 MG PO TABS
500.0000 mg | ORAL_TABLET | Freq: Four times a day (QID) | ORAL | Status: DC | PRN
  Administered 2015-04-13: 500 mg via ORAL
  Filled 2015-04-13: qty 1

## 2015-04-13 MED ORDER — TRAZODONE HCL 50 MG PO TABS: 50.0000 mg | ORAL_TABLET | Freq: Every day | ORAL | Status: DC | PRN

## 2015-04-13 MED ORDER — ONDANSETRON HCL 4 MG PO TABS: 4.0000 mg | ORAL_TABLET | Freq: Four times a day (QID) | ORAL | Status: DC | PRN

## 2015-04-13 NOTE — ED Notes (Signed)
Bed: WA21 Expected date:  Expected time:  Means of arrival:  Comments: Jasmin in waiting room

## 2015-04-13 NOTE — H&P (Signed)
[facility] History and Physical  [name] HKV:[id number] DOB: 07-Dec-1945 DOA: 04/13/2015  Referring physician: ED physician, Dr. Roderic Palau PCP: [name], MD   Chief Complaint: g- rods in blood cultures, called from ED to come back to the hospital   HPI:  Pt is 69 yo male with history of bladder cancer, presented to [facility] ED after he was called by doctor to come back after his blood cultures from 10/05 resulted back with gram negative rods. Pt presented 10/05 to [facility] ED with main concern of fevers and urinary urgency and dysuria and was discharged home with Cephalexin. Pt now back for reason outlined above and reports persistent fevers, chills at home with dysuria. Pt reports lower quadrant throbbing pain, intermittent in nature with 7/10 in severity when present, no specific alleviating or aggravating factors, non radiating.  In ED, pt noted to be hemodynamically stable, VS notable for Tmax 100.9F, HR up to 86, RR up to 24, blood cultures obtained and [facility] asked to admit for for further evaluation.   Assessment and Plan:  Principal Problem:   Sepsis due to Gram negative bacteria ([place]) - pt met criteria for sepsis with Tmax 100.6 F with RR up to 24 - source likely urinary from E. Coli - place on Zosyn and follow up on blood cultures from 10/05 and 10/06 - place on IVF and readjust the ABX regimen as clinically indicated   Active Problems:   UTI (lower urinary tract infection) - Zosyn for now as noted above and narrow down as indicated     Acute renal failure superimposed on stage 3 chronic kidney disease ([place]) - with baseline Cr 2 in the past year - will provide IVF and will repeat BMP in AM    Bacteremia due to Gram-negative bacteria (HCC) - zosyn should be adequate - repeat blood cultures and follow up on the cultures from 10/05    Anemia of chronic kidney failure - no sings of active bleeding - CBC in AM    Acute hyponatremia - appears to be pre renal  - IVF as  noted above and repeat BMP in AM    GERD (gastroesophageal reflux disease) - stable     Hyperlipidemia - continue statin     Bladder cancer ([place]) - outpatient follow up     DVT prophylaxis - Lovenox SQ  Radiological Exams on Admission: Dg Chest 2 View 04/12/2015   6 mm nodular opacity lateral left base, not present previously. Advise noncontrast enhanced chest CT to further assess. No edema or consolidation.     Ct Chest Wo Contrast 04/12/2015  The radiographic abnormality at the left base corresponds to a 6 mm calcified left lower lobe granuloma. Several other sub cm nodules are not significantly changed compared to a prior abdomen pelvis CT. There are tiny left upper nodules which were not visualized on the prior abdominal CT but are also probably benign.  No acute cardiopulmonary disease.    Code Status: Full Family Communication: Pt at bedside Disposition Plan: Admit for further evaluation    [facility] [phone]   Review of Systems:  Constitutional: Negative for diaphoresis.  HENT: Negative for hearing loss, ear pain, nosebleeds, congestion, sore throat, neck pain, tinnitus and ear discharge.   Eyes: Negative for blurred vision, double vision, photophobia, pain, discharge and redness.  Respiratory: Negative for cough, hemoptysis, sputum production, shortness of breath, wheezing and stridor.   Cardiovascular: Negative for chest pain, palpitations, orthopnea, claudication and leg swelling.  Gastrointestinal:  Negative for nausea, vomiting  Genitourinary: Negative for hematuria and flank pain.  Musculoskeletal: Negative for myalgias, back pain, joint pain and falls.  Skin: Negative for itching and rash.  Neurological: Negative for dizziness and weakness.  Endo/Heme/Allergies: Negative for environmental allergies and polydipsia. Does not bruise/bleed easily.  Psychiatric/Behavioral: Negative for suicidal ideas. The patient is not nervous/anxious.      Past Medical  History  Diagnosis Date  . Bladder rupture 2010  . Allergy   . Depression   . Hyperlipidemia   . Restless leg syndrome, controlled   . Bladder tumor   . Gout   . 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 Cancer [facility])     bladder    Past Surgical History  Procedure Laterality Date  . Appendectomy    . Nasal septum surgery    . Tonsillectomy and adenoidectomy    . Fracture surgery Right 01/05/2005    lower leg/ankle  . Bladder surgery  2010  . Transurethral resection of bladder tumor with gyrus (turbt-gyrus) N/A 04/21/2013    Procedure: TRANSURETHRAL RESECTION OF BLADDER TUMOR WITH GYRUS (TURBT-GYRUS), cystoscopy;  Surgeon: Molli Hazard, MD;  Location: [facility];  Service: Urology;  Laterality: N/A;  . Cystoscopy w/ retrogrades Bilateral 04/21/2013    Procedure: CYSTOSCOPY WITH RETROGRADE PYELOGRAM;  Surgeon: Molli Hazard, MD;  Location: [facility];  Service: Urology;  Laterality: Bilateral;    Social History:  reports that he quit smoking about 25 years ago. His smoking use included Cigarettes. He has a 15 pack-year smoking history. He has never used smokeless tobacco. He reports that he drinks alcohol. He reports that he does not use illicit drugs.  Allergies  Allergen Reactions  . Augmentin [Amoxicillin-Pot Clavulanate] Nausea And Vomiting  . Statins Other (See Comments)    Achiness, stiffness per pt  . Zocor [Simvastatin] Other (See Comments)    myalgia    Family History  Problem Relation Age of Onset  . Cancer Mother     ovarian  . Cirrhosis Father   . Alcohol abuse Father     Prior to Admission medications   Medication Sig Start Date End Date Taking? Authorizing Provider  acetaminophen (TYLENOL) 500 MG tablet Take 500 mg by mouth every 6 (six) hours as needed for moderate pain.    Yes Historical Provider, MD  aspirin 81 MG tablet Take 81 mg by mouth daily.   Yes Historical Provider, MD  cephALEXin (KEFLEX) 500 MG capsule Take 1 capsule (500  mg total) by mouth 4 (four) times daily. 04/12/15  Yes Merrily Pew, MD  clonazePAM (KLONOPIN) 0.5 MG tablet Take 0.5 mg by mouth 2 (two) times daily. 03/20/15  Yes Historical Provider, MD  colchicine 0.6 MG tablet Take 0.6 mg by mouth. 2 tablets on onset of flare up, then 1 tablet an hour later and then 1 tablet daily until gout is gone   Yes Historical Provider, MD  escitalopram (LEXAPRO) 20 MG tablet Take 1 tablet (20 mg total) by mouth daily. 06/16/14  Yes Orlena Sheldon, PA-C  ezetimibe (ZETIA) 10 MG tablet Take 1 tablet (10 mg total) by mouth every morning. Patient taking differently: Take 10 mg by mouth at bedtime.  06/16/14  Yes Mary B Dixon, PA-C  febuxostat (ULORIC) 40 MG tablet Take 1 tablet (40 mg total) by mouth daily. 06/22/14  Yes Mary B Dixon, PA-C  fluticasone (FLONASE) 50 MCG/ACT nasal spray Place 2 sprays into both nostrils daily. 06/16/14  Yes  Orlena Sheldon, PA-C  omeprazole (PRILOSEC) 20 MG capsule Take 1 capsule (20 mg total) by mouth daily. Patient taking differently: Take 20 mg by mouth at bedtime.  06/20/14  Yes Orlena Sheldon, PA-C  ondansetron (ZOFRAN ODT) 4 MG disintegrating tablet 37m ODT q4 hours prn nausea/vomit 04/12/15  Yes JMerrily Pew MD  rOPINIRole (REQUIP) 3 MG tablet Take 1 tablet (3 mg total) by mouth at bedtime. 06/16/14  Yes MOrlena Sheldon PA-C  traZODone (DESYREL) 100 MG tablet Take 50 mg by mouth daily as needed for sleep.    Yes Historical Provider, MD    Physical Exam: Filed Vitals:   04/13/15 1314 04/13/15 1316 04/13/15 1644  BP:  126/60 135/62  Pulse: 81  71  Temp: 99 F (37.2 C)  100.3 F (37.9 C)  TempSrc: Oral  Oral  Resp: 20  18  SpO2: 97%  100%    Physical Exam  Constitutional: Appears well-developed and well-nourished. No distress.  HENT: Normocephalic. External right and left ear normal. Dry MM  Eyes: Conjunctivae and EOM are normal. PERRLA, no scleral icterus.  Neck: Normal ROM. Neck supple. No JVD. No tracheal deviation. No thyromegaly.   CVS: RRR, S1/S2 +, no murmurs, no gallops, no carotid bruit.  Pulmonary: Effort and breath sounds normal, no stridor, rhonchi, wheezes, rales.  Abdominal: Soft. BS +,  no distension, tenderness in lower abd quadrants, no rebound or guarding.  Musculoskeletal: Normal range of motion. No edema and no tenderness.  Lymphadenopathy: No lymphadenopathy noted, cervical, inguinal. Neuro: Alert. Normal reflexes, muscle tone coordination. No cranial nerve deficit. Skin: Skin is warm and dry. No rash noted. Not diaphoretic. No erythema. No pallor.  Psychiatric: Normal mood and affect. Behavior, judgment, thought content normal.   Labs on Admission:  Basic Metabolic Panel:  Recent Labs Lab 04/12/15 1729 04/13/15 1355  NA 136 132*  K 3.8 3.8  CL 108 105  CO2 22 19*  GLUCOSE 88 170*  BUN 32* 29*  CREATININE 2.27* 2.46*  CALCIUM 8.4* 8.1*   Liver Function Tests:  Recent Labs Lab 04/12/15 1729  AST 26  ALT 25  ALKPHOS 119  BILITOT 0.3  PROT 7.3  ALBUMIN 3.9   CBC:  Recent Labs Lab 04/12/15 1729 04/13/15 1355  WBC 8.2 9.8  HGB 12.6* 12.4*  HCT 37.6* 37.7*  MCV 89.7 90.4  PLT 184 156    EKG: pending   If 7PM-7AM, please contact night-coverage [email or web address] Password [email or web address] 04/13/2015, 5:42 PM

## 2015-04-13 NOTE — ED Provider Notes (Signed)
CSN: [id number]     Arrival date & time 04/13/15  1309 History   First MD Initiated Contact with Patient 04/13/15 1550     Chief Complaint  Patient presents with  . Abnormal Lab  . Weakness     (Consider location/radiation/quality/duration/timing/severity/associated sxs/prior Treatment) Patient is a 69 y.o. male presenting with weakness. The history is provided by the patient (Patient was seen here in the emergency department yesterday with urinary tract infection. He was called back today because he had positive blood cultures that were random negative rods. Patient has a history of sepsis last year he states he still having fe).  Weakness This is a new problem. The current episode started 1 to 2 hours ago. The problem occurs hourly. The problem has not changed since onset.Pertinent negatives include no chest pain, no abdominal pain and no headaches. Nothing aggravates the symptoms. Nothing relieves the symptoms.    Past Medical History  Diagnosis Date  . Bladder rupture 2010  . Allergy   . Depression   . Hyperlipidemia   . Restless leg syndrome, controlled   . Bladder tumor   . Gout   . 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 Cancer [facility])     bladder   Past Surgical History  Procedure Laterality Date  . Appendectomy    . Nasal septum surgery    . Tonsillectomy and adenoidectomy    . Fracture surgery Right 01/05/2005    lower leg/ankle  . Bladder surgery  2010  . Transurethral resection of bladder tumor with gyrus (turbt-gyrus) N/A 04/21/2013    Procedure: TRANSURETHRAL RESECTION OF BLADDER TUMOR WITH GYRUS (TURBT-GYRUS), cystoscopy;  Surgeon: Molli Hazard, MD;  Location: [facility];  Service: Urology;  Laterality: N/A;  . Cystoscopy w/ retrogrades Bilateral 04/21/2013    Procedure: CYSTOSCOPY WITH RETROGRADE PYELOGRAM;  Surgeon: Molli Hazard, MD;  Location: [facility];  Service: Urology;  Laterality: Bilateral;   Family History  Problem Relation Age  of Onset  . Cancer Mother     ovarian  . Cirrhosis Father   . Alcohol abuse Father    Social History  Substance Use Topics  . Smoking status: Former Smoker -- 0.50 packs/day for 30 years    Types: Cigarettes    Quit date: 07/24/1989  . Smokeless tobacco: Never Used  . Alcohol Use: Yes     Comment: social    Review of Systems  Constitutional: Positive for fever. Negative for appetite change and fatigue.  HENT: Negative for congestion, ear discharge and sinus pressure.   Eyes: Negative for discharge.  Respiratory: Negative for cough.   Cardiovascular: Negative for chest pain.  Gastrointestinal: Negative for abdominal pain and diarrhea.  Genitourinary: Negative for frequency and hematuria.  Musculoskeletal: Negative for back pain.  Skin: Negative for rash.  Neurological: Positive for weakness. Negative for seizures and headaches.  Psychiatric/Behavioral: Negative for hallucinations.      Allergies  Augmentin; Statins; and Zocor  Home Medications   Prior to Admission medications   Medication Sig Start Date End Date Taking? Authorizing Provider  acetaminophen (TYLENOL) 500 MG tablet Take 500 mg by mouth every 6 (six) hours as needed for moderate pain.    Yes Historical Provider, MD  aspirin 81 MG tablet Take 81 mg by mouth daily.   Yes Historical Provider, MD  cephALEXin (KEFLEX) 500 MG capsule Take 1 capsule (500 mg total) by mouth 4 (four) times daily. 04/12/15  Yes Merrily Pew, MD  clonazePAM [place])  0.5 MG tablet Take 0.5 mg by mouth 2 (two) times daily. 03/20/15  Yes Historical Provider, MD  colchicine 0.6 MG tablet Take 0.6 mg by mouth. 2 tablets on onset of flare up, then 1 tablet an hour later and then 1 tablet daily until gout is gone   Yes Historical Provider, MD  escitalopram (LEXAPRO) 20 MG tablet Take 1 tablet (20 mg total) by mouth daily. 06/16/14  Yes Orlena Sheldon, PA-C  ezetimibe (ZETIA) 10 MG tablet Take 1 tablet (10 mg total) by mouth every  morning. Patient taking differently: Take 10 mg by mouth at bedtime.  06/16/14  Yes Mary B Dixon, PA-C  febuxostat (ULORIC) 40 MG tablet Take 1 tablet (40 mg total) by mouth daily. 06/22/14  Yes Mary B Dixon, PA-C  fluticasone (FLONASE) 50 MCG/ACT nasal spray Place 2 sprays into both nostrils daily. 06/16/14  Yes Orlena Sheldon, PA-C  omeprazole (PRILOSEC) 20 MG capsule Take 1 capsule (20 mg total) by mouth daily. Patient taking differently: Take 20 mg by mouth at bedtime.  06/20/14  Yes Orlena Sheldon, PA-C  ondansetron (ZOFRAN ODT) 4 MG disintegrating tablet 4mg  ODT q4 hours prn nausea/vomit 04/12/15  Yes Merrily Pew, MD  rOPINIRole (REQUIP) 3 MG tablet Take 1 tablet (3 mg total) by mouth at bedtime. 06/16/14  Yes Orlena Sheldon, PA-C  traZODone (DESYREL) 100 MG tablet Take 50 mg by mouth daily as needed for sleep.    Yes Historical Provider, MD   BP 126/60 mmHg  Pulse 81  Temp(Src) 99 F (37.2 C) (Oral)  Resp 20  SpO2 97% Physical Exam  Constitutional: He is oriented to person, place, and time. He appears well-developed.  HENT:  Head: Normocephalic.  Eyes: Conjunctivae and EOM are normal. No scleral icterus.  Neck: Neck supple. No thyromegaly present.  Cardiovascular: Normal rate and regular rhythm.  Exam reveals no gallop and no friction rub.   No murmur heard. Pulmonary/Chest: No stridor. He has no wheezes. He has no rales. He exhibits no tenderness.  Abdominal: He exhibits no distension. There is no tenderness. There is no rebound.  Musculoskeletal: Normal range of motion. He exhibits no edema.  Lymphadenopathy:    He has no cervical adenopathy.  Neurological: He is oriented to person, place, and time. He exhibits normal muscle tone. Coordination normal.  Skin: No rash noted. No erythema.  Psychiatric: He has a normal mood and affect. His behavior is normal.    ED Course  Procedures (including critical care time) Labs Review Labs Reviewed  CBC - Abnormal; Notable for the  following:    RBC 4.17 (*)    Hemoglobin 12.4 (*)    HCT 37.7 (*)    All other components within normal limits  BASIC METABOLIC PANEL - Abnormal; Notable for the following:    Sodium 132 (*)    CO2 19 (*)    Glucose, Bld 170 (*)    BUN 29 (*)    Creatinine, Ser 2.46 (*)    Calcium 8.1 (*)    GFR calc non Af Amer 25 (*)    GFR calc Af Amer 29 (*)    All other components within normal limits  I-STAT CG4 LACTIC ACID, ED  I-STAT CG4 LACTIC ACID, ED    Imaging Review Dg Chest 2 View  04/12/2015   CLINICAL DATA:  Chills and fever for 1 day. History of prostate and bladder carcinoma  EXAM: CHEST  2 VIEW  COMPARISON:  January 13, 2013  FINDINGS: There is a 6 mm nodular opacity in the lateral left base. Lungs elsewhere clear. Heart size and pulmonary vascularity are normal. No adenopathy. No bone lesions.  IMPRESSION: 6 mm nodular opacity lateral left base, not present previously. Advise noncontrast enhanced chest CT to further assess. No edema or consolidation.   Electronically Signed   By: Lowella Grip III M.D.   On: 04/12/2015 17:23   Ct Chest Wo Contrast  04/12/2015   CLINICAL DATA:  Abnormal chest x-ray  EXAM: CT CHEST WITHOUT CONTRAST  TECHNIQUE: Multidetector CT imaging of the chest was performed following the standard protocol without IV contrast.  COMPARISON:  CT abdomen 04/13/2013  FINDINGS: The radiographic abnormality corresponds to a stable 6 mm calcified granuloma in the left lower lobe. Tiny left lower lobe nodule on image 40 is stable. Calcified granuloma in the left lower lobe on image 35 is stable. 5 mm left lower lobe nodule on image 31 was not imaged on the prior study. 4 mm left upper lobe nodule on image 18. 5 mm right lower lobe pulmonary nodule on image 35 is stable. 5 mm right lower lobe pulmonary nodule on image 46 is stable.  Calcified left hilar nodes.  No abnormal mediastinal adenopathy.  No pneumothorax or pleural effusion.  No vertebral compression deformity. Prominent  posterior disc osteophytes at C6-7.  IMPRESSION: The radiographic abnormality at the left base corresponds to a 6 mm calcified left lower lobe granuloma. Several other sub cm nodules are not significantly changed compared to a prior abdomen pelvis CT. There are tiny left upper nodules which were not visualized on the prior abdominal CT but are also probably benign.  No acute cardiopulmonary disease.   Electronically Signed   By: Marybelle Killings M.D.   On: 04/12/2015 21:39   I have personally reviewed and evaluated these images and lab results as part of my medical decision-making.   EKG Interpretation None      MDM   Final diagnoses:  UTI (lower urinary tract infection)    Patient with urinary tract infection and positive blood culture. He will be admitted to the hospital for IV antibiotics.   Milton Ferguson, MD 04/13/15 [phone]

## 2015-04-13 NOTE — ED Notes (Signed)
Pt states he was here yesterday, discharged with a UTI. Was called this morning and told to come back because the cultures had an immediately positive result for bacteria in his blood stream. Pt states he feels a little better than he did yesterday, no complaints of pain. States he does feel a little weak but does not appear to be in any obvious distress in triage, vitals within normal limits.

## 2015-04-13 NOTE — Progress Notes (Signed)
ANTIBIOTIC CONSULT NOTE - INITIAL  Pharmacy Consult for Zosyn Indication: GNR bacteremia  Allergies  Allergen Reactions  . Augmentin [Amoxicillin-Pot Clavulanate] Nausea And Vomiting  . Statins Other (See Comments)    Achiness, stiffness per pt  . Zocor [Simvastatin] Other (See Comments)    myalgia    Patient Measurements: Height: 5\' 8"  (172.7 cm) Weight: 199 lb (90.266 kg) IBW/kg (Calculated) : 68.4 Adjusted Body Weight:   Vital Signs: Temp: 100.1 F (37.8 C) (10/06 1814) Temp Source: Oral (10/06 1814) BP: 140/62 mmHg (10/06 1814) Pulse Rate: 72 (10/06 1814) Intake/Output from previous day:   Intake/Output from this shift:    Labs:  Recent Labs  04/12/15 1729 04/13/15 1355  WBC 8.2 9.8  HGB 12.6* 12.4*  PLT 184 156  CREATININE 2.27* 2.46*   Estimated Creatinine Clearance: 31.4 mL/min (by C-G formula based on Cr of 2.46). No results for input(s): VANCOTROUGH, VANCOPEAK, VANCORANDOM, GENTTROUGH, GENTPEAK, GENTRANDOM, TOBRATROUGH, TOBRAPEAK, TOBRARND, AMIKACINPEAK, AMIKACINTROU, AMIKACIN in the last 72 hours.   Microbiology: Recent Results (from the past 720 hour(s))  Culture, blood (routine x 2)     Status: None (Preliminary result)   Collection Time: 04/12/15  5:25 PM  Result Value Ref Range Status   Specimen Description BLOOD LEFT WRIST  Final   Special Requests BOTTLES DRAWN AEROBIC AND ANAEROBIC 5CC  Final   Culture   Final    NO GROWTH < 24 HOURS Performed at [facility]    Report Status PENDING  Incomplete  Culture, blood (routine x 2)     Status: None (Preliminary result)   Collection Time: 04/12/15  5:53 PM  Result Value Ref Range Status   Specimen Description BLOOD RIGHT HAND  Final   Special Requests BOTTLES DRAWN AEROBIC AND ANAEROBIC 5CC  Final   Culture  Setup Time   Final    GRAM NEGATIVE RODS ANAEROBIC BOTTLE ONLY CRITICAL RESULT CALLED TO, READ BACK BY AND VERIFIED WITH: L. MILLER, [place] ON [id number] BY Rhea Bleacher    Culture   Final    NO GROWTH < 24 HOURS Performed at [facility]    Report Status PENDING  Incomplete  Urine culture     Status: None (Preliminary result)   Collection Time: 04/12/15  6:00 PM  Result Value Ref Range Status   Specimen Description URINE, UNSPE  Final   Special Requests NONE  Final   Culture   Final    >=100,000 COLONIES/mL ESCHERICHIA COLI Performed at [facility]    Report Status PENDING  Incomplete    Medical History: Past Medical History  Diagnosis Date  . Bladder rupture 2010  . Allergy   . Depression   . Hyperlipidemia   . Restless leg syndrome, controlled   . Bladder tumor   . Gout   . 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 Cancer [facility])     bladder    Assessment: 40 yoM seen by the ED yesterday for UTI and was called to return since blood cultures obtained during that visit grew GNR.  Patient will be admitted for IV abx.  Pharmacy consulted to dose Zosyn for GNR bacteremia.  SCr 2.46, CrCl~31 ml/min  Goal of Therapy:  Doses adjusted per renal function Eradication of infection  Plan:  1.  Zosyn 3.375g IV q8h (4 hour infusion time). 2.  F/u culture results, SCr, clinical course.  Hershal Coria 04/13/2015,6:41 PM

## 2015-04-14 ENCOUNTER — Encounter (HOSPITAL_COMMUNITY): Payer: Self-pay | Admitting: Internal Medicine

## 2015-04-14 DIAGNOSIS — A4150 Gram-negative sepsis, unspecified: Secondary | ICD-10-CM

## 2015-04-14 DIAGNOSIS — R7881 Bacteremia: Secondary | ICD-10-CM | POA: Diagnosis present

## 2015-04-14 DIAGNOSIS — A4151 Sepsis due to Escherichia coli [E. coli]: Secondary | ICD-10-CM | POA: Diagnosis present

## 2015-04-14 DIAGNOSIS — K219 Gastro-esophageal reflux disease without esophagitis: Secondary | ICD-10-CM

## 2015-04-14 LAB — BASIC METABOLIC PANEL
ANION GAP: 6 (ref 5–15)
BUN: 28 mg/dL — AB (ref 6–20)
CALCIUM: 8.1 mg/dL — AB (ref 8.9–10.3)
CO2: 21 mmol/L — ABNORMAL LOW (ref 22–32)
Chloride: 111 mmol/L (ref 101–111)
Creatinine, Ser: 2.48 mg/dL — ABNORMAL HIGH (ref 0.61–1.24)
GFR calc Af Amer: 29 mL/min — ABNORMAL LOW (ref >60)
GFR, EST NON AFRICAN AMERICAN: 25 mL/min — AB (ref >60)
GLUCOSE: 124 mg/dL — AB (ref 65–99)
POTASSIUM: 4 mmol/L (ref 3.5–5.1)
SODIUM: 138 mmol/L (ref 135–145)

## 2015-04-14 LAB — CBC
HCT: 34.9 % — ABNORMAL LOW (ref 39.0–52.0)
HEMOGLOBIN: 11.7 g/dL — AB (ref 13.0–17.0)
MCH: 30.5 pg (ref 26.0–34.0)
MCHC: 33.5 g/dL (ref 30.0–36.0)
MCV: 91.1 fL (ref 78.0–100.0)
Platelets: 144 10*3/uL — ABNORMAL LOW (ref 150–400)
RBC: 3.83 MIL/uL — ABNORMAL LOW (ref 4.22–5.81)
RDW: 13.5 % (ref 11.5–15.5)
WBC: 7.6 10*3/uL (ref 4.0–10.5)

## 2015-04-14 LAB — URINE CULTURE

## 2015-04-14 MED ORDER — SODIUM CHLORIDE 0.9 % IV SOLN
INTRAVENOUS | Status: AC
  Administered 2015-04-14: 17:00:00 via INTRAVENOUS

## 2015-04-14 MED ORDER — DEXTROSE 5 % IV SOLN
2.0000 g | INTRAVENOUS | Status: DC
  Administered 2015-04-14: 2 g via INTRAVENOUS
  Filled 2015-04-14 (×2): qty 2

## 2015-04-14 NOTE — Care Management Note (Signed)
Case Management Note  Patient Details  Name: [name] MRN: [id number] Date of Birth: 1945-11-06  Subjective/Objective:    69 yo admitted with Sepsis due to Gram negative bacteria                Action/Plan: From home with spouse  Expected Discharge Date:   (UNKNOWN)               Expected Discharge Plan:  Home/Self Care  In-House Referral:     Discharge planning Services  CM Consult  Post Acute Care Choice:    Choice offered to:     DME Arranged:    DME Agency:     HH Arranged:    HH Agency:     Status of Service:  In process, will continue to follow  Medicare Important Message Given:    Date Medicare IM Given:    Medicare IM give by:    Date Additional Medicare IM Given:    Additional Medicare Important Message give by:     If discussed at [place] of Stay Meetings, dates discussed:    Additional Comments: Chart reviewed and CM following for DC needs. Lynnell Catalan, RN 04/14/2015, 1:29 PM

## 2015-04-14 NOTE — Progress Notes (Signed)
Patient ID: [name], male   DOB: 1945-07-12, 69 y.o.   MRN: [id number]  [facility] PROGRESS NOTE  [name] JGO:[id number] DOB: [birth year] DOA: 04/13/2015 PCP: Austin Fraction, MD   Brief narrative:    Pt is 69 yo male with history of bladder cancer, presented to [facility] ED after he was called by doctor to come back after his blood cultures from 10/05 resulted back with gram negative rods. Pt presented 10/05 to [facility] ED with main concern of fevers and urinary urgency and dysuria and was discharged home with Cephalexin. Pt now back for reason outlined above and reports persistent fevers, chills at home with dysuria. Pt reports lower quadrant throbbing pain, intermittent in nature with 7/10 in severity when present, no specific alleviating or aggravating factors, non radiating.  In ED, pt noted to be hemodynamically stable, VS notable for Tmax 100.14F, HR up to 86, RR up to 24, blood cultures obtained and [facility] asked to admit for for further evaluation.   Assessment/Plan:    Principal Problem:  Sepsis due to Gram negative bacteria ([place]) - pt met criteria for sepsis with Tmax 100.6 F with RR up to 24 - source likely urinary from E. Coli, please note that urine culture is positive for E. Coli - will transition to Rocephin for now and will await for blood cultures before we change to PO ABX  - Tmax 101.5 F since admission   Active Problems:  UTI (lower urinary tract infection) - E. Coli, continue with Rocephin today and transition to PO ABX once blood cultures are back    Acute renal failure superimposed on stage 3 chronic kidney disease ([place]) - with baseline Cr 2 in the past year - Cr still elevated, continue with IVF and repeat BMP In AM   Bacteremia due to Gram-negative bacteria (HCC) - change ABX to Rocephin based on urine culture and sensitivity report but no change to PO ABX yet  - follow up on repeat blood cultures and follow up on the cultures from 10/05   Anemia of  chronic kidney failure - no sings of active bleeding   Acute hyponatremia - appears to be pre renal  - IVF have been provided and Na is WNL    GERD (gastroesophageal reflux disease) - stable    Hyperlipidemia - continue statin    Bladder cancer ([place]) - outpatient follow up    DVT prophylaxis - Lovenox SQ   Code Status: Full.  Family Communication:  plan of care discussed with the patient Disposition Plan: Home when stable.   IV access:  Peripheral IV  Procedures and diagnostic studies:    Dg Chest 2 View  04/12/2015   CLINICAL DATA:  Chills and fever for 1 day. History of prostate and bladder carcinoma  EXAM: CHEST  2 VIEW  COMPARISON:  January 13, 2013  FINDINGS: There is a 6 mm nodular opacity in the lateral left base. Lungs elsewhere clear. Heart size and pulmonary vascularity are normal. No adenopathy. No bone lesions.  IMPRESSION: 6 mm nodular opacity lateral left base, not present previously. Advise noncontrast enhanced chest CT to further assess. No edema or consolidation.   Electronically Signed   By: Lowella Grip III M.D.   On: 04/12/2015 17:23   Ct Chest Wo Contrast  04/12/2015   CLINICAL DATA:  Abnormal chest x-ray  EXAM: CT CHEST WITHOUT CONTRAST  TECHNIQUE: Multidetector CT imaging of the chest was performed following the standard protocol without IV contrast.  COMPARISON:  CT abdomen 04/13/2013  FINDINGS: The radiographic abnormality corresponds to a stable 6 mm calcified granuloma in the left lower lobe. Tiny left lower lobe nodule on image 40 is stable. Calcified granuloma in the left lower lobe on image 35 is stable. 5 mm left lower lobe nodule on image 31 was not imaged on the prior study. 4 mm left upper lobe nodule on image 18. 5 mm right lower lobe pulmonary nodule on image 35 is stable. 5 mm right lower lobe pulmonary nodule on image 46 is stable.  Calcified left hilar nodes.  No abnormal mediastinal adenopathy.  No pneumothorax or pleural effusion.  No  vertebral compression deformity. Prominent posterior disc osteophytes at C6-7.  IMPRESSION: The radiographic abnormality at the left base corresponds to a 6 mm calcified left lower lobe granuloma. Several other sub cm nodules are not significantly changed compared to a prior abdomen pelvis CT. There are tiny left upper nodules which were not visualized on the prior abdominal CT but are also probably benign.  No acute cardiopulmonary disease.   Electronically Signed   By: Marybelle Killings M.D.   On: 04/12/2015 21:39    Medical Consultants:  None  Other Consultants:  None  IAnti-Infectives:   Zosyn 10/06 --> 10/07 Rocephin 10/07 -->  [name], MD  [facility] Pager [phone]  If 7PM-7AM, please contact night-coverage [email or web address] Password [email or web address] 04/14/2015, 11:31 AM   LOS: 1 day   HPI/Subjective: No events overnight.   Objective: Filed Vitals:   04/13/15 1950 04/13/15 2051 04/13/15 2209 04/14/15 0509  BP:  120/55  102/58  Pulse: 88 79  60  Temp:  101.6 F (38.7 C) 100.4 F (38 C) 98 F (36.7 C)  TempSrc:  Oral  Oral  Resp:  18  16  Height:      Weight:    90.13 kg (198 lb 11.2 oz)  SpO2:  97%  97%    Intake/Output Summary (Last 24 hours) at 04/14/15 1131 Last data filed at 04/14/15 0939  Gross per 24 hour  Intake   2385 ml  Output   1375 ml  Net   1010 ml    Exam:   General:  Pt is alert, follows commands appropriately, not in acute distress  Cardiovascular: Regular rate and rhythm, S1/S2, no murmurs, no rubs, no gallops  Respiratory: Clear to auscultation bilaterally, no wheezing, no crackles, no rhonchi  Abdomen: Soft, non tender, non distended, bowel sounds present, no guarding  Data Reviewed: Basic Metabolic Panel:  Recent Labs Lab 04/12/15 1729 04/13/15 1355 04/14/15 0435  NA 136 132* 138  K 3.8 3.8 4.0  CL 108 105 111  CO2 22 19* 21*  GLUCOSE 88 170* 124*  BUN 32* 29* 28*  CREATININE 2.27* 2.46* 2.48*  CALCIUM 8.4* 8.1* 8.1*   Liver  Function Tests:  Recent Labs Lab 04/12/15 1729  AST 26  ALT 25  ALKPHOS 119  BILITOT 0.3  PROT 7.3  ALBUMIN 3.9   CBC:  Recent Labs Lab 04/12/15 1729 04/13/15 1355 04/14/15 0435  WBC 8.2 9.8 7.6  HGB 12.6* 12.4* 11.7*  HCT 37.6* 37.7* 34.9*  MCV 89.7 90.4 91.1  PLT 184 156 144*   Recent Results (from the past 240 hour(s))  Culture, blood (routine x 2)     Status: None (Preliminary result)   Collection Time: 04/12/15  5:25 PM  Result Value Ref Range Status   Specimen Description BLOOD LEFT WRIST  Final   Special Requests BOTTLES DRAWN AEROBIC AND  ANAEROBIC 5CC  Final   Culture   Final    NO GROWTH 2 DAYS Performed at [facility]    Report Status PENDING  Incomplete  Culture, blood (routine x 2)     Status: None (Preliminary result)   Collection Time: 04/12/15  5:53 PM  Result Value Ref Range Status   Specimen Description BLOOD RIGHT HAND  Final   Special Requests BOTTLES DRAWN AEROBIC AND ANAEROBIC 5CC  Final   Culture  Setup Time   Final    GRAM NEGATIVE RODS ANAEROBIC BOTTLE ONLY CRITICAL RESULT CALLED TO, READ BACK BY AND VERIFIED WITH: L. MILLER, [place] [id number] ON [id number] BY Rhea Bleacher    Culture   Final    GRAM NEGATIVE RODS Performed at [facility]    Report Status PENDING  Incomplete  Urine culture     Status: None   Collection Time: 04/12/15  6:00 PM  Result Value Ref Range Status   Specimen Description URINE, RANDOM  Final   Special Requests NONE  Final   Culture   Final    >=100,000 COLONIES/mL ESCHERICHIA COLI Performed at [facility]    Report Status 04/14/2015 FINAL  Final   Organism ID, Bacteria ESCHERICHIA COLI  Final      Susceptibility   Escherichia coli - MIC*    AMPICILLIN <=2 SENSITIVE Sensitive     CEFAZOLIN <=4 SENSITIVE Sensitive     CEFTRIAXONE <=1 SENSITIVE Sensitive     CIPROFLOXACIN <=0.25 SENSITIVE Sensitive     GENTAMICIN <=1 SENSITIVE Sensitive     IMIPENEM <=0.25 SENSITIVE Sensitive      NITROFURANTOIN <=16 SENSITIVE Sensitive     TRIMETH/SULFA <=20 SENSITIVE Sensitive     AMPICILLIN/SULBACTAM <=2 SENSITIVE Sensitive     PIP/TAZO <=4 SENSITIVE Sensitive     * >=100,000 COLONIES/mL ESCHERICHIA COLI  Culture, blood (routine x 2)     Status: None (Preliminary result)   Collection Time: 04/13/15  7:50 PM  Result Value Ref Range Status   Specimen Description BLOOD LEFT HAND  Final   Special Requests BOTTLES DRAWN AEROBIC ONLY [place]  Final   Culture   Final    NO GROWTH < 12 HOURS Performed at [facility]    Report Status PENDING  Incomplete  Culture, blood (routine x 2)     Status: None (Preliminary result)   Collection Time: 04/13/15  7:55 PM  Result Value Ref Range Status   Specimen Description BLOOD LEFT ARM  Final   Special Requests BOTTLES DRAWN AEROBIC AND ANAEROBIC  10CC  Final   Culture   Final    NO GROWTH < 12 HOURS Performed at [facility]    Report Status PENDING  Incomplete     Scheduled Meds: . aspirin EC  81 mg Oral Daily  . clonazePAM  0.5 mg Oral BID  . colchicine  0.6 mg Oral Daily  . enoxaparin (LOVENOX) injection  30 mg Subcutaneous Q24H  . escitalopram  20 mg Oral Daily  . ezetimibe  10 mg Oral q morning - 10a  . febuxostat  40 mg Oral Daily  . fluticasone  2 spray Each Nare Daily  . pantoprazole  40 mg Oral Daily  . piperacillin-tazobactam (ZOSYN)  IV  3.375 g Intravenous Q8H  . rOPINIRole  3 mg Oral QHS   Continuous Infusions: . sodium chloride 75 mL/hr at 04/14/15 0357

## 2015-04-14 NOTE — Plan of Care (Signed)
Problem: Phase I Progression Outcomes Goal: Voiding-avoid urinary catheter unless indicated Outcome: Not Applicable Date Met:  77/93/90 Has urostomy

## 2015-04-14 NOTE — Plan of Care (Signed)
Problem: Phase I Progression Outcomes Goal: Hemodynamically stable Outcome: Progressing Fever 101.6 at 2100 10/6

## 2015-04-15 DIAGNOSIS — A4151 Sepsis due to Escherichia coli [E. coli]: Principal | ICD-10-CM

## 2015-04-15 LAB — BASIC METABOLIC PANEL
Anion gap: 6 (ref 5–15)
BUN: 26 mg/dL — AB (ref 6–20)
CHLORIDE: 113 mmol/L — AB (ref 101–111)
CO2: 23 mmol/L (ref 22–32)
CREATININE: 2.16 mg/dL — AB (ref 0.61–1.24)
Calcium: 8.1 mg/dL — ABNORMAL LOW (ref 8.9–10.3)
GFR calc Af Amer: 34 mL/min — ABNORMAL LOW (ref >60)
GFR calc non Af Amer: 30 mL/min — ABNORMAL LOW (ref >60)
Glucose, Bld: 106 mg/dL — ABNORMAL HIGH (ref 65–99)
POTASSIUM: 4.1 mmol/L (ref 3.5–5.1)
SODIUM: 142 mmol/L (ref 135–145)

## 2015-04-15 LAB — CULTURE, BLOOD (ROUTINE X 2)

## 2015-04-15 LAB — CBC
HEMATOCRIT: 35.1 % — AB (ref 39.0–52.0)
HEMOGLOBIN: 11.8 g/dL — AB (ref 13.0–17.0)
MCH: 30.7 pg (ref 26.0–34.0)
MCHC: 33.6 g/dL (ref 30.0–36.0)
MCV: 91.4 fL (ref 78.0–100.0)
Platelets: 163 10*3/uL (ref 150–400)
RBC: 3.84 MIL/uL — AB (ref 4.22–5.81)
RDW: 13.7 % (ref 11.5–15.5)
WBC: 5.5 10*3/uL (ref 4.0–10.5)

## 2015-04-15 LAB — URINE CULTURE: Culture: NO GROWTH

## 2015-04-15 MED ORDER — CIPROFLOXACIN HCL 500 MG PO TABS: 500.0000 mg | ORAL_TABLET | Freq: Two times a day (BID) | ORAL | Status: DC

## 2015-04-15 MED ORDER — HYDROCODONE-ACETAMINOPHEN 5-325 MG PO TABS: 1.0000 | ORAL_TABLET | ORAL | Status: DC | PRN

## 2015-04-15 MED ORDER — ENOXAPARIN SODIUM 40 MG/0.4ML ~~LOC~~ SOLN: 40.0000 mg | SUBCUTANEOUS | Status: DC

## 2015-04-15 NOTE — Discharge Summary (Signed)
Physician Discharge Summary  [name] YBW:[id number] DOB: 18-Mar-1946 DOA: 04/13/2015  PCP: [name], MD  Admit date: 04/13/2015 Discharge date: 04/15/2015  Recommendations for Outpatient Follow-up:  1. Pt will need to follow up with PCP in 2-3 weeks post discharge 2. Please obtain BMP to evaluate electrolytes and kidney function 3. Please also check CBC to evaluate Hg and Hct levels  Discharge Diagnoses:  Principal Problem:   Sepsis due to Escherichia coli (E. coli) (HCC) Active Problems:   UTI (lower urinary tract infection)   Acute renal failure superimposed on stage 3 chronic kidney disease (HCC)   Bacteremia due to Gram-negative bacteria (HCC), E. Coli   Anemia of chronic kidney failure   Acute hyponatremia   GERD (gastroesophageal reflux disease)   Hyperlipidemia   Bladder cancer ([place])    Discharge Condition: Stable  Diet recommendation: Heart healthy diet discussed in details    Brief narrative:    Pt is 69 yo male with history of bladder cancer, presented to [name] ED after he was called by doctor to come back after his blood cultures from 10/05 resulted back with gram negative rods. Pt presented 10/05 to [name] ED with main concern of fevers and urinary urgency and dysuria and was discharged home with Cephalexin. Pt now back for reason outlined above and reports persistent fevers, chills at home with dysuria. Pt reports lower quadrant throbbing pain, intermittent in nature with 7/10 in severity when present, no specific alleviating or aggravating factors, non radiating.  In ED, pt noted to be hemodynamically stable, VS notable for Tmax 100.32F, HR up to 86, RR up to 24, blood cultures obtained and [facility] asked to admit for for further evaluation.   Assessment/Plan:    Principal Problem:  Sepsis due to Gram negative bacteria ([name]) - pt met criteria for sepsis with Tmax 100.6 F with RR up to 24 - source likely urinary from E. Coli, please note that urine culture  is positive for E. Coli - based on sensitivity report, change to Cipro upon discharge as pt wants to go home today   Active Problems:  UTI (lower urinary tract infection) - E. Coli, continue with Cipro as noted above    Acute renal failure superimposed on stage 3 chronic kidney disease ([place]) - with baseline Cr 2 in the past year - Cr remains close to baseline    Bacteremia due to Gram-negative bacteria (HCC), E. COLI - change ABX to Cipro based on urine culture and sensitivity report    Anemia of chronic kidney failure - no sings of active bleeding   Acute hyponatremia - appears to be pre renal  - IVF have been provided and Na is WNL    GERD (gastroesophageal reflux disease) - stable    Hyperlipidemia - continue statin    Bladder cancer ([name]) - outpatient follow up   Code Status: Full.  Family Communication: plan of care discussed with the patient Disposition Plan: Home   IV access:  Peripheral IV  Procedures and diagnostic studies:    Imaging Results    Dg Chest 2 View  04/12/2015 CLINICAL DATA: Chills and fever for 1 day. History of prostate and bladder carcinoma EXAM: CHEST 2 VIEW COMPARISON: January 13, 2013 FINDINGS: There is a 6 mm nodular opacity in the lateral left base. Lungs elsewhere clear. Heart size and pulmonary vascularity are normal. No adenopathy. No bone lesions. IMPRESSION: 6 mm nodular opacity lateral left base, not present previously. Advise noncontrast enhanced chest CT to further  assess. No edema or consolidation. Electronically Signed By: Lowella Grip III M.D. On: 04/12/2015 17:23   Ct Chest Wo Contrast  04/12/2015 CLINICAL DATA: Abnormal chest x-ray EXAM: CT CHEST WITHOUT CONTRAST TECHNIQUE: Multidetector CT imaging of the chest was performed following the standard protocol without IV contrast. COMPARISON: CT abdomen 04/13/2013 FINDINGS: The radiographic abnormality corresponds to a stable 6 mm calcified  granuloma in the left lower lobe. Tiny left lower lobe nodule on image 40 is stable. Calcified granuloma in the left lower lobe on image 35 is stable. 5 mm left lower lobe nodule on image 31 was not imaged on the prior study. 4 mm left upper lobe nodule on image 18. 5 mm right lower lobe pulmonary nodule on image 35 is stable. 5 mm right lower lobe pulmonary nodule on image 46 is stable. Calcified left hilar nodes. No abnormal mediastinal adenopathy. No pneumothorax or pleural effusion. No vertebral compression deformity. Prominent posterior disc osteophytes at C6-7. IMPRESSION: The radiographic abnormality at the left base corresponds to a 6 mm calcified left lower lobe granuloma. Several other sub cm nodules are not significantly changed compared to a prior abdomen pelvis CT. There are tiny left upper nodules which were not visualized on the prior abdominal CT but are also probably benign. No acute cardiopulmonary disease. Electronically Signed By: Marybelle Killings M.D. On: 04/12/2015 21:39     Medical Consultants:  None  Other Consultants:  None  IAnti-Infectives:   Zosyn 10/06 --> 10/07 Rocephin 10/07 --> transitioned to Cipro upon discharge       Discharge Exam: Filed Vitals:   04/15/15 0525  BP: 128/57  Pulse: 56  Temp: 98.3 F (36.8 C)  Resp: 17   Filed Vitals:   04/14/15 0509 04/14/15 1345 04/14/15 2120 04/15/15 0525  BP: 102/58 121/64 130/53 128/57  Pulse: 60 62 70 56  Temp: 98 F (36.7 C) 97.7 F (36.5 C) 97.9 F (36.6 C) 98.3 F (36.8 C)  TempSrc: Oral Oral Oral Oral  Resp: '16 16 17 17  ' Height:      Weight: 90.13 kg (198 lb 11.2 oz)   91.4 kg (201 lb 8 oz)  SpO2: 97% 100% 98% 97%    General: Pt is alert, follows commands appropriately, not in acute distress Cardiovascular: Regular rate and rhythm, no rubs, no gallops Respiratory: Clear to auscultation bilaterally, no wheezing, no crackles, no rhonchi Abdominal: Soft, non tender, non distended,  bowel sounds +, no guarding  Discharge Instructions  Discharge Instructions    Diet - low sodium heart healthy    Complete by:  As directed      Increase activity slowly    Complete by:  As directed             Medication List    STOP taking these medications        cephALEXin 500 MG capsule  Commonly known as:  KEFLEX      TAKE these medications        acetaminophen 500 MG tablet  Commonly known as:  TYLENOL  Take 500 mg by mouth every 6 (six) hours as needed for moderate pain.     aspirin 81 MG tablet  Take 81 mg by mouth daily.     ciprofloxacin 500 MG tablet  Commonly known as:  CIPRO  Take 1 tablet (500 mg total) by mouth 2 (two) times daily.     clonazePAM 0.5 MG tablet  Commonly known as:  KLONOPIN  Take 0.5 mg by mouth 2 (  two) times daily.     colchicine 0.6 MG tablet  Take 0.6 mg by mouth. 2 tablets on onset of flare up, then 1 tablet an hour later and then 1 tablet daily until gout is gone     escitalopram 20 MG tablet  Commonly known as:  LEXAPRO  Take 1 tablet (20 mg total) by mouth daily.     ezetimibe 10 MG tablet  Commonly known as:  ZETIA  Take 1 tablet (10 mg total) by mouth every morning.     febuxostat 40 MG tablet  Commonly known as:  ULORIC  Take 1 tablet (40 mg total) by mouth daily.     fluticasone 50 MCG/ACT nasal spray  Commonly known as:  FLONASE  Place 2 sprays into both nostrils daily.     HYDROcodone-acetaminophen 5-325 MG tablet  Commonly known as:  NORCO/VICODIN  Take 1-2 tablets by mouth every 4 (four) hours as needed for moderate pain.     omeprazole 20 MG capsule  Commonly known as:  PRILOSEC  Take 1 capsule (20 mg total) by mouth daily.     ondansetron 4 MG disintegrating tablet  Commonly known as:  ZOFRAN ODT  36m ODT q4 hours prn nausea/vomit     rOPINIRole 3 MG tablet  Commonly known as:  REQUIP  Take 1 tablet (3 mg total) by mouth at bedtime.     traZODone 100 MG tablet  Commonly known as:  DESYREL  Take  50 mg by mouth daily as needed for sleep.           Follow-up Information    Follow up with POdette Fraction MD.   Specialty:  Family Medicine   Contact information:   [id number] [phone]      Call MFaye Ramsay MD.   Specialty:  Internal Medicine   Why:  As needed call my cell phone [phone]  Contact information:   [address] [phone]       The results of significant diagnostics from this hospitalization (including imaging, microbiology, ancillary and laboratory) are listed below for reference.     Microbiology: Recent Results (from the past 240 hour(s))  Culture, blood (routine x 2)     Status: None (Preliminary result)   Collection Time: 04/12/15  5:25 PM  Result Value Ref Range Status   Specimen Description BLOOD LEFT WRIST  Final   Special Requests BOTTLES DRAWN AEROBIC AND ANAEROBIC 5CC  Final   Culture   Final    NO GROWTH 2 DAYS [facility]   Report Status PENDING  Incomplete  Culture, blood (routine x 2)     Status: None   Collection Time: 04/12/15  5:53 PM  Result Value Ref Range Status   Specimen Description BLOOD RIGHT HAND  Final   Special Requests BOTTLES DRAWN AEROBIC AND ANAEROBIC 5CC  Final   Culture  Setup Time   Final    GRAM NEGATIVE RODS ANAEROBIC BOTTLE ONLY CRITICAL RESULT CALLED TO, READ BACK BY AND VERIFIED WITH: L. MILLER, F[id number] ON [id number] SRhea Bleacher   Culture   Final    [facility]ERICHIA COLI [facility]   Report Status 04/15/2015 FINAL  Final   Organism ID, Bacteria ESCHERICHIA COLI  Final      Susceptibility   Escherichia coli - MIC*    AMPICILLIN <=  2 SENSITIVE Sensitive     CEFAZOLIN <=4 SENSITIVE Sensitive     CEFEPIME <=1 SENSITIVE Sensitive     CEFTAZIDIME <=1 SENSITIVE Sensitive     CEFTRIAXONE <=1 SENSITIVE Sensitive     CIPROFLOXACIN <=0.25 SENSITIVE  Sensitive     GENTAMICIN <=1 SENSITIVE Sensitive     IMIPENEM <=0.25 SENSITIVE Sensitive     TRIMETH/SULFA <=20 SENSITIVE Sensitive     AMPICILLIN/SULBACTAM <=2 SENSITIVE Sensitive     PIP/TAZO <=4 SENSITIVE Sensitive     * ESCHERICHIA COLI  Urine culture     Status: None   Collection Time: 04/12/15  6:00 PM  Result Value Ref Range Status   Specimen Description URINE, RANDOM  Final   Special Requests NONE  Final   Culture   Final    >=100,000 COLONIES/mL ESCHERICHIA COLI Performed at [facility]    Report Status 04/14/2015 FINAL  Final   Organism ID, Bacteria ESCHERICHIA COLI  Final      Susceptibility   Escherichia coli - MIC*    AMPICILLIN <=2 SENSITIVE Sensitive     CEFAZOLIN <=4 SENSITIVE Sensitive     CEFTRIAXONE <=1 SENSITIVE Sensitive     CIPROFLOXACIN <=0.25 SENSITIVE Sensitive     GENTAMICIN <=1 SENSITIVE Sensitive     IMIPENEM <=0.25 SENSITIVE Sensitive     NITROFURANTOIN <=16 SENSITIVE Sensitive     TRIMETH/SULFA <=20 SENSITIVE Sensitive     AMPICILLIN/SULBACTAM <=2 SENSITIVE Sensitive     PIP/TAZO <=4 SENSITIVE Sensitive     * >=100,000 COLONIES/mL ESCHERICHIA COLI  Culture, blood (routine x 2)     Status: None (Preliminary result)   Collection Time: 04/13/15  7:50 PM  Result Value Ref Range Status   Specimen Description BLOOD LEFT HAND  Final   Special Requests BOTTLES DRAWN AEROBIC ONLY 6CC  Final   Culture   Final    NO GROWTH < 24 HOURS Performed at [facility]    Report Status PENDING  Incomplete  Culture, blood (routine x 2)     Status: None (Preliminary result)   Collection Time: 04/13/15  7:55 PM  Result Value Ref Range Status   Specimen Description BLOOD LEFT ARM  Final   Special Requests BOTTLES DRAWN AEROBIC AND ANAEROBIC  10CC  Final   Culture   Final    NO GROWTH < 24 HOURS Performed at [facility]    Report Status PENDING  Incomplete     Labs: Basic Metabolic Panel:  Recent Labs Lab 04/12/15 1729  04/13/15 1355 04/14/15 0435 04/15/15 0400  NA 136 132* 138 142  K 3.8 3.8 4.0 4.1  CL 108 105 111 113*  CO2 22 19* 21* 23  GLUCOSE 88 170* 124* 106*  BUN 32* 29* 28* 26*  CREATININE 2.27* 2.46* 2.48* 2.16*  CALCIUM 8.4* 8.1* 8.1* 8.1*   Liver Function Tests:  Recent Labs Lab 04/12/15 1729  AST 26  ALT 25  ALKPHOS 119  BILITOT 0.3  PROT 7.3  ALBUMIN 3.9   CBC:  Recent Labs Lab 04/12/15 1729 04/13/15 1355 04/14/15 0435 04/15/15 0400  WBC 8.2 9.8 7.6 5.5  HGB 12.6* 12.4* 11.7* 11.8*  HCT 37.6* 37.7* 34.9* 35.1*  MCV 89.7 90.4 91.1 91.4  PLT 184 156 144* 163    SIGNED: Time coordinating discharge: 30 minutes  MAGICK-Soriah Leeman, MD  [facility] 04/15/2015, 8:57 AM Pager [phone]  If 7PM-7AM, please contact night-coverage [email or web address] Password [email or web address]

## 2015-04-15 NOTE — Discharge Instructions (Signed)
Bacteremia °Bacteremia is the presence of bacteria in the blood. A small amount of bacteria may not cause any symptoms. °Sometimes, the bacteria spread and cause infection in other parts of the body, such as the heart, joints, bones, or brain. Having a great amount of bacteria can cause a serious, sometimes life-threatening infection called sepsis. °CAUSES °This condition is caused by bacteria that get into the blood. Bacteria can enter the blood: °· During a dental or medical procedure. °· After you brush your teeth so hard that the gums bleed. °· Through a scrape or cut on your skin. °More severe types of bacteremia can be caused by: °· A bacterial infection, such as pneumonia, that spreads to the blood. °· Using a dirty needle. °RISK FACTORS °This condition is more likely to develop in: °· Children and elderly adults. °· People who have a long-lasting (chronic) disease or medical condition. °· People who have an artificial joint or heart valve. °· People who have heart valve disease. °· People who have a tube, such as a catheter or IV tube, that has been inserted for a medical treatment. °· People who have a weak body defense system (immune system). °· People who use IV drugs. °SYMPTOMS °Usually, this condition does not cause symptoms when it is mild. When it is more serious, it may cause: °· Fever. °· Chills. °· Racing heart. °· Shortness of breath. °· Dizziness. °· Weakness. °· Confusion. °· Nausea or vomiting. °· Diarrhea. °Bacteremia that has spread to other parts of the body may cause symptoms in those areas. °DIAGNOSIS °This condition may be diagnosed with a physical exam and tests, such as: °· A complete blood count (CBC). This test looks for signs of infection. °· Blood cultures. These look for bacteria in your blood. °· Tests of any IV tubes. These look for a source of infection. °· Urine tests. °· Imaging tests, such as an X-ray, CT scan, MRI, or heart ultrasound. °TREATMENT °If the condition is mild,  treatment is usually not needed. Usually, the body's immune system will remove the bacteria. If the condition is more serious, it may be treated with: °· Antibiotic medicines through an IV tube. These may be given for about 2 weeks. At first, the antibiotic that is given may kill most types of blood bacteria. If your test results show that a certain kind of bacteria is causing problems, the antibiotic may be changed to kill only the bacteria that are causing problems. °· Antibiotics taken by mouth. °· Removing any catheter or IV tube that is a source of infection. °· Blood pressure and breathing support, if needed. °· Surgery to control the source or spread of infection, if needed. °HOME CARE INSTRUCTIONS °· Take over-the-counter and prescription medicines only as told by your health care provider. °· If you were prescribed an antibiotic, take it as told by your health care provider. Do not stop taking the antibiotic even if you start to feel better. °· Rest at home until your condition is under control. °· Drink enough fluid to keep your urine clear or pale yellow. °· Keep all follow-up visits as told by your health care provider. This is important. °PREVENTION °Take these actions to help prevent future episodes of bacteremia: °· Get all vaccinations as recommended by your health care provider. °· Clean and cover scrapes or cuts. °· Bathe regularly. °· Wash your hands often. °· Before any dental or surgical procedure, ask your health care provider if you should take an antibiotic. °SEEK MEDICAL   CARE IF: °· Your symptoms get worse. °· You continue to have symptoms after treatment. °· You develop new symptoms after treatment. °SEEK IMMEDIATE MEDICAL CARE IF: °· You have chest pain or trouble breathing. °· You develop confusion, dizziness, or weakness. °· You develop pale skin. °  °This information is not intended to replace advice given to you by your health care provider. Make sure you discuss any questions you have  with your health care provider. °  °Document Released: 04/07/2006 Document Revised: 03/15/2015 Document Reviewed: 08/27/2014 °Elsevier Interactive Patient Education ©2016 [employer] ° °

## 2015-04-16 ENCOUNTER — Telehealth: Payer: Self-pay | Admitting: *Deleted

## 2015-04-16 ENCOUNTER — Telehealth (HOSPITAL_COMMUNITY): Payer: Self-pay

## 2015-04-16 LAB — CULTURE, BLOOD (ROUTINE X 2)

## 2015-04-16 NOTE — Telephone Encounter (Signed)
Post ED Visit - Positive Culture Follow-up  Culture report reviewed by antimicrobial stewardship pharmacist:  []  Heide Guile, Pharm.D., BCPS []  Alycia Rossetti, Pharm.D., BCPS []  [place], Pharm.D., BCPS, AAHIVP []  Legrand Como, Pharm.D., BCPS, AAHIVP []  [place], Pharm.D. [x]  Milus Glazier, Pharm.D.  Positive urine culture, >/= 100,000 colonies -> E Coli Treated with Cephalexin, organism sensitive to the same and no further patient follow-up is required at this time.  Dortha Kern 04/16/2015, 3:21 AM

## 2015-04-16 NOTE — ED Notes (Signed)
(+)  blood culture, treated with Cephalexin, OK per T. Marc Morgans

## 2015-04-17 NOTE — Telephone Encounter (Signed)
Post ED Visit - Positive Culture Follow-up  Culture report reviewed by antimicrobial stewardship pharmacist:  []  Heide Guile, Pharm.D., BCPS []  Alycia Rossetti, Pharm.D., BCPS []  [place], Pharm.D., BCPS, AAHIVP []  Legrand Como, Pharm.D., BCPS, AAHIVP []  [place], Pharm.D. []  Milus Glazier, Pharm.D.  Positive blood culture Treated with cefazolin, organism sensitive to the same and no further patient follow-up is required at this time.  Hazle Nordmann 04/17/2015, 8:50 AM

## 2015-04-18 ENCOUNTER — Other Ambulatory Visit (HOSPITAL_COMMUNITY): Payer: Medicare Other

## 2015-04-18 LAB — CULTURE, BLOOD (ROUTINE X 2)
CULTURE: NO GROWTH
Culture: NO GROWTH

## 2015-04-25 NOTE — Addendum Note (Signed)
Addended by: Diana Eves on: 04/25/2015 01:43 PM   Modules accepted: Orders

## 2015-04-28 ENCOUNTER — Ambulatory Visit (INDEPENDENT_AMBULATORY_CARE_PROVIDER_SITE_OTHER): Payer: Medicare Other | Admitting: Family Medicine

## 2015-04-28 ENCOUNTER — Encounter: Payer: Self-pay | Admitting: Family Medicine

## 2015-04-28 ENCOUNTER — Ambulatory Visit: Payer: Medicare Other | Admitting: Cardiovascular Disease

## 2015-04-28 VITALS — BP 120/68 | HR 68 | Temp 97.5°F | Resp 20 | Wt 210.0 lb

## 2015-04-28 DIAGNOSIS — D631 Anemia in chronic kidney disease: Secondary | ICD-10-CM | POA: Diagnosis not present

## 2015-04-28 DIAGNOSIS — Z09 Encounter for follow-up examination after completed treatment for conditions other than malignant neoplasm: Secondary | ICD-10-CM | POA: Diagnosis not present

## 2015-04-28 DIAGNOSIS — Z23 Encounter for immunization: Secondary | ICD-10-CM

## 2015-04-28 LAB — CBC WITH DIFFERENTIAL/PLATELET
BASOS PCT: 1 % (ref 0–1)
Basophils Absolute: 0.1 10*3/uL (ref 0.0–0.1)
EOS ABS: 0.1 10*3/uL (ref 0.0–0.7)
Eosinophils Relative: 1 % (ref 0–5)
HCT: 37.7 % — ABNORMAL LOW (ref 39.0–52.0)
Hemoglobin: 12.6 g/dL — ABNORMAL LOW (ref 13.0–17.0)
LYMPHS ABS: 1.3 10*3/uL (ref 0.7–4.0)
Lymphocytes Relative: 22 % (ref 12–46)
MCH: 29.6 pg (ref 26.0–34.0)
MCHC: 33.4 g/dL (ref 30.0–36.0)
MCV: 88.5 fL (ref 78.0–100.0)
MONOS PCT: 7 % (ref 3–12)
MPV: 9.1 fL (ref 8.6–12.4)
Monocytes Absolute: 0.4 10*3/uL (ref 0.1–1.0)
NEUTROS ABS: 4.1 10*3/uL (ref 1.7–7.7)
NEUTROS PCT: 69 % (ref 43–77)
PLATELETS: 261 10*3/uL (ref 150–400)
RBC: 4.26 MIL/uL (ref 4.22–5.81)
RDW: 13.8 % (ref 11.5–15.5)
WBC: 5.9 10*3/uL (ref 4.0–10.5)

## 2015-04-28 NOTE — Addendum Note (Signed)
Addended by: Olena Mater on: 04/28/2015 02:53 PM   Modules accepted: Orders

## 2015-04-28 NOTE — Progress Notes (Signed)
Subjective:    Patient ID: [name], male    DOB: 10-20-1945, 69 y.o.   MRN: [id number]  HPI Patient recently admitted to the hospital with bacteremia secondary to Escherichia coli that it spread from urinary tract infection. Patient's symptoms began suddenly. He developed rigors, chills and fever. He went to the emergency room where he was found to have a urinary tract infection. Blood culture subsequently revealed Escherichia coli that was pansensitive. Patient was treated with over 14 days of Cipro. Repeat blood cultures obtained on October 6 showed no growth to date. Patient has been off and a box now proximally 3 days. He is afebrile. He denies fevers or chills. He denies nausea or vomiting. He denies any diarrhea. Past Medical History  Diagnosis Date  . Bladder rupture 2010  . Allergy   . Depression   . Hyperlipidemia   . Restless leg syndrome, controlled   . Bladder tumor   . Gout   . 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 Cancer ([place])     bladder  . Bacteremia due to Gram-negative bacteria ([place]) 04/13/2015   Past Surgical History  Procedure Laterality Date  . Appendectomy    . Nasal septum surgery    . Tonsillectomy and adenoidectomy    . Fracture surgery Right 01/05/2005    lower leg/ankle  . Bladder surgery  2010  . Transurethral resection of bladder tumor with gyrus (turbt-gyrus) N/A 04/21/2013    Procedure: TRANSURETHRAL RESECTION OF BLADDER TUMOR WITH GYRUS (TURBT-GYRUS), cystoscopy;  Surgeon: Molli Hazard, MD;  Location: [facility];  Service: Urology;  Laterality: N/A;  . Cystoscopy w/ retrogrades Bilateral 04/21/2013    Procedure: CYSTOSCOPY WITH RETROGRADE PYELOGRAM;  Surgeon: Molli Hazard, MD;  Location: [facility];  Service: Urology;  Laterality: Bilateral;   Current Outpatient Prescriptions on File Prior to Visit  Medication Sig Dispense Refill  . acetaminophen (TYLENOL) 500 MG tablet Take 500 mg by mouth every 6 (six) hours as needed  for moderate pain.     [name] aspirin 81 MG tablet Take 81 mg by mouth daily.    . ciprofloxacin (CIPRO) 500 MG tablet Take 1 tablet (500 mg total) by mouth 2 (two) times daily. (Patient not taking: Reported on 04/28/2015) 20 tablet 0  . clonazePAM (KLONOPIN) 0.5 MG tablet Take 0.5 mg by mouth 2 (two) times daily.  0  . colchicine 0.6 MG tablet Take 0.6 mg by mouth. 2 tablets on onset of flare up, then 1 tablet an hour later and then 1 tablet daily until gout is gone    . escitalopram (LEXAPRO) 20 MG tablet Take 1 tablet (20 mg total) by mouth daily. (Patient taking differently: Take 40 mg by mouth daily. ) 90 tablet 3  . ezetimibe (ZETIA) 10 MG tablet Take 1 tablet (10 mg total) by mouth every morning. (Patient taking differently: Take 10 mg by mouth at bedtime. ) 90 tablet 3  . febuxostat (ULORIC) 40 MG tablet Take 1 tablet (40 mg total) by mouth daily. 30 tablet 5  . fluticasone (FLONASE) 50 MCG/ACT nasal spray Place 2 sprays into both nostrils daily. 48 g 3  . HYDROcodone-acetaminophen (NORCO/VICODIN) 5-325 MG tablet Take 1-2 tablets by mouth every 4 (four) hours as needed for moderate pain. (Patient not taking: Reported on 04/28/2015) 30 tablet 0  . omeprazole (PRILOSEC) 20 MG capsule Take 1 capsule (20 mg total) by mouth daily. (Patient taking differently: Take 20 mg by mouth at bedtime. )  90 capsule 3  . ondansetron (ZOFRAN ODT) 4 MG disintegrating tablet 4mg  ODT q4 hours prn nausea/vomit (Patient not taking: Reported on 04/28/2015) 4 tablet 0  . rOPINIRole (REQUIP) 3 MG tablet Take 1 tablet (3 mg total) by mouth at bedtime. 90 tablet 3  . traZODone (DESYREL) 100 MG tablet Take 50 mg by mouth daily as needed for sleep.      No current facility-administered medications on file prior to visit.   Allergies  Allergen Reactions  . Augmentin [Amoxicillin-Pot Clavulanate] Nausea And Vomiting  . Statins Other (See Comments)    Achiness, stiffness per pt  . Zocor [Simvastatin] Other (See Comments)     myalgia   Social History   Social History  . Marital Status: Married    Spouse Name: N/A  . Number of Children: N/A  . Years of Education: N/A   Occupational History  . Not on file.   Social History Main Topics  . Smoking status: Former Smoker -- 0.50 packs/day for 30 years    Types: Cigarettes    Quit date: 07/24/1989  . Smokeless tobacco: Never Used  . Alcohol Use: Yes     Comment: social  . Drug Use: No  . Sexual Activity: Not on file   Other Topics Concern  . 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      Objective:   Physical Exam  Constitutional: He appears well-developed and well-nourished. No distress.  Neck: Neck supple.  Cardiovascular: Normal rate, regular rhythm and normal heart sounds.   Pulmonary/Chest: Effort normal and breath sounds normal. No respiratory distress. He has no wheezes. He has no rales.  Abdominal: Soft. Bowel sounds are normal. He exhibits no distension. There is no tenderness. There is no rebound and no guarding.  Musculoskeletal: He exhibits no edema.  Lymphadenopathy:    He has no cervical adenopathy.  Skin: No rash noted. He is not diaphoretic.  Vitals reviewed.         Assessment & Plan:  Hospital discharge follow-up - Plan: CBC with Differential/Platelet, COMPLETE METABOLIC PANEL WITH GFR  There is no clinical signs of the patient has bacteremia or a persistent urinary tract infection. He is afebrile and asymptomatic. I have reviewed the hospital discharge summary. I will repeat a BMP to monitor his renal function. Baseline creatinine is between 2.1 and 2.2. I will also recheck a CBC to monitor to make sure that his anemia has resolved. This was likely dilutional  in the hospital.  Patient is due for Pneumovax 23 as well as a flu shot and he received them both today

## 2015-04-29 LAB — COMPLETE METABOLIC PANEL WITH GFR
ALT: 19 U/L (ref 9–46)
AST: 18 U/L (ref 10–35)
Albumin: 3.9 g/dL (ref 3.6–5.1)
Alkaline Phosphatase: 125 U/L — ABNORMAL HIGH (ref 40–115)
BUN: 27 mg/dL — AB (ref 7–25)
CHLORIDE: 104 mmol/L (ref 98–110)
CO2: 19 mmol/L — AB (ref 20–31)
Calcium: 8.5 mg/dL — ABNORMAL LOW (ref 8.6–10.3)
Creat: 2.15 mg/dL — ABNORMAL HIGH (ref 0.70–1.25)
GFR, EST NON AFRICAN AMERICAN: 31 mL/min — AB (ref >=60)
GFR, Est African American: 35 mL/min — ABNORMAL LOW (ref >=60)
GLUCOSE: 130 mg/dL — AB (ref 70–99)
POTASSIUM: 4.1 mmol/L (ref 3.5–5.3)
SODIUM: 136 mmol/L (ref 135–146)
Total Bilirubin: 0.3 mg/dL (ref 0.2–1.2)
Total Protein: 6.9 g/dL (ref 6.1–8.1)

## 2015-05-01 ENCOUNTER — Telehealth (HOSPITAL_COMMUNITY): Payer: Self-pay | Admitting: *Deleted

## 2015-05-02 ENCOUNTER — Encounter: Payer: Self-pay | Admitting: Family Medicine

## 2015-05-03 ENCOUNTER — Ambulatory Visit (HOSPITAL_COMMUNITY): Payer: Medicare Other | Attending: Cardiology

## 2015-05-03 ENCOUNTER — Other Ambulatory Visit: Payer: Self-pay | Admitting: Physician Assistant

## 2015-05-03 ENCOUNTER — Other Ambulatory Visit (HOSPITAL_COMMUNITY): Payer: Medicare Other

## 2015-05-03 ENCOUNTER — Encounter (HOSPITAL_COMMUNITY): Payer: Self-pay

## 2015-05-03 DIAGNOSIS — R0602 Shortness of breath: Secondary | ICD-10-CM | POA: Insufficient documentation

## 2015-05-03 DIAGNOSIS — E785 Hyperlipidemia, unspecified: Secondary | ICD-10-CM | POA: Insufficient documentation

## 2015-05-03 DIAGNOSIS — R0609 Other forms of dyspnea: Secondary | ICD-10-CM

## 2015-05-03 MED ORDER — DOBUTAMINE INFUSION FOR EP/ECHO/NUC (1000 MCG/ML)
30.0000 ug/kg/min | Freq: Once | INTRAVENOUS | Status: AC
  Administered 2015-05-03: 30 ug/kg/min via INTRAVENOUS

## 2015-05-03 NOTE — Progress Notes (Signed)
Patient presented for exercise stress echo. He couldn't get HR up to target. Dobutamine stress echo has been initiated.  Wyatt Mage, [place] 05/03/2015

## 2015-05-15 ENCOUNTER — Encounter: Payer: Self-pay | Admitting: Cardiovascular Disease

## 2015-05-15 ENCOUNTER — Ambulatory Visit (INDEPENDENT_AMBULATORY_CARE_PROVIDER_SITE_OTHER): Payer: Medicare Other | Admitting: Cardiovascular Disease

## 2015-05-15 VITALS — BP 106/68 | HR 68 | Ht 69.0 in | Wt 208.2 lb

## 2015-05-15 DIAGNOSIS — E785 Hyperlipidemia, unspecified: Secondary | ICD-10-CM

## 2015-05-15 DIAGNOSIS — R0602 Shortness of breath: Secondary | ICD-10-CM | POA: Diagnosis not present

## 2015-05-15 NOTE — Progress Notes (Signed)
Cardiology Office Note   Date:  05/15/2015   ID:  [name], DOB 1946-03-22, MRN [id number]  PCP:  [name], MD  Cardiologist:   Sharol Harness, MD   Chief Complaint  Patient presents with  . Follow-up     no chest pain, has shortness of breath, no edema, no pain in legs, occassional cramping in legs, no lightheadedness, no dizziness      History of Present Illness: [name] is a 69 y.o. male with CKD 3, invasive bladder cancer, and hyperlipidemia who presents for follow up on a stress test.  [name] was recently hospitalized with Escherichia coli bacteremia. He was treated with ciprofloxacin and his most recent blood culture on 10/6 were negative. He followed up with Dr. Dennard Schaumann on October 21 his white blood cell count was within normal range. [name] saw Rosaria Ferries in September, at which time he reported shortness of breath with exertion. He was referred for stress echo, which was negative for inducible ischemia.   [name] has noted shortness of breath especially when bending over for at least 1.5 years.  He notes it when walking up an incline as well.  He tries to walk three times per week for approximately 2 miles each time.  He denies shortness of breath on flat ground but does note it when walking up stairs.  He denies lower extremity edema, orthopnea, PND, palpitations, lightheadedness or dizziness.  He also notes coughing, especially at night.  He wonders if it could be asthma.  He reports having pulmonary function testing a couple years ago.  [name] uses Flonase for allergies and Prilosec for GERD  Past Medical History  Diagnosis Date  . Bladder rupture 2010  . Allergy   . Depression   . Hyperlipidemia   . Restless leg syndrome, controlled   . Bladder tumor   . Gout   . 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 Cancer ([place])     bladder  . Bacteremia due to Gram-negative bacteria ([place]) 04/13/2015    Past Surgical History    Procedure Laterality Date  . Appendectomy    . Nasal septum surgery    . Tonsillectomy and adenoidectomy    . Fracture surgery Right 01/05/2005    lower leg/ankle  . Bladder surgery  2010  . Transurethral resection of bladder tumor with gyrus (turbt-gyrus) N/A 04/21/2013    Procedure: TRANSURETHRAL RESECTION OF BLADDER TUMOR WITH GYRUS (TURBT-GYRUS), cystoscopy;  Surgeon: Molli Hazard, MD;  Location: [facility];  Service: Urology;  Laterality: N/A;  . Cystoscopy w/ retrogrades Bilateral 04/21/2013    Procedure: CYSTOSCOPY WITH RETROGRADE PYELOGRAM;  Surgeon: Molli Hazard, MD;  Location: [facility];  Service: Urology;  Laterality: Bilateral;     Current Outpatient Prescriptions  Medication Sig Dispense Refill  . acetaminophen (TYLENOL) 500 MG tablet Take 500 mg by mouth every 6 (six) hours as needed for moderate pain.     [name] aspirin 81 MG tablet Take 81 mg by mouth daily.    . clonazePAM (KLONOPIN) 0.5 MG tablet Take 0.5 mg by mouth 2 (two) times daily.  0  . colchicine 0.6 MG tablet Take 0.6 mg by mouth. 2 tablets on onset of flare up, then 1 tablet an hour later and then 1 tablet daily until gout is gone    . escitalopram (LEXAPRO) 20 MG tablet Take 20 mg by mouth 2 (two) times daily.    [name]  ezetimibe (ZETIA) 10 MG tablet Take 1 tablet (10 mg total) by mouth every morning. (Patient taking differently: Take 10 mg by mouth at bedtime. ) 90 tablet 3  . febuxostat (ULORIC) 40 MG tablet Take 1 tablet (40 mg total) by mouth daily. 30 tablet 5  . fluticasone (FLONASE) 50 MCG/ACT nasal spray Place 2 sprays into both nostrils daily. 48 g 3  . omeprazole (PRILOSEC) 20 MG capsule Take 1 capsule (20 mg total) by mouth daily. (Patient taking differently: Take 20 mg by mouth at bedtime. ) 90 capsule 3  . rOPINIRole (REQUIP) 3 MG tablet Take 1 tablet (3 mg total) by mouth at bedtime. 90 tablet 3  . traZODone (DESYREL) 100 MG tablet Take 50 mg by mouth daily as needed for sleep.      No current  facility-administered medications for this visit.    Allergies:   Augmentin; Statins; and Zocor    Social History:  The patient  reports that he quit smoking about 25 years ago. His smoking use included Cigarettes. He has a 15 pack-year smoking history. He has never used smokeless tobacco. He reports that he drinks alcohol. He reports that he does not use illicit drugs.   Family History:  The patient's family history includes Alcohol abuse in his father; Cancer in his mother; Cirrhosis in his father.    ROS:  Please see the history of present illness.   Otherwise, review of systems are positive for none.   All other systems are reviewed and negative.    PHYSICAL EXAM: VS:  BP 106/68 mmHg  Pulse 68  Ht 5\' 9"  (1.753 m)  Wt 94.462 kg (208 lb 4 oz)  BMI 30.74 kg/m2 , BMI Body mass index is 30.74 kg/(m^2). GENERAL:  Well appearing HEENT:  Pupils equal round and reactive, fundi not visualized, oral mucosa unremarkable NECK:  No jugular venous distention, waveform within normal limits, carotid upstroke brisk and symmetric, no bruits, no thyromegaly LYMPHATICS:  No cervical adenopathy LUNGS:  Clear to auscultation bilaterally HEART:  RRR.  PMI not displaced or sustained,S1 and S2 within normal limits, no S3, no S4, no clicks, no rubs, n murmurs ABD:  Flat, positive bowel sounds normal in frequency in pitch, no bruits, no rebound, no guarding, no midline pulsatile mass, no hepatomegaly, no splenomegaly EXT:  2 plus pulses throughout, no edema, no cyanosis no clubbing SKIN:  No rashes no nodules NEURO:  Cranial nerves II through XII grossly intact, motor grossly intact throughout PSYCH:  Cognitively intact, oriented to person place and time    EKG:  EKG is not ordered today.   Recent Labs: 04/28/2015: ALT 19; BUN 27*; Creat 2.15*; Hemoglobin 12.6*; Platelets 261; Potassium 4.1; Sodium 136    Lipid Panel    Component Value Date/Time   CHOL 153 06/16/2014 1125   TRIG 212* 06/16/2014  1125   HDL 35* 06/16/2014 1125   CHOLHDL 4.4 06/16/2014 1125   VLDL 42* 06/16/2014 1125   LDLCALC 76 06/16/2014 1125      Wt Readings from Last 3 Encounters:  05/15/15 94.462 kg (208 lb 4 oz)  04/28/15 95.255 kg (210 lb)  04/15/15 91.4 kg (201 lb 8 oz)      ASSESSMENT AND PLAN:  # Shortness of breath: Stress test was normal.  He has no signs of heart failure on exam.  [name] has been hospitalized several times in recent months and has been quite ill.  There are no indications that his shortness of breath  is from ischemic heart disease or heart failure.  I have encouraged him to try increasing his physical exercise slowly, as his symptoms may be due to deconditioning.  # Nocturnal cough: [name] thinks that his seasonal allergies are well-controlled with flonase.  I recommended that he increase his omeprazole to 20 mg bid for 2 weeks to see if this may help.  Poorly-controlled GERD could be causing his cough.  # Hyperlipidemia: [name] has been intolerant of statins in the past.  He is currently on Zetia and his lipids are followed by his PCP.  Current medicines are reviewed at length with the patient today.  The patient does not have concerns regarding medicines.  The following changes have been made:  no change  Labs/ tests ordered today include:  No orders of the defined types were placed in this encounter.     Disposition:   FU with Navah Grondin C. Oval Linsey, MD as needed    Signed, Sharol Harness, MD  05/15/2015 8:38 AM    [place]

## 2015-05-15 NOTE — Patient Instructions (Signed)
No changes with current treatment.  Your physician recommends that you schedule a follow-up appointment as needed.

## 2015-05-17 ENCOUNTER — Other Ambulatory Visit: Payer: Self-pay | Admitting: Physician Assistant

## 2015-05-17 DIAGNOSIS — Z8546 Personal history of malignant neoplasm of prostate: Secondary | ICD-10-CM | POA: Diagnosis not present

## 2015-05-17 DIAGNOSIS — Z905 Acquired absence of kidney: Secondary | ICD-10-CM | POA: Diagnosis not present

## 2015-05-17 DIAGNOSIS — Z8551 Personal history of malignant neoplasm of bladder: Secondary | ICD-10-CM | POA: Diagnosis not present

## 2015-05-17 DIAGNOSIS — C679 Malignant neoplasm of bladder, unspecified: Secondary | ICD-10-CM | POA: Diagnosis not present

## 2015-05-17 DIAGNOSIS — N189 Chronic kidney disease, unspecified: Secondary | ICD-10-CM | POA: Diagnosis not present

## 2015-05-17 DIAGNOSIS — R3129 Other microscopic hematuria: Secondary | ICD-10-CM | POA: Diagnosis not present

## 2015-05-18 NOTE — Telephone Encounter (Signed)
Refill appropriate and filled per protocol. 

## 2015-05-24 ENCOUNTER — Telehealth: Payer: Self-pay | Admitting: Family Medicine

## 2015-05-24 NOTE — Telephone Encounter (Signed)
PA form sent for completion for [employer]  From [facility]  Ref# [id number].  Form completed and returned to [employer]

## 2015-05-29 NOTE — Telephone Encounter (Signed)
PA approved for [employer] thru 07/07/16.  [id number]

## 2015-06-13 ENCOUNTER — Ambulatory Visit (HOSPITAL_BASED_OUTPATIENT_CLINIC_OR_DEPARTMENT_OTHER): Payer: Medicare Other | Admitting: Oncology

## 2015-06-13 ENCOUNTER — Other Ambulatory Visit (HOSPITAL_BASED_OUTPATIENT_CLINIC_OR_DEPARTMENT_OTHER): Payer: Medicare Other

## 2015-06-13 ENCOUNTER — Telehealth: Payer: Self-pay | Admitting: Oncology

## 2015-06-13 VITALS — BP 132/66 | HR 72 | Temp 98.0°F | Resp 18 | Ht 69.0 in | Wt 208.6 lb

## 2015-06-13 DIAGNOSIS — N289 Disorder of kidney and ureter, unspecified: Secondary | ICD-10-CM

## 2015-06-13 DIAGNOSIS — C679 Malignant neoplasm of bladder, unspecified: Secondary | ICD-10-CM

## 2015-06-13 LAB — COMPREHENSIVE METABOLIC PANEL
ALT: 19 U/L (ref 0–55)
ANION GAP: 10 meq/L (ref 3–11)
AST: 21 U/L (ref 5–34)
Albumin: 3.7 g/dL (ref 3.5–5.0)
Alkaline Phosphatase: 145 U/L (ref 40–150)
BUN: 29.3 mg/dL — ABNORMAL HIGH (ref 7.0–26.0)
CALCIUM: 8.8 mg/dL (ref 8.4–10.4)
CHLORIDE: 109 meq/L (ref 98–109)
CO2: 18 meq/L — AB (ref 22–29)
CREATININE: 2.2 mg/dL — AB (ref 0.7–1.3)
EGFR: 30 mL/min/{1.73_m2} — AB (ref >90)
Glucose: 142 mg/dl — ABNORMAL HIGH (ref 70–140)
POTASSIUM: 4.2 meq/L (ref 3.5–5.1)
Sodium: 137 mEq/L (ref 136–145)
Total Bilirubin: 0.41 mg/dL (ref 0.20–1.20)
Total Protein: 7.6 g/dL (ref 6.4–8.3)

## 2015-06-13 LAB — CBC WITH DIFFERENTIAL/PLATELET
BASO%: 0.8 % (ref 0.0–2.0)
BASOS ABS: 0 10*3/uL (ref 0.0–0.1)
EOS ABS: 0.1 10*3/uL (ref 0.0–0.5)
EOS%: 1.3 % (ref 0.0–7.0)
HEMATOCRIT: 41.4 % (ref 38.4–49.9)
HGB: 13.8 g/dL (ref 13.0–17.1)
LYMPH#: 1.3 10*3/uL (ref 0.9–3.3)
LYMPH%: 22 % (ref 14.0–49.0)
MCH: 29.9 pg (ref 27.2–33.4)
MCHC: 33.3 g/dL (ref 32.0–36.0)
MCV: 89.9 fL (ref 79.3–98.0)
MONO#: 0.4 10*3/uL (ref 0.1–0.9)
MONO%: 6.3 % (ref 0.0–14.0)
NEUT#: 4.1 10*3/uL (ref 1.5–6.5)
NEUT%: 69.6 % (ref 39.0–75.0)
PLATELETS: 213 10*3/uL (ref 140–400)
RBC: 4.61 10*6/uL (ref 4.20–5.82)
RDW: 14.2 % (ref 11.0–14.6)
WBC: 5.8 10*3/uL (ref 4.0–10.3)

## 2015-06-13 NOTE — Progress Notes (Signed)
Hematology and Oncology Follow Up Visit  NABOR NEESON [id number] 25-Dec-1945 69 y.o. 06/13/2015 10:29 AM [name],[name], MDPickard, [name], MD   Principle Diagnosis: 69 year old gentleman with muscle invasive bladder cancer diagnosed in October of 2014 presented with a T2 N0 disease.  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 He is S/P neoadjuvant chemotherapy with cisplatinum and Gemzar started on 06/11/2013. He is status post 3 cycle of chemotherapy completed in January of 2015. He underwent a cystectomy on 10/05/2013 at [facility]. The pathology revealed no residual tumor with 12 lymph nodes sampled noninvolved with malignancy. 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 The pathology did not reveal any malignancy of his left kidney.  Current therapy:  Observation and surveillance.  Interim History: Mr. Lorenz presents today for a followup visit by himself. Since his last visit, he was hospitalized briefly in October 2016 for Escherichia coli bacteremia likely from a urinary source. He has recovered very well and have been in excellent health and shape since that time. He denied any hematuria, fevers or chills. He continues to perform activities of daily living without any decline.   He does not report any headaches, blurry vision, syncope or seizures. He has not reported any nausea or vomiting or abdominal pain. Has not reported any genitourinary complaints. He denies any chest pain shortness of breath cough or hemoptysis. He denies any any frequency urgency or hesitancy. Does not report any skeletal complaints. Is not reporting rashes or lesions. Rest of his review of systems unremarkable.  Medications: I have reviewed the patient's current medications.  Current Outpatient Prescriptions  Medication Sig Dispense Refill  . acetaminophen (TYLENOL) 500 MG tablet  Take 500 mg by mouth every 6 (six) hours as needed for moderate pain.     [name] aspirin 81 MG tablet Take 81 mg by mouth daily.    . clonazePAM (KLONOPIN) 0.5 MG tablet Take 0.5 mg by mouth 2 (two) times daily.  0  . colchicine 0.6 MG tablet Take 0.6 mg by mouth. 2 tablets on onset of flare up, then 1 tablet an hour later and then 1 tablet daily until gout is gone    . escitalopram (LEXAPRO) 20 MG tablet Take 1 tablet by mouth  daily 90 tablet 0  . febuxostat (ULORIC) 40 MG tablet Take 1 tablet (40 mg total) by mouth daily. 30 tablet 5  . fluticasone (FLONASE) 50 MCG/ACT nasal spray Use 2 sprays in each  nostril daily 48 g 3  . omeprazole (PRILOSEC) 20 MG capsule Take 1 capsule by mouth  daily 90 capsule 3  . rOPINIRole (REQUIP) 3 MG tablet Take 1 tablet (3 mg total) by mouth at bedtime. 90 tablet 3  . traZODone (DESYREL) 100 MG tablet Take 50 mg by mouth daily as needed for sleep.     [name] ZETIA 10 MG tablet Take 1 tablet by mouth  every morning 90 tablet 0   No current facility-administered medications for this visit.     Allergies:  Allergies  Allergen Reactions  . Augmentin [Amoxicillin-Pot Clavulanate] Nausea And Vomiting  . Statins Other (See Comments)    Achiness, stiffness per pt  . Zocor [Simvastatin] Other (See Comments)    myalgia    Past Medical History, Surgical history, Social history, and Family History were reviewed and updated.    Physical Exam: Blood pressure 132/66, pulse 72, temperature 98 F (36.7  C), temperature source Oral, resp. rate 18, height 5\' 9"  (1.753 m), weight 208 lb 9.6 oz (94.62 kg), SpO2 100 %. ECOG: 0 General appearance: alert away gentleman well-appearing without distress. Head: Normocephalic, without obvious abnormality, atraumatic no oral ulcers or lesions. Neck: no adenopathy Lymph nodes: Cervical, supraclavicular, and axillary nodes normal. Heart:regular rate and rhythm, S1, S2 normal, no murmur, click, rub or gallop Lung:chest clear, no  wheezing, rales, normal symmetric air entry no dullness to percussion. Abdomen: soft, non-tender, without masses or organomegaly no shifting dullness. EXT:no erythema, induration, or nodules   Lab Results: Lab Results  Component Value Date   WBC 5.8 06/13/2015   HGB 13.8 06/13/2015   HCT 41.4 06/13/2015   MCV 89.9 06/13/2015   PLT 213 06/13/2015     Chemistry      Component Value Date/Time   NA 136 04/28/2015 1443   NA 141 12/02/2014 1023   K 4.1 04/28/2015 1443   K 4.7 12/02/2014 1023   CL 104 04/28/2015 1443   CO2 19* 04/28/2015 1443   CO2 21* 12/02/2014 1023   BUN 27* 04/28/2015 1443   BUN 25.7 12/02/2014 1023   CREATININE 2.15* 04/28/2015 1443   CREATININE 2.16* 04/15/2015 0400   CREATININE 2.4* 12/02/2014 1023      Component Value Date/Time   CALCIUM 8.5* 04/28/2015 1443   CALCIUM 8.3* 12/02/2014 1023   ALKPHOS 125* 04/28/2015 1443   ALKPHOS 147 12/02/2014 1023   AST 18 04/28/2015 1443   AST 21 12/02/2014 1023   ALT 19 04/28/2015 1443   ALT 25 12/02/2014 1023   BILITOT 0.3 04/28/2015 1443   BILITOT 0.32 12/02/2014 1023      last CT scan done on 11/23/2014 at [facility] was reviewed today. He is status post left nephrectomy without any evidence of metastatic disease.    Impression and Plan:  69 year old with the following issues:  1. T2 N0 muscle invasive bladder cancer diagnosed in October of 2014 after he presented with hematuria. He underwent a radical cystectomy on 10/05/2013 without any complications. His pathology did not reveal any residual disease at this time I see no indication for any further chemotherapy or adjuvant radiation therapy.   CT scan in November 2016 was reviewed and showed no evidence of recurrent disease. The plan is to continue with observation surveillance and follow-up in 6 months. He will continue to have imaging studies done periodically and the care of Dr. Jeffie Pollock.  2. Renal insufficiency: he is status post nephrectomy in  January 2016 for recurrent hydronephrosis and pyelonephritis. Creatinine is close to baseline around 2.2.  3. IV access: His port has been removed due to possible infection. No further flushes will be required.  4. Followup: In 6 months for a clinical visit.    Mahamed Zalewski 12/6/201610:29 AM

## 2015-06-13 NOTE — Telephone Encounter (Signed)
Gave and printed appt sched and avs for pt for JUne 2017

## 2015-06-13 NOTE — Addendum Note (Signed)
Addended by: Randolm Idol on: 06/13/2015 10:51 AM   Modules accepted: Orders, Medications

## 2015-08-04 ENCOUNTER — Other Ambulatory Visit: Payer: Self-pay | Admitting: Family Medicine

## 2015-08-04 ENCOUNTER — Other Ambulatory Visit: Payer: Self-pay | Admitting: Physician Assistant

## 2015-08-07 NOTE — Telephone Encounter (Signed)
Medication refilled per protocol. 

## 2015-09-08 ENCOUNTER — Ambulatory Visit (INDEPENDENT_AMBULATORY_CARE_PROVIDER_SITE_OTHER): Payer: Medicare Other | Admitting: Family Medicine

## 2015-09-08 ENCOUNTER — Encounter: Payer: Self-pay | Admitting: Family Medicine

## 2015-09-08 VITALS — BP 100/68 | HR 74 | Temp 97.4°F | Resp 16 | Ht 68.5 in | Wt 208.0 lb

## 2015-09-08 DIAGNOSIS — R8299 Other abnormal findings in urine: Secondary | ICD-10-CM | POA: Diagnosis not present

## 2015-09-08 DIAGNOSIS — N390 Urinary tract infection, site not specified: Secondary | ICD-10-CM

## 2015-09-08 DIAGNOSIS — R8290 Unspecified abnormal findings in urine: Secondary | ICD-10-CM | POA: Diagnosis not present

## 2015-09-08 LAB — URINALYSIS, ROUTINE W REFLEX MICROSCOPIC
BILIRUBIN URINE: NEGATIVE
GLUCOSE, UA: NEGATIVE
Ketones, ur: NEGATIVE
Nitrite: POSITIVE — AB
PH: 6 (ref 5.0–8.0)
PROTEIN: NEGATIVE
SPECIFIC GRAVITY, URINE: 1.015 (ref 1.001–1.035)

## 2015-09-08 LAB — URINALYSIS, MICROSCOPIC ONLY
CASTS: NONE SEEN [LPF]
CRYSTALS: NONE SEEN [HPF]
YEAST: NONE SEEN [HPF]

## 2015-09-08 MED ORDER — CIPROFLOXACIN HCL 500 MG PO TABS: 500.0000 mg | ORAL_TABLET | Freq: Two times a day (BID) | ORAL | Status: DC

## 2015-09-08 NOTE — Progress Notes (Signed)
Subjective:    Patient ID: [name], male    DOB: 07-25-1945, 70 y.o.   MRN: [id number]  HPI  a she has a complicated past medical history including bladder cancer. Patient has underwent removal of his bladder along with his prostate and the left kidney. He now has a urostomy bag.   Last year patient was hospitalized with pyelonephritis and sepsis with Escherichia coli bacteremia stemming from pyelonephritis. He is experiencing symptoms to what he experienced last year. He is experiencing foul-smelling urine for the last 4 days. He has been drinking copious amounts of water without any improvement. He states that the smell "can gag you."   He denies that it is just a strong smell of concentrated urine. He denies any fevers or chills or nausea or vomiting. He denies any right-sided back pain. The urostomy site appears healthy and pink with no evidence of a superinfection. However her urine sample today on urinalysis shows +1 leukocyte Estrace and +2 blood. Past Medical History  Diagnosis Date  . Bladder rupture 2010  . Allergy   . Depression   . Hyperlipidemia   . Restless leg syndrome, controlled   . Bladder tumor   . Gout   . 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 Cancer ([place])     bladder  . Bacteremia due to Gram-negative bacteria ([place]) 04/13/2015   Past Surgical History  Procedure Laterality Date  . Appendectomy    . Nasal septum surgery    . Tonsillectomy and adenoidectomy    . Fracture surgery Right 01/05/2005    lower leg/ankle  . Bladder surgery  2010  . Transurethral resection of bladder tumor with gyrus (turbt-gyrus) N/A 04/21/2013    Procedure: TRANSURETHRAL RESECTION OF BLADDER TUMOR WITH GYRUS (TURBT-GYRUS), cystoscopy;  Surgeon: Molli Hazard, MD;  Location: [facility];  Service: Urology;  Laterality: N/A;  . Cystoscopy w/ retrogrades Bilateral 04/21/2013    Procedure: CYSTOSCOPY WITH RETROGRADE PYELOGRAM;  Surgeon: Molli Hazard, MD;   Location: [facility];  Service: Urology;  Laterality: Bilateral;   Current Outpatient Prescriptions on File Prior to Visit  Medication Sig Dispense Refill  . acetaminophen (TYLENOL) 500 MG tablet Take 500 mg by mouth every 6 (six) hours as needed for moderate pain.     [name] aspirin 81 MG tablet Take 81 mg by mouth daily.    . clonazePAM (KLONOPIN) 0.5 MG tablet Take 0.5 mg by mouth 2 (two) times daily.  0  . colchicine 0.6 MG tablet Take 0.6 mg by mouth. 2 tablets on onset of flare up, then 1 tablet an hour later and then 1 tablet daily until gout is gone    . escitalopram (LEXAPRO) 20 MG tablet Take 40 mg by mouth daily.    . febuxostat (ULORIC) 40 MG tablet Take 1 tablet (40 mg total) by mouth daily. 30 tablet 5  . fluticasone (FLONASE) 50 MCG/ACT nasal spray Use 2 sprays in each  nostril daily 48 g 3  . omeprazole (PRILOSEC) 20 MG capsule Take 1 capsule by mouth  daily 90 capsule 3  . rOPINIRole (REQUIP) 3 MG tablet Take 1 tablet by mouth at  bedtime 90 tablet 1  . traZODone (DESYREL) 100 MG tablet Take 50 mg by mouth daily as needed for sleep.     [name] ZETIA 10 MG tablet Take 1 tablet by mouth  every morning 90 tablet 1   No current facility-administered medications on file prior to visit.  Allergies  Allergen Reactions  . Augmentin [Amoxicillin-Pot Clavulanate] Nausea And Vomiting  . Statins Other (See Comments)    Achiness, stiffness per pt  . Zocor [Simvastatin] Other (See Comments)    myalgia   Social History   Social History  . Marital Status: Married    Spouse Name: N/A  . Number of Children: N/A  . Years of Education: N/A   Occupational History  . Not on file.   Social History Main Topics  . Smoking status: Former Smoker -- 0.50 packs/day for 30 years    Types: Cigarettes    Quit date: 07/24/1989  . Smokeless tobacco: Never Used  . Alcohol Use: Yes     Comment: social  . Drug Use: No  . Sexual Activity: Not on file   Other Topics Concern  . 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      Objective:   Physical Exam  Cardiovascular: Normal rate, regular rhythm and normal heart sounds.   Pulmonary/Chest: Effort normal and breath sounds normal. No respiratory distress. He has no wheezes.  Abdominal: Soft. Bowel sounds are normal. He exhibits no distension. There is no tenderness. There is no rebound.  Vitals reviewed. no cvat.        Assessment & Plan:  Bad odor of urine - Plan: Urinalysis, Routine w reflex microscopic (not at [facility]), ciprofloxacin (CIPRO) [id number] MG tablet  Complicated UTI (urinary tract infection) - Plan: ciprofloxacin (CIPRO) 500 MG tablet   Patient's situation is complicated in that he does not have typical symptoms of a bladder infection. Furthermore he is at high risk for an ascending infection which would quickly be by definition pyelonephritis. Given the foul smell that he never experiences and also the abnormal urinalysis, I will put the patient on Cipro 250 mg by mouth twice a day for 7 days while awaiting a urine culture.

## 2015-09-08 NOTE — Addendum Note (Signed)
Addended by: Shary Decamp B on: 09/08/2015 04:22 PM   Modules accepted: Orders

## 2015-09-10 LAB — URINE CULTURE

## 2015-09-18 DIAGNOSIS — F332 Major depressive disorder, recurrent severe without psychotic features: Secondary | ICD-10-CM | POA: Diagnosis not present

## 2015-09-20 ENCOUNTER — Other Ambulatory Visit: Payer: Self-pay | Admitting: Physician Assistant

## 2015-09-20 NOTE — Telephone Encounter (Signed)
Medication refilled per protocol. 

## 2015-11-17 ENCOUNTER — Ambulatory Visit (HOSPITAL_COMMUNITY)
Admission: RE | Admit: 2015-11-17 | Discharge: 2015-11-17 | Disposition: A | Discharge status: Home / Self Care | Payer: Medicare Other | Source: Ambulatory Visit | Attending: Urology | Admitting: Urology

## 2015-11-17 ENCOUNTER — Other Ambulatory Visit: Payer: Self-pay | Admitting: Urology

## 2015-11-17 DIAGNOSIS — N189 Chronic kidney disease, unspecified: Secondary | ICD-10-CM | POA: Diagnosis not present

## 2015-11-17 DIAGNOSIS — R918 Other nonspecific abnormal finding of lung field: Secondary | ICD-10-CM | POA: Diagnosis not present

## 2015-11-17 DIAGNOSIS — Z8551 Personal history of malignant neoplasm of bladder: Secondary | ICD-10-CM | POA: Diagnosis not present

## 2015-11-17 DIAGNOSIS — Z8546 Personal history of malignant neoplasm of prostate: Secondary | ICD-10-CM | POA: Diagnosis not present

## 2015-11-17 DIAGNOSIS — N2889 Other specified disorders of kidney and ureter: Secondary | ICD-10-CM | POA: Diagnosis not present

## 2015-11-27 DIAGNOSIS — Z8546 Personal history of malignant neoplasm of prostate: Secondary | ICD-10-CM | POA: Diagnosis not present

## 2015-11-27 DIAGNOSIS — Z8551 Personal history of malignant neoplasm of bladder: Secondary | ICD-10-CM | POA: Diagnosis not present

## 2015-11-27 DIAGNOSIS — N189 Chronic kidney disease, unspecified: Secondary | ICD-10-CM | POA: Diagnosis not present

## 2015-11-30 DIAGNOSIS — D224 Melanocytic nevi of scalp and neck: Secondary | ICD-10-CM | POA: Diagnosis not present

## 2015-11-30 DIAGNOSIS — L821 Other seborrheic keratosis: Secondary | ICD-10-CM | POA: Diagnosis not present

## 2015-11-30 DIAGNOSIS — D225 Melanocytic nevi of trunk: Secondary | ICD-10-CM | POA: Diagnosis not present

## 2015-11-30 DIAGNOSIS — B078 Other viral warts: Secondary | ICD-10-CM | POA: Diagnosis not present

## 2015-11-30 DIAGNOSIS — D2272 Melanocytic nevi of left lower limb, including hip: Secondary | ICD-10-CM | POA: Diagnosis not present

## 2015-11-30 DIAGNOSIS — D1801 Hemangioma of skin and subcutaneous tissue: Secondary | ICD-10-CM | POA: Diagnosis not present

## 2015-11-30 DIAGNOSIS — L918 Other hypertrophic disorders of the skin: Secondary | ICD-10-CM | POA: Diagnosis not present

## 2015-12-07 ENCOUNTER — Encounter: Payer: Self-pay | Admitting: *Deleted

## 2015-12-26 ENCOUNTER — Ambulatory Visit (HOSPITAL_BASED_OUTPATIENT_CLINIC_OR_DEPARTMENT_OTHER): Payer: Medicare Other | Admitting: Oncology

## 2015-12-26 ENCOUNTER — Other Ambulatory Visit: Payer: Self-pay | Admitting: Oncology

## 2015-12-26 ENCOUNTER — Other Ambulatory Visit (HOSPITAL_BASED_OUTPATIENT_CLINIC_OR_DEPARTMENT_OTHER): Payer: Medicare Other

## 2015-12-26 ENCOUNTER — Telehealth: Payer: Self-pay | Admitting: Oncology

## 2015-12-26 VITALS — BP 103/58 | HR 64 | Temp 97.8°F | Resp 17 | Ht 68.5 in | Wt 208.6 lb

## 2015-12-26 DIAGNOSIS — C679 Malignant neoplasm of bladder, unspecified: Secondary | ICD-10-CM

## 2015-12-26 DIAGNOSIS — N289 Disorder of kidney and ureter, unspecified: Secondary | ICD-10-CM | POA: Diagnosis not present

## 2015-12-26 LAB — CBC WITH DIFFERENTIAL/PLATELET
BASO%: 0.5 % (ref 0.0–2.0)
BASOS ABS: 0 10*3/uL (ref 0.0–0.1)
EOS ABS: 0.1 10*3/uL (ref 0.0–0.5)
EOS%: 2.1 % (ref 0.0–7.0)
HCT: 40.9 % (ref 38.4–49.9)
HEMOGLOBIN: 13.4 g/dL (ref 13.0–17.1)
LYMPH%: 21.5 % (ref 14.0–49.0)
MCH: 29.6 pg (ref 27.2–33.4)
MCHC: 32.8 g/dL (ref 32.0–36.0)
MCV: 90.5 fL (ref 79.3–98.0)
MONO#: 0.5 10*3/uL (ref 0.1–0.9)
MONO%: 8 % (ref 0.0–14.0)
NEUT#: 3.8 10*3/uL (ref 1.5–6.5)
NEUT%: 67.9 % (ref 39.0–75.0)
Platelets: 185 10*3/uL (ref 140–400)
RBC: 4.52 10*6/uL (ref 4.20–5.82)
RDW: 14 % (ref 11.0–14.6)
WBC: 5.6 10*3/uL (ref 4.0–10.3)
lymph#: 1.2 10*3/uL (ref 0.9–3.3)

## 2015-12-26 LAB — COMPREHENSIVE METABOLIC PANEL
ALBUMIN: 3.5 g/dL (ref 3.5–5.0)
ALK PHOS: 124 U/L (ref 40–150)
ALT: 17 U/L (ref 0–55)
ANION GAP: 7 meq/L (ref 3–11)
AST: 16 U/L (ref 5–34)
BILIRUBIN TOTAL: 0.34 mg/dL (ref 0.20–1.20)
BUN: 22.4 mg/dL (ref 7.0–26.0)
CALCIUM: 9.1 mg/dL (ref 8.4–10.4)
CO2: 23 mEq/L (ref 22–29)
Chloride: 108 mEq/L (ref 98–109)
Creatinine: 2.3 mg/dL — ABNORMAL HIGH (ref 0.7–1.3)
EGFR: 28 mL/min/{1.73_m2} — AB (ref >90)
Glucose: 194 mg/dl — ABNORMAL HIGH (ref 70–140)
POTASSIUM: 4.8 meq/L (ref 3.5–5.1)
SODIUM: 138 meq/L (ref 136–145)
TOTAL PROTEIN: 7.2 g/dL (ref 6.4–8.3)

## 2015-12-26 NOTE — Progress Notes (Signed)
Hematology and Oncology Follow Up Visit  [name] [id number] 02-22-1946 70 y.o. 12/26/2015 11:19 AM [name],[name], MDPickard, [name], MD   Principle Diagnosis: 70 year old gentleman with muscle invasive bladder cancer diagnosed in October of 2014 presented with a T2 N0 disease.  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 He is S/P neoadjuvant chemotherapy with cisplatinum and Gemzar started on 06/11/2013. He is status post 3 cycle of chemotherapy completed in January of 2015. He underwent a cystectomy on 10/05/2013 at [facility]. The pathology revealed no residual tumor with 12 lymph nodes sampled noninvolved with malignancy. 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 The pathology did not reveal any malignancy of his left kidney.  Current therapy:  Observation and surveillance.  Interim History: [name] presents today for a followup visit by himself. Since his last visit, he reports no recent complaints. He continues to do very well without any signs or symptoms of cancer recurrence. He denied any hematuria, fevers or chills. He continues to perform activities of daily living without any decline. He has retired from his job at this time and had been traveling and working out regularly. His appetite is excellent and does not report any changes.   He does not report any headaches, blurry vision, syncope or seizures. He has not reported any nausea or vomiting or abdominal pain. Has not reported any genitourinary complaints. He denies any chest pain shortness of breath cough or hemoptysis. He denies any any frequency urgency or hesitancy. Does not report any skeletal complaints. Is not reporting rashes or lesions. Rest of his review of systems unremarkable.  Medications: I have reviewed the patient's current medications.  Current Outpatient Prescriptions   Medication Sig Dispense Refill  . acetaminophen (TYLENOL) 500 MG tablet Take 500 mg by mouth every 6 (six) hours as needed for moderate pain.     [name] aspirin 81 MG tablet Take 81 mg by mouth daily.    . clonazePAM (KLONOPIN) 0.5 MG tablet Take 0.5 mg by mouth 2 (two) times daily.  0  . colchicine 0.6 MG tablet Take 0.6 mg by mouth. 2 tablets on onset of flare up, then 1 tablet an hour later and then 1 tablet daily until gout is gone    . escitalopram (LEXAPRO) 20 MG tablet Take 40 mg by mouth daily.    . fluticasone (FLONASE) 50 MCG/ACT nasal spray Use 2 sprays in each  nostril daily 48 g 3  . omeprazole (PRILOSEC) 20 MG capsule Take 1 capsule by mouth  daily 90 capsule 3  . rOPINIRole (REQUIP) 3 MG tablet Take 1 tablet by mouth at  bedtime 90 tablet 1  . traZODone (DESYREL) 100 MG tablet Take 50 mg by mouth daily as needed for sleep.     [name] ULORIC 40 MG tablet Take 1 tablet by mouth  daily 90 tablet 1  . ZETIA 10 MG tablet Take 1 tablet by mouth  every morning 90 tablet 1   No current facility-administered medications for this visit.     Allergies:  Allergies  Allergen Reactions  . Augmentin [Amoxicillin-Pot Clavulanate] Nausea And Vomiting  . Statins Other (See Comments)    Achiness, stiffness per pt  . Zocor [Simvastatin] Other (See Comments)    myalgia    Past Medical History, Surgical history, Social history, and Family History were reviewed and updated.    Physical Exam: Blood pressure  103/58, pulse 64, temperature 97.8 F (36.6 C), temperature source Oral, resp. rate 17, height 5' 8.5" (1.74 m), weight 208 lb 9.6 oz (94.62 kg), SpO2 98 %. ECOG: 0 General appearance: Pleasant-appearing gentleman without distress. Head: Sclera are anicteric. His atraumatic. No oral ulcers or lesions. Neck: no adenopathy Lymph nodes: Cervical, supraclavicular, and axillary nodes normal. Heart:regular rate and rhythm, S1, S2 normal, no murmur, click, rub or gallop Lung:chest clear, no  wheezing, rales, normal symmetric air entry no dullness to percussion. Abdomen: soft, non-tender, without masses or organomegaly no rebound or guarding. EXT:no erythema, induration, or nodules   Lab Results: Lab Results  Component Value Date   WBC 5.6 12/26/2015   HGB 13.4 12/26/2015   HCT 40.9 12/26/2015   MCV 90.5 12/26/2015   PLT 185 12/26/2015     Chemistry      Component Value Date/Time   NA 137 06/13/2015 1006   NA 136 04/28/2015 1443   K 4.2 06/13/2015 1006   K 4.1 04/28/2015 1443   CL 104 04/28/2015 1443   CO2 18* 06/13/2015 1006   CO2 19* 04/28/2015 1443   BUN 29.3* 06/13/2015 1006   BUN 27* 04/28/2015 1443   CREATININE 2.2* 06/13/2015 1006   CREATININE 2.15* 04/28/2015 1443   CREATININE 2.16* 04/15/2015 0400      Component Value Date/Time   CALCIUM 8.8 06/13/2015 1006   CALCIUM 8.5* 04/28/2015 1443   ALKPHOS 145 06/13/2015 1006   ALKPHOS 125* 04/28/2015 1443   AST 21 06/13/2015 1006   AST 18 04/28/2015 1443   ALT 19 06/13/2015 1006   ALT 19 04/28/2015 1443   BILITOT 0.41 06/13/2015 1006   BILITOT 0.3 04/28/2015 1443        Impression and Plan:  11- year old with the following issues:  1. T2 N0 muscle invasive bladder cancer diagnosed in October of 2014 after he presented with hematuria. He underwent a radical cystectomy on 10/05/2013 without any complications. His pathology did not reveal any residual disease at this time I see no indication for any further chemotherapy or adjuvant radiation therapy.   CT scan in May 2017 as well as a chest x-ray was reviewed today and showed no evidence of recurrent disease. Plan is to continue with active surveillance with clinical visits every 6 months. He continues to have imaging studies done by Dr. Jeffie Pollock for surveillance purposes.  2. Renal insufficiency: he is status post nephrectomy in January 2016 for recurrent hydronephrosis and pyelonephritis. Creatinine is close to baseline around 2.2.  3. Followup: In 6  months for a clinical visit.    [name] 6/20/201711:19 AM

## 2015-12-26 NOTE — Telephone Encounter (Signed)
per pof to sch pt appt-gave pt copy of avs °

## 2016-01-01 ENCOUNTER — Ambulatory Visit (INDEPENDENT_AMBULATORY_CARE_PROVIDER_SITE_OTHER): Payer: Medicare Other | Admitting: Physician Assistant

## 2016-01-01 VITALS — BP 114/62 | HR 68 | Temp 98.6°F | Resp 26 | Ht 68.5 in | Wt 208.0 lb

## 2016-01-01 DIAGNOSIS — R300 Dysuria: Secondary | ICD-10-CM | POA: Diagnosis not present

## 2016-01-01 DIAGNOSIS — N390 Urinary tract infection, site not specified: Secondary | ICD-10-CM

## 2016-01-01 DIAGNOSIS — C679 Malignant neoplasm of bladder, unspecified: Secondary | ICD-10-CM | POA: Diagnosis not present

## 2016-01-01 LAB — URINALYSIS, MICROSCOPIC ONLY
Casts: NONE SEEN [LPF]
Crystals: NONE SEEN [HPF]
SQUAMOUS EPITHELIAL / LPF: NONE SEEN [HPF] (ref <=5)
YEAST: NONE SEEN [HPF]

## 2016-01-01 LAB — URINALYSIS, ROUTINE W REFLEX MICROSCOPIC
Bilirubin Urine: NEGATIVE
GLUCOSE, UA: NEGATIVE
Ketones, ur: NEGATIVE
Nitrite: NEGATIVE
Specific Gravity, Urine: 1.01 (ref 1.001–1.035)
pH: 6 (ref 5.0–8.0)

## 2016-01-01 MED ORDER — CIPROFLOXACIN HCL 500 MG PO TABS: 500.0000 mg | ORAL_TABLET | Freq: Two times a day (BID) | ORAL | Status: DC

## 2016-01-01 NOTE — Progress Notes (Signed)
Patient ID: [name] MRN: [id number], DOB: June 28, 1946, 70 y.o. Date of Encounter: @DATE @  Chief Complaint:  Chief Complaint  Patient presents with  . Back Pain    Lower back pain   . foul urine odor    HPI: 70 y.o. year old white male  presents with above.   Has h/o bladder cancer. Has had surgery to remove bladder, prostate, and left kidney in past. He has a urostomy bag.  In past has been hospitalized with pyelonephritis and sepsis.  He has noticed foul odor to urine so came in for eval.  Also some pain in right low back. Points to low back at approx L5 level as area of right back pain. No pain at area of costophrenic angle.   Has had no fever.   B/C of ostomy bag--unable to detect usual uti symptoms of dysuria, frequency, urgency.    Past Medical History  Diagnosis Date  . Bladder rupture 2010  . Allergy   . Depression   . Hyperlipidemia   . Restless leg syndrome, controlled   . Bladder tumor   . Gout   . 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 Cancer ([place])     bladder  . Bacteremia due to Gram-negative bacteria ([place]) 04/13/2015     Home Meds: Outpatient Prescriptions Prior to Visit  Medication Sig Dispense Refill  . acetaminophen (TYLENOL) 500 MG tablet Take 500 mg by mouth every 6 (six) hours as needed for moderate pain.     [name] aspirin 81 MG tablet Take 81 mg by mouth daily.    . clonazePAM (KLONOPIN) 0.5 MG tablet Take 0.5 mg by mouth 2 (two) times daily.  0  . colchicine 0.6 MG tablet Take 0.6 mg by mouth. 2 tablets on onset of flare up, then 1 tablet an hour later and then 1 tablet daily until gout is gone    . escitalopram (LEXAPRO) 20 MG tablet Take 40 mg by mouth daily.    . fluticasone (FLONASE) 50 MCG/ACT nasal spray Use 2 sprays in each  nostril daily 48 g 3  . omeprazole (PRILOSEC) 20 MG capsule Take 1 capsule by mouth  daily 90 capsule 3  . rOPINIRole (REQUIP) 3 MG tablet Take 1 tablet by mouth at  bedtime 90 tablet 1  . traZODone  (DESYREL) 100 MG tablet Take 50 mg by mouth daily as needed for sleep.     [name] ULORIC 40 MG tablet Take 1 tablet by mouth  daily 90 tablet 1  . ZETIA 10 MG tablet Take 1 tablet by mouth  every morning 90 tablet 1   No facility-administered medications prior to visit.    Allergies:  Allergies  Allergen Reactions  . Augmentin [Amoxicillin-Pot Clavulanate] Nausea And Vomiting  . Statins Other (See Comments)    Achiness, stiffness per pt  . Zocor [Simvastatin] Other (See Comments)    myalgia    Social History   Social History  . Marital Status: Married    Spouse Name: N/A  . Number of Children: N/A  . Years of Education: N/A   Occupational History  . Not on file.   Social History Main Topics  . Smoking status: Former Smoker -- 0.50 packs/day for 30 years    Types: Cigarettes    Quit date: 07/24/1989  . Smokeless tobacco: Never Used  . Alcohol Use: Yes     Comment: social  . Drug Use: No  . Sexual Activity: Not  on file   Other Topics Concern  . 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 Cancer Mother     ovarian  . Cirrhosis Father   . Alcohol abuse Father      Review of Systems:  See HPI for pertinent ROS. All other ROS negative.    Physical Exam: Blood pressure 114/62, pulse 68, temperature 98.6 F (37 C), temperature source Oral, resp. rate 26, height 5' 8.5" (1.74 m), weight 208 lb (94.348 kg)., Body mass index is 31.16 kg/(m^2). General: WNWD WM. Appears in no acute distress. Neck: Supple. No thyromegaly. No lymphadenopathy. Lungs: Clear bilaterally to auscultation without wheezes, rales, or rhonchi. Breathing is unlabored. Heart: RRR with S1 S2. No murmurs, rubs, or gallops. Abdomen: Ostomy examined. Appears healthy and pink with no evidence of super infection.  Musculoskeletal:  Strength and tone normal for age. Right Back: No tenderness with percussion to  costophrenic angle right.  He points to right ~ L5 level as area of low back pain.  Extremities/Skin: Warm and dry. No clubbing or cyanosis. No edema. No rashes or suspicious lesions. Neuro: Alert and oriented X 3. Moves all extremities spontaneously. Gait is normal. CNII-XII grossly in tact. Psych:  Responds to questions appropriately with a normal affect.     ASSESSMENT AND PLAN:  70 y.o. year old male with  1. Dysuria - Urinalysis, Routine w reflex microscopic (not at [facility])  2. Malignant neoplasm of urinary bladder, unspecified site ([place])  3. UTI (lower urinary tract infection) Will send urine culture.  Will go ahead and start empiric tx with Cipro.   - Urine culture - ciprofloxacin (CIPRO) 500 MG tablet; Take 1 tablet (500 mg total) by mouth 2 (two) times daily.  Dispense: 14 tablet; Refill: 0   Signed, [address] [place], [place], [facility] 01/01/2016 1:02 PM

## 2016-01-03 LAB — URINE CULTURE

## 2016-01-30 ENCOUNTER — Other Ambulatory Visit: Payer: Self-pay | Admitting: Family Medicine

## 2016-01-30 NOTE — Telephone Encounter (Signed)
Refill appropriate and filled per protocol. 

## 2016-02-06 ENCOUNTER — Encounter: Payer: Self-pay | Admitting: Family Medicine

## 2016-02-06 ENCOUNTER — Ambulatory Visit (INDEPENDENT_AMBULATORY_CARE_PROVIDER_SITE_OTHER): Payer: Medicare Other | Admitting: Family Medicine

## 2016-02-06 VITALS — BP 96/60 | HR 58 | Temp 98.0°F | Ht 69.0 in | Wt 211.0 lb

## 2016-02-06 DIAGNOSIS — G2581 Restless legs syndrome: Secondary | ICD-10-CM | POA: Diagnosis not present

## 2016-02-06 DIAGNOSIS — C679 Malignant neoplasm of bladder, unspecified: Secondary | ICD-10-CM

## 2016-02-06 DIAGNOSIS — Z0000 Encounter for general adult medical examination without abnormal findings: Secondary | ICD-10-CM | POA: Diagnosis not present

## 2016-02-06 DIAGNOSIS — R739 Hyperglycemia, unspecified: Secondary | ICD-10-CM

## 2016-02-06 LAB — COMPLETE METABOLIC PANEL WITH GFR
ALBUMIN: 3.8 g/dL (ref 3.6–5.1)
ALK PHOS: 116 U/L — AB (ref 40–115)
ALT: 16 U/L (ref 9–46)
AST: 17 U/L (ref 10–35)
BILIRUBIN TOTAL: 0.3 mg/dL (ref 0.2–1.2)
BUN: 36 mg/dL — ABNORMAL HIGH (ref 7–25)
CO2: 21 mmol/L (ref 20–31)
Calcium: 8.8 mg/dL (ref 8.6–10.3)
Chloride: 108 mmol/L (ref 98–110)
Creat: 2.05 mg/dL — ABNORMAL HIGH (ref 0.70–1.25)
GFR, EST AFRICAN AMERICAN: 37 mL/min — AB (ref >=60)
GFR, EST NON AFRICAN AMERICAN: 32 mL/min — AB (ref >=60)
Glucose, Bld: 89 mg/dL (ref 70–99)
Potassium: 4.6 mmol/L (ref 3.5–5.3)
Sodium: 138 mmol/L (ref 135–146)
TOTAL PROTEIN: 6.8 g/dL (ref 6.1–8.1)

## 2016-02-06 LAB — CBC WITH DIFFERENTIAL/PLATELET
BASOS ABS: 56 {cells}/uL (ref 0–200)
Basophils Relative: 1 %
EOS ABS: 56 {cells}/uL (ref 15–500)
Eosinophils Relative: 1 %
HCT: 39.6 % (ref 38.5–50.0)
Hemoglobin: 13.1 g/dL (ref 13.0–17.0)
Lymphocytes Relative: 25 %
Lymphs Abs: 1400 cells/uL (ref 850–3900)
MCH: 29.2 pg (ref 27.0–33.0)
MCHC: 33.1 g/dL (ref 32.0–36.0)
MCV: 88.2 fL (ref 80.0–100.0)
MONOS PCT: 9 %
MPV: 9.2 fL (ref 7.5–12.5)
Monocytes Absolute: 504 cells/uL (ref 200–950)
NEUTROS ABS: 3584 {cells}/uL (ref 1500–7800)
Neutrophils Relative %: 64 %
PLATELETS: 210 10*3/uL (ref 140–400)
RBC: 4.49 MIL/uL (ref 4.20–5.80)
RDW: 14.3 % (ref 11.0–15.0)
WBC: 5.6 10*3/uL (ref 3.8–10.8)

## 2016-02-06 LAB — LIPID PANEL
CHOLESTEROL: 156 mg/dL (ref 125–200)
HDL: 31 mg/dL — ABNORMAL LOW (ref >=40)
LDL Cholesterol: 53 mg/dL (ref <130)
Total CHOL/HDL Ratio: 5 Ratio (ref <=5.0)
Triglycerides: 361 mg/dL — ABNORMAL HIGH (ref <150)
VLDL: 72 mg/dL — ABNORMAL HIGH (ref <30)

## 2016-02-06 LAB — HEMOGLOBIN A1C
HEMOGLOBIN A1C: 6.2 % — AB (ref <5.7)
MEAN PLASMA GLUCOSE: 131 mg/dL

## 2016-02-06 NOTE — Progress Notes (Signed)
Subjective:    Patient ID: [name], male    DOB: 05/28/1946, 70 y.o.   MRN: [id number]  HPI Is here today for complete physical exam. He denies any concerns. His blood pressure is low today but he denies any dizziness or lightheadedness. He denies any blood in his stool or recent episodes of bleeding. He is drinking plenty of water. In June, he was found to have urinary tract infection. He also had a random blood sugar of 194. This needs to be followed up. He denies any polyuria or dysuria or blurry vision. Colonoscopy was performed in 2010 and is not due again until 2020. 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 His bladder has been completely removed. They also removed his prostate at the same time. They did find a small focus of prostate cancer when he did this however this is followed by his urologist and oncologist. Immunizations are all up-to-date. He declines a tetanus shot and hepatitis C screening. Immunization History  Administered Date(s) Administered  . Influenza Split 05/25/2013  . Influenza,inj,Quad PF,36+ Mos 04/22/2014, 04/28/2015  . Pneumococcal Conjugate-13 04/22/2014  . Pneumococcal Polysaccharide-23 04/28/2015  . Zoster 01/27/2009   Past Medical History:  Diagnosis Date  . Allergy   . Bacteremia due to Gram-negative bacteria ([facility]) 04/13/2015  . Bladder rupture 2010  . Bladder tumor   . Cancer ([place])    bladder  . Depression   . Gout   . 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 Hyperlipidemia   . Restless leg syndrome, controlled    Past Surgical History:  Procedure Laterality Date  . APPENDECTOMY    . BLADDER SURGERY  2010  . CYSTOSCOPY W/ RETROGRADES Bilateral 04/21/2013   Procedure: CYSTOSCOPY WITH RETROGRADE PYELOGRAM;  Surgeon: Molli Hazard, MD;  Location: [facility];  Service: Urology;  Laterality: Bilateral;  . FRACTURE SURGERY Right 01/05/2005   lower leg/ankle  . NASAL SEPTUM SURGERY    . TONSILLECTOMY AND ADENOIDECTOMY    .  TRANSURETHRAL RESECTION OF BLADDER TUMOR WITH GYRUS (TURBT-GYRUS) N/A 04/21/2013   Procedure: TRANSURETHRAL RESECTION OF BLADDER TUMOR WITH GYRUS (TURBT-GYRUS), cystoscopy;  Surgeon: Molli Hazard, MD;  Location: [facility];  Service: Urology;  Laterality: N/A;   Current Outpatient Prescriptions on File Prior to Visit  Medication Sig Dispense Refill  . acetaminophen (TYLENOL) 500 MG tablet Take 500 mg by mouth every 6 (six) hours as needed for moderate pain.     [name] aspirin 81 MG tablet Take 81 mg by mouth daily.    . ciprofloxacin (CIPRO) 500 MG tablet Take 1 tablet (500 mg total) by mouth 2 (two) times daily. 14 tablet 0  . clonazePAM (KLONOPIN) 0.5 MG tablet Take 0.5 mg by mouth 2 (two) times daily.  0  . colchicine 0.6 MG tablet Take 0.6 mg by mouth. 2 tablets on onset of flare up, then 1 tablet an hour later and then 1 tablet daily until gout is gone    . escitalopram (LEXAPRO) 20 MG tablet Take 40 mg by mouth daily.    . fluticasone (FLONASE) 50 MCG/ACT nasal spray Use 2 sprays in each  nostril daily 48 g 3  . omeprazole (PRILOSEC) 20 MG capsule Take 1 capsule by mouth  daily 90 capsule 3  . rOPINIRole (REQUIP) 3 MG tablet Take 1 tablet by mouth at  bedtime 90 tablet 1  . traZODone (DESYREL) 100 MG tablet Take 50 mg by mouth daily as needed for sleep.     [name]  ULORIC 40 MG tablet Take 1 tablet by mouth  daily 90 tablet 0  . ZETIA 10 MG tablet Take 1 tablet by mouth  every morning 90 tablet 0   No current facility-administered medications on file prior to visit.    Allergies  Allergen Reactions  . Augmentin [Amoxicillin-Pot Clavulanate] Nausea And Vomiting  . Statins Other (See Comments)    Achiness, stiffness per pt  . Zocor [Simvastatin] Other (See Comments)    myalgia   Social History   Social History  . Marital status: Married    Spouse name: N/A  . Number of children: N/A  . Years of education: N/A   Occupational History  . Not on file.   Social History Main Topics  .  Smoking status: Former Smoker    Packs/day: 0.50    Years: 30.00    Types: Cigarettes    Quit date: 07/24/1989  . Smokeless tobacco: Never Used  . Alcohol use Yes     Comment: social  . Drug use: No  . Sexual activity: Not on file   Other Topics Concern  . 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 Cancer Mother     ovarian  . Cirrhosis Father   . Alcohol abuse Father      Review of Systems  All other systems reviewed and are negative.      Objective:   Physical Exam  Constitutional: He is oriented to person, place, and time. He appears well-developed and well-nourished. No distress.  HENT:  Head: Normocephalic and atraumatic.  Right Ear: External ear normal.  Left Ear: External ear normal.  Nose: Nose normal.  Mouth/Throat: Oropharynx is clear and moist. No oropharyngeal exudate.  Eyes: Conjunctivae and EOM are normal. Pupils are equal, round, and reactive to light. Right eye exhibits no discharge. Left eye exhibits no discharge. No scleral icterus.  Neck: Normal range of motion. Neck supple. No JVD present. No tracheal deviation present. No thyromegaly present.  Cardiovascular: Normal rate, regular rhythm, normal heart sounds and intact distal pulses.  Exam reveals no gallop and no friction rub.   No murmur heard. Pulmonary/Chest: Effort normal and breath sounds normal. No stridor. No respiratory distress. He has no wheezes. He has no rales. He exhibits no tenderness.  Abdominal: Soft. Bowel sounds are normal. He exhibits no distension and no mass. There is no tenderness. There is no rebound and no guarding.  Musculoskeletal: Normal range of motion. He exhibits no edema, tenderness or deformity.  Lymphadenopathy:    He has no cervical adenopathy.  Neurological: He is alert and oriented to person, place, and time. He has normal reflexes. He displays normal reflexes. No  cranial nerve deficit. He exhibits normal muscle tone. Coordination normal.  Skin: Skin is warm. No rash noted. He is not diaphoretic. No erythema. No pallor.  Psychiatric: He has a normal mood and affect. His behavior is normal. Judgment and thought content normal.  Vitals reviewed.         Assessment & Plan:  Routine general medical examination at a health care facility - Plan: CBC with Differential/Platelet, COMPLETE METABOLIC PANEL WITH GFR, Hemoglobin A1c, Lipid panel  Hyperglycemia - Plan: COMPLETE METABOLIC PANEL WITH GFR, Hemoglobin A1c  Malignant neoplasm of urinary bladder, unspecified site (HCC)  Restless leg syndrome, controlled  Physical exam today is completely normal. His blood  pressure slightly on the low side is asymptomatic. I will check a CBC to monitor for anemia. He is also found to have a random sugar that was elevated. I will check a hemoglobin A1c. Colonoscopy is up-to-date. Prostate is been removed and is followed by his oncologist. Immunizations are up-to-date. He declines hepatitis C screening.

## 2016-02-09 ENCOUNTER — Encounter: Payer: Self-pay | Admitting: Family Medicine

## 2016-03-06 ENCOUNTER — Other Ambulatory Visit: Payer: Self-pay | Admitting: *Deleted

## 2016-03-06 MED ORDER — OMEPRAZOLE 20 MG PO CPDR: 20.0000 mg | DELAYED_RELEASE_CAPSULE | Freq: Two times a day (BID) | ORAL | 3 refills | Status: DC

## 2016-03-06 NOTE — Telephone Encounter (Signed)
Received call from patient spouse, Velva Harman.   Reports that GI has increased Omeprazole to BID with meals.   Requested refill on medication.   Prescription sent to pharmacy.

## 2016-03-19 DIAGNOSIS — F332 Major depressive disorder, recurrent severe without psychotic features: Secondary | ICD-10-CM | POA: Diagnosis not present

## 2016-03-30 ENCOUNTER — Telehealth: Payer: Self-pay | Admitting: Family Medicine

## 2016-03-30 NOTE — Telephone Encounter (Signed)
Pt LMOVM stating he needed last CPE and Lab results faxed to [facility] at [phone] - Notes and labs faxed through Epic

## 2016-04-12 ENCOUNTER — Other Ambulatory Visit: Payer: Self-pay | Admitting: Family Medicine

## 2016-04-12 NOTE — Telephone Encounter (Signed)
Rx refilled per protocol 

## 2016-05-09 ENCOUNTER — Ambulatory Visit (INDEPENDENT_AMBULATORY_CARE_PROVIDER_SITE_OTHER): Payer: Medicare Other | Admitting: *Deleted

## 2016-05-09 DIAGNOSIS — Z23 Encounter for immunization: Secondary | ICD-10-CM

## 2016-05-09 NOTE — Progress Notes (Signed)
Patient ID: AUTHOR SLAVEN, male   DOB: 1945/08/01, 70 y.o.   MRN: [id number]  Patient seen in office for Influenza Vaccination.   Tolerated IM administration well.   Immunization history updated.

## 2016-06-05 ENCOUNTER — Ambulatory Visit (HOSPITAL_COMMUNITY)
Admission: RE | Admit: 2016-06-05 | Discharge: 2016-06-05 | Disposition: A | Discharge status: Home / Self Care | Payer: Medicare Other | Source: Ambulatory Visit | Attending: Urology | Admitting: Urology

## 2016-06-05 ENCOUNTER — Other Ambulatory Visit: Payer: Self-pay | Admitting: Urology

## 2016-06-05 DIAGNOSIS — Z8551 Personal history of malignant neoplasm of bladder: Secondary | ICD-10-CM | POA: Diagnosis not present

## 2016-06-06 DIAGNOSIS — M545 Low back pain: Secondary | ICD-10-CM | POA: Diagnosis not present

## 2016-06-12 DIAGNOSIS — Z8546 Personal history of malignant neoplasm of prostate: Secondary | ICD-10-CM | POA: Diagnosis not present

## 2016-06-12 DIAGNOSIS — N189 Chronic kidney disease, unspecified: Secondary | ICD-10-CM | POA: Diagnosis not present

## 2016-06-12 DIAGNOSIS — Z8551 Personal history of malignant neoplasm of bladder: Secondary | ICD-10-CM | POA: Diagnosis not present

## 2016-06-18 DIAGNOSIS — M545 Low back pain: Secondary | ICD-10-CM | POA: Diagnosis not present

## 2016-06-20 DIAGNOSIS — M545 Low back pain: Secondary | ICD-10-CM | POA: Diagnosis not present

## 2016-06-24 DIAGNOSIS — M545 Low back pain: Secondary | ICD-10-CM | POA: Diagnosis not present

## 2016-06-26 ENCOUNTER — Ambulatory Visit (HOSPITAL_BASED_OUTPATIENT_CLINIC_OR_DEPARTMENT_OTHER): Payer: Medicare Other | Admitting: Oncology

## 2016-06-26 ENCOUNTER — Telehealth: Payer: Self-pay | Admitting: Oncology

## 2016-06-26 DIAGNOSIS — C679 Malignant neoplasm of bladder, unspecified: Secondary | ICD-10-CM | POA: Diagnosis not present

## 2016-06-26 DIAGNOSIS — N289 Disorder of kidney and ureter, unspecified: Secondary | ICD-10-CM | POA: Diagnosis not present

## 2016-06-26 NOTE — Telephone Encounter (Signed)
Gave patient avs report and appointments for June.  °

## 2016-06-26 NOTE — Progress Notes (Signed)
Hematology and Oncology Follow Up Visit  [name] [id number] March 19, 1946 70 y.o. 06/26/2016 11:00 AM [name],[name], MDPickard, [name], MD   Principle Diagnosis: 70 year old gentleman with muscle invasive bladder cancer diagnosed in October of 2014 presented with a T2 N0 disease.  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 He is S/P neoadjuvant chemotherapy with cisplatinum and Gemzar started on 06/11/2013. He is status post 3 cycle of chemotherapy completed in January of 2015. He underwent a cystectomy on 10/05/2013 at [facility]. The pathology revealed no residual tumor with 12 lymph nodes sampled noninvolved with malignancy. 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 The pathology did not reveal any malignancy of his left kidney.  Current therapy:  Observation and surveillance.  Interim History: [name] presents today for a followup visit by himself. Since his last visit, he continues to do very well without any major changes in his health. He continues to exercise regularly and I'll intentionally lost 10 pounds last 4 months. He denied any hematuria, fevers or chills. He continues to perform activities of daily living without any decline. He continues to follow at [facility] and had imaging studies done in May 2017 which showed no evidence of disease by CT scan and a chest x-ray.   He does not report any headaches, blurry vision, syncope or seizures. He has not reported any nausea or vomiting or abdominal pain. Has not reported any genitourinary complaints. He denies any chest pain shortness of breath cough or hemoptysis. He denies any any frequency urgency or hesitancy. Does not report any skeletal complaints. Is not reporting rashes or lesions. Rest of his review of systems unremarkable.  Medications: I have reviewed the patient's current  medications.  Current Outpatient Prescriptions  Medication Sig Dispense Refill  . acetaminophen (TYLENOL) 500 MG tablet Take 500 mg by mouth every 6 (six) hours as needed for moderate pain.     [name] aspirin 81 MG tablet Take 81 mg by mouth daily.    . clonazePAM (KLONOPIN) 0.5 MG tablet Take 0.5 mg by mouth 2 (two) times daily.  0  . colchicine 0.6 MG tablet Take 0.6 mg by mouth. 2 tablets on onset of flare up, then 1 tablet an hour later and then 1 tablet daily until gout is gone    . escitalopram (LEXAPRO) 20 MG tablet Take 40 mg by mouth daily.    [name] ezetimibe (ZETIA) 10 MG tablet TAKE 1 TABLET BY MOUTH  EVERY MORNING 90 tablet 0  . fluticasone (FLONASE) 50 MCG/ACT nasal spray Use 2 sprays in each  nostril daily 48 g 3  . omeprazole (PRILOSEC) 20 MG capsule Take 1 capsule (20 mg total) by mouth 2 (two) times daily before a meal. 180 capsule 3  . rOPINIRole (REQUIP) 3 MG tablet Take 1 tablet by mouth at  bedtime 90 tablet 1  . traZODone (DESYREL) 100 MG tablet Take 50 mg by mouth daily as needed for sleep.     [name] ULORIC 40 MG tablet TAKE 1 TABLET BY MOUTH  DAILY 90 tablet 0   No current facility-administered medications for this visit.      Allergies:  Allergies  Allergen Reactions  . Augmentin [Amoxicillin-Pot Clavulanate] Nausea And Vomiting  . Statins Other (See Comments)    Achiness, stiffness per pt  . Zocor [Simvastatin] Other (See Comments)    myalgia    Past  Medical History, Surgical history, Social history, and Family History were reviewed and updated.    Physical Exam: There were no vitals taken for this visit. ECOG: 0 General appearance: Alert, awake gentleman without distress. Head: Sclera are anicteric. No oral thrush noted. Neck: no adenopathy Lymph nodes: Cervical, supraclavicular, and axillary nodes normal. Heart:regular rate and rhythm, S1, S2 normal, no murmur, click, rub or gallop Lung:chest clear, no wheezing, rales, normal symmetric air entry no dullness to  percussion. Abdomen: soft, non-tender, without masses or organomegaly no rebound or guarding. EXT:no erythema, induration, or nodules   Lab Results: Lab Results  Component Value Date   WBC 5.6 02/06/2016   HGB 13.1 02/06/2016   HCT 39.6 02/06/2016   MCV 88.2 02/06/2016   PLT 210 02/06/2016     Chemistry      Component Value Date/Time   NA 138 02/06/2016 0853   NA 138 12/26/2015 1045   K 4.6 02/06/2016 0853   K 4.8 12/26/2015 1045   CL 108 02/06/2016 0853   CO2 21 02/06/2016 0853   CO2 23 12/26/2015 1045   BUN 36 (H) 02/06/2016 0853   BUN 22.4 12/26/2015 1045   CREATININE 2.05 (H) 02/06/2016 0853   CREATININE 2.3 (H) 12/26/2015 1045      Component Value Date/Time   CALCIUM 8.8 02/06/2016 0853   CALCIUM 9.1 12/26/2015 1045   ALKPHOS 116 (H) 02/06/2016 0853   ALKPHOS 124 12/26/2015 1045   AST 17 02/06/2016 0853   AST 16 12/26/2015 1045   ALT 16 02/06/2016 0853   ALT 17 12/26/2015 1045   BILITOT 0.3 02/06/2016 0853   BILITOT 0.34 12/26/2015 1045        Impression and Plan:  31 - year old with the following issues:  1. T2 N0 muscle invasive bladder cancer diagnosed in October of 2014 after he presented with hematuria. He underwent a radical cystectomy on 10/05/2013 without any complications. His pathology did not reveal any residual disease at this time I see no indication for any further chemotherapy or adjuvant radiation therapy.   The plan is to continue with active surveillance at this time. He is receiving surveillance imaging studies at [facility] and he is up to date at this time. He will continue to follow their 4 imaging studies that will be repeated in 12-10-16.  2. Renal insufficiency: he is status post nephrectomy in January 2016 for recurrent hydronephrosis and pyelonephritis. Creatinine is close to baseline around 2.2.  3. Followup: In 6 months for a clinical visit.    [id number] 12/20/201711:00 AM

## 2016-06-29 ENCOUNTER — Other Ambulatory Visit: Payer: Self-pay | Admitting: Family Medicine

## 2016-07-04 DIAGNOSIS — M545 Low back pain: Secondary | ICD-10-CM | POA: Diagnosis not present

## 2016-07-15 DIAGNOSIS — M545 Low back pain: Secondary | ICD-10-CM | POA: Diagnosis not present

## 2016-07-17 DIAGNOSIS — M545 Low back pain: Secondary | ICD-10-CM | POA: Diagnosis not present

## 2016-07-18 ENCOUNTER — Other Ambulatory Visit: Payer: Self-pay | Admitting: Nurse Practitioner

## 2016-07-23 DIAGNOSIS — M545 Low back pain: Secondary | ICD-10-CM | POA: Diagnosis not present

## 2016-07-27 DIAGNOSIS — M545 Low back pain: Secondary | ICD-10-CM | POA: Diagnosis not present

## 2016-07-30 DIAGNOSIS — M545 Low back pain: Secondary | ICD-10-CM | POA: Diagnosis not present

## 2016-08-02 DIAGNOSIS — M545 Low back pain: Secondary | ICD-10-CM | POA: Diagnosis not present

## 2016-08-06 DIAGNOSIS — M545 Low back pain: Secondary | ICD-10-CM | POA: Diagnosis not present

## 2016-08-09 DIAGNOSIS — M545 Low back pain: Secondary | ICD-10-CM | POA: Diagnosis not present

## 2016-08-24 ENCOUNTER — Encounter (HOSPITAL_COMMUNITY): Payer: Self-pay | Admitting: Emergency Medicine

## 2016-08-24 ENCOUNTER — Emergency Department (HOSPITAL_COMMUNITY): Payer: Medicare Other

## 2016-08-24 ENCOUNTER — Inpatient Hospital Stay (HOSPITAL_COMMUNITY)
Admission: EM | Admit: 2016-08-24 | Discharge: 2016-08-26 | DRG: 872 | Disposition: A | Discharge status: Home / Self Care | Payer: Medicare Other | Attending: Internal Medicine | Admitting: Internal Medicine

## 2016-08-24 DIAGNOSIS — Z906 Acquired absence of other parts of urinary tract: Secondary | ICD-10-CM

## 2016-08-24 DIAGNOSIS — F329 Major depressive disorder, single episode, unspecified: Secondary | ICD-10-CM | POA: Diagnosis present

## 2016-08-24 DIAGNOSIS — R5383 Other fatigue: Secondary | ICD-10-CM | POA: Diagnosis not present

## 2016-08-24 DIAGNOSIS — N179 Acute kidney failure, unspecified: Secondary | ICD-10-CM | POA: Diagnosis present

## 2016-08-24 DIAGNOSIS — N1339 Other hydronephrosis: Secondary | ICD-10-CM | POA: Diagnosis not present

## 2016-08-24 DIAGNOSIS — A419 Sepsis, unspecified organism: Principal | ICD-10-CM | POA: Diagnosis present

## 2016-08-24 DIAGNOSIS — R8271 Bacteriuria: Secondary | ICD-10-CM

## 2016-08-24 DIAGNOSIS — M109 Gout, unspecified: Secondary | ICD-10-CM | POA: Diagnosis present

## 2016-08-24 DIAGNOSIS — R6889 Other general symptoms and signs: Secondary | ICD-10-CM

## 2016-08-24 DIAGNOSIS — N390 Urinary tract infection, site not specified: Secondary | ICD-10-CM | POA: Diagnosis not present

## 2016-08-24 DIAGNOSIS — Z7982 Long term (current) use of aspirin: Secondary | ICD-10-CM

## 2016-08-24 DIAGNOSIS — M791 Myalgia, unspecified site: Secondary | ICD-10-CM

## 2016-08-24 DIAGNOSIS — Z8551 Personal history of malignant neoplasm of bladder: Secondary | ICD-10-CM

## 2016-08-24 DIAGNOSIS — Z936 Other artificial openings of urinary tract status: Secondary | ICD-10-CM | POA: Diagnosis not present

## 2016-08-24 DIAGNOSIS — R509 Fever, unspecified: Secondary | ICD-10-CM | POA: Diagnosis not present

## 2016-08-24 DIAGNOSIS — B9620 Unspecified Escherichia coli [E. coli] as the cause of diseases classified elsewhere: Secondary | ICD-10-CM | POA: Diagnosis present

## 2016-08-24 DIAGNOSIS — G2581 Restless legs syndrome: Secondary | ICD-10-CM | POA: Diagnosis present

## 2016-08-24 DIAGNOSIS — N183 Chronic kidney disease, stage 3 unspecified: Secondary | ICD-10-CM

## 2016-08-24 DIAGNOSIS — Z7951 Long term (current) use of inhaled steroids: Secondary | ICD-10-CM

## 2016-08-24 DIAGNOSIS — Z932 Ileostomy status: Secondary | ICD-10-CM

## 2016-08-24 DIAGNOSIS — Z905 Acquired absence of kidney: Secondary | ICD-10-CM

## 2016-08-24 DIAGNOSIS — Z888 Allergy status to other drugs, medicaments and biological substances status: Secondary | ICD-10-CM

## 2016-08-24 DIAGNOSIS — Z881 Allergy status to other antibiotic agents status: Secondary | ICD-10-CM

## 2016-08-24 DIAGNOSIS — Z9221 Personal history of antineoplastic chemotherapy: Secondary | ICD-10-CM

## 2016-08-24 DIAGNOSIS — C679 Malignant neoplasm of bladder, unspecified: Secondary | ICD-10-CM | POA: Diagnosis present

## 2016-08-24 DIAGNOSIS — R740 Nonspecific elevation of levels of transaminase and lactic acid dehydrogenase [LDH]: Secondary | ICD-10-CM | POA: Diagnosis not present

## 2016-08-24 DIAGNOSIS — K219 Gastro-esophageal reflux disease without esophagitis: Secondary | ICD-10-CM | POA: Diagnosis present

## 2016-08-24 DIAGNOSIS — R7989 Other specified abnormal findings of blood chemistry: Secondary | ICD-10-CM

## 2016-08-24 DIAGNOSIS — N200 Calculus of kidney: Secondary | ICD-10-CM | POA: Diagnosis present

## 2016-08-24 DIAGNOSIS — E785 Hyperlipidemia, unspecified: Secondary | ICD-10-CM | POA: Diagnosis present

## 2016-08-24 LAB — URINALYSIS, ROUTINE W REFLEX MICROSCOPIC
Bilirubin Urine: NEGATIVE
Glucose, UA: NEGATIVE mg/dL
Ketones, ur: NEGATIVE mg/dL
Nitrite: NEGATIVE
Protein, ur: 100 mg/dL — AB
Specific Gravity, Urine: 1.012 (ref 1.005–1.030)
Squamous Epithelial / LPF: NONE SEEN
pH: 6 (ref 5.0–8.0)

## 2016-08-24 LAB — CBC
HEMATOCRIT: 40.6 % (ref 39.0–52.0)
HEMOGLOBIN: 13.4 g/dL (ref 13.0–17.0)
MCH: 29.7 pg (ref 26.0–34.0)
MCHC: 33 g/dL (ref 30.0–36.0)
MCV: 90 fL (ref 78.0–100.0)
Platelets: 180 10*3/uL (ref 150–400)
RBC: 4.51 MIL/uL (ref 4.22–5.81)
RDW: 13.3 % (ref 11.5–15.5)
WBC: 6.9 10*3/uL (ref 4.0–10.5)

## 2016-08-24 LAB — HEPATIC FUNCTION PANEL
ALBUMIN: 3.7 g/dL (ref 3.5–5.0)
ALK PHOS: 86 U/L (ref 38–126)
ALT: 20 U/L (ref 17–63)
AST: 33 U/L (ref 15–41)
BILIRUBIN DIRECT: 0.1 mg/dL (ref 0.1–0.5)
BILIRUBIN TOTAL: 0.7 mg/dL (ref 0.3–1.2)
Indirect Bilirubin: 0.6 mg/dL (ref 0.3–0.9)
Total Protein: 7.6 g/dL (ref 6.5–8.1)

## 2016-08-24 LAB — BASIC METABOLIC PANEL
ANION GAP: 10 (ref 5–15)
BUN: 30 mg/dL — ABNORMAL HIGH (ref 6–20)
CALCIUM: 8.6 mg/dL — AB (ref 8.9–10.3)
CHLORIDE: 102 mmol/L (ref 101–111)
CO2: 23 mmol/L (ref 22–32)
Creatinine, Ser: 2.49 mg/dL — ABNORMAL HIGH (ref 0.61–1.24)
GFR calc non Af Amer: 25 mL/min — ABNORMAL LOW (ref >60)
GFR, EST AFRICAN AMERICAN: 29 mL/min — AB (ref >60)
GLUCOSE: 80 mg/dL (ref 65–99)
Potassium: 4.2 mmol/L (ref 3.5–5.1)
Sodium: 135 mmol/L (ref 135–145)

## 2016-08-24 LAB — I-STAT CG4 LACTIC ACID, ED: Lactic Acid, Venous: 2.33 mmol/L (ref 0.5–1.9)

## 2016-08-24 LAB — INFLUENZA PANEL BY PCR (TYPE A & B)
Influenza A By PCR: NEGATIVE
Influenza B By PCR: NEGATIVE

## 2016-08-24 LAB — LACTIC ACID, PLASMA: LACTIC ACID, VENOUS: 1.4 mmol/L (ref 0.5–1.9)

## 2016-08-24 MED ORDER — SODIUM CHLORIDE 0.9 % IV SOLN
INTRAVENOUS | Status: DC
  Administered 2016-08-24: 22:00:00 via INTRAVENOUS
  Administered 2016-08-25: 1000 mL via INTRAVENOUS

## 2016-08-24 MED ORDER — ACETAMINOPHEN 325 MG PO TABS
650.0000 mg | ORAL_TABLET | Freq: Four times a day (QID) | ORAL | Status: DC | PRN
  Administered 2016-08-24: 650 mg via ORAL
  Filled 2016-08-24: qty 2

## 2016-08-24 MED ORDER — ONDANSETRON HCL 4 MG/2ML IJ SOLN: 4.0000 mg | Freq: Four times a day (QID) | INTRAMUSCULAR | Status: DC | PRN

## 2016-08-24 MED ORDER — DEXTROSE 5 % IV SOLN
2.0000 g | INTRAVENOUS | Status: DC
  Administered 2016-08-25: 2 g via INTRAVENOUS
  Filled 2016-08-24 (×2): qty 2

## 2016-08-24 MED ORDER — ASPIRIN EC 81 MG PO TBEC
81.0000 mg | DELAYED_RELEASE_TABLET | Freq: Every day | ORAL | Status: DC
  Administered 2016-08-24 – 2016-08-25 (×2): 81 mg via ORAL
  Filled 2016-08-24 (×2): qty 1

## 2016-08-24 MED ORDER — ACETAMINOPHEN 650 MG RE SUPP: 650.0000 mg | Freq: Four times a day (QID) | RECTAL | Status: DC | PRN

## 2016-08-24 MED ORDER — ROPINIROLE HCL 1 MG PO TABS
3.0000 mg | ORAL_TABLET | Freq: Every day | ORAL | Status: DC
  Administered 2016-08-24 – 2016-08-25 (×2): 3 mg via ORAL
  Filled 2016-08-24 (×3): qty 3

## 2016-08-24 MED ORDER — TRAZODONE HCL 50 MG PO TABS: 50.0000 mg | ORAL_TABLET | Freq: Every day | ORAL | Status: DC | PRN

## 2016-08-24 MED ORDER — FEBUXOSTAT 40 MG PO TABS
40.0000 mg | ORAL_TABLET | Freq: Every day | ORAL | Status: DC
  Administered 2016-08-25 – 2016-08-26 (×2): 40 mg via ORAL
  Filled 2016-08-24 (×2): qty 1

## 2016-08-24 MED ORDER — SODIUM CHLORIDE 0.9 % IV BOLUS (SEPSIS)
1000.0000 mL | Freq: Once | INTRAVENOUS | Status: AC
  Administered 2016-08-24: 1000 mL via INTRAVENOUS

## 2016-08-24 MED ORDER — CLONAZEPAM 0.5 MG PO TABS
0.5000 mg | ORAL_TABLET | Freq: Two times a day (BID) | ORAL | Status: DC
  Administered 2016-08-24 – 2016-08-26 (×4): 0.5 mg via ORAL
  Filled 2016-08-24 (×5): qty 1

## 2016-08-24 MED ORDER — DEXTROSE 5 % IV SOLN
1.0000 g | Freq: Once | INTRAVENOUS | Status: AC
  Administered 2016-08-24: 1 g via INTRAVENOUS
  Filled 2016-08-24: qty 10

## 2016-08-24 MED ORDER — EZETIMIBE 10 MG PO TABS
10.0000 mg | ORAL_TABLET | Freq: Every day | ORAL | Status: DC
  Administered 2016-08-25 – 2016-08-26 (×2): 10 mg via ORAL
  Filled 2016-08-24 (×2): qty 1

## 2016-08-24 MED ORDER — ESCITALOPRAM OXALATE 10 MG PO TABS
40.0000 mg | ORAL_TABLET | Freq: Every day | ORAL | Status: DC
  Administered 2016-08-24 – 2016-08-25 (×2): 40 mg via ORAL
  Filled 2016-08-24 (×3): qty 4

## 2016-08-24 MED ORDER — PANTOPRAZOLE SODIUM 40 MG PO TBEC
40.0000 mg | DELAYED_RELEASE_TABLET | Freq: Every day | ORAL | Status: DC
  Administered 2016-08-25 – 2016-08-26 (×2): 40 mg via ORAL
  Filled 2016-08-24 (×2): qty 1

## 2016-08-24 MED ORDER — ONDANSETRON HCL 4 MG PO TABS: 4.0000 mg | ORAL_TABLET | Freq: Four times a day (QID) | ORAL | Status: DC | PRN

## 2016-08-24 NOTE — H&P (Signed)
History and Physical    [name] [id number] DOB: 1945/12/22 DOA: 08/24/2016  PCP: [name], MD  Heme/OncOM:[id number] Urology: Jeffie Pollock  Patient coming from: Home  Chief Complaint: Fever, malaise, body aches, cloudy urine  HPI: [name] is a 71 y.o. gentleman with a history of bladder cancer S/P cystectomy with ileal conduit and chemotherapy, S/P left nephrectomy for chronic hydronephrosis, CKD 3 (baseline creatinine 2.2), HLD, RLS, and gout who presents to the ED for evaluation of fever, malaise, fatigue, and body aches.  Symptoms started Thursday around noon.  He documented fever to 101.4 at home, which he treated with acetaminophen.  No associated cough.  No nausea, vomiting, or abdominal pain.  No light-headedness or syncope.  No known sick contacts.  Despite attempts at aggressive hydration on Friday, he noticed that he his urine had become cloudy.  He has continued to not feel well, so he presented to the ED tonight for further evaluation.  No chest pain or shortness of breath.  He takes the flu shot every year.  ED Course: Tmax in the ED 99.9.  Normal WBC count.  Lactic acid level 2.33.  BUN 30, Creatinine 2.49.  U/A shows large leukocytes, many bacteria, TNTC WBC.  Urine and blood cultures are pending.  Flu screen is pending.  He has received 1L NS.  He has received IV Rocephin.  Hospitalist asked to place in observation since he is high risk for complications due to his history of CKD and single kidney.  Review of Systems: As per HPI otherwise 10 point review of systems negative.    Past Medical History:  Diagnosis Date  . Allergy   . Bacteremia due to Gram-negative bacteria 04/13/2015  . Bladder rupture 2010  . Bladder tumor   . Cancer ([place])    bladder  . Depression   . Gout   . 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 Hyperlipidemia   . Restless leg syndrome, controlled     Past Surgical History:  Procedure Laterality Date  . APPENDECTOMY    .  BLADDER SURGERY  2010  . CYSTOSCOPY W/ RETROGRADES Bilateral 04/21/2013   Procedure: CYSTOSCOPY WITH RETROGRADE PYELOGRAM;  Surgeon: Molli Hazard, MD;  Location: [facility];  Service: Urology;  Laterality: Bilateral;  . FRACTURE SURGERY Right 01/05/2005   lower leg/ankle  . NASAL SEPTUM SURGERY    . TONSILLECTOMY AND ADENOIDECTOMY    . TRANSURETHRAL RESECTION OF BLADDER TUMOR WITH GYRUS (TURBT-GYRUS) N/A 04/21/2013   Procedure: TRANSURETHRAL RESECTION OF BLADDER TUMOR WITH GYRUS (TURBT-GYRUS), cystoscopy;  Surgeon: Molli Hazard, MD;  Location: [facility];  Service: Urology;  Laterality: N/A;     reports that he quit smoking about 27 years ago. His smoking use included Cigarettes. He has a 15.00 pack-year smoking history. He has never used smokeless tobacco. He reports that he drinks alcohol. He reports that he does not use drugs.  Occasional EtOH. He is married.  He has fraternal twins.  Allergies  Allergen Reactions  . Augmentin [Amoxicillin-Pot Clavulanate] Nausea And Vomiting and Other (See Comments)    Has patient had a PCN reaction causing immediate rash, facial/tongue/throat swelling, SOB or lightheadedness with hypotension: No Has patient had a PCN reaction causing severe rash involving mucus membranes or skin necrosis: No Has patient had a PCN reaction that required hospitalization No Has patient had a PCN reaction occurring within the last 10 years: No If all of the above answers are "NO", then may proceed  with Cephalosporin use.  . Statins Other (See Comments)    Reaction:  Muscle pain     Family History  Problem Relation Age of Onset  . Cancer Mother     ovarian  . Cirrhosis Father   . Alcohol abuse Father      Prior to Admission medications   Medication Sig Start Date End Date Taking? Authorizing Provider  acetaminophen (TYLENOL) 500 MG tablet Take 1,000 mg by mouth every 6 (six) hours as needed for mild pain, moderate pain or headache.    Yes Historical  Provider, MD  aspirin EC 81 MG tablet Take 81 mg by mouth at bedtime.   Yes Historical Provider, MD  clonazePAM (KLONOPIN) 0.5 MG tablet Take 0.5 mg by mouth 2 (two) times daily.   Yes Historical Provider, MD  colchicine 0.6 MG tablet Take 0.6-1.2 mg by mouth 2 (two) times daily as needed (for gout flare).    Yes Historical Provider, MD  escitalopram (LEXAPRO) 20 MG tablet Take 40 mg by mouth at bedtime.    Yes Historical Provider, MD  ezetimibe (ZETIA) 10 MG tablet Take 10 mg by mouth daily.   Yes Historical Provider, MD  febuxostat (ULORIC) 40 MG tablet Take 40 mg by mouth daily.   Yes Historical Provider, MD  fluticasone (FLONASE) 50 MCG/ACT nasal spray Place 2 sprays into [name] nostrils daily.   Yes Historical Provider, MD  omeprazole (PRILOSEC) 20 MG capsule Take 1 capsule (20 mg total) by mouth 2 (two) times daily before a meal. 03/06/16  Yes [name], MD  rOPINIRole (REQUIP) 3 MG tablet Take 3 mg by mouth at bedtime.   Yes Historical Provider, MD  traZODone (DESYREL) 100 MG tablet Take 50 mg by mouth daily as needed for sleep.    Yes Historical Provider, MD    Physical Exam: Vitals:   08/24/16 1631 08/24/16 1817 08/24/16 1822  BP: 133/82 119/67 119/67  Pulse: 85 66 70  Resp: 16  20  Temp: 99.9 F (37.7 C)    TempSrc: Oral    SpO2: 97% 99% 100%  Weight: 90.3 kg (199 lb)    Height: 5\' 9"  (1.753 m)        Constitutional: NAD, calm, comfortable, NONtoxic appearing Vitals:   08/24/16 1631 08/24/16 1817 08/24/16 1822  BP: 133/82 119/67 119/67  Pulse: 85 66 70  Resp: 16  20  Temp: 99.9 F (37.7 C)    TempSrc: Oral    SpO2: 97% 99% 100%  Weight: 90.3 kg (199 lb)    Height: 5\' 9"  (1.753 m)     Eyes: PERRL, lids and conjunctivae normal ENMT: Mucous membranes are moist. Posterior pharynx clear of any exudate or lesions. Normal dentition.  Neck: normal appearance, supple, no masses Respiratory: clear to auscultation bilaterally, no wheezing, no crackles. Normal  respiratory effort. No accessory muscle use.  Cardiovascular: Normal rate, regular rhythm, no murmurs / rubs / gallops. No extremity edema. 2+ pedal pulses.  GI: abdomen is soft and compressible.  No distention.  No tenderness.  Bowel sounds are present.  Ileal conduit to RLQ. Musculoskeletal:  No joint deformity in upper and lower extremities. Good ROM, no contractures. Normal muscle tone.  Skin: no rashes, warm and dry Neurologic: CN 2-12 grossly intact. Sensation intact, Strength symmetric bilaterally. Psychiatric: Normal judgment and insight. Alert and oriented x 3. Normal mood.     Labs on Admission: I have personally reviewed following labs and imaging studies  CBC:  Recent Labs Lab 08/24/16  1709  WBC 6.9  HGB 13.4  HCT 40.6  MCV 90.0  PLT [id number]   Basic Metabolic Panel:  Recent Labs Lab 08/24/16 1709  NA 135  K 4.2  CL 102  CO2 23  GLUCOSE 80  BUN 30*  CREATININE 2.49*  CALCIUM 8.6*   GFR: Estimated Creatinine Clearance: 31.1 mL/min (by C-G formula based on SCr of 2.49 mg/dL (H)). Liver Function Tests:  Recent Labs Lab 08/24/16 1800  AST 33  ALT 20  ALKPHOS 86  BILITOT 0.7  PROT 7.6  ALBUMIN 3.7   Urine analysis:    Component Value Date/Time   COLORURINE YELLOW 08/24/2016 1650   APPEARANCEUR CLOUDY (A) 08/24/2016 1650   LABSPEC 1.012 08/24/2016 1650   LABSPEC 1.010 01/18/2014 1328   PHURINE 6.0 08/24/2016 1650   GLUCOSEU NEGATIVE 08/24/2016 1650   GLUCOSEU Negative 01/18/2014 1328   HGBUR MODERATE (A) 08/24/2016 1650   BILIRUBINUR NEGATIVE 08/24/2016 1650   BILIRUBINUR Negative 01/18/2014 1328   KETONESUR NEGATIVE 08/24/2016 1650   PROTEINUR 100 (A) 08/24/2016 1650   UROBILINOGEN 0.2 04/13/2015 2101   UROBILINOGEN 0.2 01/18/2014 1328   NITRITE NEGATIVE 08/24/2016 1650   LEUKOCYTESUR LARGE (A) 08/24/2016 1650   LEUKOCYTESUR Large 01/18/2014 1328   Sepsis Labs:  Lactic acid level 2.33  Radiological Exams on Admission: Dg Chest 2  View  Result Date: 08/24/2016 CLINICAL DATA:  Fatigue and fever. EXAM: CHEST  2 VIEW COMPARISON:  06/05/2016 FINDINGS: The lungs are clear wiithout focal pneumonia, edema, pneumothorax or pleural effusion. Granuloma identified left costophrenic angle, as confirmed by CT of 04/12/2015. The cardiopericardial silhouette is within normal limits for size. The visualized bony structures of the thorax are intact. IMPRESSION: Stable.  No acute findings. Electronically Signed   By: Misty Stanley M.D.   On: 08/24/2016 17:59    Assessment/Plan Principal Problem:   UTI (urinary tract infection) Active Problems:   Hyperlipidemia   Bladder cancer (HCC)   Fever   History of nephrectomy   CKD (chronic kidney disease) stage 3, GFR 30-59 ml/min   Flu-like symptoms      UTI causing mild sepsis --IV Rocephin --Blood and urine cultures pending --Hydrate with NS --Repeat BMP in the AM  Flu-like symptoms --Flu scree pending --Droplet precautions for now  CKD 3 --Creatinine near baseline, follow trend  HLD --Zetia  Gout --Uloric  RLS --Requip  Depression --Lexapro, klonopin --Trazodone qHS prn for sleep  GERD --PPI   DVT prophylaxis: Low risk, outpatient status Code Status: FULL Family Communication: Patient alone in the ED at time of admission. Disposition Plan: Expect he will go home at discharge. Consults called: NONE Admission status: Place in observation.  Med surg.   TIME SPENT: 60 minutes   Eber Jones MD [facility] Pager [phone]  If 7PM-7AM, please contact night-coverage [email or web address] Password [facility]  08/24/2016, 6:47 PM

## 2016-08-24 NOTE — ED Notes (Signed)
1 set of Blood Cultures drawn

## 2016-08-24 NOTE — ED Provider Notes (Signed)
[facility] DEPT Provider Note   CSN: [id number] Arrival date & time: 08/24/16  1628     History   Chief Complaint Chief Complaint  Patient presents with  . Fatigue    HPI [name] is a 71 y.o. male.  The history is provided by the patient.  Fever   This is a new problem. The current episode started 2 days ago. The problem occurs constantly. The problem has not changed since onset.The maximum temperature noted was 101 to 101.9 F. Pertinent negatives include no vomiting, no congestion, no headaches, no muscle aches and no cough. Associated symptoms comments: Fatigue and change in urine color in urostomy . He has tried acetaminophen for the symptoms. The treatment provided mild relief.   Hydrating appropriately.  H/o bladder cancer s/p resection of bladder, prostate, and left kidney. S/p chemo. In remission for 2.5 yrs.   Past Medical History:  Diagnosis Date  . Allergy   . Bacteremia due to Gram-negative bacteria 04/13/2015  . Bladder rupture 2010  . Bladder tumor   . Cancer ([place])    bladder  . Depression   . Gout   . 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 Hyperlipidemia   . Restless leg syndrome, controlled     Patient Active Problem List   Diagnosis Date Noted  . UTI (urinary tract infection) 08/24/2016  . Fever 08/24/2016  . History of nephrectomy 08/24/2016  . Sepsis due to Escherichia coli (E. coli) ([place]) 04/14/2015  . Bacteremia due to Gram-negative bacteria ([place]), E. Coli 04/14/2015  . UTI (lower urinary tract infection) 04/13/2015  . Acute renal failure superimposed on stage 3 chronic kidney disease ([place]) 04/13/2015  . Anemia of chronic kidney failure 04/13/2015  . Acute hyponatremia 04/13/2015  . Bladder cancer ([place]) 06/23/2013  . Hyperlipidemia   . GERD (gastroesophageal reflux disease) 12/22/2012    Past Surgical History:  Procedure Laterality Date  . APPENDECTOMY    . BLADDER SURGERY  2010  . CYSTOSCOPY W/ RETROGRADES  Bilateral 04/21/2013   Procedure: CYSTOSCOPY WITH RETROGRADE PYELOGRAM;  Surgeon: Molli Hazard, MD;  Location: [facility];  Service: Urology;  Laterality: Bilateral;  . FRACTURE SURGERY Right 01/05/2005   lower leg/ankle  . NASAL SEPTUM SURGERY    . TONSILLECTOMY AND ADENOIDECTOMY    . TRANSURETHRAL RESECTION OF BLADDER TUMOR WITH GYRUS (TURBT-GYRUS) N/A 04/21/2013   Procedure: TRANSURETHRAL RESECTION OF BLADDER TUMOR WITH GYRUS (TURBT-GYRUS), cystoscopy;  Surgeon: Molli Hazard, MD;  Location: [facility];  Service: Urology;  Laterality: N/A;       Home Medications    Prior to Admission medications   Medication Sig Start Date End Date Taking? Authorizing Provider  acetaminophen (TYLENOL) 500 MG tablet Take 1,000 mg by mouth every 6 (six) hours as needed for mild pain, moderate pain or headache.    Yes Historical Provider, MD  aspirin EC 81 MG tablet Take 81 mg by mouth at bedtime.   Yes Historical Provider, MD  clonazePAM (KLONOPIN) 0.5 MG tablet Take 0.5 mg by mouth 2 (two) times daily.   Yes Historical Provider, MD  colchicine 0.6 MG tablet Take 0.6-1.2 mg by mouth 2 (two) times daily as needed (for gout flare).    Yes Historical Provider, MD  escitalopram (LEXAPRO) 20 MG tablet Take 40 mg by mouth at bedtime.    Yes Historical Provider, MD  ezetimibe (ZETIA) 10 MG tablet Take 10 mg by mouth daily.   Yes Historical Provider, MD  febuxostat Melburn Popper)  40 MG tablet Take 40 mg by mouth daily.   Yes Historical Provider, MD  fluticasone (FLONASE) 50 MCG/ACT nasal spray Place 2 sprays into both nostrils daily.   Yes Historical Provider, MD  omeprazole (PRILOSEC) 20 MG capsule Take 1 capsule (20 mg total) by mouth 2 (two) times daily before a meal. 03/06/16  Yes [name], MD  rOPINIRole (REQUIP) 3 MG tablet Take 3 mg by mouth at bedtime.   Yes Historical Provider, MD  traZODone (DESYREL) 100 MG tablet Take 50 mg by mouth daily as needed for sleep.    Yes Historical Provider, MD     Family History Family History  Problem Relation Age of Onset  . Cancer Mother     ovarian  . Cirrhosis Father   . Alcohol abuse Father     Social History Social History  Substance Use Topics  . Smoking status: Former Smoker    Packs/day: 0.50    Years: 30.00    Types: Cigarettes    Quit date: 07/24/1989  . Smokeless tobacco: Never Used  . Alcohol use Yes     Comment: social     Allergies   Augmentin [amoxicillin-pot clavulanate] and Statins   Review of Systems Review of Systems  Constitutional: Positive for fever.  HENT: Negative for congestion.   Respiratory: Negative for cough.   Gastrointestinal: Negative for vomiting.  Neurological: Negative for headaches.  Ten systems are reviewed and are negative for acute change except as noted in the HPI    Physical Exam Updated Vital Signs BP 133/82 (BP Location: Left Arm)   Pulse 85   Temp 99.9 F (37.7 C) (Oral)   Resp 16   Ht 5\' 9"  (1.753 m)   Wt 199 lb (90.3 kg)   SpO2 97%   BMI 29.39 kg/m   Physical Exam  Constitutional: He is oriented to person, place, and time. He appears well-developed and well-nourished. No distress.  HENT:  Head: Normocephalic and atraumatic.  Nose: Nose normal.  Eyes: Conjunctivae and EOM are normal. Pupils are equal, round, and reactive to light. Right eye exhibits no discharge. Left eye exhibits no discharge. No scleral icterus.  Neck: Normal range of motion. Neck supple.  Cardiovascular: Normal rate and regular rhythm.  Exam reveals no gallop and no friction rub.   No murmur heard. Pulmonary/Chest: Effort normal and breath sounds normal. No stridor. No respiratory distress. He has no rales.  Abdominal: Soft. He exhibits no distension. There is no tenderness. There is no rigidity, no rebound, no guarding and no CVA tenderness.    Musculoskeletal: He exhibits no edema or tenderness.  Neurological: He is alert and oriented to person, place, and time.  Skin: Skin is warm and  dry. No rash noted. He is not diaphoretic. No erythema.  Psychiatric: He has a normal mood and affect.  Vitals reviewed.    ED Treatments / Results  Labs (all labs ordered are listed, but only abnormal results are displayed) Labs Reviewed  URINALYSIS, ROUTINE W REFLEX MICROSCOPIC - Abnormal; Notable for the following:       Result Value   APPearance CLOUDY (*)    Hgb urine dipstick MODERATE (*)    Protein, ur 100 (*)    Leukocytes, UA LARGE (*)    Bacteria, UA MANY (*)    Non Squamous Epithelial 0-5 (*)    All other components within normal limits  BASIC METABOLIC PANEL - Abnormal; Notable for the following:    BUN 30 (*)  Creatinine, Ser 2.49 (*)    Calcium 8.6 (*)    GFR calc non Af Amer 25 (*)    GFR calc Af Amer 29 (*)    All other components within normal limits  I-STAT CG4 LACTIC ACID, ED - Abnormal; Notable for the following:    Lactic Acid, Venous 2.33 (*)    All other components within normal limits  URINE CULTURE  CULTURE, BLOOD (ROUTINE X 2)  CULTURE, BLOOD (ROUTINE X 2)  CBC  HEPATIC FUNCTION PANEL  INFLUENZA PANEL BY PCR (TYPE A & B)    EKG  EKG Interpretation None       Radiology Dg Chest 2 View  Result Date: 08/24/2016 CLINICAL DATA:  Fatigue and fever. EXAM: CHEST  2 VIEW COMPARISON:  06/05/2016 FINDINGS: The lungs are clear wiithout focal pneumonia, edema, pneumothorax or pleural effusion. Granuloma identified left costophrenic angle, as confirmed by CT of 04/12/2015. The cardiopericardial silhouette is within normal limits for size. The visualized bony structures of the thorax are intact. IMPRESSION: Stable.  No acute findings. Electronically Signed   By: Misty Stanley M.D.   On: 08/24/2016 17:59    Procedures Procedures (including critical care time)  Medications Ordered in ED Medications  cefTRIAXone (ROCEPHIN) 1 g in dextrose 5 % 50 mL IVPB (1 g Intravenous New Bag/Given 08/24/16 1816)  sodium chloride 0.9 % bolus 1,000 mL (1,000 mLs  Intravenous New Bag/Given 08/24/16 1816)     Initial Impression / Assessment and Plan / ED Course  I have reviewed the triage vital signs and the nursing notes.  Pertinent labs & imaging results that were available during my care of the patient were reviewed by me and considered in my medical decision making (see chart for details).  Clinical Course as of Aug 24 1842  Sat Aug 24, 2016  1715 Concern for urinary source of fever given significant urologic history. No respiratory, GI, or meningeal symptoms.   [PC]  [id number] Labs suggestive of sepsis. Code sepsis initiated. Systolic blood pressure greater than 90, lactic acid less than 4, patient does not meet septic shock criteria and does not require 30 mL/kg of IV fluids. Empiric antibiotics were initiated. 1 L of IV fluid was started. UA with many bacteria however patient does have ileal conduit, will send for cultures. We'll also send flu and assess for other sources.  [PC]  [id number] Admitted to Hospitalist for continued work up and management.  [PC]    Clinical Course User Index [PC] Fatima Blank, MD      Final Clinical Impressions(s) / ED Diagnoses   Final diagnoses:  Fever, unspecified fever cause  Myalgia  Bacteriuria  Elevated lactic acid level      Fatima Blank, MD 08/24/16 1844

## 2016-08-24 NOTE — Progress Notes (Signed)
Pharmacy Antibiotic Note  [name] is a 71 y.o. male admitted on 08/24/2016 with UTI.  Pharmacy has been consulted for ceftriaxone dosing.  Plan:  Ceftriaxone 2gm IV q24h, starting 24h after ED dose  Does not required renal dose adjustment, pharmacy will sign-off  Height: 5\' 9"  (175.3 cm) Weight: 202 lb (91.6 kg) IBW/kg (Calculated) : 70.7  Temp (24hrs), Avg:99.9 F (37.7 C), Min:99.9 F (37.7 C), Max:99.9 F (37.7 C)   Recent Labs Lab 08/24/16 1709 08/24/16 1721  WBC 6.9  --   CREATININE 2.49*  --   LATICACIDVEN  --  2.33*    Estimated Creatinine Clearance: 31.3 mL/min (by C-G formula based on SCr of 2.49 mg/dL (H)).    Allergies  Allergen Reactions  . Augmentin [Amoxicillin-Pot Clavulanate] Nausea And Vomiting and Other (See Comments)    Has patient had a PCN reaction causing immediate rash, facial/tongue/throat swelling, SOB or lightheadedness with hypotension: No Has patient had a PCN reaction causing severe rash involving mucus membranes or skin necrosis: No Has patient had a PCN reaction that required hospitalization No Has patient had a PCN reaction occurring within the last 10 years: No If all of the above answers are "NO", then may proceed with Cephalosporin use.  . Statins Other (See Comments)    Reaction:  Muscle pain     Antimicrobials this admission: 2/17 ceftriaxone >>  Dose adjustments this admission:  Microbiology results: 2/17 Bcx: 2/17 UCx:   Thank you for allowing pharmacy to be a part of this patient's care.  Clovis Riley 08/24/2016 7:06 PM

## 2016-08-24 NOTE — ED Notes (Signed)
Pt has a urostomy. Urine sample collected from clean bag.

## 2016-08-24 NOTE — ED Triage Notes (Signed)
Pt complaint of fatigue, generalized aches, fever, and cloudy urine onset Thursday.

## 2016-08-24 NOTE — Progress Notes (Signed)
Patient arrived to unit at around 1900. Alert and oriented x 4 . Denies pain.Urostomy bag inplace, draining cloudy urine. Vital signs obtained. Oriented to room and environment. Endorsed to night nurse Kristine.

## 2016-08-25 ENCOUNTER — Encounter (HOSPITAL_COMMUNITY): Payer: Self-pay | Admitting: *Deleted

## 2016-08-25 ENCOUNTER — Observation Stay (HOSPITAL_COMMUNITY): Payer: Medicare Other

## 2016-08-25 DIAGNOSIS — N390 Urinary tract infection, site not specified: Secondary | ICD-10-CM | POA: Diagnosis not present

## 2016-08-25 DIAGNOSIS — Z936 Other artificial openings of urinary tract status: Secondary | ICD-10-CM | POA: Diagnosis not present

## 2016-08-25 DIAGNOSIS — Z9221 Personal history of antineoplastic chemotherapy: Secondary | ICD-10-CM | POA: Diagnosis not present

## 2016-08-25 DIAGNOSIS — N1339 Other hydronephrosis: Secondary | ICD-10-CM | POA: Diagnosis present

## 2016-08-25 DIAGNOSIS — N179 Acute kidney failure, unspecified: Secondary | ICD-10-CM

## 2016-08-25 DIAGNOSIS — N183 Chronic kidney disease, stage 3 (moderate): Secondary | ICD-10-CM | POA: Diagnosis not present

## 2016-08-25 DIAGNOSIS — C679 Malignant neoplasm of bladder, unspecified: Secondary | ICD-10-CM | POA: Diagnosis not present

## 2016-08-25 DIAGNOSIS — F329 Major depressive disorder, single episode, unspecified: Secondary | ICD-10-CM | POA: Diagnosis not present

## 2016-08-25 DIAGNOSIS — A419 Sepsis, unspecified organism: Principal | ICD-10-CM

## 2016-08-25 DIAGNOSIS — Z7982 Long term (current) use of aspirin: Secondary | ICD-10-CM | POA: Diagnosis not present

## 2016-08-25 DIAGNOSIS — Z905 Acquired absence of kidney: Secondary | ICD-10-CM | POA: Diagnosis not present

## 2016-08-25 DIAGNOSIS — B9620 Unspecified Escherichia coli [E. coli] as the cause of diseases classified elsewhere: Secondary | ICD-10-CM | POA: Diagnosis present

## 2016-08-25 DIAGNOSIS — Z881 Allergy status to other antibiotic agents status: Secondary | ICD-10-CM | POA: Diagnosis not present

## 2016-08-25 DIAGNOSIS — Z8551 Personal history of malignant neoplasm of bladder: Secondary | ICD-10-CM | POA: Diagnosis not present

## 2016-08-25 DIAGNOSIS — K219 Gastro-esophageal reflux disease without esophagitis: Secondary | ICD-10-CM | POA: Diagnosis present

## 2016-08-25 DIAGNOSIS — E785 Hyperlipidemia, unspecified: Secondary | ICD-10-CM | POA: Diagnosis present

## 2016-08-25 DIAGNOSIS — Z906 Acquired absence of other parts of urinary tract: Secondary | ICD-10-CM | POA: Diagnosis not present

## 2016-08-25 DIAGNOSIS — Z7951 Long term (current) use of inhaled steroids: Secondary | ICD-10-CM | POA: Diagnosis not present

## 2016-08-25 DIAGNOSIS — G2581 Restless legs syndrome: Secondary | ICD-10-CM | POA: Diagnosis present

## 2016-08-25 DIAGNOSIS — N200 Calculus of kidney: Secondary | ICD-10-CM | POA: Diagnosis present

## 2016-08-25 DIAGNOSIS — Z932 Ileostomy status: Secondary | ICD-10-CM | POA: Diagnosis not present

## 2016-08-25 DIAGNOSIS — M109 Gout, unspecified: Secondary | ICD-10-CM | POA: Diagnosis present

## 2016-08-25 DIAGNOSIS — Z888 Allergy status to other drugs, medicaments and biological substances status: Secondary | ICD-10-CM | POA: Diagnosis not present

## 2016-08-25 DIAGNOSIS — R5383 Other fatigue: Secondary | ICD-10-CM | POA: Diagnosis not present

## 2016-08-25 LAB — BASIC METABOLIC PANEL
Anion gap: 4 — ABNORMAL LOW (ref 5–15)
BUN: 33 mg/dL — AB (ref 6–20)
CHLORIDE: 110 mmol/L (ref 101–111)
CO2: 25 mmol/L (ref 22–32)
Calcium: 8 mg/dL — ABNORMAL LOW (ref 8.9–10.3)
Creatinine, Ser: 2.52 mg/dL — ABNORMAL HIGH (ref 0.61–1.24)
GFR calc Af Amer: 28 mL/min — ABNORMAL LOW (ref >60)
GFR calc non Af Amer: 24 mL/min — ABNORMAL LOW (ref >60)
Glucose, Bld: 121 mg/dL — ABNORMAL HIGH (ref 65–99)
POTASSIUM: 4.2 mmol/L (ref 3.5–5.1)
SODIUM: 139 mmol/L (ref 135–145)

## 2016-08-25 NOTE — Care Management Obs Status (Signed)
[facility] NOTIFICATION   Patient Details  Name: [name] MRN: [id number] Date of Birth: [birth year]   Medicare Observation Status Notification Given:  Yes    Erenest Rasher, RN 08/25/2016, 10:21 AM

## 2016-08-25 NOTE — Progress Notes (Signed)
Sent text page to Dr. Cathlean Sauer on the results of US renal.

## 2016-08-25 NOTE — Progress Notes (Signed)
PROGRESS NOTE    [name]  [id number] DOB: 1945/10/20 DOA: 08/24/2016 PCP: [name], MD    Brief Narrative:  71 yo male with history of bladder cancer sp cystectomy and ileal conduit and left nephrectomy presents with fever, malaise and cloudy urine. On the physical examination, he had sepsis features, urine with TNT white cells. Admitted for IV antibiotics for complicated urine infection.    Assessment & Plan:   Principal Problem:   UTI (urinary tract infection) Active Problems:   Hyperlipidemia   Bladder cancer (HCC)   Fever   History of nephrectomy   CKD (chronic kidney disease) stage 3, GFR 30-59 ml/min   Flu-like symptoms   1. Sepsis due to complicated urine infection, present on admission. Will continue IV antibiotic therapy with ceftriaxone, will reduce rate if IV fluids to avoid volume overload, will follow a restrictive IV fluids strategy. Follow on cultures, cell count and temperature curve. Will check renal US to rule out obstructive uropathy.   2. CKD stage 3. Renal function with stable cr 2,4 and 2,5. Will continue to follow on renal function in am, will continue IV fluids, with isotonic saline.   3. Depression. No agitation or confusion, will continue clonazepam, lexapro, ropinarole and trazodone.   4. GERD. Continue pantoprazole. Tolerating po well.    DVT prophylaxis: scd  Code Status: full  Family Communication: No family at the bedside  Disposition Plan: home   Consultants:     Procedures:    Antimicrobials:   ceftriaxone     Subjective: Patient feeling better, no pain, no nausea or vomiting, positive fever last night. Had urine infection in the past, requiring hospitalization.   Objective: Vitals:   08/24/16 1900 08/24/16 2200 08/24/16 2359 08/25/16 0429  BP: (!) 126/99 (!) 108/58  (!) 105/57  Pulse: 66   (!) 56  Resp: 15 20  16   Temp: 99.9 F (37.7 C) (!) 102.8 F (39.3 C) 98.7 F (37.1 C) 98.4 F (36.9 C)  TempSrc:  Oral Oral Oral Oral  SpO2: 100% 97%  98%  Weight: 91.6 kg (202 lb)     Height:        Intake/Output Summary (Last 24 hours) at 08/25/16 1337 Last data filed at 08/25/16 1021  Gross per 24 hour  Intake             1800 ml  Output             1300 ml  Net              500 ml   Filed Weights   08/24/16 1631 08/24/16 1900  Weight: 90.3 kg (199 lb) 91.6 kg (202 lb)    Examination:  General exam: deconditioned E ENT: No pallor or icterus, oral mucosa moist Respiratory system: Clear to auscultation. Respiratory effort normal. Cardiovascular system: S1 & S2 heard, RRR. No JVD, murmurs, rubs, gallops or clicks. No pedal edema. Gastrointestinal system: Abdomen is nondistended, soft and nontender. No organomegaly or masses felt. Normal bowel sounds heard. Ostomy bag in place with clear urine.  Central nervous system: Alert and oriented. No focal neurological deficits. Extremities: Symmetric 5 x 5 power. Skin: No rashes, lesions or ulcers  Data Reviewed: I have personally reviewed following labs and imaging studies  CBC:  Recent Labs Lab 08/24/16 1709  WBC 6.9  HGB 13.4  HCT 40.6  MCV 90.0  PLT [id number]   Basic Metabolic Panel:  Recent Labs Lab 08/24/16 1709 08/25/16 0422  NA  135 139  K 4.2 4.2  CL 102 110  CO2 23 25  GLUCOSE 80 121*  BUN 30* 33*  CREATININE 2.49* 2.52*  CALCIUM 8.6* 8.0*   GFR: Estimated Creatinine Clearance: 31 mL/min (by C-G formula based on SCr of 2.52 mg/dL (H)). Liver Function Tests:  Recent Labs Lab 08/24/16 1800  AST 33  ALT 20  ALKPHOS 86  BILITOT 0.7  PROT 7.6  ALBUMIN 3.7   No results for input(s): LIPASE, AMYLASE in the last 168 hours. No results for input(s): AMMONIA in the last 168 hours. Coagulation Profile: No results for input(s): INR, PROTIME in the last 168 hours. Cardiac Enzymes: No results for input(s): CKTOTAL, CKMB, CKMBINDEX, TROPONINI in the last 168 hours. BNP (last 3 results) No results for input(s): PROBNP in  the last 8760 hours. HbA1C: No results for input(s): HGBA1C in the last 72 hours. CBG: No results for input(s): GLUCAP in the last 168 hours. Lipid Profile: No results for input(s): CHOL, HDL, LDLCALC, TRIG, CHOLHDL, LDLDIRECT in the last 72 hours. Thyroid Function Tests: No results for input(s): TSH, T4TOTAL, FREET4, T3FREE, THYROIDAB in the last 72 hours. Anemia Panel: No results for input(s): VITAMINB12, FOLATE, FERRITIN, TIBC, IRON, RETICCTPCT in the last 72 hours. Sepsis Labs:  Recent Labs Lab 08/24/16 1721 08/24/16 2041  LATICACIDVEN 2.33* 1.4    Recent Results (from the past 240 hour(s))  Blood Culture (routine x 2)     Status: None (Preliminary result)   Collection Time: 08/24/16  5:48 PM  Result Value Ref Range Status   Specimen Description BLOOD RIGHT ANTECUBITAL  Final   Special Requests BOTTLES DRAWN AEROBIC AND ANAEROBIC 5CC  Final   Culture PENDING  Incomplete   Report Status PENDING  Incomplete         Radiology Studies: Dg Chest 2 View  Result Date: 08/24/2016 CLINICAL DATA:  Fatigue and fever. EXAM: CHEST  2 VIEW COMPARISON:  06/05/2016 FINDINGS: The lungs are clear wiithout focal pneumonia, edema, pneumothorax or pleural effusion. Granuloma identified left costophrenic angle, as confirmed by CT of 04/12/2015. The cardiopericardial silhouette is within normal limits for size. The visualized bony structures of the thorax are intact. IMPRESSION: Stable.  No acute findings. Electronically Signed   By: Misty Stanley M.D.   On: 08/24/2016 17:59        Scheduled Meds: . aspirin EC  81 mg Oral QHS  . cefTRIAXone (ROCEPHIN)  IV  2 g Intravenous Q24H  . clonazePAM  0.5 mg Oral BID  . escitalopram  40 mg Oral QHS  . ezetimibe  10 mg Oral Daily  . febuxostat  40 mg Oral Daily  . pantoprazole  40 mg Oral Daily  . rOPINIRole  3 mg Oral QHS   Continuous Infusions: . sodium chloride 100 mL/hr at 08/24/16 2229     LOS: 0 days       Tawni Millers, MD [facility] Pager [phone]  If 7PM-7AM, please contact night-coverage [email or web address] Password [email or web address] 08/25/2016, 1:37 PM

## 2016-08-25 NOTE — Care Management Note (Signed)
Case Management Note  Patient Details  Name: [name] MRN: [id number] Date of Birth: Sep 23, 1945  Subjective/Objective:        UTI, flu-like symptoms            Action/Plan: Discharge Planning: NCM spoke to pt and lives at home with wife. Wife at home to assist with care as needed. Pt states he was independent prior to hospital stay. No NCM needs identified  PCP Jenna Luo T MD  Expected Discharge Date:  08/28/16               Expected Discharge Plan:  Home/Self Care  In-House Referral:  NA  Discharge planning Services  CM Consult  Post Acute Care Choice:  NA Choice offered to:  NA  DME Arranged:  N/A DME Agency:  NA  HH Arranged:  NA HH Agency:  NA  Status of Service:  Completed, signed off  If discussed at [place] of Stay Meetings, dates discussed:    Additional Comments:  Erenest Rasher, RN 08/25/2016, 10:19 AM

## 2016-08-25 NOTE — Progress Notes (Signed)
Pt's valuables are locked up in the hospital safe. $55.77 , silver and gold watch, silver necklace, two silver rings and a brown wallet items placed in envelop witnessed by Wilson NT will continue to monitor.

## 2016-08-26 LAB — BASIC METABOLIC PANEL
Anion gap: 5 (ref 5–15)
BUN: 30 mg/dL — AB (ref 6–20)
CHLORIDE: 113 mmol/L — AB (ref 101–111)
CO2: 22 mmol/L (ref 22–32)
Calcium: 8.2 mg/dL — ABNORMAL LOW (ref 8.9–10.3)
Creatinine, Ser: 2.11 mg/dL — ABNORMAL HIGH (ref 0.61–1.24)
GFR calc Af Amer: 35 mL/min — ABNORMAL LOW (ref >60)
GFR calc non Af Amer: 30 mL/min — ABNORMAL LOW (ref >60)
Glucose, Bld: 112 mg/dL — ABNORMAL HIGH (ref 65–99)
POTASSIUM: 4.6 mmol/L (ref 3.5–5.1)
SODIUM: 140 mmol/L (ref 135–145)

## 2016-08-26 LAB — CBC WITH DIFFERENTIAL/PLATELET
Basophils Absolute: 0 10*3/uL (ref 0.0–0.1)
Basophils Relative: 0 %
EOS ABS: 0.2 10*3/uL (ref 0.0–0.7)
Eosinophils Relative: 3 %
HEMATOCRIT: 35.5 % — AB (ref 39.0–52.0)
HEMOGLOBIN: 11.6 g/dL — AB (ref 13.0–17.0)
LYMPHS ABS: 1.6 10*3/uL (ref 0.7–4.0)
Lymphocytes Relative: 28 %
MCH: 29.2 pg (ref 26.0–34.0)
MCHC: 32.7 g/dL (ref 30.0–36.0)
MCV: 89.4 fL (ref 78.0–100.0)
MONOS PCT: 8 %
Monocytes Absolute: 0.5 10*3/uL (ref 0.1–1.0)
NEUTROS ABS: 3.4 10*3/uL (ref 1.7–7.7)
NEUTROS PCT: 61 %
Platelets: 175 10*3/uL (ref 150–400)
RBC: 3.97 MIL/uL — AB (ref 4.22–5.81)
RDW: 13.5 % (ref 11.5–15.5)
WBC: 5.6 10*3/uL (ref 4.0–10.5)

## 2016-08-26 MED ORDER — CIPROFLOXACIN HCL 250 MG PO TABS: 250.0000 mg | ORAL_TABLET | Freq: Two times a day (BID) | ORAL | 0 refills | Status: AC

## 2016-08-26 MED ORDER — ACETAMINOPHEN 500 MG PO TABS: 500.0000 mg | ORAL_TABLET | Freq: Four times a day (QID) | ORAL | 0 refills | Status: DC | PRN

## 2016-08-26 NOTE — Progress Notes (Signed)
Nursing Discharge Summary  Patient ID: SONJA STIEG MRN: [id number] DOB/AGE: Nov 25, 1945 71 y.o.  Admit date: 08/24/2016 Discharge date: 08/26/2016  Discharged Condition: good  Disposition: 01-Home or Self Care    Prescriptions Given: Prescriptions for Cipro called into patient's pharmacy.  Follow up appointments and medications discussed with patient.  Patient verbalized understanding without further questions.    Means of Discharge: Patient to be taken downstairs via wheelchair to be discharged home.  Patient stated that he received his belongings from security.   Signed: Buel Ream 08/26/2016, 12:01 PM

## 2016-08-26 NOTE — Discharge Summary (Addendum)
Physician Discharge Summary  [name] [id number] DOB: 10-01-1945 DOA: 08/24/2016  PCP: [name], MD  Admit date: 08/24/2016 Discharge date: 08/26/2016  Admitted From: Home  Disposition:  Home   Recommendations for Outpatient Follow-up:  1. Follow up with PCP in 1- weeks 2. Urine culture positive for gram negative rods, pending identification 3. Patient discharged on ciprofloxacin 4. Follow up with Urology, for right hydronephrosis, suspected to be chronic.   Home Health: NO  Equipment/Devices: NA  Discharge Condition: Stable  CODE STATUS: Full  Diet recommendation: Regular   Brief/Interim Summary: This is a 71 year old male who presented to the hospital with the chief complain fever, malaise, body aches and cloudy urine. The patient has history of bladder cancer status post cystectomy and ileal conduit placement. Status post left nephrectomy. His symptoms started 48 hours before admission they were associated with fever. Due to persistent symptoms he presented to hospital for further evaluation. On initial physical examination blood pressure 133/82, heart rate 85, respiratory rate 16, temperature 99.9. Mucous members were moist, lungs were clear to auscultation, heart S1-S2 present rhythmic, abdomen was soft nontender, ostomy bag in place. Lower extremities with no edema. Sodium 135, potassium 4.2, chloride 102, bicarbonate 23, glucose 80, BUN 30, creatinine 2.49, white count 6.9, hemoglobin 13.4, hematocrit 40.6 lactic acid 2.3, influenza A and B were negative, urine analysis with it too numerous to count white cells, large leukocyte esterase. His chest x-ray was negative for infiltrates.   The patient was admitted to the hospital with a working diagnosis of sepsis due to complicated urinary tract infection. Acute kidney injury on chronic kidney disease.   1. Sepsis due to complicated (escherichia coli, sensitive to ciprofloxacin) urinary tract infection (present on  admission). Patient reported fever at home, suspected non-SIRS type sepsis. He was placed on IV fluids and antibiotic therapy with ceftriaxone. Patient responded well to the antibiotic therapy with significant improvement of his symptoms, urine culture positive for gram-negative rods, identification still pending. Urine culture from June 2017 positive for Klebsiella, sensitive to fluoroquinolones. Patient will be discharged on ciprofloxacin, will follow-up on final report on urine culture.  2. Acute kidney injury on chronic kidney disease stage III. Baseline creatinine around 2.1, peak creatinine 2.5 in the hospital, patient received IV fluids with improvement of kidney function, discharge creatinine back to baseline. Platelets within normal ranges.   3. History of Bladder cancer with right hydronephrosis. Renal ultrasound showed mild to moderate hydronephrosis on the right and findings suggestive of 4 mm stone interpolar region right kidney. Case was discussed with urology over the phone. Dr. Tresa Moore. CT scan from 2014 reported mild to moderate hydronephrosis on the right. This changes are presumed to be chronic related to his ileal conduit surgery. Patient's kidney function improved, urine is clear and appropriate amounts. Plan to follow-up as an outpatient with urology as scheduled.   4. Depression. No agitation or confusion, patient was continued on clonazepam, Lexapro, ropinirole and trazodone.   5. GERD. Patient was continued on proton pump inhibitor. Tolerating by mouth diet adequately.   * Late entry: urine culture positive for E coli, sensitive to ciprofloxacin.   Discharge Diagnoses:  Principal Problem:   UTI (urinary tract infection) Active Problems:   Hyperlipidemia   Bladder cancer (HCC)   Fever   History of nephrectomy   CKD (chronic kidney disease) stage 3, GFR 30-59 ml/min   Flu-like symptoms    Discharge Instructions   Allergies as of 08/26/2016      Reactions  Augmentin  [amoxicillin-pot Clavulanate] Nausea And Vomiting, Other (See Comments)   Has patient had a PCN reaction causing immediate rash, facial/tongue/throat swelling, SOB or lightheadedness with hypotension: No Has patient had a PCN reaction causing severe rash involving mucus membranes or skin necrosis: No Has patient had a PCN reaction that required hospitalization No Has patient had a PCN reaction occurring within the last 10 years: No If all of the above answers are "NO", then may proceed with Cephalosporin use.   Statins Other (See Comments)   Reaction:  Muscle pain       Medication List    TAKE these medications   acetaminophen 500 MG tablet Commonly known as:  TYLENOL Take 1 tablet (500 mg total) by mouth every 6 (six) hours as needed for mild pain, moderate pain or headache. What changed:  how much to take   aspirin EC 81 MG tablet Take 81 mg by mouth at bedtime.   ciprofloxacin 250 MG tablet Commonly known as:  CIPRO Take 1 tablet (250 mg total) by mouth 2 (two) times daily.   clonazePAM 0.5 MG tablet Commonly known as:  KLONOPIN Take 0.5 mg by mouth 2 (two) times daily.   colchicine 0.6 MG tablet Take 0.6-1.2 mg by mouth 2 (two) times daily as needed (for gout flare).   escitalopram 20 MG tablet Commonly known as:  LEXAPRO Take 40 mg by mouth at bedtime.   ezetimibe 10 MG tablet Commonly known as:  ZETIA Take 10 mg by mouth daily.   febuxostat 40 MG tablet Commonly known as:  ULORIC Take 40 mg by mouth daily.   fluticasone 50 MCG/ACT nasal spray Commonly known as:  FLONASE Place 2 sprays into both nostrils daily.   omeprazole 20 MG capsule Commonly known as:  PRILOSEC Take 1 capsule (20 mg total) by mouth 2 (two) times daily before a meal.   rOPINIRole 3 MG tablet Commonly known as:  REQUIP Take 3 mg by mouth at bedtime.   traZODone 100 MG tablet Commonly known as:  DESYREL Take 50 mg by mouth daily as needed for sleep.       Allergies  Allergen  Reactions  . Augmentin [Amoxicillin-Pot Clavulanate] Nausea And Vomiting and Other (See Comments)    Has patient had a PCN reaction causing immediate rash, facial/tongue/throat swelling, SOB or lightheadedness with hypotension: No Has patient had a PCN reaction causing severe rash involving mucus membranes or skin necrosis: No Has patient had a PCN reaction that required hospitalization No Has patient had a PCN reaction occurring within the last 10 years: No If all of the above answers are "NO", then may proceed with Cephalosporin use.  . Statins Other (See Comments)    Reaction:  Muscle pain     Consultations:  Urology over the phone.    Procedures/Studies: Dg Chest 2 View  Result Date: 08/24/2016 CLINICAL DATA:  Fatigue and fever. EXAM: CHEST  2 VIEW COMPARISON:  06/05/2016 FINDINGS: The lungs are clear wiithout focal pneumonia, edema, pneumothorax or pleural effusion. Granuloma identified left costophrenic angle, as confirmed by CT of 04/12/2015. The cardiopericardial silhouette is within normal limits for size. The visualized bony structures of the thorax are intact. IMPRESSION: Stable.  No acute findings. Electronically Signed   By: Misty Stanley M.D.   On: 08/24/2016 17:59   US Renal  Result Date: 08/25/2016 CLINICAL DATA:  Patient with urinary tract infection. Prior left nephrectomy and cystectomy. EXAM: RENAL / URINARY TRACT ULTRASOUND COMPLETE COMPARISON:  Renal ultrasound 06/12/2016.  FINDINGS: Right Kidney: Length: 12.2 cm. Mild to moderate pelvicaliectasis. 4 mm echogenic focus within the interpolar region of the right kidney. Normal renal cortical thickness and echogenicity. Left Kidney: Surgically absent. Bladder: Surgically absent. IMPRESSION: Mild to moderate hydronephrosis on the right. Findings suggestive of 4 mm stone interpolar region right kidney. These results will be called to the ordering clinician or representative by the Radiologist Assistant, and communication  documented in the PACS or zVision Dashboard. Electronically Signed   By: Lovey Newcomer M.D.   On: 08/25/2016 17:20    (Echo, Carotid, EGD, Colonoscopy, ERCP)    Subjective: Patient feeling well, no nausea or vomiting, tolerating po well.   Discharge Exam: Vitals:   08/25/16 2211 08/26/16 0550  BP: 106/66 104/67  Pulse: (!) 52 (!) 58  Resp: 16 16  Temp: 98.5 F (36.9 C) 98.4 F (36.9 C)   Vitals:   08/25/16 0429 08/25/16 1353 08/25/16 2211 08/26/16 0550  BP: (!) 105/57 109/60 106/66 104/67  Pulse: (!) 56 (!) 59 (!) 52 (!) 58  Resp: 16 16 16 16   Temp: 98.4 F (36.9 C) 98.1 F (36.7 C) 98.5 F (36.9 C) 98.4 F (36.9 C)  TempSrc: Oral Oral Oral Oral  SpO2: 98% 100% 100% 98%  Weight:      Height:        General: Pt is alert, awake, not in acute distress Cardiovascular: RRR, S1/S2 +, no rubs, no gallops Respiratory: CTA bilaterally, no wheezing, no rhonchi Abdominal: Soft, NT, ND, bowel sounds +, Ostomy in place with clear urine in the bag.  Extremities: no edema, no cyanosis    The results of significant diagnostics from this hospitalization (including imaging, microbiology, ancillary and laboratory) are listed below for reference.     Microbiology: Recent Results (from the past 240 hour(s))  Urine culture     Status: Abnormal (Preliminary result)   Collection Time: 08/24/16  4:50 PM  Result Value Ref Range Status   Specimen Description URINE, RANDOM ILEOSTOMY  Final   Special Requests NONE  Final   Culture >=100,000 COLONIES/mL GRAM NEGATIVE RODS (A)  Final   Report Status PENDING  Incomplete  Blood Culture (routine x 2)     Status: None (Preliminary result)   Collection Time: 08/24/16  5:48 PM  Result Value Ref Range Status   Specimen Description BLOOD RIGHT ANTECUBITAL  Final   Special Requests BOTTLES DRAWN AEROBIC AND ANAEROBIC 5CC  Final   Culture PENDING  Incomplete   Report Status PENDING  Incomplete     Labs: BNP (last 3 results) No results for  input(s): BNP in the last 8760 hours. Basic Metabolic Panel:  Recent Labs Lab 08/24/16 1709 08/25/16 0422 08/26/16 0422  NA 135 139 140  K 4.2 4.2 4.6  CL 102 110 113*  CO2 23 25 22   GLUCOSE 80 121* 112*  BUN 30* 33* 30*  CREATININE 2.49* 2.52* 2.11*  CALCIUM 8.6* 8.0* 8.2*   Liver Function Tests:  Recent Labs Lab 08/24/16 1800  AST 33  ALT 20  ALKPHOS 86  BILITOT 0.7  PROT 7.6  ALBUMIN 3.7   No results for input(s): LIPASE, AMYLASE in the last 168 hours. No results for input(s): AMMONIA in the last 168 hours. CBC:  Recent Labs Lab 08/24/16 1709 08/26/16 0422  WBC 6.9 5.6  NEUTROABS  --  3.4  HGB 13.4 11.6*  HCT 40.6 35.5*  MCV 90.0 89.4  PLT 180 175   Cardiac Enzymes: No results for input(s): CKTOTAL,  CKMB, CKMBINDEX, TROPONINI in the last 168 hours. BNP: Invalid input(s): POCBNP CBG: No results for input(s): GLUCAP in the last 168 hours. D-Dimer No results for input(s): DDIMER in the last 72 hours. Hgb A1c No results for input(s): HGBA1C in the last 72 hours. Lipid Profile No results for input(s): CHOL, HDL, LDLCALC, TRIG, CHOLHDL, LDLDIRECT in the last 72 hours. Thyroid function studies No results for input(s): TSH, T4TOTAL, T3FREE, THYROIDAB in the last 72 hours.  Invalid input(s): FREET3 Anemia work up No results for input(s): VITAMINB12, FOLATE, FERRITIN, TIBC, IRON, RETICCTPCT in the last 72 hours. Urinalysis    Component Value Date/Time   COLORURINE YELLOW 08/24/2016 1650   APPEARANCEUR CLOUDY (A) 08/24/2016 1650   LABSPEC 1.012 08/24/2016 1650   LABSPEC 1.010 01/18/2014 1328   PHURINE 6.0 08/24/2016 1650   GLUCOSEU NEGATIVE 08/24/2016 1650   GLUCOSEU Negative 01/18/2014 1328   HGBUR MODERATE (A) 08/24/2016 1650   BILIRUBINUR NEGATIVE 08/24/2016 1650   BILIRUBINUR Negative 01/18/2014 1328   KETONESUR NEGATIVE 08/24/2016 1650   PROTEINUR 100 (A) 08/24/2016 1650   UROBILINOGEN 0.2 04/13/2015 2101   UROBILINOGEN 0.2 01/18/2014 1328    NITRITE NEGATIVE 08/24/2016 1650   LEUKOCYTESUR LARGE (A) 08/24/2016 1650   LEUKOCYTESUR Large 01/18/2014 1328   Sepsis Labs Invalid input(s): PROCALCITONIN,  WBC,  LACTICIDVEN Microbiology Recent Results (from the past 240 hour(s))  Urine culture     Status: Abnormal (Preliminary result)   Collection Time: 08/24/16  4:50 PM  Result Value Ref Range Status   Specimen Description URINE, RANDOM ILEOSTOMY  Final   Special Requests NONE  Final   Culture >=100,000 COLONIES/mL GRAM NEGATIVE RODS (A)  Final   Report Status PENDING  Incomplete  Blood Culture (routine x 2)     Status: None (Preliminary result)   Collection Time: 08/24/16  5:48 PM  Result Value Ref Range Status   Specimen Description BLOOD RIGHT ANTECUBITAL  Final   Special Requests BOTTLES DRAWN AEROBIC AND ANAEROBIC 5CC  Final   Culture PENDING  Incomplete   Report Status PENDING  Incomplete     Time coordinating discharge: 45 minutes  SIGNED:   Tawni Millers, MD  [facility] 08/26/2016, 10:42 AM Pager   If 7PM-7AM, please contact night-coverage [email or web address] Password [email or web address]

## 2016-08-27 LAB — URINE CULTURE: Culture: >=100000 — AB

## 2016-08-30 LAB — CULTURE, BLOOD (ROUTINE X 2): CULTURE: NO GROWTH

## 2016-09-18 DIAGNOSIS — F332 Major depressive disorder, recurrent severe without psychotic features: Secondary | ICD-10-CM | POA: Diagnosis not present

## 2016-09-18 DIAGNOSIS — F411 Generalized anxiety disorder: Secondary | ICD-10-CM | POA: Diagnosis not present

## 2016-12-03 DIAGNOSIS — D2372 Other benign neoplasm of skin of left lower limb, including hip: Secondary | ICD-10-CM | POA: Diagnosis not present

## 2016-12-03 DIAGNOSIS — D225 Melanocytic nevi of trunk: Secondary | ICD-10-CM | POA: Diagnosis not present

## 2016-12-03 DIAGNOSIS — L821 Other seborrheic keratosis: Secondary | ICD-10-CM | POA: Diagnosis not present

## 2016-12-03 DIAGNOSIS — D1801 Hemangioma of skin and subcutaneous tissue: Secondary | ICD-10-CM | POA: Diagnosis not present

## 2016-12-03 DIAGNOSIS — B070 Plantar wart: Secondary | ICD-10-CM | POA: Diagnosis not present

## 2016-12-03 DIAGNOSIS — L814 Other melanin hyperpigmentation: Secondary | ICD-10-CM | POA: Diagnosis not present

## 2016-12-12 DIAGNOSIS — Z8551 Personal history of malignant neoplasm of bladder: Secondary | ICD-10-CM | POA: Diagnosis not present

## 2016-12-12 DIAGNOSIS — C679 Malignant neoplasm of bladder, unspecified: Secondary | ICD-10-CM | POA: Diagnosis not present

## 2016-12-12 DIAGNOSIS — Z8546 Personal history of malignant neoplasm of prostate: Secondary | ICD-10-CM | POA: Diagnosis not present

## 2016-12-18 DIAGNOSIS — Z8551 Personal history of malignant neoplasm of bladder: Secondary | ICD-10-CM | POA: Diagnosis not present

## 2016-12-18 DIAGNOSIS — N189 Chronic kidney disease, unspecified: Secondary | ICD-10-CM | POA: Diagnosis not present

## 2016-12-18 DIAGNOSIS — Z8546 Personal history of malignant neoplasm of prostate: Secondary | ICD-10-CM | POA: Diagnosis not present

## 2016-12-25 ENCOUNTER — Telehealth: Payer: Self-pay | Admitting: Oncology

## 2016-12-25 ENCOUNTER — Ambulatory Visit (HOSPITAL_BASED_OUTPATIENT_CLINIC_OR_DEPARTMENT_OTHER): Payer: Medicare Other | Admitting: Oncology

## 2016-12-25 VITALS — BP 112/64 | HR 59 | Temp 98.0°F | Resp 20 | Ht 69.0 in | Wt 201.1 lb

## 2016-12-25 DIAGNOSIS — C679 Malignant neoplasm of bladder, unspecified: Secondary | ICD-10-CM | POA: Diagnosis not present

## 2016-12-25 DIAGNOSIS — N289 Disorder of kidney and ureter, unspecified: Secondary | ICD-10-CM | POA: Diagnosis not present

## 2016-12-25 NOTE — Progress Notes (Signed)
Hematology and Oncology Follow Up Visit  [name] [id number] 1946/05/21 71 y.o. 12/25/2016 1:56 PM Dennard Schaumann [name], MDPickard, [name], MD   Principle Diagnosis: 71 year old gentleman with muscle invasive bladder cancer diagnosed in October of 2014 presented with a T2 N0 disease.  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 He is S/P neoadjuvant chemotherapy with cisplatinum and Gemzar started on 06/11/2013. He is status post 3 cycle of chemotherapy completed in January of 2015. He underwent a cystectomy on 10/05/2013 at [facility]. The pathology revealed no residual tumor with 12 lymph nodes sampled noninvolved with malignancy. 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 The pathology did not reveal any malignancy of his left kidney.  Current therapy:  Observation and surveillance.  Interim History: Mr. Veals presents today for a followup visit by himself. Since his last visit, he reports no changes or complaints and his health. He was hospitalized briefly in February 2018 for a urinary infection which has resolved without incident. He continues to receive urological follow up locally with Dr. Jeffie Pollock without any evidence of recurrent disease. He denied any hematuria, dysuria or flank pain. He remains active and continues to attend to her activities of daily living.   He does not report any headaches, blurry vision, syncope or seizures. He has not reported any nausea or vomiting or abdominal pain. Has not reported any genitourinary complaints. He denies any chest pain shortness of breath cough or hemoptysis. He denies any any frequency urgency or hesitancy. Does not report any skeletal complaints. Is not reporting rashes or lesions. Rest of his review of systems unremarkable.  Medications: I have reviewed the patient's current medications.  Current Outpatient  Prescriptions  Medication Sig Dispense Refill  . acetaminophen (TYLENOL) 500 MG tablet Take 1 tablet (500 mg total) by mouth every 6 (six) hours as needed for mild pain, moderate pain or headache. 30 tablet 0  . aspirin EC 81 MG tablet Take 81 mg by mouth at bedtime.    . clonazePAM (KLONOPIN) 0.5 MG tablet Take 0.5 mg by mouth 2 (two) times daily.  0  . colchicine 0.6 MG tablet Take 0.6-1.2 mg by mouth 2 (two) times daily as needed (for gout flare).     [name] escitalopram (LEXAPRO) 20 MG tablet Take 40 mg by mouth at bedtime.     [name] ezetimibe (ZETIA) 10 MG tablet Take 10 mg by mouth daily.    . febuxostat (ULORIC) 40 MG tablet Take 40 mg by mouth daily.    . fluticasone (FLONASE) 50 MCG/ACT nasal spray Place 2 sprays into both nostrils daily.    [name] omeprazole (PRILOSEC) 20 MG capsule Take 1 capsule (20 mg total) by mouth 2 (two) times daily before a meal. 180 capsule 3  . rOPINIRole (REQUIP) 3 MG tablet Take 3 mg by mouth at bedtime.    . traZODone (DESYREL) 100 MG tablet Take 50 mg by mouth daily as needed for sleep.      No current facility-administered medications for this visit.      Allergies:  Allergies  Allergen Reactions  . Augmentin [Amoxicillin-Pot Clavulanate] Nausea And Vomiting and Other (See Comments)    Has patient had a PCN reaction causing immediate rash, facial/tongue/throat swelling, SOB or lightheadedness with hypotension: No Has patient had a PCN reaction causing severe rash involving mucus membranes or skin necrosis: No Has patient had a PCN reaction that  required hospitalization No Has patient had a PCN reaction occurring within the last 10 years: No If all of the above answers are "NO", then may proceed with Cephalosporin use.  . Statins Other (See Comments)    Reaction:  Muscle pain     Past Medical History, Surgical history, Social history, and Family History were reviewed and updated.    Physical Exam: Blood pressure 112/64, pulse (!) 59, temperature 98 F  (36.7 C), temperature source Oral, resp. rate 20, height 5\' 9"  (1.753 m), weight 201 lb 1.6 oz (91.2 kg), SpO2 99 %. ECOG: 0 General appearance: Well-appearing gentleman without distress. Head: Sclera are anicteric. No oral ulcers or lesions. Neck: no adenopathy Lymph nodes: Cervical, supraclavicular, and axillary nodes normal. Heart:regular rate and rhythm, S1, S2 normal, no murmur, click, rub or gallop Lung:chest clear, no wheezing, rales, normal symmetric air entry no dullness to percussion. Abdomen: soft, non-tender, without masses or organomegaly no shifting dullness or ascites. EXT:no erythema, induration, or nodules   Lab Results: Lab Results  Component Value Date   WBC 5.6 08/26/2016   HGB 11.6 (L) 08/26/2016   HCT 35.5 (L) 08/26/2016   MCV 89.4 08/26/2016   PLT 175 08/26/2016     Chemistry      Component Value Date/Time   NA 140 08/26/2016 0422   NA 138 12/26/2015 1045   K 4.6 08/26/2016 0422   K 4.8 12/26/2015 1045   CL 113 (H) 08/26/2016 0422   CO2 22 08/26/2016 0422   CO2 23 12/26/2015 1045   BUN 30 (H) 08/26/2016 0422   BUN 22.4 12/26/2015 1045   CREATININE 2.11 (H) 08/26/2016 0422   CREATININE 2.05 (H) 02/06/2016 0853   CREATININE 2.3 (H) 12/26/2015 1045      Component Value Date/Time   CALCIUM 8.2 (L) 08/26/2016 0422   CALCIUM 9.1 12/26/2015 1045   ALKPHOS 86 08/24/2016 1800   ALKPHOS 124 12/26/2015 1045   AST 33 08/24/2016 1800   AST 16 12/26/2015 1045   ALT 20 08/24/2016 1800   ALT 17 12/26/2015 1045   BILITOT 0.7 08/24/2016 1800   BILITOT 0.34 12/26/2015 1045        Impression and Plan:  65 - year old with the following issues:  1. T2 N0 muscle invasive bladder cancer diagnosed in October of 2014 after he presented with hematuria. He underwent a radical cystectomy on 10/05/2013 without any complications. His pathology did not reveal any residual disease at this time I see no indication for any further chemotherapy or adjuvant radiation  therapy.   He remains on active surveillance at this time. His most recent imaging studies in June 2018 showed no evidence of recurrent disease.  His next scan will be scheduled in December 2018 and a follow-up with Dr. Jeffie Pollock at that time.  2. Renal insufficiency: he is status post nephrectomy in January 2016 for recurrent hydronephrosis and pyelonephritis. Creatinine is close to baseline around 2.2.  3. Followup: In January 2019.    JJHERD,EYCXK 6/20/20181:56 PM

## 2016-12-25 NOTE — Telephone Encounter (Signed)
Appointments scheduled per 12/25/16 los. °Patient was given a copy of the AVS report and appointment schedule, per 12/25/16 los. °

## 2017-01-27 ENCOUNTER — Other Ambulatory Visit: Payer: Self-pay

## 2017-02-15 ENCOUNTER — Other Ambulatory Visit: Payer: Self-pay | Admitting: Family Medicine

## 2017-02-17 NOTE — Telephone Encounter (Signed)
Medication refill for one time only.  Patient needs to be seen.  Letter sent for patient to call and schedule 

## 2017-02-25 ENCOUNTER — Ambulatory Visit (INDEPENDENT_AMBULATORY_CARE_PROVIDER_SITE_OTHER): Payer: Medicare Other | Admitting: Family Medicine

## 2017-02-25 ENCOUNTER — Encounter: Payer: Self-pay | Admitting: Family Medicine

## 2017-02-25 VITALS — BP 100/60 | HR 60 | Temp 98.4°F | Resp 16 | Ht 69.0 in | Wt 198.0 lb

## 2017-02-25 DIAGNOSIS — E7800 Pure hypercholesterolemia, unspecified: Secondary | ICD-10-CM | POA: Diagnosis not present

## 2017-02-25 DIAGNOSIS — N183 Chronic kidney disease, stage 3 unspecified: Secondary | ICD-10-CM

## 2017-02-25 MED ORDER — EZETIMIBE 10 MG PO TABS: 10.0000 mg | ORAL_TABLET | Freq: Every morning | ORAL | 3 refills | Status: DC

## 2017-02-25 MED ORDER — FEBUXOSTAT 40 MG PO TABS: 40.0000 mg | ORAL_TABLET | Freq: Every day | ORAL | 3 refills | Status: DC

## 2017-02-25 MED ORDER — ROPINIROLE HCL 3 MG PO TABS: 3.0000 mg | ORAL_TABLET | Freq: Every day | ORAL | 3 refills | Status: DC

## 2017-02-25 MED ORDER — FLUTICASONE PROPIONATE 50 MCG/ACT NA SUSP: 2.0000 | Freq: Every day | NASAL | 3 refills | Status: DC

## 2017-02-25 MED ORDER — ESCITALOPRAM OXALATE 20 MG PO TABS: 40.0000 mg | ORAL_TABLET | Freq: Every day | ORAL | 3 refills | Status: AC

## 2017-02-25 MED ORDER — OMEPRAZOLE 20 MG PO CPDR: 20.0000 mg | DELAYED_RELEASE_CAPSULE | Freq: Two times a day (BID) | ORAL | 3 refills | Status: DC

## 2017-02-25 NOTE — Progress Notes (Signed)
Subjective:    Patient ID: [name], male    DOB: 09-Jun-1946, 71 y.o.   MRN: [id number]  HPI Patient is here today because he was asked to come in for refills on his medication. He is also scheduled for complete physical exam next month on the 21st. He is not fasting this afternoon. Tragically, his son recently died from a drug overdose. The remainder of our encounter today was spent in discussion. Overall he is doing as well as one would expect. He denies any insomnia. He is sleeping relatively well. Obviously he is grieving and sad but he denies any severe depression or suicidal ideation. His appetite is okay. He is due for fasting lab work including a CMP, fasting lipid panel as well as a uric acid level. He has not had any recent gout flares. Past Medical History:  Diagnosis Date  . Allergy   . Bacteremia due to Gram-negative bacteria 04/13/2015  . Bladder rupture 2010  . Bladder tumor   . Cancer ([place])    bladder  . Depression   . Gout   . 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 Hyperlipidemia   . Restless leg syndrome, controlled    Past Surgical History:  Procedure Laterality Date  . APPENDECTOMY    . BLADDER SURGERY  2010  . CYSTOSCOPY W/ RETROGRADES Bilateral 04/21/2013   Procedure: CYSTOSCOPY WITH RETROGRADE PYELOGRAM;  Surgeon: Molli Hazard, MD;  Location: [facility];  Service: Urology;  Laterality: Bilateral;  . FRACTURE SURGERY Right 01/05/2005   lower leg/ankle  . NASAL SEPTUM SURGERY    . TONSILLECTOMY AND ADENOIDECTOMY    . TRANSURETHRAL RESECTION OF BLADDER TUMOR WITH GYRUS (TURBT-GYRUS) N/A 04/21/2013   Procedure: TRANSURETHRAL RESECTION OF BLADDER TUMOR WITH GYRUS (TURBT-GYRUS), cystoscopy;  Surgeon: Molli Hazard, MD;  Location: [facility];  Service: Urology;  Laterality: N/A;   Current Outpatient Prescriptions on File Prior to Visit  Medication Sig Dispense Refill  . acetaminophen (TYLENOL) 500 MG tablet Take 1 tablet (500 mg total) by  mouth every 6 (six) hours as needed for mild pain, moderate pain or headache. 30 tablet 0  . aspirin EC 81 MG tablet Take 81 mg by mouth at bedtime.    . clonazePAM (KLONOPIN) 0.5 MG tablet Take 0.5 mg by mouth 2 (two) times daily.  0  . colchicine 0.6 MG tablet Take 0.6-1.2 mg by mouth 2 (two) times daily as needed (for gout flare).     . traZODone (DESYREL) 100 MG tablet Take 50 mg by mouth daily as needed for sleep.      No current facility-administered medications on file prior to visit.    Allergies  Allergen Reactions  . Augmentin [Amoxicillin-Pot Clavulanate] Nausea And Vomiting and Other (See Comments)    Has patient had a PCN reaction causing immediate rash, facial/tongue/throat swelling, SOB or lightheadedness with hypotension: No Has patient had a PCN reaction causing severe rash involving mucus membranes or skin necrosis: No Has patient had a PCN reaction that required hospitalization No Has patient had a PCN reaction occurring within the last 10 years: No If all of the above answers are "NO", then may proceed with Cephalosporin use.  . Statins Other (See Comments)    Reaction:  Muscle pain    Social History   Social History  . Marital status: Married    Spouse name: N/A  . Number of children: N/A  . Years of education: N/A   Occupational History  .  Not on file.   Social History Main Topics  . Smoking status: Former Smoker    Packs/day: 0.50    Years: 30.00    Types: Cigarettes    Quit date: 07/24/1989  . Smokeless tobacco: Never Used  . Alcohol use Yes     Comment: social  . Drug use: No  . Sexual activity: Not on file   Other Topics Concern  . 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      Objective:   Physical Exam  Constitutional: He appears well-developed and well-nourished. No distress.  Neck: Neck supple.  Cardiovascular:  Normal rate, regular rhythm and normal heart sounds.   No murmur heard. Pulmonary/Chest: Effort normal and breath sounds normal. No respiratory distress. He has no wheezes. He has no rales.  Abdominal: Soft. Bowel sounds are normal. He exhibits no distension. There is no tenderness. There is no rebound.  Musculoskeletal: He exhibits no edema.  Lymphadenopathy:    He has no cervical adenopathy.  Skin: He is not diaphoretic.  Psychiatric: He has a normal mood and affect. His behavior is normal. Judgment and thought content normal.  Vitals reviewed.         Assessment & Plan:  CKD (chronic kidney disease) stage 3, GFR 30-59 ml/min  Pure hypercholesterolemia  I would like to check a fasting lipid panel. Goal LDL cholesterol is less than 130. I would also like to check a uric acid level. Goal uric acid is less than 6. I would also like to recheck his renal function. His baseline creatinine is 2.1. Patient is also due for hepatitis C screening. However the patient is scheduled to come in for his physical exam in less than a month. I will obtain the fasting lab work at that time. I'm doing this to avoid having the patient come back for lab work prior to his physical exam. I offered my condolences regarding the death of his son. If there is anything I can do to help promote be glad to try. Fortunately he seems to be doing surprisingly well given the unfortunate circumstances

## 2017-03-07 ENCOUNTER — Ambulatory Visit (INDEPENDENT_AMBULATORY_CARE_PROVIDER_SITE_OTHER): Payer: Medicare Other | Admitting: Family Medicine

## 2017-03-07 ENCOUNTER — Encounter: Payer: Self-pay | Admitting: Family Medicine

## 2017-03-07 VITALS — BP 100/60 | HR 62 | Temp 97.4°F | Resp 12 | Ht 69.0 in | Wt 198.0 lb

## 2017-03-07 DIAGNOSIS — N184 Chronic kidney disease, stage 4 (severe): Secondary | ICD-10-CM | POA: Diagnosis not present

## 2017-03-07 DIAGNOSIS — Z0000 Encounter for general adult medical examination without abnormal findings: Secondary | ICD-10-CM

## 2017-03-07 DIAGNOSIS — Z125 Encounter for screening for malignant neoplasm of prostate: Secondary | ICD-10-CM

## 2017-03-07 DIAGNOSIS — Z23 Encounter for immunization: Secondary | ICD-10-CM | POA: Diagnosis not present

## 2017-03-07 DIAGNOSIS — R739 Hyperglycemia, unspecified: Secondary | ICD-10-CM

## 2017-03-07 DIAGNOSIS — Z1159 Encounter for screening for other viral diseases: Secondary | ICD-10-CM

## 2017-03-07 LAB — CBC WITH DIFFERENTIAL/PLATELET
BASOS ABS: 51 {cells}/uL (ref 0–200)
Basophils Relative: 1 %
EOS ABS: 51 {cells}/uL (ref 15–500)
Eosinophils Relative: 1 %
HEMATOCRIT: 42.5 % (ref 38.5–50.0)
HEMOGLOBIN: 14 g/dL (ref 13.0–17.0)
LYMPHS ABS: 918 {cells}/uL (ref 850–3900)
LYMPHS PCT: 18 %
MCH: 29.8 pg (ref 27.0–33.0)
MCHC: 32.9 g/dL (ref 32.0–36.0)
MCV: 90.4 fL (ref 80.0–100.0)
MPV: 9 fL (ref 7.5–12.5)
Monocytes Absolute: 357 cells/uL (ref 200–950)
Monocytes Relative: 7 %
NEUTROS PCT: 73 %
Neutro Abs: 3723 cells/uL (ref 1500–7800)
Platelets: 218 10*3/uL (ref 140–400)
RBC: 4.7 MIL/uL (ref 4.20–5.80)
RDW: 14.1 % (ref 11.0–15.0)
WBC: 5.1 10*3/uL (ref 3.8–10.8)

## 2017-03-07 NOTE — Progress Notes (Signed)
Subjective:    Patient ID: [name], male    DOB: [birth year], 71 y.o.   MRN: [id number]  HPI Patient is a pleasant 71 y/o WM here today for complete physical exam. He denies any concerns. Unfortunately, as mentioned at his last OV, his son died from drug overdose recently. Colonoscopy was performed in 2010 and is not due again until 2020. 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 His bladder has been completely removed. They also removed his prostate at the same time. They did find a small focus of prostate cancer when he did this and therefore he is due for PSA. He is due for seasonal flu shot which she will receive today. He is also due for Shingrix and Tdap.  He requests Hep C screening today.   Immunization History  Administered Date(s) Administered  . Influenza Split 05/25/2013  . Influenza,inj,Quad PF,6+ Mos 04/22/2014, 04/28/2015, 05/09/2016, 03/07/2017  . Pneumococcal Conjugate-13 04/22/2014  . Pneumococcal Polysaccharide-23 04/28/2015  . Zoster 01/27/2009   Past Medical History:  Diagnosis Date  . Allergy   . Bacteremia due to Gram-negative bacteria 04/13/2015  . Bladder rupture 2010  . Bladder tumor   . Cancer ([place])    bladder  . Depression   . Gout   . 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 Hyperlipidemia   . Restless leg syndrome, controlled    Past Surgical History:  Procedure Laterality Date  . APPENDECTOMY    . BLADDER SURGERY  2010  . CYSTOSCOPY W/ RETROGRADES Bilateral 04/21/2013   Procedure: CYSTOSCOPY WITH RETROGRADE PYELOGRAM;  Surgeon: Molli Hazard, MD;  Location: [facility];  Service: Urology;  Laterality: Bilateral;  . FRACTURE SURGERY Right 01/05/2005   lower leg/ankle  . NASAL SEPTUM SURGERY    . TONSILLECTOMY AND ADENOIDECTOMY    . TRANSURETHRAL RESECTION OF BLADDER TUMOR WITH GYRUS (TURBT-GYRUS) N/A 04/21/2013   Procedure: TRANSURETHRAL RESECTION OF BLADDER TUMOR WITH GYRUS (TURBT-GYRUS), cystoscopy;  Surgeon: Molli Hazard, MD;  Location: [facility];  Service: Urology;  Laterality: N/A;   Current Outpatient Prescriptions on File Prior to Visit  Medication Sig Dispense Refill  . acetaminophen (TYLENOL) 500 MG tablet Take 1 tablet (500 mg total) by mouth every 6 (six) hours as needed for mild pain, moderate pain or headache. 30 tablet 0  . aspirin EC 81 MG tablet Take 81 mg by mouth at bedtime.    . clonazePAM (KLONOPIN) 0.5 MG tablet Take 0.5 mg by mouth 2 (two) times daily.  0  . colchicine 0.6 MG tablet Take 0.6-1.2 mg by mouth 2 (two) times daily as needed (for gout flare).     [name] escitalopram (LEXAPRO) 20 MG tablet Take 2 tablets (40 mg total) by mouth at bedtime. 180 tablet 3  . ezetimibe (ZETIA) 10 MG tablet Take 1 tablet (10 mg total) by mouth every morning. 90 tablet 3  . febuxostat (ULORIC) 40 MG tablet Take 1 tablet (40 mg total) by mouth daily. 90 tablet 3  . fluticasone (FLONASE) 50 MCG/ACT nasal spray Place 2 sprays into both nostrils daily. 48 g 3  . omeprazole (PRILOSEC) 20 MG capsule Take 1 capsule (20 mg total) by mouth 2 (two) times daily before a meal. 180 capsule 3  . rOPINIRole (REQUIP) 3 MG tablet Take 1 tablet (3 mg total) by mouth at bedtime. 90 tablet 3  . traZODone (DESYREL) 100 MG tablet Take 50 mg by mouth daily as needed for sleep.  No current facility-administered medications on file prior to visit.    Allergies  Allergen Reactions  . Augmentin [Amoxicillin-Pot Clavulanate] Nausea And Vomiting and Other (See Comments)    Has patient had a PCN reaction causing immediate rash, facial/tongue/throat swelling, SOB or lightheadedness with hypotension: No Has patient had a PCN reaction causing severe rash involving mucus membranes or skin necrosis: No Has patient had a PCN reaction that required hospitalization No Has patient had a PCN reaction occurring within the last 10 years: No If all of the above answers are "NO", then may proceed with Cephalosporin use.  . Statins Other  (See Comments)    Reaction:  Muscle pain    Social History   Social History  . Marital status: Married    Spouse name: N/A  . Number of children: N/A  . Years of education: N/A   Occupational History  . Not on file.   Social History Main Topics  . Smoking status: Former Smoker    Packs/day: 0.50    Years: 30.00    Types: Cigarettes    Quit date: 07/24/1989  . Smokeless tobacco: Never Used  . Alcohol use Yes     Comment: social  . Drug use: No  . Sexual activity: Not on file   Other Topics Concern  . 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 Cancer Mother        ovarian  . Cirrhosis Father   . Alcohol abuse Father      Review of Systems  All other systems reviewed and are negative.      Objective:   Physical Exam  Constitutional: He is oriented to person, place, and time. He appears well-developed and well-nourished. No distress.  HENT:  Head: Normocephalic and atraumatic.  Right Ear: External ear normal.  Left Ear: External ear normal.  Nose: Nose normal.  Mouth/Throat: Oropharynx is clear and moist. No oropharyngeal exudate.  Eyes: Pupils are equal, round, and reactive to light. Conjunctivae and EOM are normal. Right eye exhibits no discharge. Left eye exhibits no discharge. No scleral icterus.  Neck: Normal range of motion. Neck supple. No JVD present. No tracheal deviation present. No thyromegaly present.  Cardiovascular: Normal rate, regular rhythm, normal heart sounds and intact distal pulses.  Exam reveals no gallop and no friction rub.   No murmur heard. Pulmonary/Chest: Effort normal and breath sounds normal. No stridor. No respiratory distress. He has no wheezes. He has no rales. He exhibits no tenderness.  Abdominal: Soft. Bowel sounds are normal. He exhibits no distension and no mass. There is no tenderness. There is no rebound and no guarding.    Musculoskeletal: Normal range of motion. He exhibits no edema, tenderness or deformity.  Lymphadenopathy:    He has no cervical adenopathy.  Neurological: He is alert and oriented to person, place, and time. He has normal reflexes. No cranial nerve deficit. He exhibits normal muscle tone. Coordination normal.  Skin: Skin is warm. No rash noted. He is not diaphoretic. No erythema. No pallor.  Psychiatric: He has a normal mood and affect. His behavior is normal. Judgment and thought content normal.  Vitals reviewed.         Assessment & Plan:  Encounter for hepatitis C screening test for low risk patient - Plan: Hepatitis C Ab Reflex HCV RNA, QUANT, Flu Vaccine QUAD 36+  mos IM  Elevated blood sugar level - Plan: COMPLETE METABOLIC PANEL WITH GFR, Lipid panel, Hemoglobin A1c  CKD (chronic kidney disease) stage 4, GFR 15-29 ml/min ([facility]) - Plan: CBC with Differential/Platelet, COMPLETE METABOLIC PANEL WITH GFR, Flu Vaccine QUAD 36+ mos IM  Prostate cancer screening - Plan: PSA  Needs flu shot - Plan: Flu Vaccine QUAD 36+ mos IM  Routine general medical examination at a health care facility  Physical exam today is completely normal. Colonoscopy is up-to-date. I will screen for prostate cancer with a PSA given the fact he has a history of prostate cancer as mentioned in the history of present illness. Patient received his flu shot today. I recommended Shingrix. I will screen the patient for hepatitis C. I will also monitor his chronic kidney disease with a CMP as well as a CBC. I will check a fasting lipid panel. He has a history of prediabetes and I will check a hemoglobin A1c as well

## 2017-03-08 LAB — COMPLETE METABOLIC PANEL WITH GFR
ALBUMIN: 4.2 g/dL (ref 3.6–5.1)
ALT: 14 U/L (ref 9–46)
AST: 19 U/L (ref 10–35)
Alkaline Phosphatase: 105 U/L (ref 40–115)
BUN: 25 mg/dL (ref 7–25)
CALCIUM: 8.9 mg/dL (ref 8.6–10.3)
CO2: 20 mmol/L (ref 20–32)
CREATININE: 2.06 mg/dL — AB (ref 0.70–1.18)
Chloride: 105 mmol/L (ref 98–110)
GFR, EST AFRICAN AMERICAN: 37 mL/min — AB (ref >=60)
GFR, Est Non African American: 32 mL/min — ABNORMAL LOW (ref >=60)
Glucose, Bld: 108 mg/dL — ABNORMAL HIGH (ref 70–99)
POTASSIUM: 5 mmol/L (ref 3.5–5.3)
Sodium: 138 mmol/L (ref 135–146)
Total Bilirubin: 0.5 mg/dL (ref 0.2–1.2)
Total Protein: 6.9 g/dL (ref 6.1–8.1)

## 2017-03-08 LAB — HEMOGLOBIN A1C
HEMOGLOBIN A1C: 5.7 % — AB (ref <5.7)
MEAN PLASMA GLUCOSE: 117 mg/dL

## 2017-03-08 LAB — LIPID PANEL
CHOLESTEROL: 184 mg/dL (ref <200)
HDL: 40 mg/dL — ABNORMAL LOW (ref >40)
LDL Cholesterol: 105 mg/dL — ABNORMAL HIGH (ref <100)
Total CHOL/HDL Ratio: 4.6 Ratio (ref <5.0)
Triglycerides: 197 mg/dL — ABNORMAL HIGH (ref <150)
VLDL: 39 mg/dL — ABNORMAL HIGH (ref <30)

## 2017-03-08 LAB — HEPATITIS C ANTIBODY: HCV Ab: NONREACTIVE

## 2017-03-08 LAB — PSA: PSA: <0.1 ng/mL (ref <=4.0)

## 2017-03-11 ENCOUNTER — Encounter: Payer: Self-pay | Admitting: Family Medicine

## 2017-03-20 DIAGNOSIS — F411 Generalized anxiety disorder: Secondary | ICD-10-CM | POA: Diagnosis not present

## 2017-03-20 DIAGNOSIS — F332 Major depressive disorder, recurrent severe without psychotic features: Secondary | ICD-10-CM | POA: Diagnosis not present

## 2017-03-20 DIAGNOSIS — Z634 Disappearance and death of family member: Secondary | ICD-10-CM | POA: Diagnosis not present

## 2017-05-16 ENCOUNTER — Other Ambulatory Visit: Payer: Self-pay | Admitting: Family Medicine

## 2017-06-10 DIAGNOSIS — F411 Generalized anxiety disorder: Secondary | ICD-10-CM | POA: Diagnosis not present

## 2017-06-10 DIAGNOSIS — Z634 Disappearance and death of family member: Secondary | ICD-10-CM | POA: Diagnosis not present

## 2017-06-10 DIAGNOSIS — F332 Major depressive disorder, recurrent severe without psychotic features: Secondary | ICD-10-CM | POA: Diagnosis not present

## 2017-06-11 DIAGNOSIS — N189 Chronic kidney disease, unspecified: Secondary | ICD-10-CM | POA: Diagnosis not present

## 2017-06-11 DIAGNOSIS — Z8546 Personal history of malignant neoplasm of prostate: Secondary | ICD-10-CM | POA: Diagnosis not present

## 2017-06-18 ENCOUNTER — Encounter: Payer: Self-pay | Admitting: Family Medicine

## 2017-06-18 ENCOUNTER — Ambulatory Visit (INDEPENDENT_AMBULATORY_CARE_PROVIDER_SITE_OTHER): Payer: Medicare Other | Admitting: Family Medicine

## 2017-06-18 VITALS — BP 100/60 | HR 68 | Temp 98.7°F | Resp 16 | Ht 69.0 in | Wt 202.0 lb

## 2017-06-18 DIAGNOSIS — N189 Chronic kidney disease, unspecified: Secondary | ICD-10-CM | POA: Diagnosis not present

## 2017-06-18 DIAGNOSIS — Z8546 Personal history of malignant neoplasm of prostate: Secondary | ICD-10-CM | POA: Diagnosis not present

## 2017-06-18 DIAGNOSIS — J069 Acute upper respiratory infection, unspecified: Secondary | ICD-10-CM | POA: Diagnosis not present

## 2017-06-18 DIAGNOSIS — Z8551 Personal history of malignant neoplasm of bladder: Secondary | ICD-10-CM | POA: Diagnosis not present

## 2017-06-18 MED ORDER — HYDROCODONE-HOMATROPINE 5-1.5 MG/5ML PO SYRP: 5.0000 mL | ORAL_SOLUTION | Freq: Three times a day (TID) | ORAL | 0 refills | Status: DC | PRN

## 2017-06-18 MED ORDER — AZITHROMYCIN 250 MG PO TABS: ORAL_TABLET | ORAL | 0 refills | Status: DC

## 2017-06-18 NOTE — Progress Notes (Signed)
Subjective:    Patient ID: [name], male    DOB: 03/17/1946, 71 y.o.   MRN: [id number]  HPI  Symptoms began over the weekend starting with a sore throat.  He subsequently developed postnasal drip and drainage, intermittent sinus pressure, and a nagging persistent cough productive of yellow sputum.  He denies any chest pain.  He denies any fevers.  He denies any shortness of breath.  He denies any sinus pain.  He denies any hemoptysis or purulent sputum.  Exam today is unremarkable Past Medical History:  Diagnosis Date  . Allergy   . Bacteremia due to Gram-negative bacteria 04/13/2015  . Bladder rupture 2010  . Bladder tumor   . Cancer ([place])    bladder  . Depression   . Gout   . 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 Hyperlipidemia   . Restless leg syndrome, controlled    Past Surgical History:  Procedure Laterality Date  . APPENDECTOMY    . BLADDER SURGERY  2010  . CYSTOSCOPY W/ RETROGRADES Bilateral 04/21/2013   Procedure: CYSTOSCOPY WITH RETROGRADE PYELOGRAM;  Surgeon: Molli Hazard, MD;  Location: [facility];  Service: Urology;  Laterality: Bilateral;  . FRACTURE SURGERY Right 01/05/2005   lower leg/ankle  . NASAL SEPTUM SURGERY    . TONSILLECTOMY AND ADENOIDECTOMY    . TRANSURETHRAL RESECTION OF BLADDER TUMOR WITH GYRUS (TURBT-GYRUS) N/A 04/21/2013   Procedure: TRANSURETHRAL RESECTION OF BLADDER TUMOR WITH GYRUS (TURBT-GYRUS), cystoscopy;  Surgeon: Molli Hazard, MD;  Location: [facility];  Service: Urology;  Laterality: N/A;   Current Outpatient Medications on File Prior to Visit  Medication Sig Dispense Refill  . acetaminophen (TYLENOL) 500 MG tablet Take 1 tablet (500 mg total) by mouth every 6 (six) hours as needed for mild pain, moderate pain or headache. 30 tablet 0  . aspirin EC 81 MG tablet Take 81 mg by mouth at bedtime.    . clonazePAM (KLONOPIN) 0.5 MG tablet Take 0.5 mg by mouth 2 (two) times daily.  0  . colchicine 0.6 MG tablet Take  0.6-1.2 mg by mouth 2 (two) times daily as needed (for gout flare).     [name] escitalopram (LEXAPRO) 20 MG tablet Take 2 tablets (40 mg total) by mouth at bedtime. 180 tablet 3  . ezetimibe (ZETIA) 10 MG tablet TAKE 1 TABLET BY MOUTH  EVERY MORNING 90 tablet 1  . febuxostat (ULORIC) 40 MG tablet Take 1 tablet (40 mg total) by mouth daily. 90 tablet 3  . fluticasone (FLONASE) 50 MCG/ACT nasal spray Place 2 sprays into both nostrils daily. 48 g 3  . omeprazole (PRILOSEC) 20 MG capsule Take 1 capsule (20 mg total) by mouth 2 (two) times daily before a meal. 180 capsule 3  . rOPINIRole (REQUIP) 3 MG tablet Take 1 tablet (3 mg total) by mouth at bedtime. 90 tablet 3  . traZODone (DESYREL) 100 MG tablet Take 50 mg by mouth daily as needed for sleep.      No current facility-administered medications on file prior to visit.    Allergies  Allergen Reactions  . Augmentin [Amoxicillin-Pot Clavulanate] Nausea And Vomiting and Other (See Comments)    Has patient had a PCN reaction causing immediate rash, facial/tongue/throat swelling, SOB or lightheadedness with hypotension: No Has patient had a PCN reaction causing severe rash involving mucus membranes or skin necrosis: No Has patient had a PCN reaction that required hospitalization No Has patient had a PCN reaction occurring within the  last 10 years: No If all of the above answers are "NO", then may proceed with Cephalosporin use.  . Statins Other (See Comments)    Reaction:  Muscle pain    Social History   Socioeconomic History  . Marital status: Married    Spouse name: Not on file  . Number of children: Not on file  . Years of education: Not on file  . Highest education level: Not on file  Social Needs  . Financial resource strain: Not on file  . Food insecurity - worry: Not on file  . Food insecurity - inability: Not on file  . Transportation needs - medical: Not on file  . Transportation needs - non-medical: Not on file  Occupational History   . Not on file  Tobacco Use  . Smoking status: Former Smoker    Packs/day: 0.50    Years: 30.00    Pack years: 15.00    Types: Cigarettes    Last attempt to quit: 07/24/1989    Years since quitting: 27.9  . Smokeless tobacco: Never Used  Substance and Sexual Activity  . Alcohol use: Yes    Comment: social  . Drug use: No  . Sexual activity: Not on file  Other Topics Concern  . 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      Objective:   Physical Exam  Constitutional: He appears well-developed and well-nourished.  HENT:  Right Ear: External ear normal.  Left Ear: External ear normal.  Nose: Nose normal.  Mouth/Throat: Oropharynx is clear and moist. No oropharyngeal exudate.  Eyes: Conjunctivae are normal.  Neck: Neck supple.  Cardiovascular: Normal rate, regular rhythm and normal heart sounds.  No murmur heard. Pulmonary/Chest: Effort normal and breath sounds normal. No respiratory distress. He has no wheezes. He has no rales.  Lymphadenopathy:    He has no cervical adenopathy.  Vitals reviewed.         Assessment & Plan:  Viral upper respiratory tract infection  Patient appears to have a viral upper respiratory infection.  I recommended tincture of time.  I anticipate his symptoms will gradually improve over the next 4-5 days.  There are no worrisome signs for a serious bacterial illness at the present time.  I did give the patient prescription for Hycodan 1 teaspoon every 6 hours as needed for cough.  Patient can use over-the-counter cold medication as needed for symptom relief.  I did give the patient a Z-Pak going into the weekend in case he develops fever, purulent sputum, shortness of breath or signs of a secondary bacterial illness.  However he is instructed not to get the Z-Pak unless symptoms worsen over the next week.

## 2017-07-13 ENCOUNTER — Telehealth: Payer: Self-pay | Admitting: Oncology

## 2017-07-13 NOTE — Telephone Encounter (Signed)
LVM ADVISING APPT 1/16 MOVED TO 1/29 @ 1.15 DUE TO MD CALL.

## 2017-07-23 ENCOUNTER — Ambulatory Visit: Payer: Medicare Other | Admitting: Oncology

## 2017-07-29 DIAGNOSIS — L820 Inflamed seborrheic keratosis: Secondary | ICD-10-CM | POA: Diagnosis not present

## 2017-08-05 ENCOUNTER — Telehealth: Payer: Self-pay | Admitting: Oncology

## 2017-08-05 ENCOUNTER — Inpatient Hospital Stay: Payer: Medicare Other | Attending: Oncology | Admitting: Oncology

## 2017-08-05 VITALS — BP 117/69 | HR 68 | Temp 98.1°F | Resp 18 | Ht 69.0 in | Wt 202.6 lb

## 2017-08-05 DIAGNOSIS — Z9221 Personal history of antineoplastic chemotherapy: Secondary | ICD-10-CM

## 2017-08-05 DIAGNOSIS — Z7982 Long term (current) use of aspirin: Secondary | ICD-10-CM

## 2017-08-05 DIAGNOSIS — C679 Malignant neoplasm of bladder, unspecified: Secondary | ICD-10-CM | POA: Diagnosis not present

## 2017-08-05 DIAGNOSIS — Z79899 Other long term (current) drug therapy: Secondary | ICD-10-CM | POA: Diagnosis not present

## 2017-08-05 DIAGNOSIS — Z905 Acquired absence of kidney: Secondary | ICD-10-CM | POA: Insufficient documentation

## 2017-08-05 DIAGNOSIS — N2889 Other specified disorders of kidney and ureter: Secondary | ICD-10-CM | POA: Diagnosis not present

## 2017-08-05 NOTE — Telephone Encounter (Signed)
Scheduled appt per 1/29 los- Gave patient AVS and calender per los.  

## 2017-08-05 NOTE — Progress Notes (Signed)
Hematology and Oncology Follow Up Visit  [name] [id number] [birth year] 72 y.o. 08/05/2017 1:51 PM [name] [name], MDPickard, Austin Mcgee, MD   Principle Diagnosis: 72 year old man with bladder cancer diagnosed in October of 2014 presented with a T2 N0 disease.  He had a complete response after chemotherapy and received curative surgical resection in March 2015.  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 He is S/P neoadjuvant chemotherapy with cisplatinum and Gemzar started on 06/11/2013. He is status post 3 cycle of chemotherapy completed in January of 2015. He underwent a cystectomy on 10/05/2013 at [facility]. The pathology revealed no residual tumor with 12 lymph nodes sampled noninvolved with malignancy. 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 .  Current therapy:  Observation and surveillance.  Interim History: [name] is here for a follow-up by himself.  He reports no major changes in his health since the last visit.  He remains active and continues to attend to activities of daily living.  He denies any hematuria, dysuria or flank pain.  His appetite remain excellent.  He denies any recent hospitalizations or illnesses.  His urine output remained excellent although he does report frequency.  He denies any flank pain or hospitalizations.   He does not report any headaches, blurry vision, syncope or seizures.  He does not report any fevers, chills or sweats.  He has not reported any nausea or vomiting or abdominal pain. Has not reported any genitourinary complaints. He denies any chest pain shortness of breath cough or hemoptysis. He denies any any frequency urgency or hesitancy. Does not report any skeletal complaints.Rest of his review of systems is negative.   Medications: I have reviewed the patient's current medications.  Current Outpatient  Medications  Medication Sig Dispense Refill  . acetaminophen (TYLENOL) 500 MG tablet Take 1 tablet (500 mg total) by mouth every 6 (six) hours as needed for mild pain, moderate pain or headache. 30 tablet 0  . aspirin EC 81 MG tablet Take 81 mg by mouth at bedtime.    [name] azithromycin (ZITHROMAX) 250 MG tablet 2 tabs poqday1, 1 tab poqday 2-5 6 tablet 0  . clonazePAM (KLONOPIN) 0.5 MG tablet Take 0.5 mg by mouth 2 (two) times daily.  0  . colchicine 0.6 MG tablet Take 0.6-1.2 mg by mouth 2 (two) times daily as needed (for gout flare).     [name] escitalopram (LEXAPRO) 20 MG tablet Take 2 tablets (40 mg total) by mouth at bedtime. 180 tablet 3  . ezetimibe (ZETIA) 10 MG tablet TAKE 1 TABLET BY MOUTH  EVERY MORNING 90 tablet 1  . febuxostat (ULORIC) 40 MG tablet Take 1 tablet (40 mg total) by mouth daily. 90 tablet 3  . fluticasone (FLONASE) 50 MCG/ACT nasal spray Place 2 sprays into both nostrils daily. 48 g 3  . HYDROcodone-homatropine (HYCODAN) 5-1.5 MG/5ML syrup Take 5 mLs by mouth every 8 (eight) hours as needed for cough. 120 mL 0  . omeprazole (PRILOSEC) 20 MG capsule Take 1 capsule (20 mg total) by mouth 2 (two) times daily before a meal. 180 capsule 3  . rOPINIRole (REQUIP) 3 MG tablet Take 1 tablet (3 mg total) by mouth at bedtime. 90 tablet 3  . traZODone (DESYREL) 100 MG tablet Take 50 mg by mouth daily as needed for sleep.      No current facility-administered medications for this visit.  Allergies:  Allergies  Allergen Reactions  . Augmentin [Amoxicillin-Pot Clavulanate] Nausea And Vomiting and Other (See Comments)    Has patient had a PCN reaction causing immediate rash, facial/tongue/throat swelling, SOB or lightheadedness with hypotension: No Has patient had a PCN reaction causing severe rash involving mucus membranes or skin necrosis: No Has patient had a PCN reaction that required hospitalization No Has patient had a PCN reaction occurring within the last 10 years: No If all  of the above answers are "NO", then may proceed with Cephalosporin use.  . Statins Other (See Comments)    Reaction:  Muscle pain     Past Medical History, Surgical history, Social history, and Family updated and remain unchanged.    Physical Exam: Blood pressure 117/69, pulse 68, temperature 98.1 F (36.7 C), temperature source Oral, resp. rate 18, height 5\' 9"  (1.753 m), weight 202 lb 9.6 oz (91.9 kg), SpO2 100 %.   ECOG: 0 General appearance: Alert, awake gentleman without distress Head: No trauma noted. Eyes: Sclera are anicteric.  Pupils are equal and round and reactive to light Oropharynx: No oral ulcers or lesions. Lymph nodes: Cervical, supraclavicular, and axillary nodes normal. Heart:regular rate and rhythm, S1, S2 normal, no murmur, click, rub or gallop Lung:chest clear to auscultation in all lung fields, no wheezing, rales, or dullness to percussion. Abdomen: soft, non-tender, without masses or organomegaly no rebound or guarding. Musculoskeletal: No joint effusion or tenderness. Skin: No rashes or lesions.   Lab Results: Lab Results  Component Value Date   WBC 5.1 03/07/2017   HGB 14.0 03/07/2017   HCT 42.5 03/07/2017   MCV 90.4 03/07/2017   PLT 218 03/07/2017     Chemistry      Component Value Date/Time   NA 138 03/07/2017 1100   NA 138 12/26/2015 1045   K 5.0 03/07/2017 1100   K 4.8 12/26/2015 1045   CL 105 03/07/2017 1100   CO2 20 03/07/2017 1100   CO2 23 12/26/2015 1045   BUN 25 03/07/2017 1100   BUN 22.4 12/26/2015 1045   CREATININE 2.06 (H) 03/07/2017 1100   CREATININE 2.3 (H) 12/26/2015 1045      Component Value Date/Time   CALCIUM 8.9 03/07/2017 1100   CALCIUM 9.1 12/26/2015 1045   ALKPHOS 105 03/07/2017 1100   ALKPHOS 124 12/26/2015 1045   AST 19 03/07/2017 1100   AST 16 12/26/2015 1045   ALT 14 03/07/2017 1100   ALT 17 12/26/2015 1045   BILITOT 0.5 03/07/2017 1100   BILITOT 0.34 12/26/2015 1045      CT scan obtained in June 2018  showed no evidence of recurrent disease.  Impression and Plan:  89 -year old with the following issues:    1. Bladder cancer diagnosed in October of 2014. He underwent a radical cystectomy on 10/05/2013 after neoadjuvant chemotherapy without any complications. His pathology did not reveal any residual disease.   He continues to be on active surveillance without any evidence of recurrent disease.  Last imaging studies in June 2018 showed no evidence of recurrence.  Plan is to continue with active surveillance at this time and he will have imaging studies done in June 2019 under the care of Dr. Jeffie Pollock.  The natural course of this disease was reviewed today including his risk of relapse and review of his pathology.  His risk of relapse remains low at this time and currently declining further out from his surgery.  He will have repeat imaging studies in June 2019 and  I recommended no further oncology follow-up after that.   2. Renal insufficiency: His kidney function remained stable with excellent urine output.  He is status post nephrectomy in January 2016 for recurrent hydronephrosis and pyelonephritis.   3. Followup: In August 2019.  15  minutes was spent with the patient face-to-face today.  More than 50% of time was dedicated to patient counseling, education and answering questioning regarding his diagnosis, prognosis and coordination of care.   Zola Button 1/29/20191:51 PM

## 2017-10-29 ENCOUNTER — Encounter: Payer: Self-pay | Admitting: Family Medicine

## 2017-10-29 ENCOUNTER — Ambulatory Visit (INDEPENDENT_AMBULATORY_CARE_PROVIDER_SITE_OTHER): Payer: Medicare Other | Admitting: Family Medicine

## 2017-10-29 VITALS — BP 118/62 | HR 76 | Temp 100.8°F | Resp 16 | Ht 69.0 in | Wt 200.0 lb

## 2017-10-29 DIAGNOSIS — J181 Lobar pneumonia, unspecified organism: Secondary | ICD-10-CM | POA: Diagnosis not present

## 2017-10-29 DIAGNOSIS — J189 Pneumonia, unspecified organism: Secondary | ICD-10-CM

## 2017-10-29 DIAGNOSIS — R059 Cough, unspecified: Secondary | ICD-10-CM

## 2017-10-29 DIAGNOSIS — R05 Cough: Secondary | ICD-10-CM

## 2017-10-29 MED ORDER — ALBUTEROL SULFATE HFA 108 (90 BASE) MCG/ACT IN AERS: 2.0000 | INHALATION_SPRAY | Freq: Four times a day (QID) | RESPIRATORY_TRACT | 0 refills | Status: DC | PRN

## 2017-10-29 MED ORDER — BENZONATATE 100 MG PO CAPS: 100.0000 mg | ORAL_CAPSULE | Freq: Three times a day (TID) | ORAL | 0 refills | Status: DC | PRN

## 2017-10-29 MED ORDER — HYDROCODONE-HOMATROPINE 5-1.5 MG/5ML PO SYRP
5.0000 mL | ORAL_SOLUTION | Freq: Three times a day (TID) | ORAL | 0 refills | Status: DC | PRN
Start: 2017-10-29 — End: 2018-03-03

## 2017-10-29 MED ORDER — GUAIFENESIN ER 600 MG PO TB12: 600.0000 mg | ORAL_TABLET | Freq: Two times a day (BID) | ORAL | 0 refills | Status: DC | PRN

## 2017-10-29 MED ORDER — AZITHROMYCIN 250 MG PO TABS
ORAL_TABLET | ORAL | 0 refills | Status: DC
Start: 2017-10-29 — End: 2018-03-03

## 2017-10-29 MED ORDER — IPRATROPIUM-ALBUTEROL 0.5-2.5 (3) MG/3ML IN SOLN
3.0000 mL | Freq: Once | RESPIRATORY_TRACT | Status: AC
  Administered 2017-10-29: 3 mL via RESPIRATORY_TRACT

## 2017-10-29 MED ORDER — PREDNISONE 20 MG PO TABS: ORAL_TABLET | ORAL | 0 refills | Status: DC

## 2017-10-29 NOTE — Patient Instructions (Signed)
Community-Acquired Pneumonia, Adult Pneumonia is an infection of the lungs. One type of pneumonia can happen while a person is in a hospital. A different type can happen when a person is not in a hospital (community-acquired pneumonia). It is easy for this kind to spread from person to person. It can spread to you if you breathe near an infected person who coughs or sneezes. Some symptoms include:  A dry cough.  A wet (productive) cough.  Fever.  Sweating.  Chest pain.  Follow these instructions at home:  Take over-the-counter and prescription medicines only as told by your doctor. ? Only take cough medicine if you are losing sleep. ? If you were prescribed an antibiotic medicine, take it as told by your doctor. Do not stop taking the antibiotic even if you start to feel better.  Sleep with your head and neck raised (elevated). You can do this by putting a few pillows under your head, or you can sleep in a recliner.  Do not use tobacco products. These include cigarettes, chewing tobacco, and e-cigarettes. If you need help quitting, ask your doctor.  Drink enough water to keep your pee (urine) clear or pale yellow. A shot (vaccine) can help prevent pneumonia. Shots are often suggested for:  People older than 71 years of age.  People older than 71 years of age: ? Who are having cancer treatment. ? Who have long-term (chronic) lung disease. ? Who have problems with their body's defense system (immune system).  You may also prevent pneumonia if you take these actions:  Get the flu (influenza) shot every year.  Go to the dentist as often as told.  Wash your hands often. If soap and water are not available, use hand sanitizer.  Contact a doctor if:  You have a fever.  You lose sleep because your cough medicine does not help. Get help right away if:  You are short of breath and it gets worse.  You have more chest pain.  Your sickness gets worse. This is very serious  if: ? You are an older adult. ? Your body's defense system is weak.  You cough up blood. This information is not intended to replace advice given to you by your health care provider. Make sure you discuss any questions you have with your health care provider. Document Released: 12/11/2007 Document Revised: 11/30/2015 Document Reviewed: 10/19/2014 Elsevier Interactive Patient Education  2018 [employer].    Acute Bronchitis, Adult Acute bronchitis is sudden (acute) swelling of the air tubes (bronchi) in the lungs. Acute bronchitis causes these tubes to fill with mucus, which can make it hard to breathe. It can also cause coughing or wheezing. In adults, acute bronchitis usually goes away within 2 weeks. A cough caused by bronchitis may last up to 3 weeks. Smoking, allergies, and asthma can make the condition worse. Repeated episodes of bronchitis may cause further lung problems, such as chronic obstructive pulmonary disease (COPD). What are the causes? This condition can be caused by germs and by substances that irritate the lungs, including:  Cold and flu viruses. This condition is most often caused by the same virus that causes a cold.  Bacteria.  Exposure to tobacco smoke, dust, fumes, and air pollution.  What increases the risk? This condition is more likely to develop in people who:  Have close contact with someone with acute bronchitis.  Are exposed to lung irritants, such as tobacco smoke, dust, fumes, and vapors.  Have a weak immune system.  Have  a respiratory condition such as asthma.  What are the signs or symptoms? Symptoms of this condition include:  A cough.  Coughing up clear, yellow, or green mucus.  Wheezing.  Chest congestion.  Shortness of breath.  A fever.  Body aches.  Chills.  A sore throat.  How is this diagnosed? This condition is usually diagnosed with a physical exam. During the exam, your health care provider may order tests, such as  chest X-rays, to rule out other conditions. He or she may also:  Test a sample of your mucus for bacterial infection.  Check the level of oxygen in your blood. This is done to check for pneumonia.  Do a chest X-ray or lung function testing to rule out pneumonia and other conditions.  Perform blood tests.  Your health care provider will also ask about your symptoms and medical history. How is this treated? Most cases of acute bronchitis clear up over time without treatment. Your health care provider may recommend:  Drinking more fluids. Drinking more makes your mucus thinner, which may make it easier to breathe.  Taking a medicine for a fever or cough.  Taking an antibiotic medicine.  Using an inhaler to help improve shortness of breath and to control a cough.  Using a cool mist vaporizer or humidifier to make it easier to breathe.  Follow these instructions at home: Medicines  Take over-the-counter and prescription medicines only as told by your health care provider.  If you were prescribed an antibiotic, take it as told by your health care provider. Do not stop taking the antibiotic even if you start to feel better. General instructions  Get plenty of rest.  Drink enough fluids to keep your urine clear or pale yellow.  Avoid smoking and secondhand smoke. Exposure to cigarette smoke or irritating chemicals will make bronchitis worse. If you smoke and you need help quitting, ask your health care provider. Quitting smoking will help your lungs heal faster.  Use an inhaler, cool mist vaporizer, or humidifier as told by your health care provider.  Keep all follow-up visits as told by your health care provider. This is important. How is this prevented? To lower your risk of getting this condition again:  Wash your hands often with soap and water. If soap and water are not available, use hand sanitizer.  Avoid contact with people who have cold symptoms.  Try not to touch  your hands to your mouth, nose, or eyes.  Make sure to get the flu shot every year.  Contact a health care provider if:  Your symptoms do not improve in 2 weeks of treatment. Get help right away if:  You cough up blood.  You have chest pain.  You have severe shortness of breath.  You become dehydrated.  You faint or keep feeling like you are going to faint.  You keep vomiting.  You have a severe headache.  Your fever or chills gets worse. This information is not intended to replace advice given to you by your health care provider. Make sure you discuss any questions you have with your health care provider. Document Released: 08/01/2004 Document Revised: 01/17/2016 Document Reviewed: 12/13/2015 Elsevier Interactive Patient Education  [employer].

## 2017-10-29 NOTE — Progress Notes (Signed)
Patient ID: [name], male    DOB: 1945-10-06, 72 y.o.   MRN: [id number]  PCP: [name], MD  Chief Complaint  Patient presents with  . Head & chest congestion, cough    Subjective:   [name] is a 72 y.o. male, presents to clinic with CC of 1 week of URI symptoms that began with sore throat nasal congestion, but then 2 days ago he felt go to his chest.  He had increased cough with gray sputum.  His nasal and throat symptoms seem to resolve at that time.  He continues to cough frequently, today developed a fever, sweats, generalized malaise.  He denies any chest pain, shortness of breath, weight loss, rash.  He denies any smoking history, he occasionally will get bronchitis but has not had any breathing treatments regularly or yearly.  Denies any exertional dyspnea, orthopnea, PND, lower extremity edema, chest pain or pressure, near-syncope, palpitations. Symptoms have been gradually worsening.  No over-the-counter medications attempted, nothing else makes it better or worse. History of bladder cancer, no current treatments, no immunosuppression Allergy to Augmentin Reports childhood history of recurrent bronchitis History of nephrectomy and chronic kidney disease stage III   Patient Active Problem List   Diagnosis Date Noted  . UTI (urinary tract infection) 08/24/2016  . Fever 08/24/2016  . History of nephrectomy 08/24/2016  . CKD (chronic kidney disease) stage 3, GFR 30-59 ml/min (HCC) 08/24/2016  . Flu-like symptoms 08/24/2016  . Sepsis due to Escherichia coli (E. coli) ([place]) 04/14/2015  . Bacteremia due to Gram-negative bacteria ([place]), E. Coli 04/14/2015  . UTI (lower urinary tract infection) 04/13/2015  . Acute renal failure superimposed on stage 3 chronic kidney disease ([place]) 04/13/2015  . Anemia of chronic kidney failure 04/13/2015  . Acute hyponatremia 04/13/2015  . Bladder cancer ([place]) 06/23/2013  . Hyperlipidemia   . GERD (gastroesophageal reflux  disease) 12/22/2012     Prior to Admission medications   Medication Sig Start Date End Date Taking? Authorizing Provider  acetaminophen (TYLENOL) 500 MG tablet Take 1 tablet (500 mg total) by mouth every 6 (six) hours as needed for mild pain, moderate pain or headache. 08/26/16  Yes Arrien, Jimmy Picket, MD  aspirin EC 81 MG tablet Take 81 mg by mouth at bedtime.   Yes [provider]  clonazePAM (KLONOPIN) 0.5 MG tablet Take 0.5 mg by mouth 2 (two) times daily.   Yes [provider]  colchicine 0.6 MG tablet Take 0.6-1.2 mg by mouth 2 (two) times daily as needed (for gout flare).    Yes [provider]  escitalopram (LEXAPRO) 20 MG tablet Take 2 tablets (40 mg total) by mouth at bedtime. 02/25/17  Yes [name], MD  ezetimibe (ZETIA) 10 MG tablet TAKE 1 TABLET BY MOUTH  EVERY MORNING 05/16/17  Yes [name], MD  febuxostat (ULORIC) 40 MG tablet Take 1 tablet (40 mg total) by mouth daily. 02/25/17  Yes [name], MD  fluticasone (FLONASE) 50 MCG/ACT nasal spray Place 2 sprays into both nostrils daily. 02/25/17  Yes [name], MD  omeprazole (PRILOSEC) 20 MG capsule Take 1 capsule (20 mg total) by mouth 2 (two) times daily before a meal. 02/25/17  Yes [name], [name], MD  rOPINIRole (REQUIP) 3 MG tablet Take 1 tablet (3 mg total) by mouth at bedtime. 02/25/17  Yes [name], MD  traZODone (DESYREL) 100 MG tablet Take 50 mg by mouth daily as needed  for sleep.    Yes [provider]     Allergies  Allergen Reactions  . Augmentin [Amoxicillin-Pot Clavulanate] Nausea And Vomiting and Other (See Comments)    Has patient had a PCN reaction causing immediate rash, facial/tongue/throat swelling, SOB or lightheadedness with hypotension: No Has patient had a PCN reaction causing severe rash involving mucus membranes or skin necrosis: No Has patient had a PCN reaction that required hospitalization No Has patient had a PCN  reaction occurring within the last 10 years: No If all of the above answers are "NO", then may proceed with Cephalosporin use.  . Statins Other (See Comments)    Reaction:  Muscle pain      Family History  Problem Relation Age of Onset  . Cancer Mother        ovarian  . Cirrhosis Father   . Alcohol abuse Father      Social History   Socioeconomic History  . Marital status: Married    Spouse name: Not on file  . Number of children: Not on file  . Years of education: Not on file  . Highest education level: Not on file  Occupational History  . Not on file  Social Needs  . Financial resource strain: Not on file  . Food insecurity:    Worry: Not on file    Inability: Not on file  . Transportation needs:    Medical: Not on file    Non-medical: Not on file  Tobacco Use  . Smoking status: Former Smoker    Packs/day: 0.50    Years: 30.00    Pack years: 15.00    Types: Cigarettes    Last attempt to quit: 07/24/1989    Years since quitting: 28.2  . Smokeless tobacco: Never Used  Substance and Sexual Activity  . Alcohol use: Yes    Comment: social  . Drug use: No  . Sexual activity: Not on file  Lifestyle  . Physical activity:    Days per week: Not on file    Minutes per session: Not on file  . Stress: Not on file  Relationships  . Social connections:    Talks on phone: Not on file    Gets together: Not on file    Attends religious service: Not on file    Active member of club or organization: Not on file    Attends meetings of clubs or organizations: Not on file    Relationship status: Not on file  . Intimate partner violence:    Fear of current or ex partner: Not on file    Emotionally abused: Not on file    Physically abused: Not on file    Forced sexual activity: Not on file  Other Topics Concern  . 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 Negative for activity change, appetite change, diaphoresis, fatigue and unexpected weight change.  HENT: Positive for postnasal drip. Negative for congestion, ear discharge, ear pain, sinus pressure, sinus pain and sore throat.   Eyes: Negative.  Negative for pain, discharge and itching.  Respiratory: Positive for cough. Negative for apnea, choking, chest tightness, shortness of breath, wheezing and stridor.   Cardiovascular: Negative.  Negative for chest pain, palpitations and leg swelling.  Gastrointestinal: Negative.  Negative for constipation, diarrhea, nausea and vomiting.  Endocrine: Negative.   Genitourinary: Negative.  Musculoskeletal: Negative.   Skin: Negative.  Negative for color change, pallor and rash.  Allergic/Immunologic: Negative.   Neurological: Negative.   Hematological: Negative.   Psychiatric/Behavioral: Negative.   All other systems reviewed and are negative.      Objective:    Vitals:   10/29/17 1405  BP: 118/62  Pulse: 76  Resp: 16  Temp: (!) 100.8 F (38.2 C)  TempSrc: Oral  SpO2: 95%  Weight: 200 lb (90.7 kg)  Height: 5\' 9"  (1.753 m)      Physical Exam  Constitutional: He is oriented to person, place, and time. He appears well-developed and well-nourished.  Non-toxic appearance. He does not appear ill. No distress.  HENT:  Head: Normocephalic and atraumatic.  Right Ear: Tympanic membrane, external ear and ear canal normal.  Left Ear: Tympanic membrane, external ear and ear canal normal.  Nose: Nose normal. No mucosal edema or rhinorrhea. Right sinus exhibits no maxillary sinus tenderness and no frontal sinus tenderness. Left sinus exhibits no maxillary sinus tenderness and no frontal sinus tenderness.  Mouth/Throat: Uvula is midline. No trismus in the jaw. No uvula swelling. No oropharyngeal exudate, posterior oropharyngeal edema or posterior oropharyngeal erythema.  Mild posterior oropharyngeal erythema, no edema, uvula midline, no exudate   Eyes: Pupils are equal, round, and reactive to light. Conjunctivae, EOM and lids are normal. Right eye exhibits no discharge. Left eye exhibits no discharge.  Neck: Trachea normal, normal range of motion and phonation normal. Neck supple. No tracheal deviation present.  Cardiovascular: Regular rhythm, normal heart sounds and normal pulses. Exam reveals no gallop and no friction rub.  No murmur heard. Pulses:      Radial pulses are 2+ on the right side, and 2+ on the left side.       Posterior tibial pulses are 2+ on the right side, and 2+ on the left side.  Pulmonary/Chest: Effort normal. No accessory muscle usage. No tachypnea. No respiratory distress. He has decreased breath sounds. He has wheezes. He has rhonchi.  Frequent cough, diminished bilaterally at the bases with scattered rhonchi and occasional and end expiratory wheeze  Abdominal: Soft. Normal appearance and bowel sounds are normal. He exhibits no distension. There is no tenderness. There is no rebound and no guarding.  Musculoskeletal: Normal range of motion. He exhibits no edema.  Neurological: He is alert and oriented to person, place, and time. Gait normal.  Skin: Skin is warm, dry and intact. Capillary refill takes less than 2 seconds. No rash noted. He is not diaphoretic.  Psychiatric: He has a normal mood and affect. His speech is normal and behavior is normal.  Nursing note and vitals reviewed.         Assessment & Plan:   72 year old male, mildly ill-appearing, presents febrile cold complaints of productive cough after URI No distress on exam, lungs with scattered rhonchi and some faint wheeze, given a breathing treatment in clinic and reevaluated.  Breath sounds did improve that he had coarse crackles to the right lower lung field breathing treatment, will treat for community acquired pneumonia.  He and stated that the breathing treatment did help his sensation of ease of breathing so will send an inhaler home with  cough suppressants, steroids and antibiotics.  Follow-up with clinic if not improving in 1 week, he understands ER precautions and verbalized understanding.  He left the clinic in good condition without any respiratory distress.     ICD-10-CM   1. Cough R05 ipratropium-albuterol (DUONEB) 0.5-2.5 (3) MG/3ML nebulizer  solution 3 mL    predniSONE (DELTASONE) 20 MG tablet    benzonatate (TESSALON) 100 MG capsule    HYDROcodone-homatropine (HYCODAN) 5-1.5 MG/5ML syrup  2. Community acquired pneumonia of right lower lobe of lung ([place]) J18.1 ipratropium-albuterol (DUONEB) 0.5-2.5 (3) MG/3ML nebulizer solution 3 mL    predniSONE (DELTASONE) 20 MG tablet    guaiFENesin (MUCINEX) 600 MG 12 hr tablet    benzonatate (TESSALON) 100 MG capsule    albuterol (PROVENTIL HFA;VENTOLIN HFA) 108 (90 Base) MCG/ACT inhaler    azithromycin (ZITHROMAX) 250 MG tablet    HYDROcodone-homatropine (HYCODAN) 5-1.5 MG/5ML syrup       Delsa Grana, PA-C 10/30/17 5:37 PM

## 2017-11-15 IMAGING — CR DG CHEST 2V
2 series · 2 of 2 positions shown · non-contrast
Comparison: 06/05/2016

CLINICAL DATA: Fatigue and fever.

EXAM:
CHEST  2 VIEW

[w chest pa]
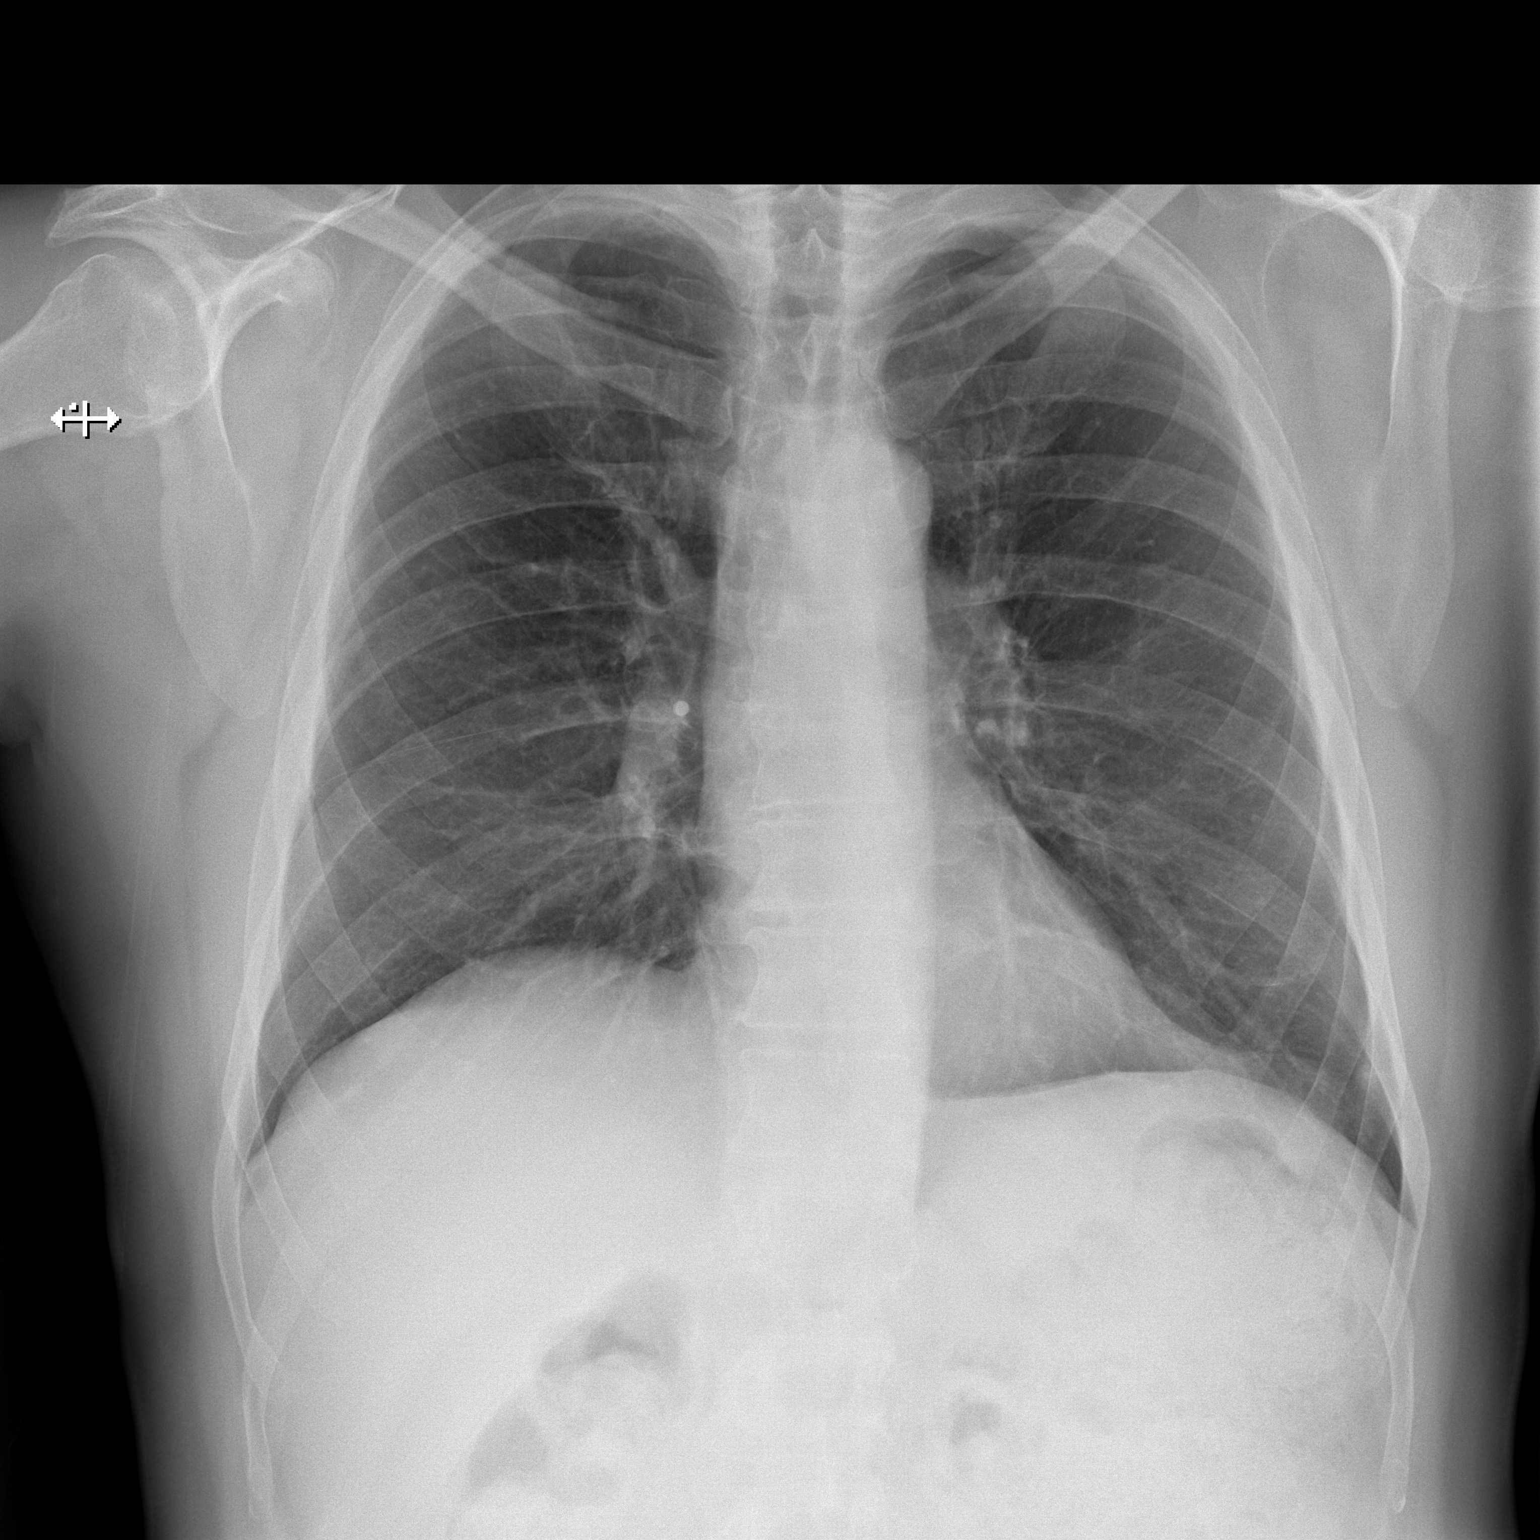

[w chest lat]
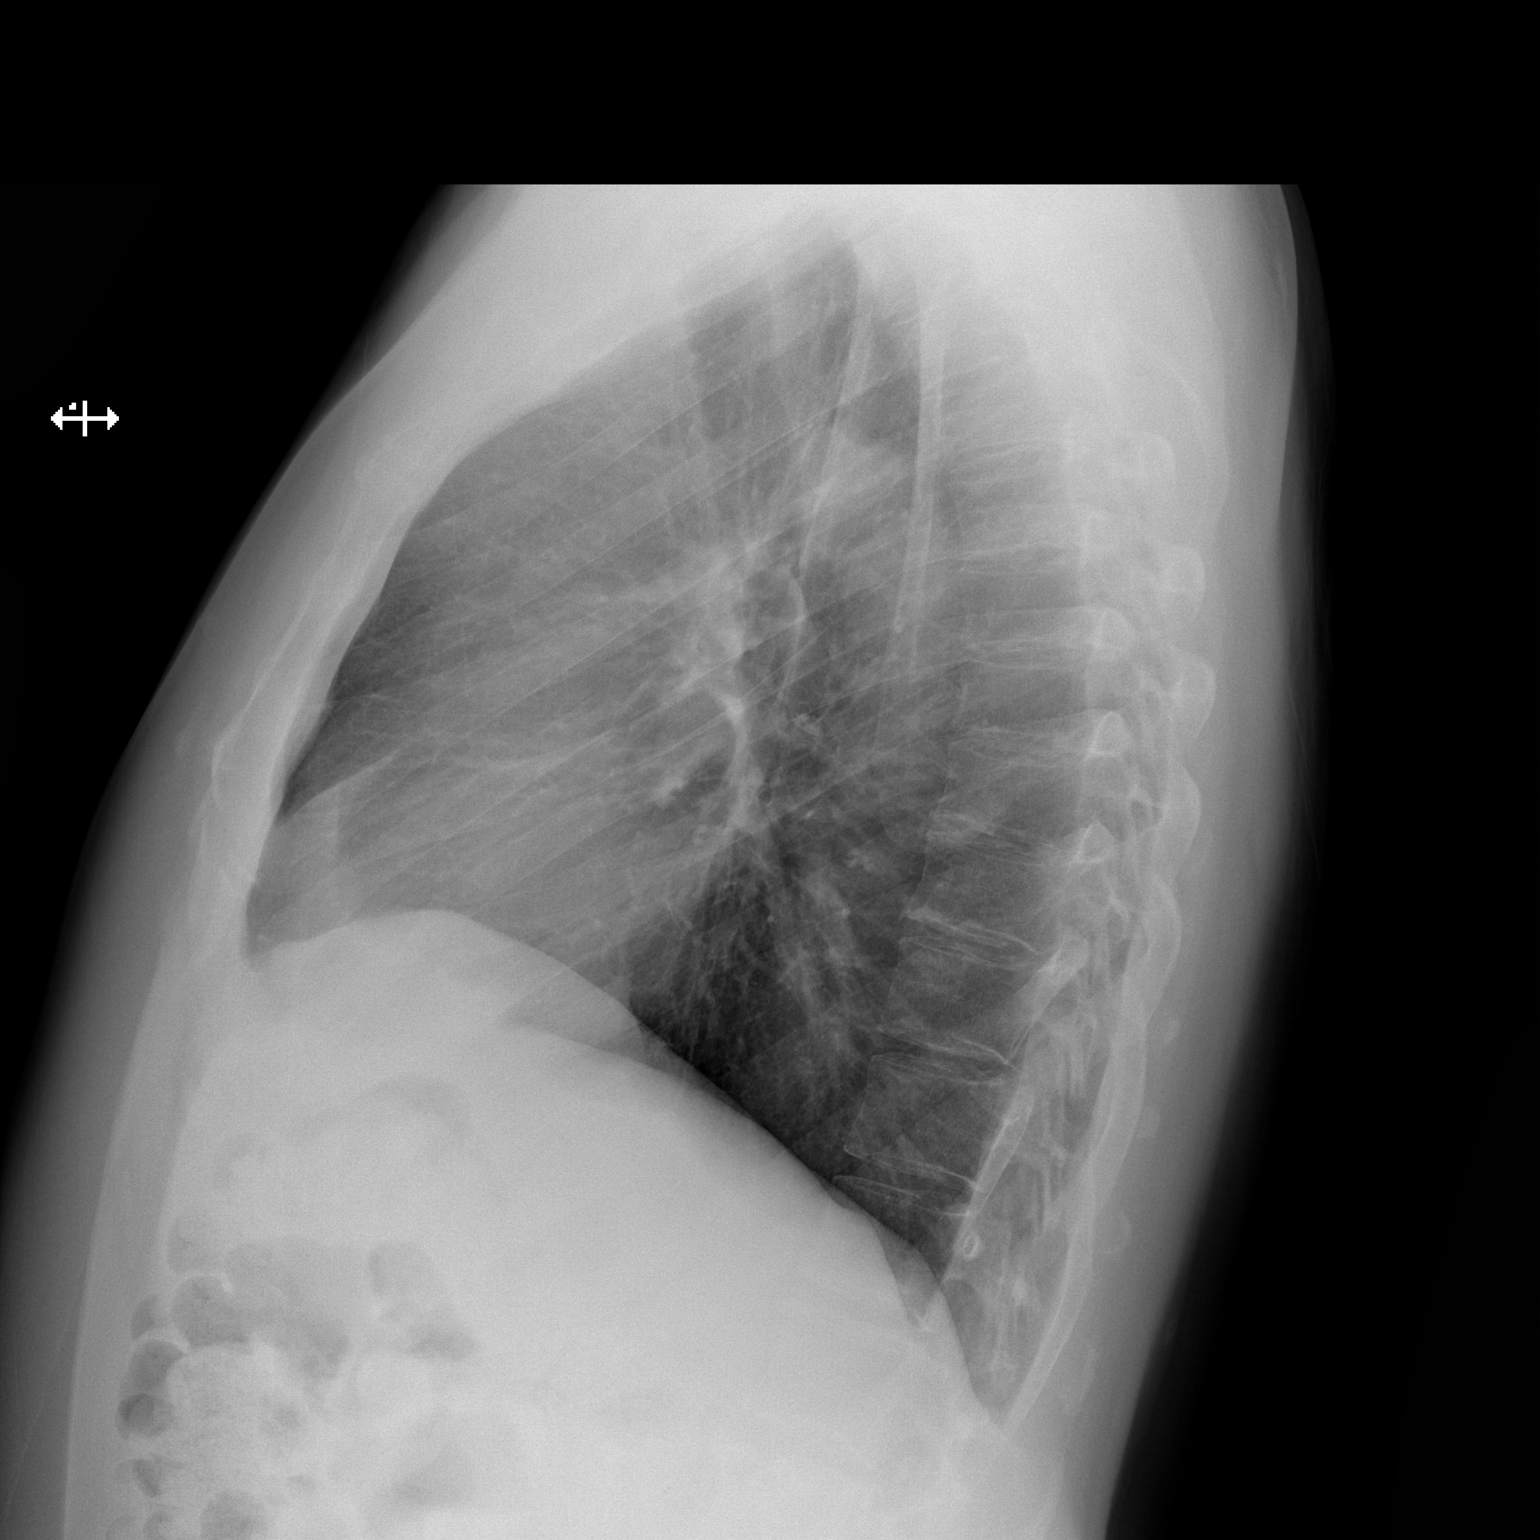

[2 of 2 positions shown; findings below may reference images not displayed]

FINDINGS: The lungs are clear wiithout focal pneumonia, edema, pneumothorax or
pleural effusion. Granuloma identified left costophrenic angle, as
confirmed by CT of 04/12/2015. The cardiopericardial silhouette is
within normal limits for size. The visualized bony structures of the
thorax are intact.
IMPRESSION: Stable.  No acute findings.

## 2017-12-04 DIAGNOSIS — D2372 Other benign neoplasm of skin of left lower limb, including hip: Secondary | ICD-10-CM | POA: Diagnosis not present

## 2017-12-04 DIAGNOSIS — D2371 Other benign neoplasm of skin of right lower limb, including hip: Secondary | ICD-10-CM | POA: Diagnosis not present

## 2017-12-04 DIAGNOSIS — L820 Inflamed seborrheic keratosis: Secondary | ICD-10-CM | POA: Diagnosis not present

## 2017-12-04 DIAGNOSIS — L821 Other seborrheic keratosis: Secondary | ICD-10-CM | POA: Diagnosis not present

## 2017-12-04 DIAGNOSIS — D225 Melanocytic nevi of trunk: Secondary | ICD-10-CM | POA: Diagnosis not present

## 2017-12-04 DIAGNOSIS — D1801 Hemangioma of skin and subcutaneous tissue: Secondary | ICD-10-CM | POA: Diagnosis not present

## 2017-12-11 ENCOUNTER — Other Ambulatory Visit: Payer: Self-pay | Admitting: Urology

## 2017-12-11 ENCOUNTER — Ambulatory Visit (HOSPITAL_COMMUNITY)
Admission: RE | Admit: 2017-12-11 | Discharge: 2017-12-11 | Disposition: A | Discharge status: Home / Self Care | Payer: Medicare Other | Source: Ambulatory Visit | Attending: Urology | Admitting: Urology

## 2017-12-11 DIAGNOSIS — Z8546 Personal history of malignant neoplasm of prostate: Secondary | ICD-10-CM | POA: Diagnosis not present

## 2017-12-11 DIAGNOSIS — F411 Generalized anxiety disorder: Secondary | ICD-10-CM | POA: Diagnosis not present

## 2017-12-11 DIAGNOSIS — C679 Malignant neoplasm of bladder, unspecified: Secondary | ICD-10-CM | POA: Diagnosis not present

## 2017-12-11 DIAGNOSIS — Z8551 Personal history of malignant neoplasm of bladder: Secondary | ICD-10-CM | POA: Diagnosis not present

## 2017-12-11 DIAGNOSIS — F332 Major depressive disorder, recurrent severe without psychotic features: Secondary | ICD-10-CM | POA: Diagnosis not present

## 2017-12-11 DIAGNOSIS — Z634 Disappearance and death of family member: Secondary | ICD-10-CM | POA: Diagnosis not present

## 2017-12-18 DIAGNOSIS — N5232 Erectile dysfunction following radical cystectomy: Secondary | ICD-10-CM | POA: Diagnosis not present

## 2017-12-18 DIAGNOSIS — N1330 Unspecified hydronephrosis: Secondary | ICD-10-CM | POA: Diagnosis not present

## 2017-12-18 DIAGNOSIS — N486 Induration penis plastica: Secondary | ICD-10-CM | POA: Diagnosis not present

## 2017-12-18 DIAGNOSIS — Z8546 Personal history of malignant neoplasm of prostate: Secondary | ICD-10-CM | POA: Diagnosis not present

## 2017-12-18 DIAGNOSIS — Z8551 Personal history of malignant neoplasm of bladder: Secondary | ICD-10-CM | POA: Diagnosis not present

## 2018-02-24 ENCOUNTER — Inpatient Hospital Stay: Payer: Medicare Other | Attending: Oncology | Admitting: Oncology

## 2018-02-24 ENCOUNTER — Telehealth: Payer: Self-pay

## 2018-02-24 VITALS — BP 117/65 | HR 66 | Temp 98.7°F | Resp 18 | Ht 69.0 in | Wt 202.1 lb

## 2018-02-24 DIAGNOSIS — N289 Disorder of kidney and ureter, unspecified: Secondary | ICD-10-CM | POA: Insufficient documentation

## 2018-02-24 DIAGNOSIS — Z79899 Other long term (current) drug therapy: Secondary | ICD-10-CM

## 2018-02-24 DIAGNOSIS — Z9221 Personal history of antineoplastic chemotherapy: Secondary | ICD-10-CM | POA: Diagnosis not present

## 2018-02-24 DIAGNOSIS — Z8551 Personal history of malignant neoplasm of bladder: Secondary | ICD-10-CM

## 2018-02-24 DIAGNOSIS — Z7982 Long term (current) use of aspirin: Secondary | ICD-10-CM

## 2018-02-24 DIAGNOSIS — N2889 Other specified disorders of kidney and ureter: Secondary | ICD-10-CM | POA: Diagnosis not present

## 2018-02-24 DIAGNOSIS — C679 Malignant neoplasm of bladder, unspecified: Secondary | ICD-10-CM

## 2018-02-24 DIAGNOSIS — Z905 Acquired absence of kidney: Secondary | ICD-10-CM

## 2018-02-24 NOTE — Telephone Encounter (Signed)
Printed avs and calender of upcoming appointment. Per 8/20 los 

## 2018-02-24 NOTE — Progress Notes (Signed)
Hematology and Oncology Follow Up Visit  [name] [id number] 03-Jan-1946 72 y.o. 02/24/2018 1:11 PM [name], MDPickard, [name], MD   Principle Diagnosis: 72 year old man with T2N0 bladder cancer diagnosed in October of 2014.  He remains disease-free after surgical resection.  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 He is S/P neoadjuvant chemotherapy with cisplatinum and Gemzar started on 06/11/2013. He is status post 3 cycle of chemotherapy completed in January of 2015. He underwent a cystectomy on 10/05/2013 at [facility]. The pathology revealed no residual tumor with 12 lymph nodes sampled noninvolved with malignancy. 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 .  Current therapy: Active surveillance  Interim History: Mr. [name] presents today for a follow-up.  Since the last visit, he reports no major changes in his health.  He did develop upper respiratory tract infection in the spring and has resolved at this time.  He was taking antibiotics and cough medication and has discontinued all that now.  His urine output is excellent without any dysuria or hematuria.  He is performance status and quality of life remain excellent.   He does not report any headaches, blurry vision, syncope or seizures.  He denies any alteration mental status or confusion.  He does not report any fevers, chills or sweats.  He has not reported any nausea or vomiting or abdominal pain.  He denies any constipation or diarrhea.  Has not reported any frequency urgency or hesitancy. He denies any chest pain shortness of breath cough or hemoptysis.  Does not report any bone pain or pathological fractures.  Does not report any lymphadenopathy or petechiae.  Rest of his review of systems is negative.   Medications: I have reviewed the patient's current medications.  Current  Outpatient Medications  Medication Sig Dispense Refill  . acetaminophen (TYLENOL) 500 MG tablet Take 1 tablet (500 mg total) by mouth every 6 (six) hours as needed for mild pain, moderate pain or headache. 30 tablet 0  . albuterol (PROVENTIL HFA;VENTOLIN HFA) 108 (90 Base) MCG/ACT inhaler Inhale 2 puffs into the lungs every 6 (six) hours as needed for wheezing or shortness of breath. 1 Inhaler 0  . aspirin EC 81 MG tablet Take 81 mg by mouth at bedtime.    [name] azithromycin (ZITHROMAX) 250 MG tablet Take 2 tablets by mouth (500 mg) on day 1, then take one tablet by mouth (250 mg) daily day 2-5 6 tablet 0  . benzonatate (TESSALON) 100 MG capsule Take 1 capsule (100 mg total) by mouth 3 (three) times daily as needed for cough. 30 capsule 0  . clonazePAM (KLONOPIN) 0.5 MG tablet Take 0.5 mg by mouth 2 (two) times daily.  0  . colchicine 0.6 MG tablet Take 0.6-1.2 mg by mouth 2 (two) times daily as needed (for gout flare).     [name] escitalopram (LEXAPRO) 20 MG tablet Take 2 tablets (40 mg total) by mouth at bedtime. 180 tablet 3  . ezetimibe (ZETIA) 10 MG tablet TAKE 1 TABLET BY MOUTH  EVERY MORNING 90 tablet 1  . febuxostat (ULORIC) 40 MG tablet Take 1 tablet (40 mg total) by mouth daily. 90 tablet 3  . fluticasone (FLONASE) 50 MCG/ACT nasal spray Place 2 sprays into both nostrils daily. 48 g 3  . guaiFENesin (MUCINEX) 600 MG 12 hr tablet Take 1 tablet (600 mg total) by mouth 2 (two) times daily  as needed for cough or to loosen phlegm. 30 tablet 0  . HYDROcodone-homatropine (HYCODAN) 5-1.5 MG/5ML syrup Take 5 mLs by mouth every 8 (eight) hours as needed for cough. 120 mL 0  . omeprazole (PRILOSEC) 20 MG capsule Take 1 capsule (20 mg total) by mouth 2 (two) times daily before a meal. 180 capsule 3  . predniSONE (DELTASONE) 20 MG tablet Take 3 daily for 2 days, then 2 daily for 2 days, then 1 daily for 2 days. 12 tablet 0  . rOPINIRole (REQUIP) 3 MG tablet Take 1 tablet (3 mg total) by mouth at bedtime. 90  tablet 3  . traZODone (DESYREL) 100 MG tablet Take 50 mg by mouth daily as needed for sleep.      No current facility-administered medications for this visit.      Allergies:  Allergies  Allergen Reactions  . Augmentin [Amoxicillin-Pot Clavulanate] Nausea And Vomiting and Other (See Comments)    Has patient had a PCN reaction causing immediate rash, facial/tongue/throat swelling, SOB or lightheadedness with hypotension: No Has patient had a PCN reaction causing severe rash involving mucus membranes or skin necrosis: No Has patient had a PCN reaction that required hospitalization No Has patient had a PCN reaction occurring within the last 10 years: No If all of the above answers are "NO", then may proceed with Cephalosporin use.  . Statins Other (See Comments)    Reaction:  Muscle pain     Past Medical History, Surgical history, Social history, and Family updated and remain unchanged.    Physical Exam:  Blood pressure 117/65, pulse 66, temperature 98.7 F (37.1 C), temperature source Oral, resp. rate 18, height 5\' 9"  (1.753 m), weight 202 lb 1.6 oz (91.7 kg), SpO2 97 %.   ECOG: 0 General appearance: Well-appearing gentleman without distress. Head: Atraumatic without abnormalities. Eyes: Pupils are equal and round reactive to light. Oropharynx: Mucous membranes are moist and pink. Lymph nodes: No lymphadenopathy noted any cervical, supraclavicular, or axillary lymph nodes. Heart:regular rate and rhythm, S1, S2 without leg edema. Lung: Clear without any rhonchi, wheezes or dullness to percussion. Abdomen: soft, non-tender, without any rebound or guarding.  No shifting dullness or ascites. Musculoskeletal: No clubbing or cyanosis. Skin: No rashes or lesions.   Lab Results: Lab Results  Component Value Date   WBC 5.1 03/07/2017   HGB 14.0 03/07/2017   HCT 42.5 03/07/2017   MCV 90.4 03/07/2017   PLT 218 03/07/2017     Chemistry      Component Value Date/Time   NA 138  03/07/2017 1100   NA 138 12/26/2015 1045   K 5.0 03/07/2017 1100   K 4.8 12/26/2015 1045   CL 105 03/07/2017 1100   CO2 20 03/07/2017 1100   CO2 23 12/26/2015 1045   BUN 25 03/07/2017 1100   BUN 22.4 12/26/2015 1045   CREATININE 2.06 (H) 03/07/2017 1100   CREATININE 2.3 (H) 12/26/2015 1045      Component Value Date/Time   CALCIUM 8.9 03/07/2017 1100   CALCIUM 9.1 12/26/2015 1045   ALKPHOS 105 03/07/2017 1100   ALKPHOS 124 12/26/2015 1045   AST 19 03/07/2017 1100   AST 16 12/26/2015 1045   ALT 14 03/07/2017 1100   ALT 17 12/26/2015 1045   BILITOT 0.5 03/07/2017 1100   BILITOT 0.34 12/26/2015 1045      Chest x-ray and ultrasound obtained in June 2019 showed no evidence of metastatic disease.  Impression and Plan:  89 -year old with:  1.  T2N0 bladder cancer diagnosed in October of 2014.  He remains disease-free after radical cystectomy in 2015.  The natural course of this disease was reviewed today and risk of relapse were also assessed.  He is approaching 5 years from his operation but still carries small risk of metastatic disease.  I recommended continued observation and surveillance to complete at least 5 years of therapy.  The plan is to complete imaging studies in the end of 2019 and that will conclude 5 years of surveillance.  No further follow-up from oncology standpoint after that is needed.   2. Renal insufficiency: Creatinine remains close to baseline without any changes at this time.  3. Followup: In 6 months.  This appointment can be canceled if his imaging studies all within normal range.  15  minutes was spent with the patient face-to-face today.  More than 50% of time was dedicated to discussing the natural course of his disease, future treatment options and coordinating his care.   Zola Button 8/20/20191:11 PM

## 2018-02-26 ENCOUNTER — Other Ambulatory Visit: Payer: Self-pay | Admitting: Family Medicine

## 2018-03-03 ENCOUNTER — Encounter: Payer: Self-pay | Admitting: Family Medicine

## 2018-03-03 ENCOUNTER — Ambulatory Visit (INDEPENDENT_AMBULATORY_CARE_PROVIDER_SITE_OTHER): Payer: Medicare Other | Admitting: Family Medicine

## 2018-03-03 VITALS — BP 118/70 | HR 56 | Temp 97.9°F | Resp 16 | Ht 69.0 in | Wt 204.0 lb

## 2018-03-03 DIAGNOSIS — M549 Dorsalgia, unspecified: Secondary | ICD-10-CM | POA: Diagnosis not present

## 2018-03-03 DIAGNOSIS — Z23 Encounter for immunization: Secondary | ICD-10-CM | POA: Diagnosis not present

## 2018-03-03 DIAGNOSIS — R509 Fever, unspecified: Secondary | ICD-10-CM

## 2018-03-03 LAB — URINALYSIS, ROUTINE W REFLEX MICROSCOPIC
Bilirubin Urine: NEGATIVE
GLUCOSE, UA: NEGATIVE
Ketones, ur: NEGATIVE
Nitrite: POSITIVE — AB
Specific Gravity, Urine: 1.01 (ref 1.001–1.03)
Squamous Epithelial / LPF: NONE SEEN /HPF (ref <=5)
pH: 7 (ref 5.0–8.0)

## 2018-03-03 LAB — MICROSCOPIC MESSAGE

## 2018-03-03 MED ORDER — ALLOPURINOL 300 MG PO TABS: 300.0000 mg | ORAL_TABLET | Freq: Every day | ORAL | 3 refills | Status: DC

## 2018-03-03 MED ORDER — CIPROFLOXACIN HCL 500 MG PO TABS: 500.0000 mg | ORAL_TABLET | Freq: Two times a day (BID) | ORAL | 0 refills | Status: DC

## 2018-03-03 NOTE — Progress Notes (Signed)
Subjective:    Patient ID: [name], male    DOB: July 14, 1945, 72 y.o.   MRN: [id number]  HPI 2 weeks ago, the patient was walking his dog.  Dog was on a leash.  It was running around him in circles.  Suddenly yanked him while running behind him pulling him directly to the ground supine on his back.  Ever since that time, he has had pain present in the right mid lower back.  Pain is made worse by twisting and movement.  He denies any pain to palpation he has mild CVA tenderness.  He denies any cough or hemoptysis.  Patient has a history of a bladder resection due to bladder cancer and has a permanent ostomy bag.  Therefore he does not experience dysuria.  However he has been having fevers all the way up to 101 since the event occurred.  Also felt weak and tired occasionally.  The worst was the last Saturday.  He contemplated going to the emergency room but eventually the fever subsided.  Urinalysis today shows +2 blood, leukocyte esterase, and nitrites.  He has a history of bacteremia due to gram-negative bacteria from urinary tract infections.  They tend to have a insidious course and make him extremely sick Past Medical History:  Diagnosis Date  . Allergy   . Bacteremia due to Gram-negative bacteria 04/13/2015  . Bladder rupture 2010  . Bladder tumor   . Cancer ([place])    bladder  . Depression   . Gout   . 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 Hyperlipidemia   . Restless leg syndrome, controlled    Past Surgical History:  Procedure Laterality Date  . APPENDECTOMY    . BLADDER SURGERY  2010  . CYSTOSCOPY W/ RETROGRADES Bilateral 04/21/2013   Procedure: CYSTOSCOPY WITH RETROGRADE PYELOGRAM;  Surgeon: Molli Hazard, MD;  Location: [facility];  Service: Urology;  Laterality: Bilateral;  . FRACTURE SURGERY Right 01/05/2005   lower leg/ankle  . NASAL SEPTUM SURGERY    . TONSILLECTOMY AND ADENOIDECTOMY    . TRANSURETHRAL RESECTION OF BLADDER TUMOR WITH GYRUS (TURBT-GYRUS)  N/A 04/21/2013   Procedure: TRANSURETHRAL RESECTION OF BLADDER TUMOR WITH GYRUS (TURBT-GYRUS), cystoscopy;  Surgeon: Molli Hazard, MD;  Location: [facility];  Service: Urology;  Laterality: N/A;   Current Outpatient Medications on File Prior to Visit  Medication Sig Dispense Refill  . acetaminophen (TYLENOL) 500 MG tablet Take 1 tablet (500 mg total) by mouth every 6 (six) hours as needed for mild pain, moderate pain or headache. 30 tablet 0  . albuterol (PROVENTIL HFA;VENTOLIN HFA) 108 (90 Base) MCG/ACT inhaler Inhale 2 puffs into the lungs every 6 (six) hours as needed for wheezing or shortness of breath. 1 Inhaler 0  . aspirin EC 81 MG tablet Take 81 mg by mouth at bedtime.    . clonazePAM (KLONOPIN) 0.5 MG tablet Take 0.5 mg by mouth 2 (two) times daily.  0  . colchicine 0.6 MG tablet Take 0.6-1.2 mg by mouth 2 (two) times daily as needed (for gout flare).     [name] escitalopram (LEXAPRO) 20 MG tablet Take 2 tablets (40 mg total) by mouth at bedtime. 180 tablet 3  . ezetimibe (ZETIA) 10 MG tablet TAKE 1 TABLET BY MOUTH  EVERY MORNING 90 tablet 1  . fluticasone (FLONASE) 50 MCG/ACT nasal spray USE 2 SPRAYS IN EACH  NOSTRIL DAILY 48 g 3  . guaiFENesin (MUCINEX) 600 MG 12 hr tablet Take 1  tablet (600 mg total) by mouth 2 (two) times daily as needed for cough or to loosen phlegm. 30 tablet 0  . omeprazole (PRILOSEC) 20 MG capsule Take 1 capsule (20 mg total) by mouth 2 (two) times daily before a meal. 180 capsule 3  . rOPINIRole (REQUIP) 3 MG tablet Take 1 tablet (3 mg total) by mouth at bedtime. 90 tablet 3  . traZODone (DESYREL) 100 MG tablet Take 50 mg by mouth daily as needed for sleep.      No current facility-administered medications on file prior to visit.    Allergies  Allergen Reactions  . Augmentin [Amoxicillin-Pot Clavulanate] Nausea And Vomiting and Other (See Comments)    Has patient had a PCN reaction causing immediate rash, facial/tongue/throat swelling, SOB or lightheadedness  with hypotension: No Has patient had a PCN reaction causing severe rash involving mucus membranes or skin necrosis: No Has patient had a PCN reaction that required hospitalization No Has patient had a PCN reaction occurring within the last 10 years: No If all of the above answers are "NO", then may proceed with Cephalosporin use.  . Statins Other (See Comments)    Reaction:  Muscle pain    Social History   Socioeconomic History  . Marital status: Married    Spouse name: Not on file  . Number of children: Not on file  . Years of education: Not on file  . Highest education level: Not on file  Occupational History  . Not on file  Social Needs  . Financial resource strain: Not on file  . Food insecurity:    Worry: Not on file    Inability: Not on file  . Transportation needs:    Medical: Not on file    Non-medical: Not on file  Tobacco Use  . Smoking status: Former Smoker    Packs/day: 0.50    Years: 30.00    Pack years: 15.00    Types: Cigarettes    Last attempt to quit: 07/24/1989    Years since quitting: 28.6  . Smokeless tobacco: Never Used  Substance and Sexual Activity  . Alcohol use: Yes    Comment: social  . Drug use: No  . Sexual activity: Not on file  Lifestyle  . Physical activity:    Days per week: Not on file    Minutes per session: Not on file  . Stress: Not on file  Relationships  . Social connections:    Talks on phone: Not on file    Gets together: Not on file    Attends religious service: Not on file    Active member of club or organization: Not on file    Attends meetings of clubs or organizations: Not on file    Relationship status: Not on file  . Intimate partner violence:    Fear of current or ex partner: Not on file    Emotionally abused: Not on file    Physically abused: Not on file    Forced sexual activity: Not on file  Other Topics Concern  . 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  All other systems reviewed and are negative.      Objective:   Physical Exam  Constitutional: He appears well-developed and well-nourished. No distress.  Neck: Neck supple.  Cardiovascular: Normal rate, regular rhythm and normal heart sounds.  No murmur heard. Pulmonary/Chest: Effort normal and breath sounds normal. No respiratory distress. He has no wheezes. He has no rales.  Abdominal: Soft. Bowel sounds are normal. He exhibits no distension. There is no tenderness. There is no rebound and no guarding.  Musculoskeletal: He exhibits no edema.       Thoracic back: He exhibits tenderness and pain. He exhibits normal range of motion and no spasm.       Back:  Lymphadenopathy:    He has no cervical adenopathy.  Skin: He is not diaphoretic.  Psychiatric: He has a normal mood and affect. His behavior is normal. Judgment and thought content normal.  Vitals reviewed.         Assessment & Plan:  Other acute back pain - Plan: Urinalysis, Routine w reflex microscopic, Urine Culture  Need for prophylactic vaccination and inoculation against influenza - Plan: Flu Vaccine QUAD 36+ mos IM  Fever, unspecified fever cause  Office Visit on 03/03/2018  Component Date Value Ref Range Status  . Color, Urine 03/03/2018 YELLOW  YELLOW Final  . APPearance 03/03/2018 CLOUDY* CLEAR Final  . Specific Gravity, Urine 03/03/2018 1.010  1.001 - 1.03 Final  . pH 03/03/2018 7.0  5.0 - 8.0 Final  . Glucose, UA 03/03/2018 NEGATIVE  NEGATIVE Final  . Bilirubin Urine 03/03/2018 NEGATIVE  NEGATIVE Final  . Ketones, ur 03/03/2018 NEGATIVE  NEGATIVE Final  . Hgb urine dipstick 03/03/2018 2+* NEGATIVE Final  . Protein, ur 03/03/2018 1+* NEGATIVE Final  . Nitrite 03/03/2018 POSITIVE* NEGATIVE Final  . Leukocytes, UA 03/03/2018 2+* NEGATIVE Final  . WBC, UA 03/03/2018 6-10* 0 - 5 /HPF Final  . RBC / HPF 03/03/2018 0-2  0 - 2 /HPF Final   . Squamous Epithelial / LPF 03/03/2018 NONE SEEN  < OR = 5 /HPF Final  . Bacteria, UA 03/03/2018 MODERATE* NONE SEEN /HPF Final  . Note 03/03/2018    Final   Comment: This urine was analyzed for the presence of WBC,  RBC, bacteria, casts, and other formed elements.  Only those elements seen were reported. . .    I suspect the majority of his pain is muscular and related to a muscle injury that he experienced while falling.  I expect this to gradually improve over the next few weeks.  However I am very concerned by his fever as well as his analysis.  Given his previous history, I will treat the patient for possible pyelonephritis with Cipro 500 mg p.o. twice daily for 7 days and also send off a urine culture.  Regarding his muscular pain, I have recommended tincture of time and anticipate spontaneous improvement over the next 2 weeks.  Recheck in 1 week if no better or sooner if worse  Also his mail-order insurance will no longer cover Uloric and therefore we will switch him to allopurinol 300 mg a day for cost reasons.

## 2018-03-05 LAB — URINE CULTURE
MICRO NUMBER: 91023380
SPECIMEN QUALITY: ADEQUATE

## 2018-04-06 ENCOUNTER — Ambulatory Visit (INDEPENDENT_AMBULATORY_CARE_PROVIDER_SITE_OTHER): Payer: Medicare Other | Admitting: Family Medicine

## 2018-04-06 ENCOUNTER — Encounter: Payer: Self-pay | Admitting: Family Medicine

## 2018-04-06 VITALS — BP 110/68 | HR 60 | Temp 98.0°F | Resp 14 | Ht 69.0 in | Wt 206.0 lb

## 2018-04-06 DIAGNOSIS — Z8551 Personal history of malignant neoplasm of bladder: Secondary | ICD-10-CM

## 2018-04-06 DIAGNOSIS — Z13220 Encounter for screening for lipoid disorders: Secondary | ICD-10-CM | POA: Diagnosis not present

## 2018-04-06 DIAGNOSIS — Z125 Encounter for screening for malignant neoplasm of prostate: Secondary | ICD-10-CM | POA: Diagnosis not present

## 2018-04-06 DIAGNOSIS — Z0000 Encounter for general adult medical examination without abnormal findings: Secondary | ICD-10-CM

## 2018-04-06 DIAGNOSIS — Z905 Acquired absence of kidney: Secondary | ICD-10-CM | POA: Diagnosis not present

## 2018-04-06 DIAGNOSIS — N183 Chronic kidney disease, stage 3 unspecified: Secondary | ICD-10-CM

## 2018-04-06 NOTE — Progress Notes (Signed)
Subjective:    Patient ID: [name], male    DOB: 06-29-1946, 72 y.o.   MRN: [id number]  HPI Patient is a pleasant 72 y/o WM here today for complete physical exam.  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 His bladder has been completely removed. They also removed his prostate at the same time. They did find a small focus of prostate cancer when he did this and therefore he is due for PSA.  He has already had his flu shot.  Hepatitis C screening is up-to-date.  He is also due for Shingrix and Tdap.   Immunization History  Administered Date(s) Administered  . Influenza Split 05/25/2013  . Influenza,inj,Quad PF,6+ Mos 04/22/2014, 04/28/2015, 05/09/2016, 03/07/2017, 03/03/2018  . Pneumococcal Conjugate-13 04/22/2014  . Pneumococcal Polysaccharide-23 04/28/2015  . Zoster 01/27/2009   Past Medical History:  Diagnosis Date  . Allergy   . Bacteremia due to Gram-negative bacteria 04/13/2015  . Bladder rupture 2010  . Bladder tumor   . Cancer ([place])    bladder  . Depression   . Gout   . 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 Hyperlipidemia   . Restless leg syndrome, controlled    Past Surgical History:  Procedure Laterality Date  . APPENDECTOMY    . BLADDER SURGERY  2010  . CYSTOSCOPY W/ RETROGRADES Bilateral 04/21/2013   Procedure: CYSTOSCOPY WITH RETROGRADE PYELOGRAM;  Surgeon: Molli Hazard, MD;  Location: [facility];  Service: Urology;  Laterality: Bilateral;  . FRACTURE SURGERY Right 01/05/2005   lower leg/ankle  . NASAL SEPTUM SURGERY    . TONSILLECTOMY AND ADENOIDECTOMY    . TRANSURETHRAL RESECTION OF BLADDER TUMOR WITH GYRUS (TURBT-GYRUS) N/A 04/21/2013   Procedure: TRANSURETHRAL RESECTION OF BLADDER TUMOR WITH GYRUS (TURBT-GYRUS), cystoscopy;  Surgeon: Molli Hazard, MD;  Location: [facility];  Service: Urology;  Laterality: N/A;   Current Outpatient Medications on File Prior to Visit  Medication Sig Dispense Refill  . acetaminophen (TYLENOL) 500 MG  tablet Take 1 tablet (500 mg total) by mouth every 6 (six) hours as needed for mild pain, moderate pain or headache. 30 tablet 0  . allopurinol (ZYLOPRIM) 300 MG tablet Take 1 tablet (300 mg total) by mouth daily. 90 tablet 3  . aspirin EC 81 MG tablet Take 81 mg by mouth at bedtime.    . clonazePAM (KLONOPIN) 0.5 MG tablet Take 0.5 mg by mouth 2 (two) times daily.  0  . colchicine 0.6 MG tablet Take 0.6-1.2 mg by mouth 2 (two) times daily as needed (for gout flare).     [name] escitalopram (LEXAPRO) 20 MG tablet Take 2 tablets (40 mg total) by mouth at bedtime. 180 tablet 3  . ezetimibe (ZETIA) 10 MG tablet TAKE 1 TABLET BY MOUTH  EVERY MORNING 90 tablet 1  . fluticasone (FLONASE) 50 MCG/ACT nasal spray USE 2 SPRAYS IN EACH  NOSTRIL DAILY 48 g 3  . omeprazole (PRILOSEC) 20 MG capsule Take 1 capsule (20 mg total) by mouth 2 (two) times daily before a meal. (Patient taking differently: Take 20 mg by mouth daily. ) 180 capsule 3  . rOPINIRole (REQUIP) 3 MG tablet Take 1 tablet (3 mg total) by mouth at bedtime. (Patient taking differently: Take 4 mg by mouth at bedtime. ) 90 tablet 3  . traZODone (DESYREL) 100 MG tablet Take 50 mg by mouth daily as needed for sleep.      No current facility-administered medications on file prior to visit.  Allergies  Allergen Reactions  . Augmentin [Amoxicillin-Pot Clavulanate] Nausea And Vomiting and Other (See Comments)    Has patient had a PCN reaction causing immediate rash, facial/tongue/throat swelling, SOB or lightheadedness with hypotension: No Has patient had a PCN reaction causing severe rash involving mucus membranes or skin necrosis: No Has patient had a PCN reaction that required hospitalization No Has patient had a PCN reaction occurring within the last 10 years: No If all of the above answers are "NO", then may proceed with Cephalosporin use.  . Statins Other (See Comments)    Reaction:  Muscle pain    Social History   Socioeconomic History  .  Marital status: Married    Spouse name: Not on file  . Number of children: Not on file  . Years of education: Not on file  . Highest education level: Not on file  Occupational History  . Not on file  Social Needs  . Financial resource strain: Not on file  . Food insecurity:    Worry: Not on file    Inability: Not on file  . Transportation needs:    Medical: Not on file    Non-medical: Not on file  Tobacco Use  . Smoking status: Former Smoker    Packs/day: 0.50    Years: 30.00    Pack years: 15.00    Types: Cigarettes    Last attempt to quit: 07/24/1989    Years since quitting: 28.7  . Smokeless tobacco: Never Used  Substance and Sexual Activity  . Alcohol use: Yes    Comment: social  . Drug use: No  . Sexual activity: Not on file  Lifestyle  . Physical activity:    Days per week: Not on file    Minutes per session: Not on file  . Stress: Not on file  Relationships  . Social connections:    Talks on phone: Not on file    Gets together: Not on file    Attends religious service: Not on file    Active member of club or organization: Not on file    Attends meetings of clubs or organizations: Not on file    Relationship status: Not on file  . Intimate partner violence:    Fear of current or ex partner: Not on file    Emotionally abused: Not on file    Physically abused: Not on file    Forced sexual activity: Not on file  Other Topics Concern  . 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 Cancer Mother        ovarian  . Cirrhosis Father   . Alcohol abuse Father      Review of Systems  All other systems reviewed and are negative.      Objective:   Physical Exam  Constitutional: He is oriented to person, place, and time. He appears well-developed and well-nourished. No distress.  HENT:  Head: Normocephalic and atraumatic.  Right Ear: External ear normal.  Left  Ear: External ear normal.  Nose: Nose normal.  Mouth/Throat: Oropharynx is clear and moist. No oropharyngeal exudate.  Eyes: Pupils are equal, round, and reactive to light. Conjunctivae and EOM are normal. Right eye exhibits no discharge. Left eye exhibits no discharge. No scleral icterus.  Neck: Normal range of motion. Neck supple. No JVD present. No tracheal deviation present. No thyromegaly present.  Cardiovascular: Normal rate, regular rhythm, normal heart sounds and intact distal pulses. Exam reveals no gallop and no friction rub.  No murmur heard. Pulmonary/Chest: Effort normal and breath sounds normal. No stridor. No respiratory distress. He has no wheezes. He has no rales. He exhibits no tenderness.  Abdominal: Soft. Bowel sounds are normal. He exhibits no distension and no mass. There is no tenderness. There is no rebound and no guarding.  Musculoskeletal: Normal range of motion. He exhibits no edema, tenderness or deformity.  Lymphadenopathy:    He has no cervical adenopathy.  Neurological: He is alert and oriented to person, place, and time. He has normal reflexes. No cranial nerve deficit. He exhibits normal muscle tone. Coordination normal.  Skin: Skin is warm. No rash noted. He is not diaphoretic. No erythema. No pallor.  Psychiatric: He has a normal mood and affect. His behavior is normal. Judgment and thought content normal.  Vitals reviewed.         Assessment & Plan:  Prostate cancer screening - Plan: PSA  Screening cholesterol level - Plan: CBC with Differential/Platelet, COMPLETE METABOLIC PANEL WITH GFR, Lipid panel  CKD (chronic kidney disease) stage 3, GFR 30-59 ml/min ([facility]CC)  History of nephrectomy  History of bladder cancer  Physical exam today is completely normal. Colonoscopy is up-to-date. I will screen for prostate cancer with a PSA given the fact he has a history of prostate cancer as mentioned in the history of present illness.  I recommended Shingrix  and the Tdap but the patient politely declined them at this time.  Hepatitis C screening is up-to-date.  I will check a CBC, CMP, fasting lipid panel, and PSA.  The patient will return fasting for this lab work.  Otherwise the remainder of his exam is normal.  Patient has a history of dyslipidemia with elevated LDL cholesterol and low HDL cholesterol.  He is currently taking Zetia for this.  I will check a fasting lipid panel to monitor the treatment of this as well.

## 2018-04-07 ENCOUNTER — Other Ambulatory Visit: Payer: Medicare Other

## 2018-04-07 DIAGNOSIS — E785 Hyperlipidemia, unspecified: Secondary | ICD-10-CM | POA: Diagnosis not present

## 2018-04-07 DIAGNOSIS — Z125 Encounter for screening for malignant neoplasm of prostate: Secondary | ICD-10-CM | POA: Diagnosis not present

## 2018-04-07 DIAGNOSIS — Z13220 Encounter for screening for lipoid disorders: Secondary | ICD-10-CM | POA: Diagnosis not present

## 2018-04-07 LAB — COMPLETE METABOLIC PANEL WITH GFR
AG Ratio: 1.8 (calc) (ref 1.0–2.5)
ALBUMIN MSPROF: 4.2 g/dL (ref 3.6–5.1)
ALKALINE PHOSPHATASE (APISO): 100 U/L (ref 40–115)
ALT: 16 U/L (ref 9–46)
AST: 20 U/L (ref 10–35)
BILIRUBIN TOTAL: 0.4 mg/dL (ref 0.2–1.2)
BUN / CREAT RATIO: 14 (calc) (ref 6–22)
BUN: 27 mg/dL — ABNORMAL HIGH (ref 7–25)
CO2: 18 mmol/L — AB (ref 20–32)
CREATININE: 1.97 mg/dL — AB (ref 0.70–1.18)
Calcium: 9 mg/dL (ref 8.6–10.3)
Chloride: 105 mmol/L (ref 98–110)
GFR, Est African American: 38 mL/min/{1.73_m2} — ABNORMAL LOW (ref >=60)
GFR, Est Non African American: 33 mL/min/{1.73_m2} — ABNORMAL LOW (ref >=60)
Globulin: 2.4 g/dL (calc) (ref 1.9–3.7)
Glucose, Bld: 103 mg/dL — ABNORMAL HIGH (ref 65–99)
Potassium: 4.5 mmol/L (ref 3.5–5.3)
SODIUM: 137 mmol/L (ref 135–146)
Total Protein: 6.6 g/dL (ref 6.1–8.1)

## 2018-04-07 LAB — CBC WITH DIFFERENTIAL/PLATELET
BASOS ABS: 48 {cells}/uL (ref 0–200)
Basophils Relative: 1.1 %
EOS ABS: 150 {cells}/uL (ref 15–500)
Eosinophils Relative: 3.4 %
HCT: 39.8 % (ref 38.5–50.0)
HEMOGLOBIN: 13.5 g/dL (ref 13.2–17.1)
Lymphs Abs: 1091 cells/uL (ref 850–3900)
MCH: 30.1 pg (ref 27.0–33.0)
MCHC: 33.9 g/dL (ref 32.0–36.0)
MCV: 88.6 fL (ref 80.0–100.0)
MPV: 9.7 fL (ref 7.5–12.5)
Monocytes Relative: 9.5 %
NEUTROS ABS: 2693 {cells}/uL (ref 1500–7800)
NEUTROS PCT: 61.2 %
Platelets: 228 10*3/uL (ref 140–400)
RBC: 4.49 10*6/uL (ref 4.20–5.80)
RDW: 13.1 % (ref 11.0–15.0)
Total Lymphocyte: 24.8 %
WBC: 4.4 10*3/uL (ref 3.8–10.8)
WBCMIX: 418 {cells}/uL (ref 200–950)

## 2018-04-07 LAB — LIPID PANEL
CHOL/HDL RATIO: 4.8 (calc) (ref <5.0)
CHOLESTEROL: 163 mg/dL (ref <200)
HDL: 34 mg/dL — ABNORMAL LOW (ref >40)
LDL Cholesterol (Calc): 99 mg/dL (calc)
Non-HDL Cholesterol (Calc): 129 mg/dL (calc) (ref <130)
Triglycerides: 204 mg/dL — ABNORMAL HIGH (ref <150)

## 2018-04-07 LAB — PSA: PSA: <0.1 ng/mL (ref <=4.0)

## 2018-05-11 ENCOUNTER — Emergency Department (HOSPITAL_COMMUNITY): Payer: Medicare Other

## 2018-05-11 ENCOUNTER — Other Ambulatory Visit: Payer: Self-pay

## 2018-05-11 ENCOUNTER — Encounter (HOSPITAL_COMMUNITY): Payer: Self-pay | Admitting: Emergency Medicine

## 2018-05-11 ENCOUNTER — Emergency Department (HOSPITAL_COMMUNITY)
Admission: EM | Admit: 2018-05-11 | Discharge: 2018-05-12 | Disposition: A | Discharge status: Home / Self Care | Payer: Medicare Other | Attending: Emergency Medicine | Admitting: Emergency Medicine

## 2018-05-11 DIAGNOSIS — Z87891 Personal history of nicotine dependence: Secondary | ICD-10-CM | POA: Insufficient documentation

## 2018-05-11 DIAGNOSIS — Z79899 Other long term (current) drug therapy: Secondary | ICD-10-CM | POA: Diagnosis not present

## 2018-05-11 DIAGNOSIS — M7918 Myalgia, other site: Secondary | ICD-10-CM | POA: Diagnosis not present

## 2018-05-11 DIAGNOSIS — N189 Chronic kidney disease, unspecified: Secondary | ICD-10-CM | POA: Diagnosis not present

## 2018-05-11 DIAGNOSIS — Z7982 Long term (current) use of aspirin: Secondary | ICD-10-CM | POA: Diagnosis not present

## 2018-05-11 DIAGNOSIS — R509 Fever, unspecified: Secondary | ICD-10-CM | POA: Insufficient documentation

## 2018-05-11 LAB — CBC WITH DIFFERENTIAL/PLATELET
Abs Immature Granulocytes: 0.02 10*3/uL (ref 0.00–0.07)
BASOS ABS: 0.1 10*3/uL (ref 0.0–0.1)
Basophils Relative: 1 %
EOS ABS: 0.1 10*3/uL (ref 0.0–0.5)
EOS PCT: 1 %
HCT: 40.7 % (ref 39.0–52.0)
Hemoglobin: 13.2 g/dL (ref 13.0–17.0)
Immature Granulocytes: 0 %
Lymphocytes Relative: 17 %
Lymphs Abs: 1.6 10*3/uL (ref 0.7–4.0)
MCH: 29.5 pg (ref 26.0–34.0)
MCHC: 32.4 g/dL (ref 30.0–36.0)
MCV: 91.1 fL (ref 80.0–100.0)
Monocytes Absolute: 0.8 10*3/uL (ref 0.1–1.0)
Monocytes Relative: 8 %
NRBC: 0 % (ref 0.0–0.2)
Neutro Abs: 7 10*3/uL (ref 1.7–7.7)
Neutrophils Relative %: 73 %
Platelets: 224 10*3/uL (ref 150–400)
RBC: 4.47 MIL/uL (ref 4.22–5.81)
RDW: 13.7 % (ref 11.5–15.5)
WBC: 9.4 10*3/uL (ref 4.0–10.5)

## 2018-05-11 LAB — COMPREHENSIVE METABOLIC PANEL
ALT: 10 U/L (ref 0–44)
ANION GAP: 8 (ref 5–15)
AST: 25 U/L (ref 15–41)
Albumin: 3.6 g/dL (ref 3.5–5.0)
Alkaline Phosphatase: 88 U/L (ref 38–126)
BUN: 24 mg/dL — ABNORMAL HIGH (ref 8–23)
CO2: 24 mmol/L (ref 22–32)
Calcium: 9.1 mg/dL (ref 8.9–10.3)
Chloride: 102 mmol/L (ref 98–111)
Creatinine, Ser: 2.34 mg/dL — ABNORMAL HIGH (ref 0.61–1.24)
GFR, EST AFRICAN AMERICAN: 31 mL/min — AB (ref >60)
GFR, EST NON AFRICAN AMERICAN: 26 mL/min — AB (ref >60)
Glucose, Bld: 124 mg/dL — ABNORMAL HIGH (ref 70–99)
POTASSIUM: 4.8 mmol/L (ref 3.5–5.1)
Sodium: 134 mmol/L — ABNORMAL LOW (ref 135–145)
Total Bilirubin: 1 mg/dL (ref 0.3–1.2)
Total Protein: 6.7 g/dL (ref 6.5–8.1)

## 2018-05-11 LAB — I-STAT CG4 LACTIC ACID, ED: LACTIC ACID, VENOUS: 1.85 mmol/L (ref 0.5–1.9)

## 2018-05-11 LAB — URINALYSIS, ROUTINE W REFLEX MICROSCOPIC
Bilirubin Urine: NEGATIVE
GLUCOSE, UA: NEGATIVE mg/dL
Ketones, ur: NEGATIVE mg/dL
Nitrite: POSITIVE — AB
PH: 6 (ref 5.0–8.0)
Protein, ur: NEGATIVE mg/dL
SPECIFIC GRAVITY, URINE: 1.013 (ref 1.005–1.030)

## 2018-05-11 NOTE — ED Triage Notes (Signed)
Pt reports that he has had a fever since Saturday night. Highest of 103 at home. Pt reports that he took 1000mg  of tylenol at 8pm.

## 2018-05-12 ENCOUNTER — Ambulatory Visit: Payer: Medicare Other | Admitting: Family Medicine

## 2018-05-12 DIAGNOSIS — R509 Fever, unspecified: Secondary | ICD-10-CM | POA: Diagnosis not present

## 2018-05-12 LAB — INFLUENZA PANEL BY PCR (TYPE A & B)
INFLAPCR: NEGATIVE
Influenza B By PCR: NEGATIVE

## 2018-05-12 MED ORDER — ONDANSETRON 4 MG PO TBDP: 4.0000 mg | ORAL_TABLET | Freq: Four times a day (QID) | ORAL | 0 refills | Status: DC | PRN

## 2018-05-12 MED ORDER — CEPHALEXIN 500 MG PO CAPS: 500.0000 mg | ORAL_CAPSULE | Freq: Three times a day (TID) | ORAL | 0 refills | Status: DC

## 2018-05-12 MED ORDER — SODIUM CHLORIDE 0.9 % IV SOLN
1.0000 g | Freq: Once | INTRAVENOUS | Status: AC
  Administered 2018-05-12: 1 g via INTRAVENOUS
  Filled 2018-05-12: qty 10

## 2018-05-12 MED ORDER — ONDANSETRON HCL 4 MG/2ML IJ SOLN
4.0000 mg | Freq: Once | INTRAMUSCULAR | Status: AC
  Administered 2018-05-12: 4 mg via INTRAVENOUS
  Filled 2018-05-12: qty 2

## 2018-05-12 MED ORDER — ACETAMINOPHEN 500 MG PO TABS
1000.0000 mg | ORAL_TABLET | Freq: Once | ORAL | Status: AC
  Administered 2018-05-12: 1000 mg via ORAL
  Filled 2018-05-12: qty 2

## 2018-05-12 NOTE — Discharge Instructions (Signed)
You may take Tylenol 1000 mg every 6 hours as needed for pain and fever.  Your labs today were normal.  Your chest x-ray was clear.  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  Blood cultures and urine culture are pending.  If your blood cultures are positive, you will be contacted to return to the emergency department.  Your flu swab today was negative.

## 2018-05-12 NOTE — ED Provider Notes (Signed)
TIME SEEN: 1:38 AM  CHIEF COMPLAINT: Fever, myalgias  HPI: Patient is a 72 year old male with history of CKD, bladder cancer status post ileostomy, history of bacteremia from E. coli who presents to the emergency department with complaints of fever.  Has been ongoing for 3 days.  Tonight it was 103.4 at home which made him concerned.  Has felt achy all over.  Did have some intermittent right ear pain which is gone.  Has had some nausea but no vomiting.  Reports yesterday he had some diarrhea.  No abdominal pain.  No headache, neck pain or neck stiffness.  No sore throat.  No cough.  No chest pain or shortness of breath.  No rash.  Did have an influenza vaccination this year.  No sick contacts.  ROS: See HPI Constitutional: no fever  Eyes: no drainage  ENT: no runny nose   Cardiovascular:  no chest pain  Resp: no SOB  GI: no vomiting GU: no dysuria Integumentary: no rash  Allergy: no hives  Musculoskeletal: no leg swelling  Neurological: no slurred speech ROS otherwise negative  PAST MEDICAL HISTORY/PAST SURGICAL HISTORY:  Past Medical History:  Diagnosis Date  . Allergy   . Bacteremia due to Gram-negative bacteria 04/13/2015  . Bladder rupture 2010  . Bladder tumor   . Cancer ([place])    bladder  . Depression   . Gout   . 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 Hyperlipidemia   . Restless leg syndrome, controlled     MEDICATIONS:  Prior to Admission medications   Medication Sig Start Date End Date Taking? Authorizing Provider  acetaminophen (TYLENOL) 500 MG tablet Take 1 tablet (500 mg total) by mouth every 6 (six) hours as needed for mild pain, moderate pain or headache. 08/26/16   Arrien, Jimmy Picket, MD  allopurinol (ZYLOPRIM) 300 MG tablet Take 1 tablet (300 mg total) by mouth daily. 03/03/18   [name], MD  aspirin EC 81 MG tablet Take 81 mg by mouth at bedtime.    [provider]  clonazePAM (KLONOPIN) 0.5 MG tablet Take 0.5 mg by mouth 2  (two) times daily.    [provider]  colchicine 0.6 MG tablet Take 0.6-1.2 mg by mouth 2 (two) times daily as needed (for gout flare).     [provider]  escitalopram (LEXAPRO) 20 MG tablet Take 2 tablets (40 mg total) by mouth at bedtime. 02/25/17   [name], MD  ezetimibe (ZETIA) 10 MG tablet TAKE 1 TABLET BY MOUTH  EVERY MORNING 02/27/18   [name], MD  fluticasone [facility]) 50 MCG/ACT nasal spray USE 2 SPRAYS IN EACH  NOSTRIL DAILY 02/27/18   [name], MD  omeprazole (PRILOSEC) 20 MG capsule Take 1 capsule (20 mg total) by mouth 2 (two) times daily before a meal. Patient taking differently: Take 20 mg by mouth daily.  02/25/17   [name], MD  rOPINIRole (REQUIP) 3 MG tablet Take 1 tablet (3 mg total) by mouth at bedtime. Patient taking differently: Take 4 mg by mouth at bedtime.  02/25/17   [name], MD  traZODone (DESYREL) 100 MG tablet Take 50 mg by mouth daily as needed for sleep.     [provider]    ALLERGIES:  Allergies  Allergen Reactions  . Augmentin [Amoxicillin-Pot Clavulanate] Nausea And Vomiting and Other (See Comments)    Has patient had a PCN reaction causing immediate rash, facial/tongue/throat swelling, SOB or  lightheadedness with hypotension: No Has patient had a PCN reaction causing severe rash involving mucus membranes or skin necrosis: No Has patient had a PCN reaction that required hospitalization No Has patient had a PCN reaction occurring within the last 10 years: No If all of the above answers are "NO", then may proceed with Cephalosporin use.  . Statins Other (See Comments)    Reaction:  Muscle pain     SOCIAL HISTORY:  Social History   Tobacco Use  . Smoking status: Former Smoker    Packs/day: 0.50    Years: 30.00    Pack years: 15.00    Types: Cigarettes    Last attempt to quit: 07/24/1989    Years since quitting: 28.8  . Smokeless tobacco: Never Used  Substance Use Topics  .  Alcohol use: Yes    Comment: social    FAMILY HISTORY: Family History  Problem Relation Age of Onset  . Cancer Mother        ovarian  . Cirrhosis Father   . Alcohol abuse Father     EXAM: BP 132/73 (BP Location: Right Arm)   Pulse 69   Temp 100.1 F (37.8 C) (Oral)   Resp 20   Ht 5\' 9"  (1.753 m)   Wt 93 kg   SpO2 98%   BMI 30.27 kg/m  CONSTITUTIONAL: Alert and oriented and responds appropriately to questions. Well-appearing; well-nourished HEAD: Normocephalic EYES: Conjunctivae clear, pupils appear equal, EOMI ENT: normal nose; moist mucous membranes; No pharyngeal erythema or petechiae, no tonsillar hypertrophy or exudate, no uvular deviation, no unilateral swelling, no trismus or drooling, no muffled voice, normal phonation, no stridor, no dental caries present, no drainable dental abscess noted, no Ludwig's angina, tongue sits flat in the bottom of the mouth, no angioedema, no facial erythema or warmth, no facial swelling; no pain with movement of the neck.  TMs are clear bilaterally without erythema, purulence, bulging, perforation, effusion.  No cerumen impaction or sign of foreign body in the external auditory canal. No inflammation, erythema or drainage from the external auditory canal. No signs of mastoiditis. No pain with manipulation of the pinna bilaterally. NECK: Supple, no meningismus, no nuchal rigidity, no LAD  CARD: RRR; S1 and S2 appreciated; no murmurs, no clicks, no rubs, no gallops RESP: Normal chest excursion without splinting or tachypnea; breath sounds clear and equal bilaterally; no wheezes, no rhonchi, no rales, no hypoxia or respiratory distress, speaking full sentences ABD/GI: Normal bowel sounds; non-distended; soft, non-tender, no rebound, no guarding, no peritoneal signs, no hepatosplenomegaly, ileostomy in the left abdomen BACK:  The back appears normal and is non-tender to palpation, there is no CVA tenderness EXT: Normal ROM in all joints; non-tender  to palpation; no edema; normal capillary refill; no cyanosis, no calf tenderness or swelling    SKIN: Normal color for age and race; warm; no rash NEURO: Moves all extremities equally PSYCH: The patient's mood and manner are appropriate. Grooming and personal hygiene are appropriate.  MEDICAL DECISION MAKING: Patient here with fever.  Labs here are reassuring including a normal white blood cell count and normal lactate.  He is hemodynamically stable.  He took Tylenol prior to arrival and his temperature is now 100.1.  Chest x-ray is clear.  Urine does appear infected but patient does have an ileostomy.  We will send urine culture.  He frequently grows E. coli that is pansensitive.  He has also grown Klebsiella previously that was sensitive to cephalosporins.  He has had bacteremia  from E. coli before.  We will send blood cultures and obtain flu swab.  Will give IV ceftriaxone here in the emergency department.  Will give Zofran for his complaints of nausea.  ED PROGRESS: Patient reports feeling better.  Flu swab negative.  Blood cultures and urine culture pending.  I feel he is safe to be discharged on Keflex and with prescription of Zofran.  He is also comfortable with this plan.  He has a PCP for follow-up as needed.  Discussed return precautions and supportive care instructions.   At this time, I do not feel there is any life-threatening condition present. I have reviewed and discussed all results (EKG, imaging, lab, urine as appropriate) and exam findings with patient/family. I have reviewed nursing notes and appropriate previous records.  I feel the patient is safe to be discharged home without further emergent workup and can continue workup as an outpatient as needed. Discussed usual and customary return precautions. Patient/family verbalize understanding and are comfortable with this plan.  Outpatient follow-up has been provided if needed. All questions have been answered.      Nickoles Gregori, Delice Bison,  DO 05/12/18 [phone]

## 2018-05-14 LAB — URINE CULTURE: Culture: >=100000 — AB

## 2018-05-15 ENCOUNTER — Telehealth: Payer: Self-pay | Admitting: *Deleted

## 2018-05-15 NOTE — Telephone Encounter (Signed)
Post ED Visit - Positive Culture Follow-up  Culture report reviewed by antimicrobial stewardship pharmacist:  []  Elenor Quinones, Pharm.D. []  Heide Guile, Pharm.D., BCPS AQ-ID []  Parks Neptune, Pharm.D., BCPS []  Alycia Rossetti, Pharm.D., BCPS []  Rodney, Pharm.D., BCPS, AAHIVP []  Legrand Como, Pharm.D., BCPS, AAHIVP []  Salome Arnt, PharmD, BCPS []  Johnnette Gourd, PharmD, BCPS []  Hughes Better, PharmD, BCPS []  Leeroy Cha, PharmD J. Tessin, PharmD  Positive urine culture Treated with Cephalexin, organism sensitive to the same and no further patient follow-up is required at this time.  Harlon Flor [facility] 05/15/2018, 9:03 AM

## 2018-05-17 LAB — CULTURE, BLOOD (ROUTINE X 2)
Culture: NO GROWTH
Culture: NO GROWTH
Special Requests: ADEQUATE

## 2018-06-11 DIAGNOSIS — Z634 Disappearance and death of family member: Secondary | ICD-10-CM | POA: Diagnosis not present

## 2018-06-11 DIAGNOSIS — F331 Major depressive disorder, recurrent, moderate: Secondary | ICD-10-CM | POA: Diagnosis not present

## 2018-06-11 DIAGNOSIS — F411 Generalized anxiety disorder: Secondary | ICD-10-CM | POA: Diagnosis not present

## 2018-06-15 DIAGNOSIS — Z8546 Personal history of malignant neoplasm of prostate: Secondary | ICD-10-CM | POA: Diagnosis not present

## 2018-06-17 ENCOUNTER — Other Ambulatory Visit: Payer: Self-pay | Admitting: *Deleted

## 2018-06-17 MED ORDER — OMEPRAZOLE 20 MG PO CPDR: 20.0000 mg | DELAYED_RELEASE_CAPSULE | Freq: Every day | ORAL | 3 refills | Status: DC

## 2018-06-22 DIAGNOSIS — Z8551 Personal history of malignant neoplasm of bladder: Secondary | ICD-10-CM | POA: Diagnosis not present

## 2018-06-22 DIAGNOSIS — N486 Induration penis plastica: Secondary | ICD-10-CM | POA: Diagnosis not present

## 2018-06-22 DIAGNOSIS — Z8546 Personal history of malignant neoplasm of prostate: Secondary | ICD-10-CM | POA: Diagnosis not present

## 2018-06-24 ENCOUNTER — Other Ambulatory Visit: Payer: Self-pay | Admitting: Family Medicine

## 2018-07-27 DIAGNOSIS — N486 Induration penis plastica: Secondary | ICD-10-CM | POA: Diagnosis not present

## 2018-07-27 DIAGNOSIS — Z8551 Personal history of malignant neoplasm of bladder: Secondary | ICD-10-CM | POA: Diagnosis not present

## 2018-08-25 ENCOUNTER — Inpatient Hospital Stay: Payer: Medicare Other | Admitting: Oncology

## 2018-09-04 ENCOUNTER — Ambulatory Visit (INDEPENDENT_AMBULATORY_CARE_PROVIDER_SITE_OTHER): Payer: Medicare Other | Admitting: Family Medicine

## 2018-09-04 ENCOUNTER — Encounter: Payer: Self-pay | Admitting: Family Medicine

## 2018-09-04 VITALS — BP 118/70 | HR 60 | Temp 97.6°F | Resp 14 | Ht 69.0 in | Wt 214.0 lb

## 2018-09-04 DIAGNOSIS — G609 Hereditary and idiopathic neuropathy, unspecified: Secondary | ICD-10-CM

## 2018-09-04 DIAGNOSIS — R7309 Other abnormal glucose: Secondary | ICD-10-CM | POA: Diagnosis not present

## 2018-09-04 MED ORDER — GABAPENTIN 300 MG PO CAPS: 300.0000 mg | ORAL_CAPSULE | Freq: Three times a day (TID) | ORAL | 3 refills | Status: DC | PRN

## 2018-09-04 NOTE — Progress Notes (Signed)
Subjective:    Patient ID: [name], male    DOB: 1946-02-04, 73 y.o.   MRN: [id number]  HPI Patient presents today with a 2 or 31-month history of pins-and-needles burning pain on the plantar aspects of both feet.  It is symmetric on both sides.  It tends to be worse over his third fourth and fifth toes bilaterally.  He denies any numbness but he does report pins-and-needles and burning pain.  Is primarily worse at night and does interfere with his sleep.  Patient has normal sensation to 10 g monofilament bilaterally.  However he does have diminished sensation to vibration with a tuning fork as well as cold sensation in the left foot.  He has normal pulses checked at the dorsalis pedis and posterior tibialis bilaterally.  He has normal reflexes.  He denies any muscle weakness in his legs.  He denies any falls or clumsiness.  He denies any back pain or lumbar radiculopathy.  He does have a history of borderline diabetes mellitus in the past Immunization History  Administered Date(s) Administered  . Influenza Split 05/25/2013  . Influenza,inj,Quad PF,6+ Mos 04/22/2014, 04/28/2015, 05/09/2016, 03/07/2017, 03/03/2018  . Pneumococcal Conjugate-13 04/22/2014  . Pneumococcal Polysaccharide-23 04/28/2015  . Zoster 01/27/2009   Past Medical History:  Diagnosis Date  . Allergy   . Bacteremia due to Gram-negative bacteria 04/13/2015  . Bladder rupture 2010  . Bladder tumor   . Cancer ([place])    bladder  . Depression   . Gout   . 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 Hyperlipidemia   . Restless leg syndrome, controlled    Past Surgical History:  Procedure Laterality Date  . APPENDECTOMY    . BLADDER SURGERY  2010  . CYSTOSCOPY W/ RETROGRADES Bilateral 04/21/2013   Procedure: CYSTOSCOPY WITH RETROGRADE PYELOGRAM;  Surgeon: Molli Hazard, MD;  Location: [facility];  Service: Urology;  Laterality: Bilateral;  . FRACTURE SURGERY Right 01/05/2005   lower leg/ankle  . NASAL  SEPTUM SURGERY    . TONSILLECTOMY AND ADENOIDECTOMY    . TRANSURETHRAL RESECTION OF BLADDER TUMOR WITH GYRUS (TURBT-GYRUS) N/A 04/21/2013   Procedure: TRANSURETHRAL RESECTION OF BLADDER TUMOR WITH GYRUS (TURBT-GYRUS), cystoscopy;  Surgeon: Molli Hazard, MD;  Location: [facility];  Service: Urology;  Laterality: N/A;   Current Outpatient Medications on File Prior to Visit  Medication Sig Dispense Refill  . acetaminophen (TYLENOL) 500 MG tablet Take 1 tablet (500 mg total) by mouth every 6 (six) hours as needed for mild pain, moderate pain or headache. 30 tablet 0  . allopurinol (ZYLOPRIM) 300 MG tablet Take 1 tablet (300 mg total) by mouth daily. 90 tablet 3  . aspirin EC 81 MG tablet Take 81 mg by mouth at bedtime.    . cephALEXin (KEFLEX) 500 MG capsule Take 1 capsule (500 mg total) by mouth 3 (three) times daily. 21 capsule 0  . clonazePAM (KLONOPIN) 0.5 MG tablet Take 0.5 mg by mouth 2 (two) times daily.  0  . colchicine 0.6 MG tablet Take 0.6-1.2 mg by mouth 2 (two) times daily as needed (for gout flare).     [name] escitalopram (LEXAPRO) 20 MG tablet Take 2 tablets (40 mg total) by mouth at bedtime. 180 tablet 3  . ezetimibe (ZETIA) 10 MG tablet TAKE 1 TABLET BY MOUTH  EVERY MORNING 90 tablet 1  . fluticasone (FLONASE) 50 MCG/ACT nasal spray USE 2 SPRAYS IN EACH  NOSTRIL DAILY (Patient taking differently: Place 2 sprays  into both nostrils daily. ) 48 g 3  . omeprazole (PRILOSEC) 20 MG capsule Take 1 capsule (20 mg total) by mouth daily. 90 capsule 3  . ondansetron (ZOFRAN ODT) 4 MG disintegrating tablet Take 1 tablet (4 mg total) by mouth every 6 (six) hours as needed. 20 tablet 0  . rOPINIRole (REQUIP) 3 MG tablet Take 1 tablet (3 mg total) by mouth at bedtime. (Patient not taking: Reported on 05/12/2018) 90 tablet 3  . rOPINIRole (REQUIP) 4 MG tablet Take 4 mg by mouth at bedtime.     . traZODone (DESYREL) 100 MG tablet Take 50 mg by mouth daily as needed for sleep.      No current  facility-administered medications on file prior to visit.    Allergies  Allergen Reactions  . Augmentin [Amoxicillin-Pot Clavulanate] Nausea And Vomiting and Other (See Comments)    Has patient had a PCN reaction causing immediate rash, facial/tongue/throat swelling, SOB or lightheadedness with hypotension: No Has patient had a PCN reaction causing severe rash involving mucus membranes or skin necrosis: No Has patient had a PCN reaction that required hospitalization No Has patient had a PCN reaction occurring within the last 10 years: No If all of the above answers are "NO", then may proceed with Cephalosporin use.  . Statins Other (See Comments)    Reaction:  Muscle pain    Social History   Socioeconomic History  . Marital status: Married    Spouse name: Not on file  . Number of children: Not on file  . Years of education: Not on file  . Highest education level: Not on file  Occupational History  . Not on file  Social Needs  . Financial resource strain: Not on file  . Food insecurity:    Worry: Not on file    Inability: Not on file  . Transportation needs:    Medical: Not on file    Non-medical: Not on file  Tobacco Use  . Smoking status: Former Smoker    Packs/day: 0.50    Years: 30.00    Pack years: 15.00    Types: Cigarettes    Last attempt to quit: 07/24/1989    Years since quitting: 29.1  . Smokeless tobacco: Never Used  Substance and Sexual Activity  . Alcohol use: Yes    Comment: social  . Drug use: No  . Sexual activity: Not on file  Lifestyle  . Physical activity:    Days per week: Not on file    Minutes per session: Not on file  . Stress: Not on file  Relationships  . Social connections:    Talks on phone: Not on file    Gets together: Not on file    Attends religious service: Not on file    Active member of club or organization: Not on file    Attends meetings of clubs or organizations: Not on file    Relationship status: Not on file  . Intimate  partner violence:    Fear of current or ex partner: Not on file    Emotionally abused: Not on file    Physically abused: Not on file    Forced sexual activity: Not on file  Other Topics Concern  . 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 Cancer Mother        ovarian  . Cirrhosis  Father   . Alcohol abuse Father      Review of Systems  All other systems reviewed and are negative.      Objective:   Physical Exam  Constitutional: He is oriented to person, place, and time. He appears well-developed and well-nourished. No distress.  HENT:  Head: Normocephalic and atraumatic.  Right Ear: External ear normal.  Left Ear: External ear normal.  Nose: Nose normal.  Mouth/Throat: Oropharynx is clear and moist. No oropharyngeal exudate.  Eyes: Pupils are equal, round, and reactive to light. Conjunctivae and EOM are normal. Right eye exhibits no discharge. Left eye exhibits no discharge. No scleral icterus.  Neck: Normal range of motion. Neck supple. No JVD present. No tracheal deviation present. No thyromegaly present.  Cardiovascular: Normal rate, regular rhythm, normal heart sounds and intact distal pulses. Exam reveals no gallop and no friction rub.  No murmur heard. Pulmonary/Chest: Effort normal and breath sounds normal. No stridor. No respiratory distress. He has no wheezes. He has no rales. He exhibits no tenderness.  Abdominal: Soft. Bowel sounds are normal. He exhibits no distension and no mass. There is no abdominal tenderness. There is no rebound and no guarding.  Musculoskeletal: Normal range of motion.        General: No tenderness, deformity or edema.  Lymphadenopathy:    He has no cervical adenopathy.  Neurological: He is alert and oriented to person, place, and time. He has normal reflexes. No cranial nerve deficit. He exhibits normal muscle tone. Coordination normal.  Skin:  Skin is warm. No rash noted. He is not diaphoretic. No erythema. No pallor.  Psychiatric: He has a normal mood and affect. His behavior is normal. Judgment and thought content normal.  Vitals reviewed.         Assessment & Plan:  Idiopathic peripheral neuropathy - Plan: gabapentin (NEURONTIN) 300 MG capsule, Hemoglobin A1c, CBC with Differential/Platelet, COMPLETE METABOLIC PANEL WITH GFR, Vitamin B12, TSH  Physical exam today is consistent with idiopathic peripheral neuropathy.  I will check a hemoglobin A1c to determine if his diabetes has worsened.  Also check a CBC and a CMP to check for any electrolyte abnormalities.  Screen the patient for vitamin B12 deficiency and also check a TSH to evaluate for thyroid abnormalities.  If normal we will start gabapentin 300 mg p.o. nightly for symptomatic relief.

## 2018-09-05 LAB — COMPLETE METABOLIC PANEL WITH GFR
AG Ratio: 1.4 (calc) (ref 1.0–2.5)
ALT: 16 U/L (ref 9–46)
AST: 21 U/L (ref 10–35)
Albumin: 4.2 g/dL (ref 3.6–5.1)
Alkaline phosphatase (APISO): 104 U/L (ref 35–144)
BUN/Creatinine Ratio: 17 (calc) (ref 6–22)
BUN: 31 mg/dL — ABNORMAL HIGH (ref 7–25)
CO2: 22 mmol/L (ref 20–32)
CREATININE: 1.78 mg/dL — AB (ref 0.70–1.18)
Calcium: 9.1 mg/dL (ref 8.6–10.3)
Chloride: 107 mmol/L (ref 98–110)
GFR, Est African American: 44 mL/min/{1.73_m2} — ABNORMAL LOW (ref >=60)
GFR, Est Non African American: 38 mL/min/{1.73_m2} — ABNORMAL LOW (ref >=60)
GLOBULIN: 2.9 g/dL (ref 1.9–3.7)
Glucose, Bld: 106 mg/dL — ABNORMAL HIGH (ref 65–99)
Potassium: 5 mmol/L (ref 3.5–5.3)
SODIUM: 138 mmol/L (ref 135–146)
Total Bilirubin: 0.3 mg/dL (ref 0.2–1.2)
Total Protein: 7.1 g/dL (ref 6.1–8.1)

## 2018-09-05 LAB — CBC WITH DIFFERENTIAL/PLATELET
ABSOLUTE MONOCYTES: 388 {cells}/uL (ref 200–950)
BASOS ABS: 41 {cells}/uL (ref 0–200)
Basophils Relative: 0.8 %
EOS ABS: 102 {cells}/uL (ref 15–500)
Eosinophils Relative: 2 %
HCT: 41.3 % (ref 38.5–50.0)
Hemoglobin: 14.1 g/dL (ref 13.2–17.1)
LYMPHS ABS: 1010 {cells}/uL (ref 850–3900)
MCH: 31 pg (ref 27.0–33.0)
MCHC: 34.1 g/dL (ref 32.0–36.0)
MCV: 90.8 fL (ref 80.0–100.0)
MONOS PCT: 7.6 %
MPV: 9.9 fL (ref 7.5–12.5)
NEUTROS PCT: 69.8 %
Neutro Abs: 3560 cells/uL (ref 1500–7800)
PLATELETS: 224 10*3/uL (ref 140–400)
RBC: 4.55 10*6/uL (ref 4.20–5.80)
RDW: 13.4 % (ref 11.0–15.0)
TOTAL LYMPHOCYTE: 19.8 %
WBC: 5.1 10*3/uL (ref 3.8–10.8)

## 2018-09-05 LAB — HEMOGLOBIN A1C
HEMOGLOBIN A1C: 5.9 %{Hb} — AB (ref <5.7)
MEAN PLASMA GLUCOSE: 123 (calc)
eAG (mmol/L): 6.8 (calc)

## 2018-09-05 LAB — VITAMIN B12: VITAMIN B 12: 321 pg/mL (ref 200–1100)

## 2018-09-05 LAB — TSH: TSH: 2.56 mIU/L (ref 0.40–4.50)

## 2018-09-14 ENCOUNTER — Telehealth: Payer: Self-pay | Admitting: Family Medicine

## 2018-09-14 DIAGNOSIS — G609 Hereditary and idiopathic neuropathy, unspecified: Secondary | ICD-10-CM

## 2018-09-14 MED ORDER — GABAPENTIN 300 MG PO CAPS: 300.0000 mg | ORAL_CAPSULE | Freq: Three times a day (TID) | ORAL | 3 refills | Status: DC | PRN

## 2018-09-14 NOTE — Telephone Encounter (Signed)
Pt needs [place] to call in gabapentin to [address]

## 2018-09-14 NOTE — Telephone Encounter (Signed)
Medication called/sent to requested pharmacy  

## 2018-10-18 ENCOUNTER — Encounter: Payer: Self-pay | Admitting: Family Medicine

## 2018-10-18 DIAGNOSIS — G609 Hereditary and idiopathic neuropathy, unspecified: Secondary | ICD-10-CM

## 2018-10-20 ENCOUNTER — Other Ambulatory Visit: Payer: Self-pay | Admitting: Family Medicine

## 2018-10-20 MED ORDER — GABAPENTIN 300 MG PO CAPS: 300.0000 mg | ORAL_CAPSULE | Freq: Three times a day (TID) | ORAL | 3 refills | Status: DC | PRN

## 2018-10-20 MED ORDER — ROSUVASTATIN CALCIUM 5 MG PO TABS: 5.0000 mg | ORAL_TABLET | Freq: Every day | ORAL | 3 refills | Status: DC

## 2018-11-10 ENCOUNTER — Other Ambulatory Visit: Payer: Self-pay | Admitting: Family Medicine

## 2018-11-11 DIAGNOSIS — Z634 Disappearance and death of family member: Secondary | ICD-10-CM | POA: Diagnosis not present

## 2018-11-11 DIAGNOSIS — F331 Major depressive disorder, recurrent, moderate: Secondary | ICD-10-CM | POA: Diagnosis not present

## 2018-11-11 DIAGNOSIS — F411 Generalized anxiety disorder: Secondary | ICD-10-CM | POA: Diagnosis not present

## 2018-12-03 ENCOUNTER — Other Ambulatory Visit: Payer: Self-pay | Admitting: Family Medicine

## 2018-12-03 MED ORDER — ALLOPURINOL 300 MG PO TABS: 300.0000 mg | ORAL_TABLET | Freq: Every day | ORAL | 3 refills | Status: DC

## 2018-12-07 DIAGNOSIS — D1801 Hemangioma of skin and subcutaneous tissue: Secondary | ICD-10-CM | POA: Diagnosis not present

## 2018-12-07 DIAGNOSIS — D2272 Melanocytic nevi of left lower limb, including hip: Secondary | ICD-10-CM | POA: Diagnosis not present

## 2018-12-07 DIAGNOSIS — D2261 Melanocytic nevi of right upper limb, including shoulder: Secondary | ICD-10-CM | POA: Diagnosis not present

## 2018-12-07 DIAGNOSIS — D225 Melanocytic nevi of trunk: Secondary | ICD-10-CM | POA: Diagnosis not present

## 2018-12-07 DIAGNOSIS — L821 Other seborrheic keratosis: Secondary | ICD-10-CM | POA: Diagnosis not present

## 2018-12-07 DIAGNOSIS — D692 Other nonthrombocytopenic purpura: Secondary | ICD-10-CM | POA: Diagnosis not present

## 2018-12-07 DIAGNOSIS — D2262 Melanocytic nevi of left upper limb, including shoulder: Secondary | ICD-10-CM | POA: Diagnosis not present

## 2018-12-07 DIAGNOSIS — D2271 Melanocytic nevi of right lower limb, including hip: Secondary | ICD-10-CM | POA: Diagnosis not present

## 2018-12-17 DIAGNOSIS — Z8551 Personal history of malignant neoplasm of bladder: Secondary | ICD-10-CM | POA: Diagnosis not present

## 2018-12-23 DIAGNOSIS — N130 Hydronephrosis with ureteropelvic junction obstruction: Secondary | ICD-10-CM | POA: Diagnosis not present

## 2018-12-23 DIAGNOSIS — Z436 Encounter for attention to other artificial openings of urinary tract: Secondary | ICD-10-CM | POA: Diagnosis not present

## 2018-12-23 DIAGNOSIS — N200 Calculus of kidney: Secondary | ICD-10-CM | POA: Diagnosis not present

## 2018-12-23 DIAGNOSIS — N183 Chronic kidney disease, stage 3 (moderate): Secondary | ICD-10-CM | POA: Diagnosis not present

## 2019-03-04 IMAGING — DX DG CHEST 2V
2 series · 2 of 2 positions shown · non-contrast
Comparison: 08/24/2016

CLINICAL DATA: Bladder cancer

EXAM:
CHEST - 2 VIEW

[chest pa]
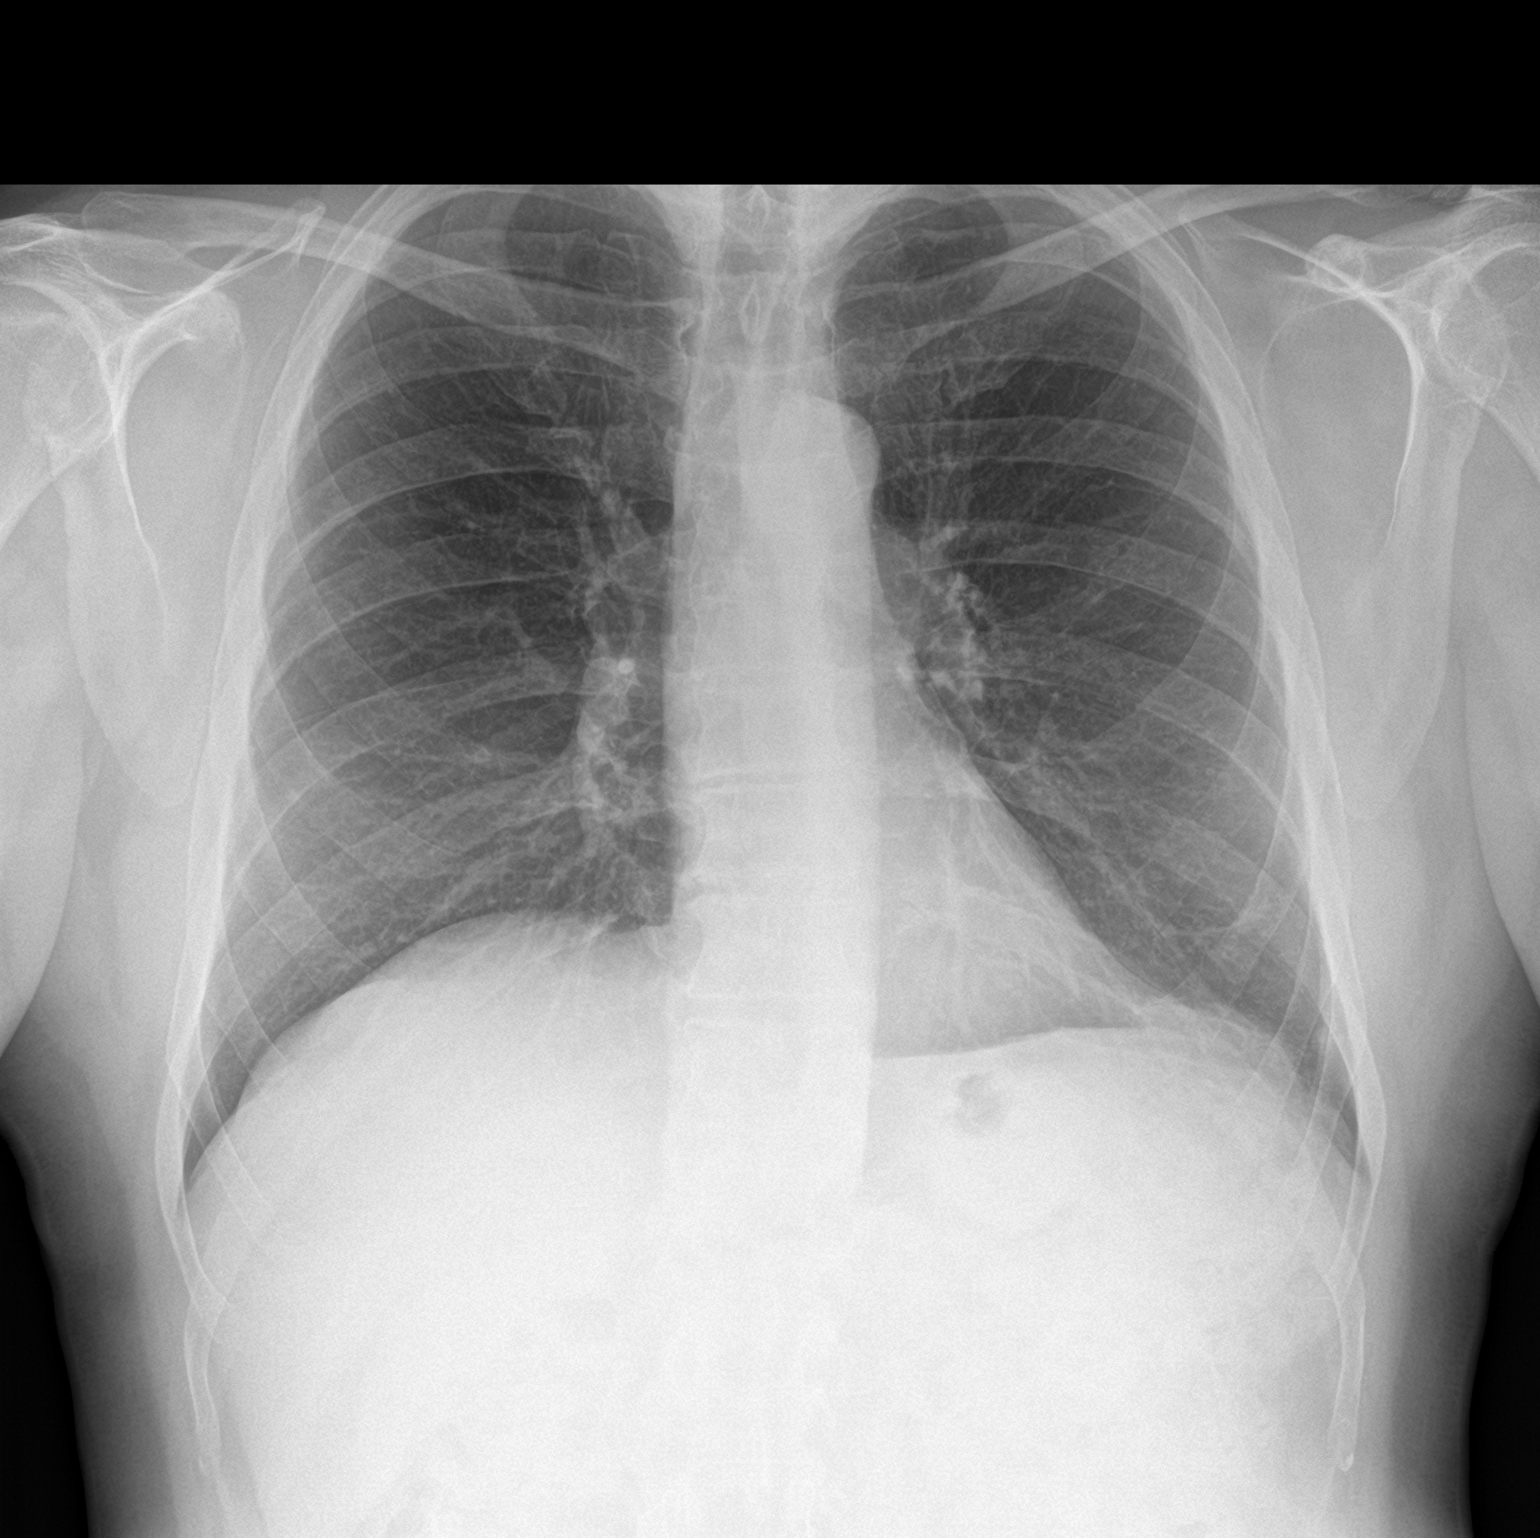

[chest lat]
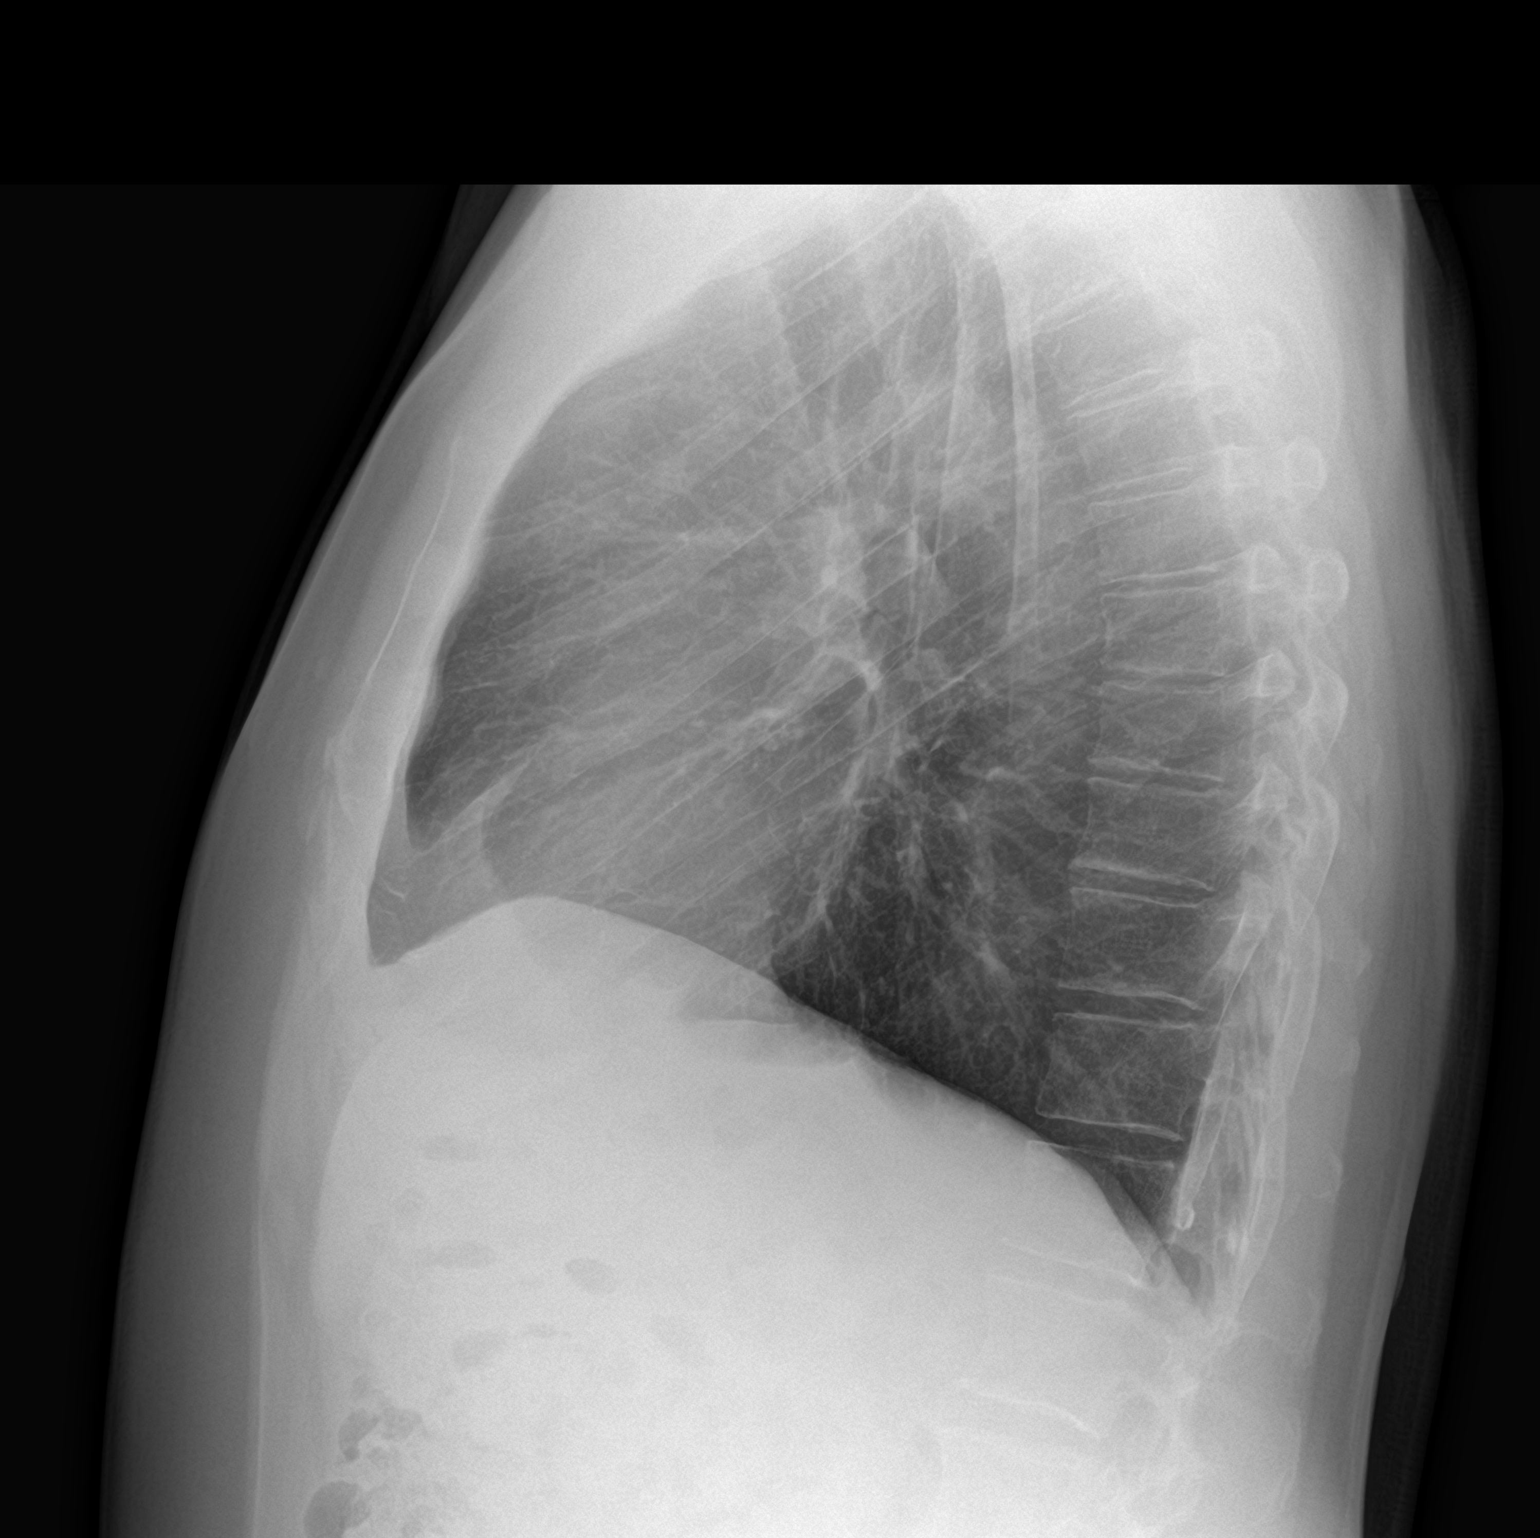

[2 of 2 positions shown; findings below may reference images not displayed]

FINDINGS: Small nodule at the left lung base is stable, likely scarring. Right
lung clear. Heart is normal size. No effusions or acute bony
abnormality.
IMPRESSION: No active cardiopulmonary disease.

## 2019-03-08 ENCOUNTER — Other Ambulatory Visit: Payer: Self-pay | Admitting: Family Medicine

## 2019-03-08 MED ORDER — ALLOPURINOL 300 MG PO TABS: 300.0000 mg | ORAL_TABLET | Freq: Every day | ORAL | 3 refills | Status: DC

## 2019-03-26 ENCOUNTER — Other Ambulatory Visit: Payer: Self-pay | Admitting: Family Medicine

## 2019-03-26 MED ORDER — EZETIMIBE 10 MG PO TABS: 10.0000 mg | ORAL_TABLET | Freq: Every morning | ORAL | 3 refills | Status: DC

## 2019-03-30 ENCOUNTER — Other Ambulatory Visit: Payer: Self-pay | Admitting: Family Medicine

## 2019-04-26 ENCOUNTER — Emergency Department (HOSPITAL_COMMUNITY)
Admission: EM | Admit: 2019-04-26 | Discharge: 2019-04-26 | Disposition: A | Discharge status: Home / Self Care | Payer: Medicare Other | Attending: Emergency Medicine | Admitting: Emergency Medicine

## 2019-04-26 ENCOUNTER — Other Ambulatory Visit: Payer: Self-pay

## 2019-04-26 ENCOUNTER — Encounter (HOSPITAL_COMMUNITY): Payer: Self-pay | Admitting: Emergency Medicine

## 2019-04-26 DIAGNOSIS — N183 Chronic kidney disease, stage 3 unspecified: Secondary | ICD-10-CM | POA: Diagnosis not present

## 2019-04-26 DIAGNOSIS — Y929 Unspecified place or not applicable: Secondary | ICD-10-CM | POA: Diagnosis not present

## 2019-04-26 DIAGNOSIS — Z79899 Other long term (current) drug therapy: Secondary | ICD-10-CM | POA: Insufficient documentation

## 2019-04-26 DIAGNOSIS — Z905 Acquired absence of kidney: Secondary | ICD-10-CM | POA: Insufficient documentation

## 2019-04-26 DIAGNOSIS — S0081XA Abrasion of other part of head, initial encounter: Secondary | ICD-10-CM | POA: Diagnosis not present

## 2019-04-26 DIAGNOSIS — Y999 Unspecified external cause status: Secondary | ICD-10-CM | POA: Insufficient documentation

## 2019-04-26 DIAGNOSIS — Z87891 Personal history of nicotine dependence: Secondary | ICD-10-CM | POA: Diagnosis not present

## 2019-04-26 DIAGNOSIS — Z23 Encounter for immunization: Secondary | ICD-10-CM | POA: Insufficient documentation

## 2019-04-26 DIAGNOSIS — Y93A1 Activity, exercise machines primarily for cardiorespiratory conditioning: Secondary | ICD-10-CM | POA: Insufficient documentation

## 2019-04-26 DIAGNOSIS — W19XXXA Unspecified fall, initial encounter: Secondary | ICD-10-CM | POA: Insufficient documentation

## 2019-04-26 MED ORDER — TETANUS-DIPHTH-ACELL PERTUSSIS 5-2.5-18.5 LF-MCG/0.5 IM SUSP
0.5000 mL | Freq: Once | INTRAMUSCULAR | Status: AC
  Administered 2019-04-26: 16:00:00 0.5 mL via INTRAMUSCULAR
  Filled 2019-04-26: qty 0.5

## 2019-04-26 NOTE — Discharge Instructions (Signed)
You may apply Neosporin, bacitracin to your wound.  Please keep this clean and dry.  You may also keep this wound away from the sun along with wear a hat to prevent any scarring.  She experience any vomiting, dizziness, headaches please return to the emergency department as we may need to obtain a CT head.

## 2019-04-26 NOTE — ED Provider Notes (Signed)
[facility] DEPT Provider Note   CSN: [id number] Arrival date & time: 04/26/19  1427     History   Chief Complaint Chief Complaint  Patient presents with  . Fall  . Head Injury    HPI [name] is a 73 y.o. male.     73 y.o male with no PMH presents to the ED s/p mechanical fall. Patient reports he was walking on the treadmill when he tried to speed off to run and stepped on the cover to the treadmill causing him to fall forward.Patient does have an abrasion to the middle of his forehead. He was able to drive to the emergency department. He reports hitting his face but did not lose consciousness. Patient reports being able to stand up after this incident. He denies any headache, blood thinners, dizziness, weakness.   The history is provided by the patient.  Fall Pertinent negatives include no chest pain, no abdominal pain, no headaches and no shortness of breath.  Head Injury Associated symptoms: no headaches, no nausea and no seizures     Past Medical History:  Diagnosis Date  . Allergy   . Bacteremia due to Gram-negative bacteria 04/13/2015  . Bladder rupture 2010  . Bladder tumor   . Cancer ([place])    bladder  . Depression   . Gout   . 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 Hyperlipidemia   . Restless leg syndrome, controlled     Patient Active Problem List   Diagnosis Date Noted  . UTI (urinary tract infection) 08/24/2016  . Fever 08/24/2016  . History of nephrectomy 08/24/2016  . CKD (chronic kidney disease) stage 3, GFR 30-59 ml/min 08/24/2016  . Flu-like symptoms 08/24/2016  . Sepsis due to Escherichia coli (E. coli) ([place]) 04/14/2015  . Bacteremia due to Gram-negative bacteria ([place]), E. Coli 04/14/2015  . UTI (lower urinary tract infection) 04/13/2015  . Acute renal failure superimposed on stage 3 chronic kidney disease ([place]) 04/13/2015  . Anemia of chronic kidney failure 04/13/2015  . Acute hyponatremia  04/13/2015  . Bladder cancer ([place]) 06/23/2013  . Hyperlipidemia   . GERD (gastroesophageal reflux disease) 12/22/2012    Past Surgical History:  Procedure Laterality Date  . APPENDECTOMY    . BLADDER SURGERY  2010  . CYSTOSCOPY W/ RETROGRADES Bilateral 04/21/2013   Procedure: CYSTOSCOPY WITH RETROGRADE PYELOGRAM;  Surgeon: Molli Hazard, MD;  Location: [facility];  Service: Urology;  Laterality: Bilateral;  . FRACTURE SURGERY Right 01/05/2005   lower leg/ankle  . NASAL SEPTUM SURGERY    . TONSILLECTOMY AND ADENOIDECTOMY    . TRANSURETHRAL RESECTION OF BLADDER TUMOR WITH GYRUS (TURBT-GYRUS) N/A 04/21/2013   Procedure: TRANSURETHRAL RESECTION OF BLADDER TUMOR WITH GYRUS (TURBT-GYRUS), cystoscopy;  Surgeon: Molli Hazard, MD;  Location: [facility];  Service: Urology;  Laterality: N/A;        Home Medications    Prior to Admission medications   Medication Sig Start Date End Date Taking? Authorizing Provider  acetaminophen (TYLENOL) 500 MG tablet Take 1 tablet (500 mg total) by mouth every 6 (six) hours as needed for mild pain, moderate pain or headache. 08/26/16   Arrien, Jimmy Picket, MD  allopurinol (ZYLOPRIM) 300 MG tablet Take 1 tablet (300 mg total) by mouth daily. 03/08/19   [name], MD  aspirin EC 81 MG tablet Take 81 mg by mouth at bedtime.    [provider]  clonazePAM (KLONOPIN) 0.5 MG tablet Take 0.5 mg  by mouth 2 (two) times daily.    [provider]  colchicine 0.6 MG tablet Take 0.6-1.2 mg by mouth 2 (two) times daily as needed (for gout flare).     [provider]  escitalopram (LEXAPRO) 20 MG tablet Take 2 tablets (40 mg total) by mouth at bedtime. 02/25/17   [name], MD  ezetimibe (ZETIA) 10 MG tablet Take 1 tablet (10 mg total) by mouth every morning. 03/26/19   [name], MD  fluticasone (FLONASE) 50 MCG/ACT nasal spray USE 2 SPRAYS IN EACH  NOSTRIL DAILY 11/10/18   [name], MD  gabapentin  (NEURONTIN) 300 MG capsule Take 1 capsule (300 mg total) by mouth 3 (three) times daily as needed. 10/20/18   [name], MD  omeprazole (PRILOSEC) 20 MG capsule TAKE 1 CAPSULE BY MOUTH  DAILY 03/31/19   [name], MD  rOPINIRole (REQUIP) 3 MG tablet Take 1 tablet (3 mg total) by mouth at bedtime. 02/25/17   [name], MD  rOPINIRole (REQUIP) 4 MG tablet Take 4 mg by mouth at bedtime.  05/07/18   [provider]  rosuvastatin (CRESTOR) 5 MG tablet Take 1 tablet (5 mg total) by mouth daily. 10/20/18   [name], MD  traZODone (DESYREL) 100 MG tablet Take 50 mg by mouth daily as needed for sleep.     [provider]  ULORIC 40 MG tablet TAKE 1 TABLET BY MOUTH  DAILY 11/10/18   [name], MD    Family History Family History  Problem Relation Age of Onset  . Cancer Mother        ovarian  . Cirrhosis Father   . Alcohol abuse Father     Social History Social History   Tobacco Use  . Smoking status: Former Smoker    Packs/day: 0.50    Years: 30.00    Pack years: 15.00    Types: Cigarettes    Quit date: 07/24/1989    Years since quitting: 29.7  . Smokeless tobacco: Never Used  Substance Use Topics  . Alcohol use: Yes    Comment: social  . Drug use: No     Allergies   Augmentin [amoxicillin-pot clavulanate] and Statins   Review of Systems Review of Systems  Constitutional: Negative for fever.  HENT: Negative for sinus pressure.   Eyes: Negative for photophobia.  Respiratory: Negative for shortness of breath.   Cardiovascular: Negative for chest pain and palpitations.  Gastrointestinal: Negative for abdominal pain and nausea.  Genitourinary: Negative for flank pain.  Skin: Positive for wound.  Neurological: Negative for dizziness, seizures, syncope, facial asymmetry, speech difficulty, weakness, light-headedness and headaches.     Physical Exam Updated Vital Signs BP 132/71 (BP Location: Left Arm)   Pulse 73   Temp  99.2 F (37.3 C) (Oral)   Resp 16   SpO2 97%   Physical Exam Vitals signs and nursing note reviewed.  Constitutional:      Appearance: Normal appearance. He is well-developed.  HENT:     Head: Normocephalic.   Eyes:     General: No scleral icterus.    Pupils: Pupils are equal, round, and reactive to light.  Neck:     Musculoskeletal: Normal range of motion.  Cardiovascular:     Heart sounds: Normal heart sounds.  Pulmonary:     Effort: Pulmonary effort is normal.     Breath sounds: Normal breath sounds. No wheezing.  Chest:  Chest wall: No tenderness.  Abdominal:     General: Bowel sounds are normal. There is no distension.     Palpations: Abdomen is soft.     Tenderness: There is no abdominal tenderness.  Musculoskeletal:        General: No tenderness or deformity.  Skin:    General: Skin is warm and dry.  Neurological:     Mental Status: He is alert and oriented to person, place, and time.     Comments: Alert, oriented, thought content appropriate. Speech fluent without evidence of aphasia. Able to follow 2 step commands without difficulty.  Cranial Nerves:  II:  Peripheral visual fields grossly normal, pupils, round, reactive to light III,IV, VI: ptosis not present, extra-ocular motions intact bilaterally  V,VII: smile symmetric, facial light touch sensation equal VIII: hearing grossly normal bilaterally  IX,X: midline uvula rise  XI: bilateral shoulder shrug equal and strong XII: midline tongue extension  Motor:  5/5 in upper and lower extremities bilaterally including strong and equal grip strength and dorsiflexion/plantar flexion Sensory: light touch normal in all extremities.  Cerebellar: normal finger-to-nose with bilateral upper extremities, pronator drift negative Gait: normal gait and balance       ED Treatments / Results  Labs (all labs ordered are listed, but only abnormal results are displayed) Labs Reviewed - No data to display  EKG None   Radiology No results found.  Procedures Procedures (including critical care time)  Medications Ordered in ED Medications  Tdap (BOOSTRIX) injection 0.5 mL (0.5 mLs Intramuscular Given 04/26/19 1545)     Initial Impression / Assessment and Plan / ED Course  I have reviewed the triage vital signs and the nursing notes.  Pertinent labs & imaging results that were available during my care of the patient were reviewed by me and considered in my medical decision making (see chart for details).  Clinical Course as of Apr 25 1606  Mon Apr 25, 5825  [id number] 73 year old male not on anticoagulation had a mechanical trip and fall striking his head.  He denies any loss consciousness.  He is here for evaluation of abrasion on his forehead.  Last tetanus unknown.  He declined head CT.  Will provide wound care and likely discharge with PCP follow-up.   [MB]    Clinical Course User Index [MB] Hayden Rasmussen, MD      Patient independent by medical history presents ED status post mechanical fall, he was attempting to walk on a treadmill when he suddenly fell causing an abrasion to the frontal aspect of his forehead.  He reports no pain at the area, does have a superficial abrasion to the area.  Last tetanus vaccine is unknown, will update this today for patient.  Otherwise exam is unremarkable, he is not complaining of any pain, loss of consciousness, blood thinners, other complaints.  Discussed risks and benefits of obtaining CT imaging to rule out any intracranial hemorrhage, patient deferred this treatment at this time as he reports he did not lose consciousness, is not dizzy, headache, vomiting or any complaints.  Will clean wound no further assess any repair.  4:07 PM patient received tetanus vaccine, I personally cleaned off his wound, there is no need for surgical repair.  Explained to patient that he will need to keep his wound clean and dry and away from the sun.  Patient is agreeable this  time.  Return precautions discussed at length.  Portions of this note were generated with Lobby[employer]. Dictation errors  may occur despite best attempts at proofreading.  Final Clinical Impressions(s) / ED Diagnoses   Final diagnoses:  Fall, initial encounter  Abrasion of forehead, initial encounter    ED Discharge Orders    None       Janeece Fitting, PA-C 04/26/19 1607    Hayden Rasmussen, MD 04/26/19 1930

## 2019-04-26 NOTE — ED Triage Notes (Signed)
Patient reports trip and fall on treadmill today. Laceration to forehead. Bleeding controlled. Denies blood thinners. Denies dizziness, neck, and back pain. Ambulatory.

## 2019-04-29 ENCOUNTER — Encounter: Payer: Medicare Other | Admitting: Family Medicine

## 2019-04-29 ENCOUNTER — Ambulatory Visit (INDEPENDENT_AMBULATORY_CARE_PROVIDER_SITE_OTHER): Payer: Medicare Other | Admitting: Family Medicine

## 2019-04-29 ENCOUNTER — Other Ambulatory Visit: Payer: Self-pay

## 2019-04-29 ENCOUNTER — Encounter: Payer: Self-pay | Admitting: Family Medicine

## 2019-04-29 VITALS — BP 110/70 | HR 58 | Temp 98.2°F | Resp 97 | Ht 69.0 in | Wt 213.0 lb

## 2019-04-29 DIAGNOSIS — Z23 Encounter for immunization: Secondary | ICD-10-CM

## 2019-04-29 DIAGNOSIS — Z0001 Encounter for general adult medical examination with abnormal findings: Secondary | ICD-10-CM

## 2019-04-29 DIAGNOSIS — Z0000 Encounter for general adult medical examination without abnormal findings: Secondary | ICD-10-CM

## 2019-04-29 DIAGNOSIS — Z905 Acquired absence of kidney: Secondary | ICD-10-CM

## 2019-04-29 DIAGNOSIS — N1831 Chronic kidney disease, stage 3a: Secondary | ICD-10-CM | POA: Diagnosis not present

## 2019-04-29 DIAGNOSIS — Z125 Encounter for screening for malignant neoplasm of prostate: Secondary | ICD-10-CM

## 2019-04-29 DIAGNOSIS — Z8551 Personal history of malignant neoplasm of bladder: Secondary | ICD-10-CM

## 2019-04-29 DIAGNOSIS — Z1211 Encounter for screening for malignant neoplasm of colon: Secondary | ICD-10-CM

## 2019-04-29 DIAGNOSIS — R7303 Prediabetes: Secondary | ICD-10-CM

## 2019-04-29 DIAGNOSIS — Z13220 Encounter for screening for lipoid disorders: Secondary | ICD-10-CM | POA: Diagnosis not present

## 2019-04-29 NOTE — Progress Notes (Signed)
Subjective:    Patient ID: [name], male    DOB: 1946-06-03, 73 y.o.   MRN: [id number]  HPI Patient is a pleasant 73 y/o WM here today for complete physical exam.  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 His bladder has been completely removed. They also removed his prostate at the same time. They did find a small focus of prostate cancer when he did this and therefore he is due for PSA.  Last colonoscopy documented in his chart was November 2010.  Therefore he appears to be due for a colonoscopy.  He is also due for his flu shot as well as Shingrix.  Overall he is doing very well.  Recently had an accident running on a treadmill and suffered an abrasion to his forehead just above his right eye and above his nasal bridge.  There is a triangular-shaped skin avulsion that appears to be healing well.  He did not require stitches although he did go to the emergency room and he did receive his tetanus shot.  He denies any issues with falls that were not related to vigorous activity, depression, or memory loss Immunization History  Administered Date(s) Administered  . Influenza Split 05/25/2013  . Influenza,inj,Quad PF,6+ Mos 04/22/2014, 04/28/2015, 05/09/2016, 03/07/2017, 03/03/2018  . Pneumococcal Conjugate-13 04/22/2014  . Pneumococcal Polysaccharide-23 04/28/2015  . Tdap 04/26/2019  . Zoster 01/27/2009   Past Medical History:  Diagnosis Date  . Allergy   . Bacteremia due to Gram-negative bacteria 04/13/2015  . Bladder rupture 2010  . Bladder tumor   . Cancer ([place])    bladder  . Depression   . Gout   . 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 Hyperlipidemia   . Restless leg syndrome, controlled    Past Surgical History:  Procedure Laterality Date  . APPENDECTOMY    . BLADDER SURGERY  2010  . CYSTOSCOPY W/ RETROGRADES Bilateral 04/21/2013   Procedure: CYSTOSCOPY WITH RETROGRADE PYELOGRAM;  Surgeon: Molli Hazard, MD;  Location: [facility];  Service: Urology;   Laterality: Bilateral;  . FRACTURE SURGERY Right 01/05/2005   lower leg/ankle  . NASAL SEPTUM SURGERY    . TONSILLECTOMY AND ADENOIDECTOMY    . TRANSURETHRAL RESECTION OF BLADDER TUMOR WITH GYRUS (TURBT-GYRUS) N/A 04/21/2013   Procedure: TRANSURETHRAL RESECTION OF BLADDER TUMOR WITH GYRUS (TURBT-GYRUS), cystoscopy;  Surgeon: Molli Hazard, MD;  Location: [facility];  Service: Urology;  Laterality: N/A;   Current Outpatient Medications on File Prior to Visit  Medication Sig Dispense Refill  . acetaminophen (TYLENOL) 500 MG tablet Take 1 tablet (500 mg total) by mouth every 6 (six) hours as needed for mild pain, moderate pain or headache. 30 tablet 0  . allopurinol (ZYLOPRIM) 300 MG tablet Take 1 tablet (300 mg total) by mouth daily. 90 tablet 3  . aspirin EC 81 MG tablet Take 81 mg by mouth at bedtime.    . clonazePAM (KLONOPIN) 0.5 MG tablet Take 0.5 mg by mouth 2 (two) times daily.  0  . colchicine 0.6 MG tablet Take 0.6-1.2 mg by mouth 2 (two) times daily as needed (for gout flare).     [name] escitalopram (LEXAPRO) 20 MG tablet Take 2 tablets (40 mg total) by mouth at bedtime. 180 tablet 3  . ezetimibe (ZETIA) 10 MG tablet Take 1 tablet (10 mg total) by mouth every morning. 90 tablet 3  . fluticasone (FLONASE) 50 MCG/ACT nasal spray USE 2 SPRAYS IN EACH  NOSTRIL DAILY 48  g 3  . gabapentin (NEURONTIN) 300 MG capsule Take 1 capsule (300 mg total) by mouth 3 (three) times daily as needed. 270 capsule 3  . omeprazole (PRILOSEC) 20 MG capsule TAKE 1 CAPSULE BY MOUTH  DAILY 90 capsule 1  . rOPINIRole (REQUIP) 3 MG tablet Take 1 tablet (3 mg total) by mouth at bedtime. 90 tablet 3  . rOPINIRole (REQUIP) 4 MG tablet Take 4 mg by mouth at bedtime.     . rosuvastatin (CRESTOR) 5 MG tablet Take 1 tablet (5 mg total) by mouth daily. 90 tablet 3  . traZODone (DESYREL) 100 MG tablet Take 50 mg by mouth daily as needed for sleep.     [name] ULORIC 40 MG tablet TAKE 1 TABLET BY MOUTH  DAILY 90 tablet 3   No  current facility-administered medications on file prior to visit.    Allergies  Allergen Reactions  . Augmentin [Amoxicillin-Pot Clavulanate] Nausea And Vomiting and Other (See Comments)    Has patient had a PCN reaction causing immediate rash, facial/tongue/throat swelling, SOB or lightheadedness with hypotension: No Has patient had a PCN reaction causing severe rash involving mucus membranes or skin necrosis: No Has patient had a PCN reaction that required hospitalization No Has patient had a PCN reaction occurring within the last 10 years: No If all of the above answers are "NO", then may proceed with Cephalosporin use.  . Statins Other (See Comments)    Reaction:  Muscle pain    Social History   Socioeconomic History  . Marital status: Married    Spouse name: Not on file  . Number of children: Not on file  . Years of education: Not on file  . Highest education level: Not on file  Occupational History  . Not on file  Social Needs  . Financial resource strain: Not on file  . Food insecurity    Worry: Not on file    Inability: Not on file  . Transportation needs    Medical: Not on file    Non-medical: Not on file  Tobacco Use  . Smoking status: Former Smoker    Packs/day: 0.50    Years: 30.00    Pack years: 15.00    Types: Cigarettes    Quit date: 07/24/1989    Years since quitting: 29.7  . Smokeless tobacco: Never Used  Substance and Sexual Activity  . Alcohol use: Yes    Comment: social  . Drug use: No  . Sexual activity: Not on file  Lifestyle  . Physical activity    Days per week: Not on file    Minutes per session: Not on file  . Stress: Not on file  Relationships  . Social Herbalist on phone: Not on file    Gets together: Not on file    Attends religious service: Not on file    Active member of club or organization: Not on file    Attends meetings of clubs or organizations: Not on file    Relationship status: Not on file  . Intimate partner  violence    Fear of current or ex partner: Not on file    Emotionally abused: Not on file    Physically abused: Not on file    Forced sexual activity: Not on file  Other Topics Concern  . 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 Cancer Mother        ovarian  . Cirrhosis Father   . Alcohol abuse Father      Review of Systems  All other systems reviewed and are negative.      Objective:   Physical Exam  Constitutional: He is oriented to person, place, and time. He appears well-developed and well-nourished. No distress.  HENT:  Head: Normocephalic and atraumatic.  Right Ear: External ear normal.  Left Ear: External ear normal.  Nose: Nose normal.  Mouth/Throat: Oropharynx is clear and moist. No oropharyngeal exudate.  Eyes: Pupils are equal, round, and reactive to light. Conjunctivae and EOM are normal. Right eye exhibits no discharge. Left eye exhibits no discharge. No scleral icterus.  Neck: Normal range of motion. Neck supple. No JVD present. No tracheal deviation present. No thyromegaly present.  Cardiovascular: Normal rate, regular rhythm, normal heart sounds and intact distal pulses. Exam reveals no gallop and no friction rub.  No murmur heard. Pulmonary/Chest: Effort normal and breath sounds normal. No stridor. No respiratory distress. He has no wheezes. He has no rales. He exhibits no tenderness.  Abdominal: Soft. Bowel sounds are normal. He exhibits no distension and no mass. There is no abdominal tenderness. There is no rebound and no guarding.  Musculoskeletal: Normal range of motion.        General: No tenderness, deformity or edema.  Lymphadenopathy:    He has no cervical adenopathy.  Neurological: He is alert and oriented to person, place, and time. He has normal reflexes. No cranial nerve deficit. He exhibits normal muscle tone. Coordination normal.  Skin: Skin is  warm. No rash noted. He is not diaphoretic. No erythema. No pallor.  Psychiatric: He has a normal mood and affect. His behavior is normal. Judgment and thought content normal.  Vitals reviewed.         Assessment & Plan:  General medical exam  Prostate cancer screening - Plan: PSA  Screening cholesterol level - Plan: CBC with Differential/Platelet, COMPLETE METABOLIC PANEL WITH GFR, Lipid panel  Stage 3a chronic kidney disease  History of nephrectomy  History of bladder cancer  Prediabetes - Plan: Hemoglobin A1c, CBC with Differential/Platelet, COMPLETE METABOLIC PANEL WITH GFR, Lipid panel  Colon cancer screening - Plan: Ambulatory referral to Gastroenterology  Patient's physical exam today is normal.  His blood pressure is excellent.  I will give the patient his high-dose flu shot today.  I also will make a referral to GI for repeat colonoscopy.  This will most likely be his last required colonoscopy.  We did discuss Cologuard but ultimately the patient felt colonoscopy was better for him.  Patient's immunizations are otherwise up-to-date.  I will monitor his chronic kidney disease with a CMP.  I will monitor for anemia or bone marrow abnormalities with a CBC.  I will monitor his hyperlipidemia with checking a fasting lipid panel.  He does have a mild prediabetes with hemoglobin A1c of 5.9 in February so we will recheck his hemoglobin A1c as well.  Regular anticipatory guidance is provided

## 2019-04-30 ENCOUNTER — Encounter: Payer: Self-pay | Admitting: Gastroenterology

## 2019-05-03 NOTE — Addendum Note (Signed)
Addended by: Shary Decamp B on: 05/03/2019 03:51 PM   Modules accepted: Orders

## 2019-05-07 ENCOUNTER — Other Ambulatory Visit: Payer: Medicare Other

## 2019-05-07 ENCOUNTER — Other Ambulatory Visit: Payer: Self-pay

## 2019-05-07 DIAGNOSIS — Z13220 Encounter for screening for lipoid disorders: Secondary | ICD-10-CM | POA: Diagnosis not present

## 2019-05-07 DIAGNOSIS — Z125 Encounter for screening for malignant neoplasm of prostate: Secondary | ICD-10-CM | POA: Diagnosis not present

## 2019-05-07 DIAGNOSIS — R7303 Prediabetes: Secondary | ICD-10-CM | POA: Diagnosis not present

## 2019-05-07 DIAGNOSIS — E782 Mixed hyperlipidemia: Secondary | ICD-10-CM | POA: Diagnosis not present

## 2019-05-08 LAB — LIPID PANEL
Cholesterol: 110 mg/dL (ref <200)
HDL: 40 mg/dL (ref >=40)
LDL Cholesterol (Calc): 48 mg/dL (calc)
Non-HDL Cholesterol (Calc): 70 mg/dL (calc) (ref <130)
Total CHOL/HDL Ratio: 2.8 (calc) (ref <5.0)
Triglycerides: 134 mg/dL (ref <150)

## 2019-05-08 LAB — COMPLETE METABOLIC PANEL WITH GFR
AG Ratio: 1.6 (calc) (ref 1.0–2.5)
ALT: 18 U/L (ref 9–46)
AST: 19 U/L (ref 10–35)
Albumin: 4.1 g/dL (ref 3.6–5.1)
Alkaline phosphatase (APISO): 115 U/L (ref 35–144)
BUN/Creatinine Ratio: 14 (calc) (ref 6–22)
BUN: 25 mg/dL (ref 7–25)
CO2: 19 mmol/L — ABNORMAL LOW (ref 20–32)
Calcium: 8.8 mg/dL (ref 8.6–10.3)
Chloride: 108 mmol/L (ref 98–110)
Creat: 1.78 mg/dL — ABNORMAL HIGH (ref 0.70–1.18)
GFR, Est African American: 43 mL/min/{1.73_m2} — ABNORMAL LOW (ref >=60)
GFR, Est Non African American: 37 mL/min/{1.73_m2} — ABNORMAL LOW (ref >=60)
Globulin: 2.6 g/dL (calc) (ref 1.9–3.7)
Glucose, Bld: 111 mg/dL — ABNORMAL HIGH (ref 65–99)
Potassium: 4.5 mmol/L (ref 3.5–5.3)
Sodium: 140 mmol/L (ref 135–146)
Total Bilirubin: 0.3 mg/dL (ref 0.2–1.2)
Total Protein: 6.7 g/dL (ref 6.1–8.1)

## 2019-05-08 LAB — CBC WITH DIFFERENTIAL/PLATELET
Absolute Monocytes: 513 cells/uL (ref 200–950)
Basophils Absolute: 41 cells/uL (ref 0–200)
Basophils Relative: 0.7 %
Eosinophils Absolute: 142 cells/uL (ref 15–500)
Eosinophils Relative: 2.4 %
HCT: 41.2 % (ref 38.5–50.0)
Hemoglobin: 13.8 g/dL (ref 13.2–17.1)
Lymphs Abs: 1434 cells/uL (ref 850–3900)
MCH: 30.9 pg (ref 27.0–33.0)
MCHC: 33.5 g/dL (ref 32.0–36.0)
MCV: 92.2 fL (ref 80.0–100.0)
MPV: 9.8 fL (ref 7.5–12.5)
Monocytes Relative: 8.7 %
Neutro Abs: 3770 cells/uL (ref 1500–7800)
Neutrophils Relative %: 63.9 %
Platelets: 210 10*3/uL (ref 140–400)
RBC: 4.47 10*6/uL (ref 4.20–5.80)
RDW: 13.5 % (ref 11.0–15.0)
Total Lymphocyte: 24.3 %
WBC: 5.9 10*3/uL (ref 3.8–10.8)

## 2019-05-08 LAB — PSA: PSA: <0.1 ng/mL (ref <=4.0)

## 2019-05-08 LAB — HEMOGLOBIN A1C
Hgb A1c MFr Bld: 5.9 % of total Hgb — ABNORMAL HIGH (ref <5.7)
Mean Plasma Glucose: 123 (calc)
eAG (mmol/L): 6.8 (calc)

## 2019-05-19 DIAGNOSIS — F331 Major depressive disorder, recurrent, moderate: Secondary | ICD-10-CM | POA: Diagnosis not present

## 2019-05-19 DIAGNOSIS — Z634 Disappearance and death of family member: Secondary | ICD-10-CM | POA: Diagnosis not present

## 2019-05-19 DIAGNOSIS — F411 Generalized anxiety disorder: Secondary | ICD-10-CM | POA: Diagnosis not present

## 2019-06-16 ENCOUNTER — Ambulatory Visit (AMBULATORY_SURGERY_CENTER): Payer: Medicare Other | Admitting: *Deleted

## 2019-06-16 ENCOUNTER — Other Ambulatory Visit: Payer: Self-pay

## 2019-06-16 VITALS — Temp 96.9°F | Ht 69.0 in | Wt 216.0 lb

## 2019-06-16 DIAGNOSIS — Z1159 Encounter for screening for other viral diseases: Secondary | ICD-10-CM

## 2019-06-16 DIAGNOSIS — Z8546 Personal history of malignant neoplasm of prostate: Secondary | ICD-10-CM | POA: Diagnosis not present

## 2019-06-16 DIAGNOSIS — Z1211 Encounter for screening for malignant neoplasm of colon: Secondary | ICD-10-CM

## 2019-06-16 DIAGNOSIS — N130 Hydronephrosis with ureteropelvic junction obstruction: Secondary | ICD-10-CM | POA: Diagnosis not present

## 2019-06-16 MED ORDER — NA SULFATE-K SULFATE-MG SULF 17.5-3.13-1.6 GM/177ML PO SOLN: ORAL | 0 refills | Status: DC

## 2019-06-16 NOTE — Progress Notes (Signed)
Patient is here in-person for PV. Patient denies any allergies to eggs or soy. Patient denies any problems with anesthesia/sedation. Patient denies any oxygen use at home. Patient denies taking any diet/weight loss medications or blood thinners. Patient is not being treated for MRSA or C-diff. EMMI education assisgned to the patient for the procedure, this was explained and instructions given to patient. COVID-19 screening test is on 12/18, the pt is aware. Pt is aware that care partner will wait in the car during procedure; if they feel like they will be too hot or cold to wait in the car; they may wait in the 4 th floor lobby. Patient is aware to bring only one care partner. We want them to wear a mask (we do not have any that we can provide them), practice social distancing, and we will check their temperatures when they get here.  I did remind the patient that their care partner needs to stay in the parking lot the entire time and have a cell phone available, we will call them when the pt is ready for discharge. Patient will wear mask into building.

## 2019-06-17 ENCOUNTER — Other Ambulatory Visit: Payer: Self-pay | Admitting: Family Medicine

## 2019-06-25 ENCOUNTER — Other Ambulatory Visit: Payer: Self-pay | Admitting: Gastroenterology

## 2019-06-25 ENCOUNTER — Ambulatory Visit (INDEPENDENT_AMBULATORY_CARE_PROVIDER_SITE_OTHER): Payer: Medicare Other

## 2019-06-25 DIAGNOSIS — Z1159 Encounter for screening for other viral diseases: Secondary | ICD-10-CM

## 2019-06-28 LAB — SARS CORONAVIRUS 2 (TAT 6-24 HRS): SARS Coronavirus 2: NEGATIVE

## 2019-06-30 ENCOUNTER — Encounter: Payer: Self-pay | Admitting: Gastroenterology

## 2019-06-30 ENCOUNTER — Ambulatory Visit (AMBULATORY_SURGERY_CENTER): Payer: Medicare Other | Admitting: Gastroenterology

## 2019-06-30 ENCOUNTER — Other Ambulatory Visit: Payer: Self-pay

## 2019-06-30 VITALS — BP 122/68 | HR 61 | Temp 98.0°F | Resp 13 | Ht 69.0 in | Wt 216.0 lb

## 2019-06-30 DIAGNOSIS — Z1211 Encounter for screening for malignant neoplasm of colon: Secondary | ICD-10-CM

## 2019-06-30 MED ORDER — SODIUM CHLORIDE 0.9 % IV SOLN: 500.0000 mL | Freq: Once | INTRAVENOUS | Status: DC

## 2019-06-30 NOTE — Op Note (Signed)
[facility] Patient Name: [name] Procedure Date: 06/30/2019 2:56 PM MRN: [id number] Endoscopist: Mallie Mussel L. Loletha Carrow , MD Age: 73 Referring MD:  Date of Birth: 07/18/1945 Gender: Male Account #: [id number] Procedure:                Colonoscopy Indications:              Screening for colorectal malignant neoplasm (no                            polyps last colonoscopy 05/2009) Medicines:                Monitored Anesthesia Care Procedure:                Pre-Anesthesia Assessment:                           - Prior to the procedure, a History and Physical                            was performed, and patient medications and                            allergies were reviewed. The patient's tolerance of                            previous anesthesia was also reviewed. The risks                            and benefits of the procedure and the sedation                            options and risks were discussed with the patient.                            All questions were answered, and informed consent                            was obtained. Prior Anticoagulants: The patient has                            taken no previous anticoagulant or antiplatelet                            agents except for aspirin. ASA Grade Assessment:                            III - A patient with severe systemic disease. After                            reviewing the risks and benefits, the patient was                            deemed in satisfactory condition to undergo the  procedure.                           After obtaining informed consent, the colonoscope                            was passed under direct vision. Throughout the                            procedure, the patient's blood pressure, pulse, and                            oxygen saturations were monitored continuously. The                            Colonoscope was introduced through the anus and                advanced to the the cecum, identified by                            appendiceal orifice and ileocecal valve. The                            colonoscopy was performed with difficulty due to                            restricted mobility of the colon and a tortuous                            colon. Successful completion of the procedure was                            aided by changing the patient to a supine position                            and using manual pressure. The patient tolerated                            the procedure well. The quality of the bowel                            preparation was good. The ileocecal valve,                            appendiceal orifice, and rectum were photographed. Scope In: 2:56:58 PM Scope Out: 3:16:49 PM Scope Withdrawal Time: 0 hours 10 minutes 48 seconds  Total Procedure Duration: 0 hours 19 minutes 51 seconds  Findings:                 The perianal and digital rectal examinations were                            normal.  The sigmoid colon was significantly tortuous and                            felt "fixed" in pelvis - most likely due to prior                            bladder resection.                           The exam was otherwise without abnormality on                            direct and retroflexion views. Complications:            No immediate complications. Estimated Blood Loss:     Estimated blood loss: none. Impression:               - Tortuous colon.                           - The examination was otherwise normal on direct                            and retroflexion views.                           - No specimens collected. Recommendation:           - Patient has a contact number available for                            emergencies. The signs and symptoms of potential                            delayed complications were discussed with the                            patient. Return to  normal activities tomorrow.                            Written discharge instructions were provided to the                            patient.                           - Resume previous diet.                           - Continue present medications.                           - Based on current guidelines, no repeat                            colonoscopy or other colon cancer screening  necessary. Dearies Meikle L. Loletha Carrow, MD 06/30/2019 3:22:01 PM This report has been signed electronically.

## 2019-06-30 NOTE — Progress Notes (Signed)
Pt experienced N/V without any problems with O2sat, Zofran and Robinal given, vss

## 2019-06-30 NOTE — Patient Instructions (Signed)
YOU HAD AN ENDOSCOPIC PROCEDURE TODAY AT THE [facility]:   Refer to the procedure report that was given to you for any specific questions about what was found during the examination.  If the procedure report does not answer your questions, please call your gastroenterologist to clarify.  If you requested that your care partner not be given the details of your procedure findings, then the procedure report has been included in a sealed envelope for you to review at your convenience later.  YOU SHOULD EXPECT: Some feelings of bloating in the abdomen. Passage of more gas than usual.  Walking can help get rid of the air that was put into your GI tract during the procedure and reduce the bloating. If you had a lower endoscopy (such as a colonoscopy or flexible sigmoidoscopy) you may notice spotting of blood in your stool or on the toilet paper. If you underwent a bowel prep for your procedure, you may not have a normal bowel movement for a few days.  Please Note:  You might notice some irritation and congestion in your nose or some drainage.  This is from the oxygen used during your procedure.  There is no need for concern and it should clear up in a day or so.  SYMPTOMS TO REPORT IMMEDIATELY:   Following lower endoscopy (colonoscopy or flexible sigmoidoscopy):  Excessive amounts of blood in the stool  Significant tenderness or worsening of abdominal pains  Swelling of the abdomen that is new, acute  Fever of 100F or higher  For urgent or emergent issues, a gastroenterologist can be reached at any hour by calling [phone].   DIET:  We do recommend a small meal at first, but then you may proceed to your regular diet.  Drink plenty of fluids but you should avoid alcoholic beverages for 24 hours.  ACTIVITY:  You should plan to take it easy for the rest of today and you should NOT DRIVE or use heavy machinery until tomorrow (because of the sedation medicines used during the test).     FOLLOW UP: Our staff will call the number listed on your records 48-72 hours following your procedure to check on you and address any questions or concerns that you may have regarding the information given to you following your procedure. If we do not reach you, we will leave a message.  We will attempt to reach you two times.  During this call, we will ask if you have developed any symptoms of COVID 19. If you develop any symptoms (ie: fever, flu-like symptoms, shortness of breath, cough etc.) before then, please call [phone].  If you test positive for Covid 19 in the 2 weeks post procedure, please call and report this information to us.    If any biopsies were taken you will be contacted by phone or by letter within the next 1-3 weeks.  Please call us at [phone] if you have not heard about the biopsies in 3 weeks.    SIGNATURES/CONFIDENTIALITY: You and/or your care partner have signed paperwork which will be entered into your electronic medical record.  These signatures attest to the fact that that the information above on your After Visit Summary has been reviewed and is understood.  Full responsibility of the confidentiality of this discharge information lies with you and/or your care-partner. 

## 2019-06-30 NOTE — Progress Notes (Signed)
Temp   VS  Pt's states no medical or surgical changes since previsit or office visit.

## 2019-06-30 NOTE — Progress Notes (Signed)
Report given to PACU, vss 

## 2019-07-05 ENCOUNTER — Telehealth: Payer: Self-pay

## 2019-07-05 NOTE — Telephone Encounter (Signed)
Attempted to reach patient for post-procedure f/u call. No answer. Left message that we will make another attempt to reach him later today and for him to please not hesitate to call us if he has any questions/concerns regarding his care. 

## 2019-07-05 NOTE — Telephone Encounter (Signed)
Left message on 2nd follow up call. 

## 2019-07-21 ENCOUNTER — Other Ambulatory Visit: Payer: Medicare Other

## 2019-07-21 ENCOUNTER — Other Ambulatory Visit: Payer: Self-pay | Admitting: Family Medicine

## 2019-07-21 DIAGNOSIS — R7309 Other abnormal glucose: Secondary | ICD-10-CM

## 2019-07-21 LAB — GLUCOSE 16585: Glucose: 120 mg/dL — ABNORMAL HIGH (ref 65–99)

## 2019-07-30 ENCOUNTER — Other Ambulatory Visit: Payer: Self-pay | Admitting: Family Medicine

## 2019-07-30 DIAGNOSIS — G609 Hereditary and idiopathic neuropathy, unspecified: Secondary | ICD-10-CM

## 2019-08-02 NOTE — Telephone Encounter (Signed)
Last office visit: 04/29/2019 Last refill: 10/20/2018

## 2019-09-01 DIAGNOSIS — Z23 Encounter for immunization: Secondary | ICD-10-CM | POA: Diagnosis not present

## 2019-09-08 ENCOUNTER — Other Ambulatory Visit: Payer: Self-pay | Admitting: Family Medicine

## 2019-09-29 DIAGNOSIS — Z23 Encounter for immunization: Secondary | ICD-10-CM | POA: Diagnosis not present

## 2019-10-05 ENCOUNTER — Telehealth: Payer: Self-pay | Admitting: Family Medicine

## 2019-10-05 DIAGNOSIS — G609 Hereditary and idiopathic neuropathy, unspecified: Secondary | ICD-10-CM

## 2019-10-05 NOTE — Telephone Encounter (Signed)
Patient called in requesting a referral to neurology for his peripheral neuropathy. States he does not feel that the Gabapentin is working for him. Please advise?

## 2019-10-05 NOTE — Telephone Encounter (Signed)
I am fine with neurology referral.

## 2019-10-05 NOTE — Telephone Encounter (Signed)
Referral order placed.

## 2019-12-03 ENCOUNTER — Other Ambulatory Visit: Payer: Self-pay

## 2019-12-03 ENCOUNTER — Ambulatory Visit (INDEPENDENT_AMBULATORY_CARE_PROVIDER_SITE_OTHER): Payer: Medicare Other | Admitting: Diagnostic Neuroimaging

## 2019-12-03 ENCOUNTER — Encounter: Payer: Self-pay | Admitting: Diagnostic Neuroimaging

## 2019-12-03 VITALS — BP 115/66 | HR 70 | Ht 69.0 in | Wt 218.0 lb

## 2019-12-03 DIAGNOSIS — G629 Polyneuropathy, unspecified: Secondary | ICD-10-CM

## 2019-12-03 DIAGNOSIS — R6889 Other general symptoms and signs: Secondary | ICD-10-CM | POA: Diagnosis not present

## 2019-12-03 DIAGNOSIS — Z79899 Other long term (current) drug therapy: Secondary | ICD-10-CM

## 2019-12-03 NOTE — Patient Instructions (Signed)
NEUROPATHY - check labs - check EMG/NCS

## 2019-12-03 NOTE — Progress Notes (Signed)
[facility]  PATIENT: [name] DOB: 1946-07-02  REFERRING CLINICIAN: [name], MD HISTORY FROM: patient  REASON FOR VISIT: new consult    HISTORICAL  CHIEF COMPLAINT:  Chief Complaint  Patient presents with  . New Patient (Initial Visit)    rm 6, alone,   . Peripheral Neuropathy    pt states his pain has improved some since he has been more active     HISTORY OF PRESENT ILLNESS:   74 year old male with history of bladder cancer status post chemotherapy in 2014-2016, here for evaluation of neuropathy.  Patient notes onset of fuzzy abnormal feeling in his feet 2 to 3 years ago.  This converted him to burning stabbing cramping sensations in his toes, feet and calves.  Patient was evaluated by PCP, had some neuropathy work-up, but no specific cause was found.  He was tried on gabapentin 3 mg 3 times a day with mild relief.  Over the last 1 to 2 months symptoms have spontaneously improved.   REVIEW OF SYSTEMS: Full 14 system review of systems performed and negative with exception of: As per HPI.  ALLERGIES: Allergies  Allergen Reactions  . Augmentin [Amoxicillin-Pot Clavulanate] Nausea And Vomiting and Other (See Comments)    Has patient had a PCN reaction causing immediate rash, facial/tongue/throat swelling, SOB or lightheadedness with hypotension: No Has patient had a PCN reaction causing severe rash involving mucus membranes or skin necrosis: No Has patient had a PCN reaction that required hospitalization No Has patient had a PCN reaction occurring within the last 10 years: No If all of the above answers are "NO", then may proceed with Cephalosporin use.  . Statins Other (See Comments)    Reaction:  Muscle pain     HOME MEDICATIONS: Outpatient Medications Prior to Visit  Medication Sig Dispense Refill  . acetaminophen (TYLENOL) 500 MG tablet Take 1 tablet (500 mg total) by mouth every 6 (six) hours as needed for mild pain, moderate pain  or headache. 30 tablet 0  . allopurinol (ZYLOPRIM) 300 MG tablet Take 1 tablet (300 mg total) by mouth daily. 90 tablet 3  . aspirin EC 81 MG tablet Take 81 mg by mouth at bedtime.    . Cholecalciferol (VITAMIN D3 PO) Take by mouth.    . clonazePAM (KLONOPIN) 0.5 MG tablet Take 0.5 mg by mouth 2 (two) times daily as needed.    . colchicine 0.6 MG tablet Take 0.6-1.2 mg by mouth 2 (two) times daily as needed (for gout flare).     [name] escitalopram (LEXAPRO) 20 MG tablet Take 2 tablets (40 mg total) by mouth at bedtime. 180 tablet 3  . ezetimibe (ZETIA) 10 MG tablet Take 10 mg by mouth daily.    . fluticasone (FLONASE) 50 MCG/ACT nasal spray USE 2 SPRAYS IN EACH  NOSTRIL DAILY 48 g 3  . gabapentin (NEURONTIN) 300 MG capsule TAKE 1 CAPSULE BY MOUTH 3  TIMES DAILY AS NEEDED 270 capsule 3  . Omega-3 Fatty Acids (FISH OIL PO) Take by mouth.    [name] omeprazole (PRILOSEC) 20 MG capsule TAKE 1 CAPSULE BY MOUTH  DAILY 90 capsule 3  . rOPINIRole (REQUIP) 4 MG tablet Take 4 mg by mouth at bedtime.     . rosuvastatin (CRESTOR) 5 MG tablet TAKE 1 TABLET BY MOUTH  DAILY 90 tablet 3  . traZODone (DESYREL) 100 MG tablet Take 50 mg by mouth daily as needed for sleep.      No facility-administered medications  prior to visit.    PAST MEDICAL HISTORY: Past Medical History:  Diagnosis Date  . Allergy   . Bacteremia due to Gram-negative bacteria 04/13/2015  . Bladder rupture 2010  . Bladder tumor   . Cancer ([place])    bladder  . Chronic kidney disease   . Depression   . Gout   . 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 Hyperlipidemia   . Restless leg syndrome, controlled     PAST SURGICAL HISTORY: Past Surgical History:  Procedure Laterality Date  . APPENDECTOMY    . BLADDER SURGERY  2010  . COLONOSCOPY  06/05/2009  . CYSTOSCOPY W/ RETROGRADES Bilateral 04/21/2013   Procedure: CYSTOSCOPY WITH RETROGRADE PYELOGRAM;  Surgeon: Molli Hazard, MD;  Location: [facility];  Service: Urology;   Laterality: Bilateral;  . FRACTURE SURGERY Right 01/05/2005   lower leg/ankle  . NASAL SEPTUM SURGERY    . TONSILLECTOMY AND ADENOIDECTOMY    . TRANSURETHRAL RESECTION OF BLADDER TUMOR WITH GYRUS (TURBT-GYRUS) N/A 04/21/2013   Procedure: TRANSURETHRAL RESECTION OF BLADDER TUMOR WITH GYRUS (TURBT-GYRUS), cystoscopy;  Surgeon: Molli Hazard, MD;  Location: [facility];  Service: Urology;  Laterality: N/A;    FAMILY HISTORY: Family History  Problem Relation Age of Onset  . Cancer Mother        ovarian  . Cirrhosis Father   . Alcohol abuse Father   . Colon cancer Neg Hx   . Colon polyps Neg Hx   . Esophageal cancer Neg Hx   . Rectal cancer Neg Hx   . Stomach cancer Neg Hx     SOCIAL HISTORY: Social History   Socioeconomic History  . Marital status: Married    Spouse name: Not on file  . Number of children: Not on file  . Years of education: Not on file  . Highest education level: Not on file  Occupational History  . Not on file  Tobacco Use  . Smoking status: Former Smoker    Packs/day: 0.50    Years: 30.00    Pack years: 15.00    Types: Cigarettes    Quit date: 07/24/1989    Years since quitting: 30.3  . Smokeless tobacco: Never Used  Substance and Sexual Activity  . Alcohol use: Yes    Comment: social  . Drug use: No  . Sexual activity: Not on file  Other Topics Concern  . 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 Difficulty of Paying Living Expenses:   Food Insecurity:   . Worried About Charity fundraiser in the Last Year:   . Arboriculturist in the Last Year:   Transportation Needs:   . Film/video editor (Medical):   [name] Lack of Transportation (Non-Medical):   Physical Activity:   . Days of Exercise per Week:   . Minutes of Exercise per Session:   Stress:   . Feeling of Stress :   Social Connections:   . Frequency of Communication  with Friends and Family:   . Frequency of Social Gatherings with Friends and Family:   . Attends Religious Services:   . Active Member of Clubs or Organizations:   . Attends Archivist Meetings:   [name] Marital Status:   Intimate Partner Violence:   . Fear of Current or Ex-Partner:   . Emotionally Abused:   .  Physically Abused:   . Sexually Abused:      PHYSICAL EXAM  GENERAL EXAM/CONSTITUTIONAL: Vitals:  Vitals:   12/03/19 1013  BP: 115/66  Pulse: 70  Weight: 218 lb (98.9 kg)  Height: '5\' 9"'  (1.753 m)     Body mass index is 32.19 kg/m. Wt Readings from Last 3 Encounters:  12/03/19 218 lb (98.9 kg)  06/30/19 216 lb (98 kg)  06/16/19 216 lb (98 kg)     Patient is in no distress; well developed, nourished and groomed; neck is supple  CARDIOVASCULAR:  Examination of carotid arteries is normal; no carotid bruits  Regular rate and rhythm, no murmurs  Examination of peripheral vascular system by observation and palpation is normal  EYES:  Ophthalmoscopic exam of optic discs and posterior segments is normal; no papilledema or hemorrhages  No exam data present  MUSCULOSKELETAL:  Gait, strength, tone, movements noted in Neurologic exam below  NEUROLOGIC: MENTAL STATUS:  No flowsheet data found.  awake, alert, oriented to person, place and time  recent and remote memory intact  normal attention and concentration  language fluent, comprehension intact, naming intact  fund of knowledge appropriate  CRANIAL NERVE:   2nd - no papilledema on fundoscopic exam  2nd, 3rd, 4th, 6th - pupils equal and reactive to light, visual fields full to confrontation, extraocular muscles intact, no nystagmus  5th - facial sensation symmetric  7th - facial strength symmetric  8th - hearing intact  9th - palate elevates symmetrically, uvula midline  11th - shoulder shrug symmetric  12th - tongue protrusion midline  MOTOR:   normal bulk and tone, full  strength in the BUE, BLE  SENSORY:   normal and symmetric to light touch, pinprick, temperature, vibration; DECR IN ANKLES AND FEET  COORDINATION:   finger-nose-finger, fine finger movements normal  REFLEXES:   deep tendon reflexes present and symmetric; EXCEPT TRACE AT LEFT ANKLE  GAIT/STATION:   narrow based gait     DIAGNOSTIC DATA (LABS, IMAGING, TESTING) - I reviewed patient records, labs, notes, testing and imaging myself where available.  Lab Results  Component Value Date   WBC 5.9 05/07/2019   HGB 13.8 05/07/2019   HCT 41.2 05/07/2019   MCV 92.2 05/07/2019   PLT 210 05/07/2019      Component Value Date/Time   NA 140 05/07/2019 1212   NA 138 12/26/2015 1045   K 4.5 05/07/2019 1212   K 4.8 12/26/2015 1045   CL 108 05/07/2019 1212   CO2 19 (L) 05/07/2019 1212   CO2 23 12/26/2015 1045   GLUCOSE 111 (H) 05/07/2019 1212   GLUCOSE 194 (H) 12/26/2015 1045   BUN 25 05/07/2019 1212   BUN 22.4 12/26/2015 1045   CREATININE 1.78 (H) 05/07/2019 1212   CREATININE 2.3 (H) 12/26/2015 1045   CALCIUM 8.8 05/07/2019 1212   CALCIUM 9.1 12/26/2015 1045   PROT 6.7 05/07/2019 1212   PROT 7.2 12/26/2015 1045   ALBUMIN 3.6 05/11/2018 2247   ALBUMIN 3.5 12/26/2015 1045   AST 19 05/07/2019 1212   AST 16 12/26/2015 1045   ALT 18 05/07/2019 1212   ALT 17 12/26/2015 1045   ALKPHOS 88 05/11/2018 2247   ALKPHOS 124 12/26/2015 1045   BILITOT 0.3 05/07/2019 1212   BILITOT 0.34 12/26/2015 1045   GFRNONAA 37 (L) 05/07/2019 1212   GFRAA 43 (L) 05/07/2019 1212   Lab Results  Component Value Date   CHOL 110 05/07/2019   HDL 40 05/07/2019   LDLCALC 48 05/07/2019  TRIG 134 05/07/2019   CHOLHDL 2.8 05/07/2019   Lab Results  Component Value Date   HGBA1C 5.9 (H) 05/07/2019   Lab Results  Component Value Date   VITAMINB12 321 09/04/2018   Lab Results  Component Value Date   TSH 2.56 09/04/2018      ASSESSMENT AND PLAN  74 y.o. year old male here with signs and  symptoms consistent with peripheral neuropathy affecting the feet and ankles.  We'll proceed with further work-up with additional neuropathy labs and EMG nerve conduction study.  Neuropathy could be related to prior chemotherapy with cis-platinum and chronic kidney disease.  Dx:  1. Neuropathy   2. Other general symptoms and signs   3. Long-term use of high-risk medication     PLAN:  NEUROPATHY - check labs - check EMG/NCS  Orders Placed This Encounter  Procedures  . CBC with diff  . CMP  . Vitamin B12  . SPEP with IFE  . ANA w/Reflex  . SSA, SSB  . ESR  . CRP  . Vitamin B1  . Copper  . ANCA  . NCV with EMG(electromyography)   Return for for NCV/EMG.    Penni Bombard, MD 02/29/2352, 61:44 AM Certified in Neurology, Neurophysiology and Neuroimaging  [facility] [address], [place], [place] [address] [phone]

## 2019-12-08 DIAGNOSIS — D225 Melanocytic nevi of trunk: Secondary | ICD-10-CM | POA: Diagnosis not present

## 2019-12-08 DIAGNOSIS — D2262 Melanocytic nevi of left upper limb, including shoulder: Secondary | ICD-10-CM | POA: Diagnosis not present

## 2019-12-08 DIAGNOSIS — D485 Neoplasm of uncertain behavior of skin: Secondary | ICD-10-CM | POA: Diagnosis not present

## 2019-12-08 DIAGNOSIS — L570 Actinic keratosis: Secondary | ICD-10-CM | POA: Diagnosis not present

## 2019-12-08 DIAGNOSIS — D2261 Melanocytic nevi of right upper limb, including shoulder: Secondary | ICD-10-CM | POA: Diagnosis not present

## 2019-12-08 DIAGNOSIS — D1801 Hemangioma of skin and subcutaneous tissue: Secondary | ICD-10-CM | POA: Diagnosis not present

## 2019-12-08 DIAGNOSIS — D2222 Melanocytic nevi of left ear and external auricular canal: Secondary | ICD-10-CM | POA: Diagnosis not present

## 2019-12-08 DIAGNOSIS — L821 Other seborrheic keratosis: Secondary | ICD-10-CM | POA: Diagnosis not present

## 2019-12-13 LAB — C-REACTIVE PROTEIN: CRP: 7 mg/L (ref 0–10)

## 2019-12-13 LAB — COMPREHENSIVE METABOLIC PANEL
ALT: 17 IU/L (ref 0–44)
AST: 20 IU/L (ref 0–40)
Albumin/Globulin Ratio: 1.8 (ref 1.2–2.2)
Albumin: 4.3 g/dL (ref 3.7–4.7)
Alkaline Phosphatase: 126 IU/L — ABNORMAL HIGH (ref 48–121)
BUN/Creatinine Ratio: 14 (ref 10–24)
BUN: 26 mg/dL (ref 8–27)
Bilirubin Total: 0.3 mg/dL (ref 0.0–1.2)
CO2: 20 mmol/L (ref 20–29)
Calcium: 8.8 mg/dL (ref 8.6–10.2)
Chloride: 108 mmol/L — ABNORMAL HIGH (ref 96–106)
Creatinine, Ser: 1.84 mg/dL — ABNORMAL HIGH (ref 0.76–1.27)
GFR calc Af Amer: 41 mL/min/{1.73_m2} — ABNORMAL LOW (ref >59)
GFR calc non Af Amer: 36 mL/min/{1.73_m2} — ABNORMAL LOW (ref >59)
Globulin, Total: 2.4 g/dL (ref 1.5–4.5)
Glucose: 150 mg/dL — ABNORMAL HIGH (ref 65–99)
Potassium: 4.5 mmol/L (ref 3.5–5.2)
Sodium: 142 mmol/L (ref 134–144)
Total Protein: 6.7 g/dL (ref 6.0–8.5)

## 2019-12-13 LAB — MULTIPLE MYELOMA PANEL, SERUM
Albumin SerPl Elph-Mcnc: 3.5 g/dL (ref 2.9–4.4)
Albumin/Glob SerPl: 1.1 (ref 0.7–1.7)
Alpha 1: 0.2 g/dL (ref 0.0–0.4)
Alpha2 Glob SerPl Elph-Mcnc: 0.7 g/dL (ref 0.4–1.0)
B-Globulin SerPl Elph-Mcnc: 1.2 g/dL (ref 0.7–1.3)
Gamma Glob SerPl Elph-Mcnc: 1.1 g/dL (ref 0.4–1.8)
Globulin, Total: 3.2 g/dL (ref 2.2–3.9)
IgA/Immunoglobulin A, Serum: 344 mg/dL (ref 61–437)
IgG (Immunoglobin G), Serum: 1141 mg/dL (ref 603–1613)
IgM (Immunoglobulin M), Srm: 68 mg/dL (ref 15–143)

## 2019-12-13 LAB — CBC WITH DIFFERENTIAL/PLATELET
Basophils Absolute: 0 10*3/uL (ref 0.0–0.2)
Basos: 1 %
EOS (ABSOLUTE): 0.2 10*3/uL (ref 0.0–0.4)
Eos: 3 %
Hematocrit: 38 % (ref 37.5–51.0)
Hemoglobin: 13.3 g/dL (ref 13.0–17.7)
Immature Grans (Abs): 0 10*3/uL (ref 0.0–0.1)
Immature Granulocytes: 0 %
Lymphocytes Absolute: 0.9 10*3/uL (ref 0.7–3.1)
Lymphs: 17 %
MCH: 31.7 pg (ref 26.6–33.0)
MCHC: 35 g/dL (ref 31.5–35.7)
MCV: 91 fL (ref 79–97)
Monocytes Absolute: 0.4 10*3/uL (ref 0.1–0.9)
Monocytes: 7 %
Neutrophils Absolute: 4 10*3/uL (ref 1.4–7.0)
Neutrophils: 72 %
Platelets: 186 10*3/uL (ref 150–450)
RBC: 4.2 x10E6/uL (ref 4.14–5.80)
RDW: 13.3 % (ref 11.6–15.4)
WBC: 5.5 10*3/uL (ref 3.4–10.8)

## 2019-12-13 LAB — PAN-ANCA
ANCA Proteinase 3: <3.5 U/mL (ref 0.0–3.5)
Atypical pANCA: <1:20 {titer}
C-ANCA: <1:20 {titer}
Myeloperoxidase Ab: <9 U/mL (ref 0.0–9.0)
P-ANCA: <1:20 {titer}

## 2019-12-13 LAB — VITAMIN B1: Thiamine: 149.5 nmol/L (ref 66.5–200.0)

## 2019-12-13 LAB — SJOGREN'S SYNDROME ANTIBODS(SSA + SSB)
ENA SSA (RO) Ab: <0.2 AI (ref 0.0–0.9)
ENA SSB (LA) Ab: <0.2 AI (ref 0.0–0.9)

## 2019-12-13 LAB — VITAMIN B12: Vitamin B-12: 217 pg/mL — ABNORMAL LOW (ref 232–1245)

## 2019-12-13 LAB — ANA W/REFLEX: ANA Titer 1: NEGATIVE

## 2019-12-13 LAB — COPPER, SERUM: Copper: 110 ug/dL (ref 69–132)

## 2019-12-13 LAB — SEDIMENTATION RATE: Sed Rate: 32 mm/hr — ABNORMAL HIGH (ref 0–30)

## 2019-12-14 ENCOUNTER — Telehealth: Payer: Self-pay | Admitting: *Deleted

## 2019-12-14 NOTE — Telephone Encounter (Signed)
Spoke with patient and informed him his labs are unremarkable with exception of low b12. Dr Leta Baptist stated he needs replacement 1000 mcg orally daily and PCP to follow up.  Patient verbalized understanding, appreciation.

## 2019-12-22 ENCOUNTER — Ambulatory Visit (INDEPENDENT_AMBULATORY_CARE_PROVIDER_SITE_OTHER): Payer: Medicare Other | Admitting: Nurse Practitioner

## 2019-12-22 ENCOUNTER — Other Ambulatory Visit: Payer: Self-pay

## 2019-12-22 VITALS — BP 120/70 | HR 66 | Temp 98.1°F | Resp 18 | Wt 214.8 lb

## 2019-12-22 DIAGNOSIS — L989 Disorder of the skin and subcutaneous tissue, unspecified: Secondary | ICD-10-CM | POA: Diagnosis not present

## 2019-12-22 DIAGNOSIS — L02224 Furuncle of groin: Secondary | ICD-10-CM | POA: Diagnosis not present

## 2019-12-22 DIAGNOSIS — L0291 Cutaneous abscess, unspecified: Secondary | ICD-10-CM | POA: Diagnosis not present

## 2019-12-22 DIAGNOSIS — L02426 Furuncle of left lower limb: Secondary | ICD-10-CM | POA: Diagnosis not present

## 2019-12-22 MED ORDER — SULFAMETHOXAZOLE-TRIMETHOPRIM 800-160 MG PO TABS: 1.0000 | ORAL_TABLET | Freq: Two times a day (BID) | ORAL | 0 refills | Status: DC

## 2019-12-22 MED ORDER — SULFAMETHOXAZOLE-TRIMETHOPRIM 800-160 MG PO TABS
1.0000 | ORAL_TABLET | Freq: Two times a day (BID) | ORAL | 0 refills | Status: DC
Start: 2019-12-22 — End: 2019-12-22

## 2019-12-22 MED ORDER — MUPIROCIN CALCIUM 2 % EX CREA: 1.0000 "application " | TOPICAL_CREAM | Freq: Two times a day (BID) | CUTANEOUS | 0 refills | Status: DC

## 2019-12-22 NOTE — Progress Notes (Signed)
Established Patient Office Visit  Subjective:  Patient ID: [name], male    DOB: Dec 04, 1945  Age: 74 y.o. MRN: [id number]  CC:  Chief Complaint  Patient presents with  . Mass    on L leg above knee, bumps on stomach around waistline and thigh, started x1 week    HPI [name] is a 74 year old male presenting with multiple small red bumps that do not itch a few that he reports have pustules that he pops on his abdomen, inner thigh, one on left butt cheek, and two on left thigh. The first appeared to left lower thigh which he has popped several times expressing "puss" of yellow color. He denied fever/chills or insect bites. No others with similar sxs. He has tried no treatments. He feels the bumps are spreading.   Past Medical History:  Diagnosis Date  . Allergy   . Bacteremia due to Gram-negative bacteria 04/13/2015  . Bladder rupture 2010  . Bladder tumor   . Cancer ([place])    bladder  . Chronic kidney disease   . Depression   . Gout   . 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 Hyperlipidemia   . Restless leg syndrome, controlled     Past Surgical History:  Procedure Laterality Date  . APPENDECTOMY    . BLADDER SURGERY  2010  . COLONOSCOPY  06/05/2009  . CYSTOSCOPY W/ RETROGRADES Bilateral 04/21/2013   Procedure: CYSTOSCOPY WITH RETROGRADE PYELOGRAM;  Surgeon: Molli Hazard, MD;  Location: [facility];  Service: Urology;  Laterality: Bilateral;  . FRACTURE SURGERY Right 01/05/2005   lower leg/ankle  . NASAL SEPTUM SURGERY    . TONSILLECTOMY AND ADENOIDECTOMY    . TRANSURETHRAL RESECTION OF BLADDER TUMOR WITH GYRUS (TURBT-GYRUS) N/A 04/21/2013   Procedure: TRANSURETHRAL RESECTION OF BLADDER TUMOR WITH GYRUS (TURBT-GYRUS), cystoscopy;  Surgeon: Molli Hazard, MD;  Location: [facility];  Service: Urology;  Laterality: N/A;    Family History  Problem Relation Age of Onset  . Cancer Mother        ovarian  . Cirrhosis Father   . Alcohol abuse Father    . Colon cancer Neg Hx   . Colon polyps Neg Hx   . Esophageal cancer Neg Hx   . Rectal cancer Neg Hx   . Stomach cancer Neg Hx     Social History   Socioeconomic History  . Marital status: Married    Spouse name: Not on file  . Number of children: Not on file  . Years of education: Not on file  . Highest education level: Not on file  Occupational History  . Not on file  Tobacco Use  . Smoking status: Former Smoker    Packs/day: 0.50    Years: 30.00    Pack years: 15.00    Types: Cigarettes    Quit date: 07/24/1989    Years since quitting: 30.4  . Smokeless tobacco: Never Used  Vaping Use  . Vaping Use: Never used  Substance and Sexual Activity  . Alcohol use: Yes    Comment: social  . Drug use: No  . Sexual activity: Not on file  Other Topics Concern  . 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 Difficulty of Paying Living Expenses:   Food Insecurity:   . Worried About Running  Out of Food in the Last Year:   . [place] in the Last Year:   Transportation Needs:   . Lack of Transportation (Medical):   [name] Lack of Transportation (Non-Medical):   Physical Activity:   . Days of Exercise per Week:   . Minutes of Exercise per Session:   Stress:   . Feeling of Stress :   Social Connections:   . Frequency of Communication with Friends and Family:   . Frequency of Social Gatherings with Friends and Family:   . Attends Religious Services:   . Active Member of Clubs or Organizations:   . Attends Archivist Meetings:   [name] Marital Status:   Intimate Partner Violence:   . Fear of Current or Ex-Partner:   . Emotionally Abused:   [name] Physically Abused:   . Sexually Abused:     Outpatient Medications Prior to Visit  Medication Sig Dispense Refill  . acetaminophen (TYLENOL) 500 MG tablet Take 1 tablet (500 mg total) by mouth every 6 (six) hours  as needed for mild pain, moderate pain or headache. 30 tablet 0  . allopurinol (ZYLOPRIM) 300 MG tablet Take 1 tablet (300 mg total) by mouth daily. 90 tablet 3  . aspirin EC 81 MG tablet Take 81 mg by mouth at bedtime.    . Cholecalciferol (VITAMIN D3 PO) Take by mouth.    . clonazePAM (KLONOPIN) 0.5 MG tablet Take 0.5 mg by mouth 2 (two) times daily as needed.    . colchicine 0.6 MG tablet Take 0.6-1.2 mg by mouth 2 (two) times daily as needed (for gout flare).     [name] escitalopram (LEXAPRO) 20 MG tablet Take 2 tablets (40 mg total) by mouth at bedtime. 180 tablet 3  . ezetimibe (ZETIA) 10 MG tablet Take 10 mg by mouth daily.    . fluticasone (FLONASE) 50 MCG/ACT nasal spray USE 2 SPRAYS IN EACH  NOSTRIL DAILY 48 g 3  . gabapentin (NEURONTIN) 300 MG capsule TAKE 1 CAPSULE BY MOUTH 3  TIMES DAILY AS NEEDED 270 capsule 3  . Omega-3 Fatty Acids (FISH OIL PO) Take by mouth.    [name] omeprazole (PRILOSEC) 20 MG capsule TAKE 1 CAPSULE BY MOUTH  DAILY 90 capsule 3  . rOPINIRole (REQUIP) 4 MG tablet Take 4 mg by mouth at bedtime.     . rosuvastatin (CRESTOR) 5 MG tablet TAKE 1 TABLET BY MOUTH  DAILY 90 tablet 3  . traZODone (DESYREL) 100 MG tablet Take 50 mg by mouth daily as needed for sleep.     . vitamin B-12 (CYANOCOBALAMIN) 1000 MCG tablet Take 1,000 mcg by mouth daily.     No facility-administered medications prior to visit.    Allergies  Allergen Reactions  . Augmentin [Amoxicillin-Pot Clavulanate] Nausea And Vomiting and Other (See Comments)    Has patient had a PCN reaction causing immediate rash, facial/tongue/throat swelling, SOB or lightheadedness with hypotension: No Has patient had a PCN reaction causing severe rash involving mucus membranes or skin necrosis: No Has patient had a PCN reaction that required hospitalization No Has patient had a PCN reaction occurring within the last 10 years: No If all of the above answers are "NO", then may proceed with Cephalosporin use.  . Statins  Other (See Comments)    Reaction:  Muscle pain     ROS Review of Systems  All other systems reviewed and are negative.     Objective:    Physical Exam Vitals and nursing  note reviewed.  Constitutional:      Appearance: Normal appearance.  HENT:     Head: Normocephalic.  Eyes:     Extraocular Movements: Extraocular movements intact.     Conjunctiva/sclera: Conjunctivae normal.     Pupils: Pupils are equal, round, and reactive to light.  Cardiovascular:     Rate and Rhythm: Normal rate.  Pulmonary:     Effort: Pulmonary effort is normal.  Musculoskeletal:     Right lower leg: No edema.     Left lower leg: No edema.  Skin:    General: Skin is warm and dry.       Neurological:     General: No focal deficit present.     Mental Status: He is alert and oriented to person, place, and time.  Psychiatric:        Mood and Affect: Mood normal.        Behavior: Behavior normal.     BP 120/70 (BP Location: Left Arm, Patient Position: Sitting, Cuff Size: Normal)   Pulse 66   Temp 98.1 F (36.7 C) (Temporal)   Resp 18   Wt 214 lb 12.8 oz (97.4 kg)   SpO2 95%   BMI 31.72 kg/m  Wt Readings from Last 3 Encounters:  12/22/19 214 lb 12.8 oz (97.4 kg)  12/03/19 218 lb (98.9 kg)  06/30/19 216 lb (98 kg)     There are no preventive care reminders to display for this patient.  There are no preventive care reminders to display for this patient.  Lab Results  Component Value Date   TSH 2.56 09/04/2018   Lab Results  Component Value Date   WBC 5.5 12/03/2019   HGB 13.3 12/03/2019   HCT 38.0 12/03/2019   MCV 91 12/03/2019   PLT 186 12/03/2019   Lab Results  Component Value Date   NA 142 12/03/2019   K 4.5 12/03/2019   CHLORIDE 108 12/26/2015   CO2 20 12/03/2019   GLUCOSE 150 (H) 12/03/2019   BUN 26 12/03/2019   CREATININE 1.84 (H) 12/03/2019   BILITOT 0.3 12/03/2019   ALKPHOS 126 (H) 12/03/2019   AST 20 12/03/2019   ALT 17 12/03/2019   PROT 6.7 12/03/2019    ALBUMIN 4.3 12/03/2019   CALCIUM 8.8 12/03/2019   ANIONGAP 8 05/11/2018   EGFR 28 (L) 12/26/2015   Lab Results  Component Value Date   CHOL 110 05/07/2019   Lab Results  Component Value Date   HDL 40 05/07/2019   Lab Results  Component Value Date   LDLCALC 48 05/07/2019   Lab Results  Component Value Date   TRIG 134 05/07/2019   Lab Results  Component Value Date   CHOLHDL 2.8 05/07/2019   Lab Results  Component Value Date   HGBA1C 5.9 (H) 05/07/2019      Assessment & Plan:  To the many small bump on abdomen and left buttock cheek and inner thigh apply Bactroban cream as directed  Take by mouth antibiotic  I and D to decompress and Wound culture upper and lower thigh abscess. Cleaned with betadine/alcohol, numbed with 1% lidocaine, no packing needed, applied pressure gauze dressing with neosporin.   Pt tolerated procedure well  Problem List Items Addressed This Visit    None    Visit Diagnoses    Cutaneous abscess, unspecified site    -  Primary   Relevant Orders   WOUND CULTURE   WOUND CULTURE   Bumps on skin  Furuncle of groin       Furuncle of left thigh        I/D completed today to left upper thigh and left lower thigh, keep dressing on 24 hour, then clean twice a day with antibacterial liquid soap applying antibiotic prescription topical   Take oral antibiotic as prescribed as directed  Seek medical attention for worsening or non resolving symptoms  Meds ordered this encounter  Medications  . DISCONTD: mupirocin cream (BACTROBAN) 2 %    Sig: Apply 1 application topically 2 (two) times daily.    Dispense:  15 g    Refill:  0  . DISCONTD: sulfamethoxazole-trimethoprim (BACTRIM DS) 800-160 MG tablet    Sig: Take 1 tablet by mouth 2 (two) times daily for 10 days.    Dispense:  20 tablet    Refill:  0    Follow-up: Return if symptoms worsen or fail to improve.    Annie Main, FNP

## 2019-12-22 NOTE — Patient Instructions (Addendum)
I/D completed today to left upper thigh and left lower thigh, keep dressing on 24 hour, then clean twice a day with antibacterial liquid soap applying antibiotic prescription topical   Take oral antibiotic as prescribed as directed  Seek medical attention for worsening or non resolving symptoms

## 2019-12-24 ENCOUNTER — Other Ambulatory Visit: Payer: Self-pay | Admitting: Family Medicine

## 2019-12-24 ENCOUNTER — Ambulatory Visit (INDEPENDENT_AMBULATORY_CARE_PROVIDER_SITE_OTHER): Payer: Medicare Other | Admitting: Family Medicine

## 2019-12-24 DIAGNOSIS — L0291 Cutaneous abscess, unspecified: Secondary | ICD-10-CM | POA: Diagnosis not present

## 2019-12-24 NOTE — Progress Notes (Signed)
Subjective:    Patient ID: [name], male    DOB: Jul 23, 1945, 74 y.o.   MRN: [id number]  HPI Patient was recently seen by my provider and had an abscess lanced on his anterior left thigh.  He started Bactrim however the abscess is returning and is now tense swollen and painful.  The patch of erythema on his anterior left thigh is approximately 5 cm in diameter.  It is indurated firm and swollen.  In the center there is a 1 cm area that is friable with pus oozing out of a central pore with firm pressure.  This area is painful. Past Medical History:  Diagnosis Date  . Allergy   . Bacteremia due to Gram-negative bacteria 04/13/2015  . Bladder rupture 2010  . Bladder tumor   . Cancer ([place])    bladder  . Chronic kidney disease   . Depression   . Gout   . 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 Hyperlipidemia   . Restless leg syndrome, controlled    Past Surgical History:  Procedure Laterality Date  . APPENDECTOMY    . BLADDER SURGERY  2010  . COLONOSCOPY  06/05/2009  . CYSTOSCOPY W/ RETROGRADES Bilateral 04/21/2013   Procedure: CYSTOSCOPY WITH RETROGRADE PYELOGRAM;  Surgeon: Molli Hazard, MD;  Location: [facility];  Service: Urology;  Laterality: Bilateral;  . FRACTURE SURGERY Right 01/05/2005   lower leg/ankle  . NASAL SEPTUM SURGERY    . TONSILLECTOMY AND ADENOIDECTOMY    . TRANSURETHRAL RESECTION OF BLADDER TUMOR WITH GYRUS (TURBT-GYRUS) N/A 04/21/2013   Procedure: TRANSURETHRAL RESECTION OF BLADDER TUMOR WITH GYRUS (TURBT-GYRUS), cystoscopy;  Surgeon: Molli Hazard, MD;  Location: [facility];  Service: Urology;  Laterality: N/A;   Current Outpatient Medications on File Prior to Visit  Medication Sig Dispense Refill  . acetaminophen (TYLENOL) 500 MG tablet Take 1 tablet (500 mg total) by mouth every 6 (six) hours as needed for mild pain, moderate pain or headache. 30 tablet 0  . allopurinol (ZYLOPRIM) 300 MG tablet Take 1 tablet (300 mg total) by mouth  daily. 90 tablet 3  . aspirin EC 81 MG tablet Take 81 mg by mouth at bedtime.    . Cholecalciferol (VITAMIN D3 PO) Take by mouth.    . clonazePAM (KLONOPIN) 0.5 MG tablet Take 0.5 mg by mouth 2 (two) times daily as needed.    . colchicine 0.6 MG tablet Take 0.6-1.2 mg by mouth 2 (two) times daily as needed (for gout flare).     [name] escitalopram (LEXAPRO) 20 MG tablet Take 2 tablets (40 mg total) by mouth at bedtime. 180 tablet 3  . ezetimibe (ZETIA) 10 MG tablet Take 10 mg by mouth daily.    . fluticasone (FLONASE) 50 MCG/ACT nasal spray USE 2 SPRAYS IN EACH  NOSTRIL DAILY 48 g 3  . gabapentin (NEURONTIN) 300 MG capsule TAKE 1 CAPSULE BY MOUTH 3  TIMES DAILY AS NEEDED 270 capsule 3  . mupirocin cream (BACTROBAN) 2 % Apply 1 application topically 2 (two) times daily. 15 g 0  . Omega-3 Fatty Acids (FISH OIL PO) Take by mouth.    [name] omeprazole (PRILOSEC) 20 MG capsule TAKE 1 CAPSULE BY MOUTH  DAILY 90 capsule 3  . rOPINIRole (REQUIP) 4 MG tablet Take 4 mg by mouth at bedtime.     . rosuvastatin (CRESTOR) 5 MG tablet TAKE 1 TABLET BY MOUTH  DAILY 90 tablet 3  . sulfamethoxazole-trimethoprim (BACTRIM DS) 800-160 MG tablet  Take 1 tablet by mouth 2 (two) times daily for 10 days. 20 tablet 0  . traZODone (DESYREL) 100 MG tablet Take 50 mg by mouth daily as needed for sleep.     . vitamin B-12 (CYANOCOBALAMIN) 1000 MCG tablet Take 1,000 mcg by mouth daily.     No current facility-administered medications on file prior to visit.   Allergies  Allergen Reactions  . Augmentin [Amoxicillin-Pot Clavulanate] Nausea And Vomiting and Other (See Comments)    Has patient had a PCN reaction causing immediate rash, facial/tongue/throat swelling, SOB or lightheadedness with hypotension: No Has patient had a PCN reaction causing severe rash involving mucus membranes or skin necrosis: No Has patient had a PCN reaction that required hospitalization No Has patient had a PCN reaction occurring within the last 10 years:  No If all of the above answers are "NO", then may proceed with Cephalosporin use.  . Statins Other (See Comments)    Reaction:  Muscle pain    Social History   Socioeconomic History  . Marital status: Married    Spouse name: Not on file  . Number of children: Not on file  . Years of education: Not on file  . Highest education level: Not on file  Occupational History  . Not on file  Tobacco Use  . Smoking status: Former Smoker    Packs/day: 0.50    Years: 30.00    Pack years: 15.00    Types: Cigarettes    Quit date: 07/24/1989    Years since quitting: 30.4  . Smokeless tobacco: Never Used  Vaping Use  . Vaping Use: Never used  Substance and Sexual Activity  . Alcohol use: Yes    Comment: social  . Drug use: No  . Sexual activity: Not on file  Other Topics Concern  . 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 Difficulty of Paying Living Expenses:   Food Insecurity:   . Worried About Charity fundraiser in the Last Year:   . Arboriculturist in the Last Year:   Transportation Needs:   . Film/video editor (Medical):   [name] Lack of Transportation (Non-Medical):   Physical Activity:   . Days of Exercise per Week:   . Minutes of Exercise per Session:   Stress:   . Feeling of Stress :   Social Connections:   . Frequency of Communication with Friends and Family:   . Frequency of Social Gatherings with Friends and Family:   . Attends Religious Services:   . Active Member of Clubs or Organizations:   . Attends Archivist Meetings:   [name] Marital Status:   Intimate Partner Violence:   . Fear of Current or Ex-Partner:   . Emotionally Abused:   [name] Physically Abused:   . Sexually Abused:    Family History  Problem Relation Age of Onset  . Cancer Mother        ovarian  . Cirrhosis Father   . Alcohol abuse Father   . Colon cancer Neg Hx   . Colon  polyps Neg Hx   . Esophageal cancer Neg Hx   . Rectal cancer Neg Hx   . Stomach cancer Neg Hx      Review of Systems  All other systems reviewed and are negative.      Objective:  Physical Exam Vitals reviewed.  Constitutional:      General: He is not in acute distress.    Appearance: He is well-developed. He is not diaphoretic.  HENT:     Head: Normocephalic and atraumatic.     Right Ear: External ear normal.     Left Ear: External ear normal.     Nose: Nose normal.     Mouth/Throat:     Pharynx: No oropharyngeal exudate.  Eyes:     General: No scleral icterus.       Right eye: No discharge.        Left eye: No discharge.     Conjunctiva/sclera: Conjunctivae normal.     Pupils: Pupils are equal, round, and reactive to light.  Neck:     Thyroid: No thyromegaly.     Vascular: No JVD.     Trachea: No tracheal deviation.  Cardiovascular:     Rate and Rhythm: Normal rate and regular rhythm.     Heart sounds: Normal heart sounds. No murmur heard.  No friction rub. No gallop.   Pulmonary:     Effort: Pulmonary effort is normal. No respiratory distress.     Breath sounds: Normal breath sounds. No stridor. No wheezing or rales.  Chest:     Chest wall: No tenderness.  Abdominal:     General: Bowel sounds are normal. There is no distension.     Palpations: Abdomen is soft. There is no mass.     Tenderness: There is no abdominal tenderness. There is no guarding or rebound.  Musculoskeletal:        General: Normal range of motion.     Cervical back: Normal range of motion and neck supple.     Left upper leg: Swelling, deformity and tenderness present.       Legs:  Lymphadenopathy:     Cervical: No cervical adenopathy.  Skin:    General: Skin is warm.     Coloration: Skin is not pale.     Findings: Erythema present. No rash.  Neurological:     Mental Status: He is alert and oriented to person, place, and time.     Cranial Nerves: No cranial nerve deficit.     Motor:  No abnormal muscle tone.     Coordination: Coordination normal.     Deep Tendon Reflexes: Reflexes are normal and symmetric.  Psychiatric:        Behavior: Behavior normal.        Thought Content: Thought content normal.        Judgment: Judgment normal.           Assessment & Plan:  Cutaneous abscess, unspecified site - Plan: WOUND CULTURE  Skin was anesthetized with 0.1% lidocaine with epinephrine.  A 1 cm vertical incision was made in the center of the abscess.  The wound was opened with a pair hemostats.  It was probed thoroughly with hemostats.  Purulent material was expressed and a culture was sent.  The wound was then probed and cleaned with Q-tips soaked in hydrogen peroxide.  The wound was then packed with 6 inches of quarter inch iodoform gauze.  Wound care was discussed.  Continue antibiotics and recheck Monday or sooner if worsening

## 2019-12-25 LAB — WOUND CULTURE
MICRO NUMBER:: 10598469
MICRO NUMBER:: 10598475
SPECIMEN QUALITY:: ADEQUATE
SPECIMEN QUALITY:: ADEQUATE

## 2019-12-27 ENCOUNTER — Encounter: Payer: Self-pay | Admitting: Nurse Practitioner

## 2019-12-27 DIAGNOSIS — Z22322 Carrier or suspected carrier of Methicillin resistant Staphylococcus aureus: Secondary | ICD-10-CM | POA: Insufficient documentation

## 2019-12-27 LAB — WOUND CULTURE
MICRO NUMBER:: 10608205
SPECIMEN QUALITY:: ADEQUATE

## 2019-12-27 NOTE — Progress Notes (Signed)
Continue treatment plan. 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

## 2019-12-29 ENCOUNTER — Ambulatory Visit: Payer: Medicare Other | Admitting: Neurology

## 2019-12-30 ENCOUNTER — Encounter (INDEPENDENT_AMBULATORY_CARE_PROVIDER_SITE_OTHER): Payer: Medicare Other | Admitting: Diagnostic Neuroimaging

## 2019-12-30 ENCOUNTER — Ambulatory Visit (INDEPENDENT_AMBULATORY_CARE_PROVIDER_SITE_OTHER): Payer: Medicare Other | Admitting: Diagnostic Neuroimaging

## 2019-12-30 ENCOUNTER — Other Ambulatory Visit: Payer: Self-pay

## 2019-12-30 DIAGNOSIS — Z79899 Other long term (current) drug therapy: Secondary | ICD-10-CM

## 2019-12-30 DIAGNOSIS — G629 Polyneuropathy, unspecified: Secondary | ICD-10-CM

## 2019-12-30 DIAGNOSIS — Z0289 Encounter for other administrative examinations: Secondary | ICD-10-CM

## 2019-12-30 DIAGNOSIS — R6889 Other general symptoms and signs: Secondary | ICD-10-CM

## 2019-12-30 NOTE — Procedures (Signed)
[facility]  NCS (NERVE CONDUCTION STUDY) WITH EMG (ELECTROMYOGRAPHY) REPORT   STUDY DATE: 12/30/19 PATIENT NAME: [name] DOB: 09-29-1945 MRN: [id number]  ORDERING CLINICIAN: Andrey Spearman, MD   TECHNOLOGIST: Sherre Scarlet ELECTROMYOGRAPHER: Earlean Polka. Maija Biggers, MD  CLINICAL INFORMATION: 74 year old male with foot / toe numbness.  FINDINGS: NERVE CONDUCTION STUDY:  Bilateral peroneal and tibial motor responses are normal.  Bilateral sural sensory responses are normal.  Bilateral superficial peroneal sensory responses have normal peak latencies and slightly decreased amplitudes.  Bilateral tibial F wave latencies are slightly prolonged.   NEEDLE ELECTROMYOGRAPHY:  Needle examination of right lower extremity vastus medialis, tibialis anterior, gastrocnemius is normal.   IMPRESSION:   Mildly abnormal study demonstrating: - Very mild axonal sensory-motor polyneuropathy.    INTERPRETING PHYSICIAN:  Penni Bombard, MD Certified in Neurology, Neurophysiology and Neuroimaging  [facility] [address], [place], [place] [address] [phone]   [facility]    Nerve / Sites Muscle Latency Ref. Amplitude Ref. Rel Amp Segments Distance Velocity Ref. Area    ms ms mV mV %  cm m/s m/s mVms  R Peroneal - EDB     Ankle EDB 4.7 ?6.5 6.7 ?2.0 100 Ankle - EDB 9   21.6     Fib head EDB 11.3  5.7  84.6 Fib head - Ankle 29 44 ?44 19.1     Pop fossa EDB 13.6  6.0  106 Pop fossa - Fib head 10 44 ?44 20.5         Pop fossa - Ankle      L Peroneal - EDB     Ankle EDB 4.9 ?6.5 4.1 ?2.0 100 Ankle - EDB 9   12.2     Fib head EDB 11.5  3.5  85.5 Fib head - Ankle 29 44 ?44 11.4     Pop fossa EDB 13.8  3.4  95.9 Pop fossa - Fib head 10 44 ?44 12.3         Pop fossa - Ankle      R Tibial - AH     Ankle AH 4.4 ?5.8 8.1 ?4.0 100 Ankle - AH 9   24.7     Pop fossa AH 14.0  2.6  31.7 Pop fossa - Ankle 39 41 ?41 13.7  L Tibial - AH     Ankle  AH 4.1 ?5.8 10.0 ?4.0 100 Ankle - AH 9   29.6     Pop fossa AH 13.6  4.3  42.8 Pop fossa - Ankle 39 41 ?41 17.2             SNC    Nerve / Sites Rec. Site Peak Lat Ref.  Amp Ref. Segments Distance    ms ms V V  cm  R Sural - Ankle (Calf)     Calf Ankle 3.8 ?4.4 6 ?6 Calf - Ankle 14  L Sural - Ankle (Calf)     Calf Ankle 4.3 ?4.4 6 ?6 Calf - Ankle 14  R Superficial peroneal - Ankle     Lat leg Ankle 4.4 ?4.4 5 ?6 Lat leg - Ankle 14  L Superficial peroneal - Ankle     Lat leg Ankle 4.1 ?4.4 5 ?6 Lat leg - Ankle 14             F  Wave    Nerve F Lat Ref.   ms ms  R Tibial - AH 60.8 ?56.0  L  Tibial - AH 60.2 ?56.0         EMG Summary Table    Spontaneous MUAP Recruitment  Muscle IA Fib PSW Fasc Other Amp Dur. Poly Pattern  R. Vastus medialis Normal None None None _______ Normal Normal Normal Normal  R. Tibialis anterior Normal None None None _______ Normal Normal Normal Normal  R. Gastrocnemius (Medial head) Normal None None None _______ Normal Normal Normal Normal

## 2020-01-03 ENCOUNTER — Ambulatory Visit (INDEPENDENT_AMBULATORY_CARE_PROVIDER_SITE_OTHER): Payer: Medicare Other | Admitting: Nurse Practitioner

## 2020-01-03 ENCOUNTER — Other Ambulatory Visit: Payer: Self-pay

## 2020-01-03 VITALS — BP 118/62 | HR 64 | Temp 97.6°F | Resp 18 | Wt 214.0 lb

## 2020-01-03 DIAGNOSIS — R7309 Other abnormal glucose: Secondary | ICD-10-CM

## 2020-01-03 DIAGNOSIS — L0291 Cutaneous abscess, unspecified: Secondary | ICD-10-CM

## 2020-01-03 NOTE — Progress Notes (Signed)
New Patient Office Visit  Subjective:  Patient ID: SERGIO ZAWISLAK, male    DOB: [birth year]  Age: 74 y.o. MRN: [id number]  CC:  Chief Complaint  Patient presents with  . Cyst    f/u, wants 2 more cysts looked at, R bottom of buttocks and L groin area    HPI BOE DEANS  Is a 74 year old male presenting for a subsequent third visit for a cutaneous abscess that has been lanced to the anterior left thigh. He initially was started on PO bactrim for 10 days. Wound cultures have returned MRSA positive.  He continues to use the topical Bactriban with improvement to all areas and has completed the po antibiotic. To the abdominal, inner left thigh and right buttock area the multiple bumps are near healed. To the left leg the pt removed the packing and has healed near closed, has minimal amount of discharge of serous drainage, there is no foul odor, with mild erythema, minimal edema. No new areas. No fever or chills.   Past Medical History:  Diagnosis Date  . Allergy   . Bacteremia due to Gram-negative bacteria 04/13/2015  . Bladder rupture 2010  . Bladder tumor   . Cancer ([place])    bladder  . Chronic kidney disease   . Depression   . Gout   . 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 Hyperlipidemia   . Restless leg syndrome, controlled     Past Surgical History:  Procedure Laterality Date  . APPENDECTOMY    . BLADDER SURGERY  2010  . COLONOSCOPY  06/05/2009  . CYSTOSCOPY W/ RETROGRADES Bilateral 04/21/2013   Procedure: CYSTOSCOPY WITH RETROGRADE PYELOGRAM;  Surgeon: Molli Hazard, MD;  Location: [facility];  Service: Urology;  Laterality: Bilateral;  . FRACTURE SURGERY Right 01/05/2005   lower leg/ankle  . NASAL SEPTUM SURGERY    . TONSILLECTOMY AND ADENOIDECTOMY    . TRANSURETHRAL RESECTION OF BLADDER TUMOR WITH GYRUS (TURBT-GYRUS) N/A 04/21/2013   Procedure: TRANSURETHRAL RESECTION OF BLADDER TUMOR WITH GYRUS (TURBT-GYRUS), cystoscopy;  Surgeon: Molli Hazard,  MD;  Location: [facility];  Service: Urology;  Laterality: N/A;    Family History  Problem Relation Age of Onset  . Cancer Mother        ovarian  . Cirrhosis Father   . Alcohol abuse Father   . Colon cancer Neg Hx   . Colon polyps Neg Hx   . Esophageal cancer Neg Hx   . Rectal cancer Neg Hx   . Stomach cancer Neg Hx     Social History   Socioeconomic History  . Marital status: Married    Spouse name: Not on file  . Number of children: Not on file  . Years of education: Not on file  . Highest education level: Not on file  Occupational History  . Not on file  Tobacco Use  . Smoking status: Former Smoker    Packs/day: 0.50    Years: 30.00    Pack years: 15.00    Types: Cigarettes    Quit date: 07/24/1989    Years since quitting: 30.4  . Smokeless tobacco: Never Used  Vaping Use  . Vaping Use: Never used  Substance and Sexual Activity  . Alcohol use: Yes    Comment: social  . Drug use: No  . Sexual activity: Not on file  Other Topics Concern  . 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 Difficulty of Paying Living Expenses:   Food Insecurity:   . Worried About Charity fundraiser in the Last Year:   . Arboriculturist in the Last Year:   Transportation Needs:   . Film/video editor (Medical):   [name] Lack of Transportation (Non-Medical):   Physical Activity:   . Days of Exercise per Week:   . Minutes of Exercise per Session:   Stress:   . Feeling of Stress :   Social Connections:   . Frequency of Communication with Friends and Family:   . Frequency of Social Gatherings with Friends and Family:   . Attends Religious Services:   . Active Member of Clubs or Organizations:   . Attends Archivist Meetings:   [name] Marital Status:   Intimate Partner Violence:   . Fear of Current or Ex-Partner:   . Emotionally Abused:   [name] Physically Abused:   .  Sexually Abused:     ROS Review of Systems  All other systems reviewed and are negative.   Objective:   Today's Vitals: BP 118/62 (BP Location: Left Arm, Patient Position: Sitting, Cuff Size: Large)   Pulse 64   Temp 97.6 F (36.4 C) (Temporal)   Resp 18   Wt 214 lb (97.1 kg)   SpO2 94%   BMI 31.60 kg/m   Physical Exam Vitals reviewed.  Constitutional:      Appearance: Normal appearance.  HENT:     Head: Normocephalic.     Right Ear: External ear normal.     Left Ear: External ear normal.  Eyes:     Extraocular Movements: Extraocular movements intact.     Conjunctiva/sclera: Conjunctivae normal.     Pupils: Pupils are equal, round, and reactive to light.  Cardiovascular:     Rate and Rhythm: Normal rate.  Pulmonary:     Effort: Pulmonary effort is normal.  Musculoskeletal:        General: Normal range of motion.  Skin:    General: Skin is warm.     Coloration: Skin is not jaundiced or pale.     Findings: No bruising or erythema.       Neurological:     General: No focal deficit present.     Mental Status: He is alert and oriented to person, place, and time.  Psychiatric:        Mood and Affect: Mood normal.        Behavior: Behavior normal.        Thought Content: Thought content normal.        Judgment: Judgment normal.     Assessment & Plan:   Problem List Items Addressed This Visit    None    Visit Diagnoses    Cutaneous abscess, unspecified site    -  Primary   Relevant Medications   sulfamethoxazole-trimethoprim (BACTRIM DS) 800-160 MG tablet   Elevated glucose       Relevant Orders   Hemoglobin A1c (Completed)    Will complete 5 additional days of po Bactrim antibiotic for the left leg that has not completely healed and pt will use liquid antibacterial soap three times a day to clean to area leaving the soap 20-40 seconds then rinsing with warms water, then patting dry. Then applying topical prescribed antibiotic, then applying gauze covering.  He will follow up for increase or non resolving sxs asap.  H/O elevated glucose will obtain A1C today  Outpatient Encounter Medications as of 01/03/2020  Medication Sig  . acetaminophen (TYLENOL) 500 MG tablet Take 1 tablet (500 mg total) by mouth every 6 (six) hours as needed for mild pain, moderate pain or headache.  . allopurinol (ZYLOPRIM) 300 MG tablet Take 1 tablet (300 mg total) by mouth daily.  [name] aspirin EC 81 MG tablet Take 81 mg by mouth at bedtime.  . Cholecalciferol (VITAMIN D3 PO) Take by mouth.  . clonazePAM (KLONOPIN) 0.5 MG tablet Take 0.5 mg by mouth 2 (two) times daily as needed.  . colchicine 0.6 MG tablet Take 0.6-1.2 mg by mouth 2 (two) times daily as needed (for gout flare).   [name] escitalopram (LEXAPRO) 20 MG tablet Take 2 tablets (40 mg total) by mouth at bedtime.  [name] ezetimibe (ZETIA) 10 MG tablet TAKE 1 TABLET BY MOUTH IN  THE MORNING  . fluticasone (FLONASE) 50 MCG/ACT nasal spray USE 2 SPRAYS IN BOTH  NOSTRILS DAILY  . gabapentin (NEURONTIN) 300 MG capsule TAKE 1 CAPSULE BY MOUTH 3  TIMES DAILY AS NEEDED  . mupirocin cream (BACTROBAN) 2 % Apply 1 application topically 2 (two) times daily.  . Omega-3 Fatty Acids (FISH OIL PO) Take by mouth.  [name] omeprazole (PRILOSEC) 20 MG capsule TAKE 1 CAPSULE BY MOUTH  DAILY  . rOPINIRole (REQUIP) 4 MG tablet Take 4 mg by mouth at bedtime.   . rosuvastatin (CRESTOR) 5 MG tablet TAKE 1 TABLET BY MOUTH  DAILY  . traZODone (DESYREL) 100 MG tablet Take 50 mg by mouth daily as needed for sleep.   . vitamin B-12 (CYANOCOBALAMIN) 1000 MCG tablet Take 1,000 mcg by mouth daily.  [name] sulfamethoxazole-trimethoprim (BACTRIM DS) 800-160 MG tablet Take 1 tablet by mouth 2 (two) times daily for 5 days.   No facility-administered encounter medications on file as of 01/03/2020.    Follow-up: Return if symptoms worsen or fail to improve.   Annie Main, FNP

## 2020-01-04 ENCOUNTER — Telehealth: Payer: Self-pay | Admitting: Family Medicine

## 2020-01-04 LAB — HEMOGLOBIN A1C
Hgb A1c MFr Bld: 6.1 % of total Hgb — ABNORMAL HIGH (ref <5.7)
Mean Plasma Glucose: 128 (calc)
eAG (mmol/L): 7.1 (calc)

## 2020-01-04 MED ORDER — SULFAMETHOXAZOLE-TRIMETHOPRIM 800-160 MG PO TABS: 1.0000 | ORAL_TABLET | Freq: Two times a day (BID) | ORAL | 0 refills | Status: DC

## 2020-01-04 NOTE — Telephone Encounter (Signed)
CB# [phone] Pt seen NP call the pharmacy haven't received fax on his Bactrim would like to know if its going to be call in the pharmacy today

## 2020-01-04 NOTE — Patient Instructions (Signed)
Will complete 5 additional days of po Bactrim antibiotic for the left leg that has not completely healed and pt will use liquid antibacterial soap three times a day to clean to area leaving the soap 20-40 seconds then rinsing with warms water, then patting dry. Then applying topical prescribed antibiotic, then applying gauze covering. He will follow up for increase or non resolving sxs asap.

## 2020-01-05 ENCOUNTER — Other Ambulatory Visit: Payer: Self-pay

## 2020-01-05 DIAGNOSIS — L0291 Cutaneous abscess, unspecified: Secondary | ICD-10-CM

## 2020-01-05 MED ORDER — SULFAMETHOXAZOLE-TRIMETHOPRIM 800-160 MG PO TABS: 1.0000 | ORAL_TABLET | Freq: Two times a day (BID) | ORAL | 0 refills | Status: AC

## 2020-02-01 ENCOUNTER — Encounter: Payer: Self-pay | Admitting: Family Medicine

## 2020-02-19 ENCOUNTER — Other Ambulatory Visit: Payer: Self-pay | Admitting: Family Medicine

## 2020-02-19 DIAGNOSIS — L0390 Cellulitis, unspecified: Secondary | ICD-10-CM | POA: Diagnosis not present

## 2020-03-07 DIAGNOSIS — Z436 Encounter for attention to other artificial openings of urinary tract: Secondary | ICD-10-CM | POA: Diagnosis not present

## 2020-03-07 DIAGNOSIS — K435 Parastomal hernia without obstruction or  gangrene: Secondary | ICD-10-CM | POA: Diagnosis not present

## 2020-03-29 DIAGNOSIS — F411 Generalized anxiety disorder: Secondary | ICD-10-CM | POA: Diagnosis not present

## 2020-03-29 DIAGNOSIS — F331 Major depressive disorder, recurrent, moderate: Secondary | ICD-10-CM | POA: Diagnosis not present

## 2020-03-29 DIAGNOSIS — Z634 Disappearance and death of family member: Secondary | ICD-10-CM | POA: Diagnosis not present

## 2020-04-08 ENCOUNTER — Emergency Department (HOSPITAL_COMMUNITY)
Admission: EM | Admit: 2020-04-08 | Discharge: 2020-04-08 | Disposition: A | Discharge status: Home / Self Care | Payer: Medicare Other | Attending: Emergency Medicine | Admitting: Emergency Medicine

## 2020-04-08 ENCOUNTER — Emergency Department (HOSPITAL_COMMUNITY): Payer: Medicare Other

## 2020-04-08 ENCOUNTER — Encounter (HOSPITAL_COMMUNITY): Payer: Self-pay | Admitting: *Deleted

## 2020-04-08 ENCOUNTER — Other Ambulatory Visit: Payer: Self-pay

## 2020-04-08 DIAGNOSIS — Y93K1 Activity, walking an animal: Secondary | ICD-10-CM | POA: Diagnosis not present

## 2020-04-08 DIAGNOSIS — S46911A Strain of unspecified muscle, fascia and tendon at shoulder and upper arm level, right arm, initial encounter: Secondary | ICD-10-CM | POA: Diagnosis not present

## 2020-04-08 DIAGNOSIS — Z7982 Long term (current) use of aspirin: Secondary | ICD-10-CM | POA: Insufficient documentation

## 2020-04-08 DIAGNOSIS — W010XXA Fall on same level from slipping, tripping and stumbling without subsequent striking against object, initial encounter: Secondary | ICD-10-CM | POA: Diagnosis not present

## 2020-04-08 DIAGNOSIS — M25511 Pain in right shoulder: Secondary | ICD-10-CM | POA: Diagnosis not present

## 2020-04-08 DIAGNOSIS — Z79899 Other long term (current) drug therapy: Secondary | ICD-10-CM | POA: Diagnosis not present

## 2020-04-08 DIAGNOSIS — N183 Chronic kidney disease, stage 3 unspecified: Secondary | ICD-10-CM | POA: Insufficient documentation

## 2020-04-08 DIAGNOSIS — Z87891 Personal history of nicotine dependence: Secondary | ICD-10-CM | POA: Insufficient documentation

## 2020-04-08 DIAGNOSIS — Z8551 Personal history of malignant neoplasm of bladder: Secondary | ICD-10-CM | POA: Diagnosis not present

## 2020-04-08 DIAGNOSIS — S46011A Strain of muscle(s) and tendon(s) of the rotator cuff of right shoulder, initial encounter: Secondary | ICD-10-CM | POA: Diagnosis not present

## 2020-04-08 DIAGNOSIS — S4991XA Unspecified injury of right shoulder and upper arm, initial encounter: Secondary | ICD-10-CM | POA: Diagnosis present

## 2020-04-08 MED ORDER — MELOXICAM 7.5 MG PO TABS
7.5000 mg | ORAL_TABLET | Freq: Two times a day (BID) | ORAL | 0 refills | Status: AC | PRN
Start: 2020-04-08 — End: 2020-04-22

## 2020-04-08 NOTE — ED Triage Notes (Signed)
Pt fell last night while walking his dog, landing on right side on the pavement.  C/o right shoulder pain, denies hitting head and denies taking blood thinners.

## 2020-04-08 NOTE — ED Provider Notes (Signed)
[facility] EMERGENCY DEPARTMENT Provider Note   CSN: [id number] Arrival date & time: 04/08/20  1812     History Chief Complaint  Patient presents with  . Fall    [name] is a 74 y.o. male.  HPI   This patient is a pleasant 74 year old male who states that he had an accidental fall last night onto cement onto the posterior lateral right shoulder.  This occurred when his dog tried to run across the road, he got jerked off balance and tripped over a raised edge on a driveway.  There was no head injury, he is continued to have some pain in the right shoulder especially with range of motion, no numbness or weakness in the hand, symptoms are mild to moderate, persistent and worse with range of motion.  Past Medical History:  Diagnosis Date  . Allergy   . Bacteremia due to Gram-negative bacteria 04/13/2015  . Bladder rupture 2010  . Bladder tumor   . Cancer ([place])    bladder  . Chronic kidney disease   . Depression   . Gout   . 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 Hyperlipidemia   . Restless leg syndrome, controlled     Patient Active Problem List   Diagnosis Date Noted  . MRSA (methicillin resistant staph aureus) culture positive 12/27/2019  . UTI (urinary tract infection) 08/24/2016  . Fever 08/24/2016  . History of nephrectomy 08/24/2016  . CKD (chronic kidney disease) stage 3, GFR 30-59 ml/min (HCC) 08/24/2016  . Flu-like symptoms 08/24/2016  . Sepsis due to Escherichia coli (E. coli) ([place]) 04/14/2015  . Bacteremia due to Gram-negative bacteria ([place]), E. Coli 04/14/2015  . UTI (lower urinary tract infection) 04/13/2015  . Acute renal failure superimposed on stage 3 chronic kidney disease ([place]) 04/13/2015  . Anemia of chronic kidney failure 04/13/2015  . Acute hyponatremia 04/13/2015  . Bladder cancer ([place]) 06/23/2013  . Hyperlipidemia   . GERD (gastroesophageal reflux disease) 12/22/2012    Past Surgical History:  Procedure Laterality Date  .  APPENDECTOMY    . BLADDER SURGERY  2010  . COLONOSCOPY  06/05/2009  . CYSTOSCOPY W/ RETROGRADES Bilateral 04/21/2013   Procedure: CYSTOSCOPY WITH RETROGRADE PYELOGRAM;  Surgeon: Molli Hazard, MD;  Location: [facility];  Service: Urology;  Laterality: Bilateral;  . FRACTURE SURGERY Right 01/05/2005   lower leg/ankle  . NASAL SEPTUM SURGERY    . TONSILLECTOMY AND ADENOIDECTOMY    . TRANSURETHRAL RESECTION OF BLADDER TUMOR WITH GYRUS (TURBT-GYRUS) N/A 04/21/2013   Procedure: TRANSURETHRAL RESECTION OF BLADDER TUMOR WITH GYRUS (TURBT-GYRUS), cystoscopy;  Surgeon: Molli Hazard, MD;  Location: [facility];  Service: Urology;  Laterality: N/A;       Family History  Problem Relation Age of Onset  . Cancer Mother        ovarian  . Cirrhosis Father   . Alcohol abuse Father   . Colon cancer Neg Hx   . Colon polyps Neg Hx   . Esophageal cancer Neg Hx   . Rectal cancer Neg Hx   . Stomach cancer Neg Hx     Social History   Tobacco Use  . Smoking status: Former Smoker    Packs/day: 0.50    Years: 30.00    Pack years: 15.00    Types: Cigarettes    Quit date: 07/24/1989    Years since quitting: 30.7  . Smokeless tobacco: Never Used  Vaping Use  . Vaping Use: Never used  Substance  Use Topics  . Alcohol use: Yes    Comment: social  . Drug use: No    Home Medications Prior to Admission medications   Medication Sig Start Date End Date Taking? Authorizing Provider  acetaminophen (TYLENOL) 500 MG tablet Take 1 tablet (500 mg total) by mouth every 6 (six) hours as needed for mild pain, moderate pain or headache. 08/26/16   Arrien, Jimmy Picket, MD  allopurinol (ZYLOPRIM) 300 MG tablet TAKE 1 TABLET BY MOUTH  DAILY 02/23/20   [name], MD  aspirin EC 81 MG tablet Take 81 mg by mouth at bedtime.    [provider]  Cholecalciferol (VITAMIN D3 PO) Take by mouth.    [provider]  clonazePAM (KLONOPIN) 0.5 MG tablet Take 0.5 mg by mouth 2 (two) times  daily as needed. 09/17/19   [provider]  colchicine 0.6 MG tablet Take 0.6-1.2 mg by mouth 2 (two) times daily as needed (for gout flare).     [provider]  escitalopram (LEXAPRO) 20 MG tablet Take 2 tablets (40 mg total) by mouth at bedtime. 02/25/17   [name], MD  ezetimibe (ZETIA) 10 MG tablet TAKE 1 TABLET BY MOUTH IN  THE MORNING 12/30/19   [name], MD  fluticasone [facility]) 50 MCG/ACT nasal spray USE 2 SPRAYS IN BOTH  NOSTRILS DAILY 12/30/19   [name], MD  gabapentin (NEURONTIN) 300 MG capsule TAKE 1 CAPSULE BY MOUTH 3  TIMES DAILY AS NEEDED 08/02/19   [name], MD  meloxicam (MOBIC) 7.5 MG tablet Take 1 tablet (7.5 mg total) by mouth 2 (two) times daily as needed for up to 14 days for pain. 04/08/20 04/22/20  Noemi Chapel, MD  mupirocin cream (BACTROBAN) 2 % Apply 1 application topically 2 (two) times daily. 12/22/19   Annie Main, FNP  Omega-3 Fatty Acids (FISH OIL PO) Take by mouth.    [provider]  omeprazole (PRILOSEC) 20 MG capsule TAKE 1 CAPSULE BY MOUTH  DAILY 09/09/19   [name], MD  rOPINIRole (REQUIP) 4 MG tablet Take 4 mg by mouth at bedtime.  05/07/18   [provider]  rosuvastatin (CRESTOR) 5 MG tablet TAKE 1 TABLET BY MOUTH  DAILY 06/17/19   [name], MD  traZODone (DESYREL) 100 MG tablet Take 50 mg by mouth daily as needed for sleep.     [provider]  vitamin B-12 (CYANOCOBALAMIN) 1000 MCG tablet Take 1,000 mcg by mouth daily.    [provider]    Allergies    Augmentin [amoxicillin-pot clavulanate] and Statins  Review of Systems   Review of Systems  Musculoskeletal: Positive for arthralgias.  Neurological: Negative for weakness and numbness.    Physical Exam Updated Vital Signs BP 116/75 (BP Location: Left Arm)   Pulse 72   Temp 98 F (36.7 C) (Oral)   Resp 18   Ht 1.753 m (5\' 9" )   Wt 90.7 kg   SpO2 98%   BMI 29.53 kg/m   Physical  Exam Vitals and nursing note reviewed.  Constitutional:      Appearance: He is well-developed. He is not diaphoretic.  HENT:     Head: Normocephalic and atraumatic.  Eyes:     General:        Right eye: No discharge.        Left eye: No discharge.     Conjunctiva/sclera: Conjunctivae normal.  Cardiovascular:     Comments: Normal  pulses at the right radial artery Pulmonary:     Effort: Pulmonary effort is normal. No respiratory distress.  Musculoskeletal:     Comments: Mild decreased range of motion, tenderness around the [facility] joint, normal range of motion of all other joints of the right upper extremity  Skin:    General: Skin is warm and dry.     Findings: No erythema or rash.  Neurological:     Mental Status: He is alert.     Coordination: Coordination normal.     Comments: Normal strength and sensation of the right upper extremity     ED Results / Procedures / Treatments   Labs (all labs ordered are listed, but only abnormal results are displayed) Labs Reviewed - No data to display  EKG None  Radiology DG Shoulder Right  Result Date: 04/08/2020 CLINICAL DATA:  Pain status post fall EXAM: RIGHT SHOULDER - 2+ VIEW COMPARISON:  None. FINDINGS: There is no evidence of fracture or dislocation. There is no evidence of arthropathy or other focal bone abnormality. Soft tissues are unremarkable. IMPRESSION: Negative. Electronically Signed   By: Constance Holster M.D.   On: 04/08/2020 19:19    Procedures Procedures (including critical care time)  Medications Ordered in ED Medications - No data to display  ED Course  I have reviewed the triage vital signs and the nursing notes.  Pertinent labs & imaging results that were available during my care of the patient were reviewed by me and considered in my medical decision making (see chart for details).    MDM Rules/Calculators/A&P                          The patient has tenderness around his [facility] joint but also around some  of the muscles of the shoulder girdle, given that the x-rays are unremarkable and showed no signs of bony injuries I suspect that there is some type of muscular or ligamentous strain.  Will prescribe an anti-inflammatory, prescribed rest, ice, elevation as needed, will follow up with orthopedics and states he has an established relationship with a local group in [place].  Final Clinical Impression(s) / ED Diagnoses Final diagnoses:  Shoulder strain, right, initial encounter    Rx / DC Orders ED Discharge Orders         Ordered    meloxicam (MOBIC) 7.5 MG tablet  2 times daily PRN        04/08/20 2211           Noemi Chapel, MD 04/08/20 2252

## 2020-04-08 NOTE — ED Notes (Signed)
Signature pad would not let patient sign. Patient given discharge instructions.

## 2020-04-08 NOTE — Discharge Instructions (Signed)
Please follow-up with your orthopedic surgeon at [facility] within the next 2 weeks if this is not getting any better.  If this is a sprain of your rotator cuff or the [facility] joint of your shoulder it may take weeks or sometimes even months to heal.  You may benefit from physical therapy but at this time I would recommend that you take Mobic twice a day, do not take other anti-inflammatories if you are taking this medication.  You are safe to use lidocaine patches or icy hot patches as well.  Cold compresses or ice packs may also be of some benefit.  Return to the emergency department for severe worsening symptoms.

## 2020-04-13 DIAGNOSIS — N5232 Erectile dysfunction following radical cystectomy: Secondary | ICD-10-CM | POA: Diagnosis not present

## 2020-04-13 DIAGNOSIS — K435 Parastomal hernia without obstruction or  gangrene: Secondary | ICD-10-CM | POA: Diagnosis not present

## 2020-04-13 DIAGNOSIS — C678 Malignant neoplasm of overlapping sites of bladder: Secondary | ICD-10-CM | POA: Diagnosis not present

## 2020-04-13 DIAGNOSIS — C61 Malignant neoplasm of prostate: Secondary | ICD-10-CM | POA: Diagnosis not present

## 2020-04-13 DIAGNOSIS — N183 Chronic kidney disease, stage 3 unspecified: Secondary | ICD-10-CM | POA: Diagnosis not present

## 2020-04-27 ENCOUNTER — Other Ambulatory Visit: Payer: Medicare Other

## 2020-04-27 ENCOUNTER — Other Ambulatory Visit: Payer: Self-pay

## 2020-04-27 ENCOUNTER — Ambulatory Visit (INDEPENDENT_AMBULATORY_CARE_PROVIDER_SITE_OTHER): Payer: Medicare Other

## 2020-04-27 ENCOUNTER — Telehealth: Payer: Self-pay

## 2020-04-27 DIAGNOSIS — Z23 Encounter for immunization: Secondary | ICD-10-CM | POA: Diagnosis not present

## 2020-04-27 DIAGNOSIS — N390 Urinary tract infection, site not specified: Secondary | ICD-10-CM

## 2020-04-27 DIAGNOSIS — C679 Malignant neoplasm of bladder, unspecified: Secondary | ICD-10-CM

## 2020-04-27 NOTE — Telephone Encounter (Signed)
sure

## 2020-04-27 NOTE — Telephone Encounter (Signed)
Urine order placed.

## 2020-04-27 NOTE — Telephone Encounter (Signed)
[name] came for urine specimen cup, ok to add urine culture ?

## 2020-04-28 LAB — URINE CULTURE
MICRO NUMBER:: 11101434
SPECIMEN QUALITY:: ADEQUATE

## 2020-05-01 ENCOUNTER — Encounter: Payer: Medicare Other | Admitting: Family Medicine

## 2020-05-03 ENCOUNTER — Other Ambulatory Visit: Payer: Medicare Other

## 2020-05-04 ENCOUNTER — Other Ambulatory Visit: Payer: Self-pay

## 2020-05-04 ENCOUNTER — Ambulatory Visit (INDEPENDENT_AMBULATORY_CARE_PROVIDER_SITE_OTHER): Payer: Medicare Other | Admitting: Family Medicine

## 2020-05-04 VITALS — BP 100/60 | HR 64 | Temp 98.0°F | Ht 69.0 in | Wt 212.0 lb

## 2020-05-04 DIAGNOSIS — Z0001 Encounter for general adult medical examination with abnormal findings: Secondary | ICD-10-CM | POA: Diagnosis not present

## 2020-05-04 DIAGNOSIS — N1831 Chronic kidney disease, stage 3a: Secondary | ICD-10-CM | POA: Diagnosis not present

## 2020-05-04 DIAGNOSIS — Z905 Acquired absence of kidney: Secondary | ICD-10-CM | POA: Diagnosis not present

## 2020-05-04 DIAGNOSIS — Z0000 Encounter for general adult medical examination without abnormal findings: Secondary | ICD-10-CM

## 2020-05-04 DIAGNOSIS — R7303 Prediabetes: Secondary | ICD-10-CM | POA: Diagnosis not present

## 2020-05-04 DIAGNOSIS — I10 Essential (primary) hypertension: Secondary | ICD-10-CM

## 2020-05-04 DIAGNOSIS — Z8551 Personal history of malignant neoplasm of bladder: Secondary | ICD-10-CM | POA: Diagnosis not present

## 2020-05-04 DIAGNOSIS — Z8546 Personal history of malignant neoplasm of prostate: Secondary | ICD-10-CM

## 2020-05-04 NOTE — Progress Notes (Signed)
Subjective:    Patient ID: [name], male    DOB: January 31, 1946, 74 y.o.   MRN: [id number]  HPI Patient is a pleasant 74 y/o WM here today for complete physical exam.  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 His bladder has been completely removed. They also removed his prostate at the same time. They did find a small focus of prostate cancer when he did this and therefore he is due for PSA.  Last colonoscopy documented in his chart was last year (2020).  Overall he is doing very well.    He denies any issues with falls that were not related to vigorous activity, depression, or memory loss.  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  He is currently on B12 replacement and the neuropathy in his feet is doing much better.  He also has prediabetes and his last hemoglobin A1c was 6.1.  He is due to recheck that as well.  Otherwise he is doing well.  He has an appointment upcoming to see his urologist to follow-up for his history of bladder cancer.  We usually check his PSA here. Immunization History  Administered Date(s) Administered  . Fluad Quad(high Dose 65+) 04/29/2019, 04/27/2020  . Influenza Split 05/25/2013  . Influenza,inj,Quad PF,6+ Mos 04/22/2014, 04/28/2015, 05/09/2016, 03/07/2017, 03/03/2018  . Pneumococcal Conjugate-13 04/22/2014  . Pneumococcal Polysaccharide-23 04/28/2015  . Tdap 04/26/2019  . Zoster 01/27/2009   Past Medical History:  Diagnosis Date  . Allergy   . Bacteremia due to Gram-negative bacteria 04/13/2015  . Bladder rupture 2010  . Bladder tumor   . Cancer ([place])    bladder  . Chronic kidney disease   . Depression   . Gout   . 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 Hyperlipidemia   . Restless leg syndrome, controlled    Past Surgical History:  Procedure Laterality Date  . APPENDECTOMY    . BLADDER SURGERY  2010  . COLONOSCOPY  06/05/2009  . CYSTOSCOPY W/ RETROGRADES Bilateral 04/21/2013    Procedure: CYSTOSCOPY WITH RETROGRADE PYELOGRAM;  Surgeon: Molli Hazard, MD;  Location: [facility];  Service: Urology;  Laterality: Bilateral;  . FRACTURE SURGERY Right 01/05/2005   lower leg/ankle  . NASAL SEPTUM SURGERY    . TONSILLECTOMY AND ADENOIDECTOMY    . TRANSURETHRAL RESECTION OF BLADDER TUMOR WITH GYRUS (TURBT-GYRUS) N/A 04/21/2013   Procedure: TRANSURETHRAL RESECTION OF BLADDER TUMOR WITH GYRUS (TURBT-GYRUS), cystoscopy;  Surgeon: Molli Hazard, MD;  Location: [facility];  Service: Urology;  Laterality: N/A;   Current Outpatient Medications on File Prior to Visit  Medication Sig Dispense Refill  . acetaminophen (TYLENOL) 500 MG tablet Take 1 tablet (500 mg total) by mouth every 6 (six) hours as needed for mild pain, moderate pain or headache. 30 tablet 0  . allopurinol (ZYLOPRIM) 300 MG tablet TAKE 1 TABLET BY MOUTH  DAILY 90 tablet 0  . aspirin EC 81 MG tablet Take 81 mg by mouth at bedtime.    . Cholecalciferol (VITAMIN D3 PO) Take by mouth.    . clonazePAM (KLONOPIN) 0.5 MG tablet Take 0.5 mg by mouth 2 (two) times daily as needed.    . colchicine 0.6 MG tablet Take 0.6-1.2 mg by mouth 2 (two) times daily as needed (for gout flare).     [name] escitalopram (LEXAPRO) 20 MG tablet Take 2 tablets (40 mg total) by mouth at bedtime. 180 tablet 3  . ezetimibe (  ZETIA) 10 MG tablet TAKE 1 TABLET BY MOUTH IN  THE MORNING 90 tablet 3  . fluticasone (FLONASE) 50 MCG/ACT nasal spray USE 2 SPRAYS IN BOTH  NOSTRILS DAILY 48 g 3  . gabapentin (NEURONTIN) 300 MG capsule TAKE 1 CAPSULE BY MOUTH 3  TIMES DAILY AS NEEDED 270 capsule 3  . mupirocin cream (BACTROBAN) 2 % Apply 1 application topically 2 (two) times daily. 15 g 0  . omeprazole (PRILOSEC) 20 MG capsule TAKE 1 CAPSULE BY MOUTH  DAILY 90 capsule 3  . rOPINIRole (REQUIP) 4 MG tablet Take 4 mg by mouth at bedtime.     . rosuvastatin (CRESTOR) 5 MG tablet TAKE 1 TABLET BY MOUTH  DAILY 90 tablet 3  . traZODone (DESYREL) 100 MG tablet  Take 50 mg by mouth daily as needed for sleep.     . vitamin B-12 (CYANOCOBALAMIN) 1000 MCG tablet Take 1,000 mcg by mouth daily.    . Omega-3 Fatty Acids (FISH OIL PO) Take by mouth.     No current facility-administered medications on file prior to visit.   Allergies  Allergen Reactions  . Augmentin [Amoxicillin-Pot Clavulanate] Nausea And Vomiting and Other (See Comments)    Has patient had a PCN reaction causing immediate rash, facial/tongue/throat swelling, SOB or lightheadedness with hypotension: No Has patient had a PCN reaction causing severe rash involving mucus membranes or skin necrosis: No Has patient had a PCN reaction that required hospitalization No Has patient had a PCN reaction occurring within the last 10 years: No If all of the above answers are "NO", then may proceed with Cephalosporin use.  . Statins Other (See Comments)    Reaction:  Muscle pain    Social History   Socioeconomic History  . Marital status: Married    Spouse name: Not on file  . Number of children: Not on file  . Years of education: Not on file  . Highest education level: Not on file  Occupational History  . Not on file  Tobacco Use  . Smoking status: Former Smoker    Packs/day: 0.50    Years: 30.00    Pack years: 15.00    Types: Cigarettes    Quit date: 07/24/1989    Years since quitting: 30.8  . Smokeless tobacco: Never Used  Vaping Use  . Vaping Use: Never used  Substance and Sexual Activity  . Alcohol use: Yes    Comment: social  . Drug use: No  . Sexual activity: Not on file  Other Topics Concern  . 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 Difficulty of Paying Living Expenses: Not on file  Food Insecurity:   . Worried About Charity fundraiser in the Last Year: Not on file  . Ran Out of Food in the Last Year: Not on file  Transportation Needs:   . Lack of  Transportation (Medical): Not on file  . Lack of Transportation (Non-Medical): Not on file  Physical Activity:   . Days of Exercise per Week: Not on file  . Minutes of Exercise per Session: Not on file  Stress:   . Feeling of Stress : Not on file  Social Connections:   . Frequency of Communication with Friends and Family: Not on file  . Frequency of Social Gatherings with Friends and Family: Not on file  . Attends  Religious Services: Not on file  . Active Member of Clubs or Organizations: Not on file  . Attends Archivist Meetings: Not on file  . Marital Status: Not on file  Intimate Partner Violence:   . Fear of Current or Ex-Partner: Not on file  . Emotionally Abused: Not on file  . Physically Abused: Not on file  . Sexually Abused: Not on file   Family History  Problem Relation Age of Onset  . Cancer Mother        ovarian  . Cirrhosis Father   . Alcohol abuse Father   . Colon cancer Neg Hx   . Colon polyps Neg Hx   . Esophageal cancer Neg Hx   . Rectal cancer Neg Hx   . Stomach cancer Neg Hx      Review of Systems  All other systems reviewed and are negative.      Objective:   Physical Exam Vitals reviewed.  Constitutional:      General: He is not in acute distress.    Appearance: He is well-developed. He is not diaphoretic.  HENT:     Head: Normocephalic and atraumatic.     Right Ear: External ear normal.     Left Ear: External ear normal.     Nose: Nose normal.     Mouth/Throat:     Pharynx: No oropharyngeal exudate.  Eyes:     General: No scleral icterus.       Right eye: No discharge.        Left eye: No discharge.     Conjunctiva/sclera: Conjunctivae normal.     Pupils: Pupils are equal, round, and reactive to light.  Neck:     Thyroid: No thyromegaly.     Vascular: No JVD.     Trachea: No tracheal deviation.  Cardiovascular:     Rate and Rhythm: Normal rate and regular rhythm.     Heart sounds: Normal heart sounds. No murmur heard.    No friction rub. No gallop.   Pulmonary:     Effort: Pulmonary effort is normal. No respiratory distress.     Breath sounds: Normal breath sounds. No stridor. No wheezing or rales.  Chest:     Chest wall: No tenderness.  Abdominal:     General: Bowel sounds are normal. There is no distension.     Palpations: Abdomen is soft. There is no mass.     Tenderness: There is no abdominal tenderness. There is no guarding or rebound.  Musculoskeletal:        General: No tenderness or deformity. Normal range of motion.     Cervical back: Normal range of motion and neck supple.  Lymphadenopathy:     Cervical: No cervical adenopathy.  Skin:    General: Skin is warm.     Coloration: Skin is not pale.     Findings: No erythema or rash.  Neurological:     Mental Status: He is alert and oriented to person, place, and time.     Cranial Nerves: No cranial nerve deficit.     Motor: No abnormal muscle tone.     Coordination: Coordination normal.     Deep Tendon Reflexes: Reflexes are normal and symmetric.  Psychiatric:        Behavior: Behavior normal.        Thought Content: Thought content normal.        Judgment: Judgment normal.   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  Assessment & Plan:  History of prostate cancer - Plan: PSA  Benign essential HTN - Plan: CBC with Differential/Platelet, COMPLETE METABOLIC PANEL WITH GFR, Lipid panel  Prediabetes - Plan: Hemoglobin A1c  General medical exam  Stage 3a chronic kidney disease ([place])  History of nephrectomy  History of bladder cancer  Patient's immunizations are up-to-date.  Flu shot is up-to-date.  I did recommend a booster on his Covid vaccine at his earliest convenience.  Colonoscopy was performed last year and is up-to-date.  I will check a PSA to monitor for any recurrence of his prostate cancer as he is status post prostatectomy.  He has a history of a radical cystectomy and has plans to follow-up  with his urologist later this month to evaluate for any evidence of recurrence with a CT scan.  He has a history of B12 deficiency but is taking oral B12 supplements and the neuropathy has improved dramatically.  He has a history of prediabetes and I will monitor an A1c today.  He has a history of nephrectomy due to renal cancer, I will monitor his renal function with a CMP given his resultant stage III chronic kidney disease.  He denies any history of falls, depression, or memory loss

## 2020-05-05 LAB — CBC WITH DIFFERENTIAL/PLATELET
Absolute Monocytes: 470 cells/uL (ref 200–950)
Basophils Absolute: 43 cells/uL (ref 0–200)
Basophils Relative: 0.7 %
Eosinophils Absolute: 128 cells/uL (ref 15–500)
Eosinophils Relative: 2.1 %
HCT: 39.8 % (ref 38.5–50.0)
Hemoglobin: 13.1 g/dL — ABNORMAL LOW (ref 13.2–17.1)
Lymphs Abs: 1305 cells/uL (ref 850–3900)
MCH: 30.6 pg (ref 27.0–33.0)
MCHC: 32.9 g/dL (ref 32.0–36.0)
MCV: 93 fL (ref 80.0–100.0)
MPV: 10.3 fL (ref 7.5–12.5)
Monocytes Relative: 7.7 %
Neutro Abs: 4154 cells/uL (ref 1500–7800)
Neutrophils Relative %: 68.1 %
Platelets: 228 10*3/uL (ref 140–400)
RBC: 4.28 10*6/uL (ref 4.20–5.80)
RDW: 13.3 % (ref 11.0–15.0)
Total Lymphocyte: 21.4 %
WBC: 6.1 10*3/uL (ref 3.8–10.8)

## 2020-05-05 LAB — COMPLETE METABOLIC PANEL WITH GFR
AG Ratio: 1.4 (calc) (ref 1.0–2.5)
ALT: 14 U/L (ref 9–46)
AST: 17 U/L (ref 10–35)
Albumin: 4 g/dL (ref 3.6–5.1)
Alkaline phosphatase (APISO): 103 U/L (ref 35–144)
BUN/Creatinine Ratio: 15 (calc) (ref 6–22)
BUN: 28 mg/dL — ABNORMAL HIGH (ref 7–25)
CO2: 24 mmol/L (ref 20–32)
Calcium: 8.9 mg/dL (ref 8.6–10.3)
Chloride: 108 mmol/L (ref 98–110)
Creat: 1.93 mg/dL — ABNORMAL HIGH (ref 0.70–1.18)
GFR, Est African American: 39 mL/min/{1.73_m2} — ABNORMAL LOW (ref >=60)
GFR, Est Non African American: 34 mL/min/{1.73_m2} — ABNORMAL LOW (ref >=60)
Globulin: 2.9 g/dL (calc) (ref 1.9–3.7)
Glucose, Bld: 91 mg/dL (ref 65–99)
Potassium: 5 mmol/L (ref 3.5–5.3)
Sodium: 140 mmol/L (ref 135–146)
Total Bilirubin: 0.4 mg/dL (ref 0.2–1.2)
Total Protein: 6.9 g/dL (ref 6.1–8.1)

## 2020-05-05 LAB — HEMOGLOBIN A1C
Hgb A1c MFr Bld: 6.1 % of total Hgb — ABNORMAL HIGH (ref <5.7)
Mean Plasma Glucose: 128 (calc)
eAG (mmol/L): 7.1 (calc)

## 2020-05-05 LAB — LIPID PANEL
Cholesterol: 100 mg/dL (ref <200)
HDL: 40 mg/dL (ref >=40)
LDL Cholesterol (Calc): 39 mg/dL (calc)
Non-HDL Cholesterol (Calc): 60 mg/dL (calc) (ref <130)
Total CHOL/HDL Ratio: 2.5 (calc) (ref <5.0)
Triglycerides: 125 mg/dL (ref <150)

## 2020-05-05 LAB — PSA: PSA: <0.04 ng/mL (ref <=4.0)

## 2020-05-06 ENCOUNTER — Other Ambulatory Visit: Payer: Self-pay | Admitting: Family Medicine

## 2020-05-10 DIAGNOSIS — Z23 Encounter for immunization: Secondary | ICD-10-CM | POA: Diagnosis not present

## 2020-05-22 ENCOUNTER — Other Ambulatory Visit: Payer: Self-pay | Admitting: Family Medicine

## 2020-05-24 ENCOUNTER — Telehealth: Payer: Self-pay

## 2020-05-24 NOTE — Telephone Encounter (Signed)
Medical records faxed to [facility] for documentation of medical supplies needed for Pt. DME products.

## 2020-06-06 DIAGNOSIS — M19019 Primary osteoarthritis, unspecified shoulder: Secondary | ICD-10-CM | POA: Diagnosis not present

## 2020-06-06 DIAGNOSIS — M25511 Pain in right shoulder: Secondary | ICD-10-CM | POA: Diagnosis not present

## 2020-06-14 ENCOUNTER — Other Ambulatory Visit: Payer: Self-pay | Admitting: Family Medicine

## 2020-06-14 DIAGNOSIS — G609 Hereditary and idiopathic neuropathy, unspecified: Secondary | ICD-10-CM

## 2020-07-10 ENCOUNTER — Other Ambulatory Visit: Payer: Self-pay | Admitting: Family Medicine

## 2020-07-12 ENCOUNTER — Telehealth: Payer: Self-pay | Admitting: Family Medicine

## 2020-07-12 NOTE — Telephone Encounter (Signed)
[name] from [facility] needing an updated prescription for ostomy supplies for insurance, has faxed over information needing signature and return no cb left on msg.

## 2020-07-17 DIAGNOSIS — F331 Major depressive disorder, recurrent, moderate: Secondary | ICD-10-CM | POA: Diagnosis not present

## 2020-07-17 DIAGNOSIS — F411 Generalized anxiety disorder: Secondary | ICD-10-CM | POA: Diagnosis not present

## 2020-07-17 DIAGNOSIS — Z634 Disappearance and death of family member: Secondary | ICD-10-CM | POA: Diagnosis not present

## 2020-07-20 DIAGNOSIS — M25511 Pain in right shoulder: Secondary | ICD-10-CM | POA: Diagnosis not present

## 2020-08-03 DIAGNOSIS — M25511 Pain in right shoulder: Secondary | ICD-10-CM | POA: Diagnosis not present

## 2020-08-11 DIAGNOSIS — M25511 Pain in right shoulder: Secondary | ICD-10-CM | POA: Diagnosis not present

## 2020-10-02 DIAGNOSIS — C61 Malignant neoplasm of prostate: Secondary | ICD-10-CM | POA: Diagnosis not present

## 2020-10-05 ENCOUNTER — Other Ambulatory Visit: Payer: Self-pay

## 2020-10-05 ENCOUNTER — Ambulatory Visit (HOSPITAL_COMMUNITY)
Admission: RE | Admit: 2020-10-05 | Discharge: 2020-10-05 | Disposition: A | Discharge status: Home / Self Care | Payer: Medicare Other | Source: Ambulatory Visit | Attending: Urology | Admitting: Urology

## 2020-10-05 ENCOUNTER — Other Ambulatory Visit (HOSPITAL_COMMUNITY): Payer: Self-pay | Admitting: Urology

## 2020-10-05 DIAGNOSIS — Z905 Acquired absence of kidney: Secondary | ICD-10-CM | POA: Diagnosis not present

## 2020-10-05 DIAGNOSIS — N2889 Other specified disorders of kidney and ureter: Secondary | ICD-10-CM | POA: Diagnosis not present

## 2020-10-05 DIAGNOSIS — C678 Malignant neoplasm of overlapping sites of bladder: Secondary | ICD-10-CM | POA: Insufficient documentation

## 2020-10-05 DIAGNOSIS — C679 Malignant neoplasm of bladder, unspecified: Secondary | ICD-10-CM | POA: Diagnosis not present

## 2020-10-05 DIAGNOSIS — N2882 Megaloureter: Secondary | ICD-10-CM | POA: Diagnosis not present

## 2020-10-11 ENCOUNTER — Ambulatory Visit (HOSPITAL_COMMUNITY): Payer: Medicare Other

## 2020-10-19 DIAGNOSIS — C678 Malignant neoplasm of overlapping sites of bladder: Secondary | ICD-10-CM | POA: Diagnosis not present

## 2020-10-19 DIAGNOSIS — C61 Malignant neoplasm of prostate: Secondary | ICD-10-CM | POA: Diagnosis not present

## 2020-10-19 DIAGNOSIS — N183 Chronic kidney disease, stage 3 unspecified: Secondary | ICD-10-CM | POA: Diagnosis not present

## 2020-10-19 DIAGNOSIS — Z905 Acquired absence of kidney: Secondary | ICD-10-CM | POA: Diagnosis not present

## 2020-12-07 DIAGNOSIS — L821 Other seborrheic keratosis: Secondary | ICD-10-CM | POA: Diagnosis not present

## 2020-12-07 DIAGNOSIS — D2262 Melanocytic nevi of left upper limb, including shoulder: Secondary | ICD-10-CM | POA: Diagnosis not present

## 2020-12-07 DIAGNOSIS — D225 Melanocytic nevi of trunk: Secondary | ICD-10-CM | POA: Diagnosis not present

## 2020-12-07 DIAGNOSIS — D1801 Hemangioma of skin and subcutaneous tissue: Secondary | ICD-10-CM | POA: Diagnosis not present

## 2020-12-07 DIAGNOSIS — D2261 Melanocytic nevi of right upper limb, including shoulder: Secondary | ICD-10-CM | POA: Diagnosis not present

## 2021-01-01 ENCOUNTER — Telehealth: Payer: Self-pay | Admitting: *Deleted

## 2021-01-01 NOTE — Chronic Care Management (AMB) (Signed)
  Chronic Care Management   Note  01/01/2021 Name: DRESDEN LOZITO MRN: [id number] DOB: Feb 13, 1946  KARTER HELLMER is a 75 y.o. year old male who is a primary care patient of [name], [name], MD. I reached out to Leona Carry by phone today in response to a referral sent by Mr. Chirag Krueger Swindle's PCP, Dr. Dennard Schaumann.      Mr. Stamas was given information about Chronic Care Management services today including:  CCM service includes personalized support from designated clinical staff supervised by his physician, including individualized plan of care and coordination with other care providers 24/7 contact phone numbers for assistance for urgent and routine care needs. Service will only be billed when office clinical staff spend 20 minutes or more in a month to coordinate care. Only one practitioner may furnish and bill the service in a calendar month. The patient may stop CCM services at any time (effective at the end of the month) by phone call to the office staff. The patient will be responsible for cost sharing (co-pay) of up to 20% of the service fee (after annual deductible is met).  Patient agreed to services and verbal consent obtained.   Follow up plan: Telephone appointment with care management team member scheduled for: 01/05/2021  [place] [facility]

## 2021-01-05 ENCOUNTER — Ambulatory Visit (INDEPENDENT_AMBULATORY_CARE_PROVIDER_SITE_OTHER): Payer: Medicare Other | Admitting: *Deleted

## 2021-01-05 DIAGNOSIS — N1831 Chronic kidney disease, stage 3a: Secondary | ICD-10-CM | POA: Diagnosis not present

## 2021-01-05 DIAGNOSIS — E785 Hyperlipidemia, unspecified: Secondary | ICD-10-CM | POA: Diagnosis not present

## 2021-01-05 NOTE — Patient Instructions (Signed)
Visit Information   PATIENT GOALS:   Goals Addressed             This Visit's Progress    Follow My Treatment Plan-Chronic Kidney       Timeframe:  Long-Range Goal Priority:  High Start Date:     01/05/2021                        Expected End Date:       07/08/2021                Follow Up Date- 02/23/2021    - call for medicine refill 2 or 3 days before it runs out - keep follow-up appointments - keep taking my medicines, even when I feel good  - any exercise is helpful, continue to walk your dog - drink adequate water - keep blood pressure within normal limits - contact RN care manager for questions [phone]   Why is this important?   Staying as healthy as you can is very important. This may mean making changes if you smoke, don't exercise or eat poorly.  A healthy lifestyle is an important goal for you.  Following the treatment plan and making changes may be hard.  Try some of these steps to help keep the disease from getting worse.     Notes:       Manage My Cholesterol       Timeframe:  Long-Range Goal Priority:  Medium Start Date:       01/05/2021                      Expected End Date:      07/08/2021                 Follow Up Date- 02/23/2021   - change to whole grain breads, cereal, pasta - eat smaller or less servings of red meat - fill half the plate with nonstarchy vegetables - get blood test (fasting) done 1 week before next visit - increase the amount of fiber in food - read food labels for fat and fiber - follow up with your doctor as needed    Why is this important?   Changing cholesterol starts with eating heart-healthy foods.  Other steps may be to increase your activity and to quit if you smoke.    Notes:          Consent to CCM Services: [name] was given information about Chronic Care Management services today including:  CCM service includes personalized support from designated clinical staff supervised by his physician, including  individualized plan of care and coordination with other care providers 24/7 contact phone numbers for assistance for urgent and routine care needs. Service will only be billed when office clinical staff spend 20 minutes or more in a month to coordinate care. Only one practitioner may furnish and bill the service in a calendar month. The patient may stop CCM services at any time (effective at the end of the month) by phone call to the office staff. The patient will be responsible for cost sharing (co-pay) of up to 20% of the service fee (after annual deductible is met).  Patient agreed to services and verbal consent obtained.   Patient verbalizes understanding of instructions provided today and agrees to view in [place]. High Cholesterol  High cholesterol is a condition in which the blood has high levels of a white, waxy substance similar to  fat (cholesterol). The liver makes all the cholesterol that the body needs. The human body needs small amounts of cholesterol to help build cells. A person gets extra orexcess cholesterol from the food that he or she eats. The blood carries cholesterol from the liver to the rest of the body. If you have high cholesterol, deposits (plaques) may build up on the walls of your arteries. Arteries are the blood vessels that carry blood away from your heart. These plaques make the arteries narrowand stiff. Cholesterol plaques increase your risk for heart attack and stroke. Work withyour health care provider to keep your cholesterol levels in a healthy range. What increases the risk? The following factors may make you more likely to develop this condition: Eating foods that are high in animal fat (saturated fat) or cholesterol. Being overweight. Not getting enough exercise. A family history of high cholesterol (familial hypercholesterolemia). Use of tobacco products. Having diabetes. What are the signs or symptoms? There are no symptoms of this condition. How is  this diagnosed? This condition may be diagnosed based on the results of a blood test. If you are older than 74 years of age, your health care provider may check your cholesterol levels every 4-6 years. You may be checked more often if you have high cholesterol or other risk factors for heart disease. The blood test for cholesterol measures: "Bad" cholesterol, or LDL cholesterol. This is the main type of cholesterol that causes heart disease. The desired level is less than 100 mg/dL. "Good" cholesterol, or HDL cholesterol. HDL helps protect against heart disease by cleaning the arteries and carrying the LDL to the liver for processing. The desired level for HDL is 60 mg/dL or higher. Triglycerides. These are fats that your body can store or burn for energy. The desired level is less than 150 mg/dL. Total cholesterol. This measures the total amount of cholesterol in your blood and includes LDL, HDL, and triglycerides. The desired level is less than 200 mg/dL. How is this treated? This condition may be treated with: Diet changes. You may be asked to eat foods that have more fiber and less saturated fats or added sugar. Lifestyle changes. These may include regular exercise, maintaining a healthy weight, and quitting use of tobacco products. Medicines. These are given when diet and lifestyle changes have not worked. You may be prescribed a statin medicine to help lower your cholesterol levels. Follow these instructions at home: Eating and drinking  Eat a healthy, balanced diet. This diet includes: Daily servings of a variety of fresh, frozen, or canned fruits and vegetables. Daily servings of whole grain foods that are rich in fiber. Foods that are low in saturated fats and trans fats. These include poultry and fish without skin, lean cuts of meat, and low-fat dairy products. A variety of fish, especially oily fish that contain omega-3 fatty acids. Aim to eat fish at least 2 times a week. Avoid  foods and drinks that have added sugar. Use healthy cooking methods, such as roasting, grilling, broiling, baking, poaching, steaming, and stir-frying. Do not fry your food except for stir-frying.  Lifestyle  Get regular exercise. Aim to exercise for a total of 150 minutes a week. Increase your activity level by doing activities such as gardening, walking, and taking the stairs. Do not use any products that contain nicotine or tobacco, such as cigarettes, e-cigarettes, and chewing tobacco. If you need help quitting, ask your health care provider.  General instructions Take over-the-counter and prescription medicines only as told  by your health care provider. Keep all follow-up visits as told by your health care provider. This is important. Where to find more information American Heart Association: www.heart.org National Heart, Lung, and Blood Institute: https://wilson-eaton.com/ Contact a health care provider if: You have trouble achieving or maintaining a healthy diet or weight. You are starting an exercise program. You are unable to stop smoking. Get help right away if: You have chest pain. You have trouble breathing. You have any symptoms of a stroke. "BE FAST" is an easy way to remember the main warning signs of a stroke: B - Balance. Signs are dizziness, sudden trouble walking, or loss of balance. E - Eyes. Signs are trouble seeing or a sudden change in vision. F - Face. Signs are sudden weakness or numbness of the face, or the face or eyelid drooping on one side. A - Arms. Signs are weakness or numbness in an arm. This happens suddenly and usually on one side of the body. S - Speech. Signs are sudden trouble speaking, slurred speech, or trouble understanding what people say. T - Time. Time to call emergency services. Write down what time symptoms started. You have other signs of a stroke, such as: A sudden, severe headache with no known cause. Nausea or vomiting. Seizure. These symptoms  may represent a serious problem that is an emergency. Do not wait to see if the symptoms will go away. Get medical help right away. Call your local emergency services (911 in the [place] Do not drive yourself to the hospital. Summary Cholesterol plaques increase your risk for heart attack and stroke. Work with your health care provider to keep your cholesterol levels in a healthy range. Eat a healthy, balanced diet, get regular exercise, and maintain a healthy weight. Do not use any products that contain nicotine or tobacco, such as cigarettes, e-cigarettes, and chewing tobacco. Get help right away if you have any symptoms of a stroke. This information is not intended to replace advice given to you by your health care provider. Make sure you discuss any questions you have with your healthcare provider. Document Revised: 05/24/2019 Document Reviewed: 05/24/2019 Elsevier Patient Education  2022 [place]. Chronic Kidney Disease, Adult Chronic kidney disease is when lasting damage happens to the kidneys slowly over a long time. The kidneys help to: Make pee (urine). Make hormones. Keep the right amount of fluids and chemicals in the body. Most often, this disease does not go away. You must take steps to help keep the kidney damage from getting worse. If steps are not taken, the kidneys mightstop working forever. What are the causes? Diabetes. High blood pressure. Diseases that affect the heart and blood vessels. Other kidney diseases. Diseases of the body's disease-fighting system. A problem with the flow of pee. Infections of the organs that make pee, store it, and take it out of the body. Swelling or irritation of your blood vessels. What increases the risk? Getting older. Having someone in your family who has kidney disease or kidney failure. Having a disease caused by genes. Taking medicines often that harm the kidneys. Being near or having contact with harmful substances. Being  very overweight. Using tobacco now or in the past. What are the signs or symptoms? Feeling very tired. Having a swollen face, legs, ankles, or feet. Feeling like you may vomit or vomiting. Not feeling hungry. Being confused or not able to focus. Twitches and cramps in the leg muscles or other muscles. Dry, itchy skin. A taste of metal in  your mouth. Making less pee, or making more pee. Shortness of breath. Trouble sleeping. You may also become anemic or get weak bones. Anemic means there is not enoughred blood cells or hemoglobin in your blood. You may get symptoms slowly. You may not notice them until the kidney damagegets very bad. How is this treated? Often, there is no cure for this disease. Treatment can help with symptoms and help keep the disease from getting worse. You may need to: Avoid alcohol. Avoid foods that are high in salt, potassium, phosphorous, and protein. Take medicines for symptoms and to help control other conditions. Have dialysis. This treatment gets harmful waste out of your body. Treat other problems that cause your kidney disease or make it worse. Follow these instructions at home: Medicines Take over-the-counter and prescription medicines only as told by your doctor. Do not take any new medicines, vitamins, or supplements unless your doctor says it is okay. Lifestyle  Do not smoke or use any products that contain nicotine or tobacco. If you need help quitting, ask your doctor. If you drink alcohol: Limit how much you use to: 0-1 drink a day for women who are not pregnant. 0-2 drinks a day for men. Know how much alcohol is in your drink. In the [place] one drink equals one 12 oz bottle of beer (355 mL), one 5 oz glass of wine (148 mL), or one 1 oz glass of hard liquor (44 mL). Stay at a healthy weight. If you need help losing weight, ask your doctor.  General instructions  Follow instructions from your doctor about what you cannot eat or drink. Track  your blood pressure at home. Tell your doctor about any changes. If you have diabetes, track your blood sugar. Exercise at least 30 minutes a day, 5 days a week. Keep your shots (vaccinations) up to date. Keep all follow-up visits.  Where to find more information American Association of Kidney Patients: BombTimer.gl National Kidney Foundation: www.kidney.[place]: https://mathis.com/ Life Options: www.lifeoptions.org Kidney School: www.kidneyschool.org Contact a doctor if: Your symptoms get worse. You get new symptoms. Get help right away if: You get symptoms of end-stage kidney disease. These include: Headaches. Losing feeling in your hands or feet. Easy bruising. Having hiccups often. Chest pain. Shortness of breath. Lack of menstrual periods, in women. You have a fever. You make less pee than normal. You have pain or you bleed when you pee or poop. These symptoms may be an emergency. Get help right away. Call your local emergency services (911 in the [place] Do not wait to see if the symptoms will go away. Do not drive yourself to the hospital. Summary Chronic kidney disease is when lasting damage happens to the kidneys slowly over a long time. Causes of this disease include diabetes and high blood pressure. Often, there is no cure for this disease. Treatment can help symptoms and help keep the disease from getting worse. Treatment may involve lifestyle changes, medicines, and dialysis. This information is not intended to replace advice given to you by your health care provider. Make sure you discuss any questions you have with your healthcare provider. Document Revised: 09/29/2019 Document Reviewed: 09/29/2019 Elsevier Patient Education  2022 [place].   Telephone follow up appointment with care management team member scheduled for:  Jacqlyn Larsen [facility], BSN RN Case Manager [facility] [phone]   CLINICAL CARE PLAN: Patient Care  Plan: Chronic Kidney (Adult)     Problem Identified: Disease Progression   Priority:  High     Long-Range Goal: Disease Progression Prevented or Minimized   Start Date: 01/05/2021  Expected End Date: 07/08/2021  This Visit's Progress: On track  Priority: High  Note:   Current Barriers:  Ineffective Self Health Maintenance - pt can benefit from education regarding CKD and progression of disease Clinical Goal(s):  Collaboration with [name], MD regarding development and update of comprehensive plan of care as evidenced by provider attestation and co-signature Inter-disciplinary care team collaboration (see longitudinal plan of care) patient will work with care management team to address care coordination and chronic disease management needs related to Disease Management Educational Needs Care Coordination Medication Management and Education   Interventions:  Evaluation of current treatment plan related to CKD Stage 3,  self-management and patient's adherence to plan as established by provider. Collaboration with [name], MD regarding development and update of comprehensive plan of care as evidenced by provider attestation       and co-signature Inter-disciplinary care team collaboration (see longitudinal plan of care) Discussed plans with patient for ongoing care management follow up and provided patient with direct contact information for care management team Education Chronic Kidney Disease sent via My Chart Reviewed importance of keeping blood pressure within normal limits Reviewed importance of healthy diet and drinking adequate fluids/ water Self Care Activities:  Attends all scheduled provider appointments Attends church or other social activities Calls provider office for new concerns or questions Patient Goals:- call for medicine refill 2 or 3 days before it runs out - keep follow-up appointments - keep taking my medicines, even when I feel good  - any  exercise is helpful, continue to walk your dog - drink adequate water - keep blood pressure within normal limits - contact RN care manager for questions [phone] Follow Up Plan: Telephone follow up appointment with care management team member scheduled for:  02/23/2021     Patient Care Plan: Hyperlipidemia     Problem Identified: Health Promotion or Disease Self-Management Hyperlipidemia   Priority: Medium     Long-Range Goal: Self-Management Plan Developed Hyperlipidemia   Start Date: 01/05/2021  Expected End Date: 07/08/2021  This Visit's Progress: On track  Priority: Medium  Note:   Current Barriers:  Maintenance of hyperlipidemia for overall health Current antihyperlipidemic regimen-  zetia/ crestor Most recent lipid panel:     Component Value Date/Time   CHOL 100 05/04/2020 1533   TRIG 125 05/04/2020 1533   HDL 40 05/04/2020 1533   CHOLHDL 2.5 05/04/2020 1533   VLDL 39 (H) 03/07/2017 1100   LDLCALC 39 05/04/2020 1533   ASCVD risk enhancing conditions: age >86, CKD Does not adhere to provider recommendations re:  does not always adhere to healthy diet, eats fast food at times RN Care Manager Clinical Goal(s):  patient will work with Consulting civil engineer, providers, and care team towards execution of optimized self-health management plan patient will verbalize understanding of plan for maintaining and monitor cholesterol at goal patient will attend all scheduled medical appointments:  follow up with primary care provider. Interventions: Collaboration with [name], MD regarding development and update of comprehensive plan of care as evidenced by provider attestation and co-signature Inter-disciplinary care team collaboration (see longitudinal plan of care) Medication review performed; medication list updated in electronic medical record.  Inter-disciplinary care team collaboration (see longitudinal plan of care) Referred to pharmacy team for assistance with HLD  medication management Evaluation of current treatment plan related to hyperlipidemia and patient's adherence to plan  as established by provider. Discussed plans with patient for ongoing care management follow up and provided patient with direct contact information for care management team Reviewed importance of eliminating high fatty foods from diet Reviewed importance of exercising Patient Goals/Self-Care Activities: - call for medicine refill 2 or 3 days before it runs out - learn about small changes that will make a big difference - learn my personal risk factors- change to whole grain breads, cereal, pasta - eat smaller or less servings of red meat - fill half the plate with nonstarchy vegetables - get blood test (fasting) done 1 week before next visit - increase the amount of fiber in food - read food labels for fat and fiber - follow up with your doctor as needed  Follow Up Plan: Telephone follow up appointment with care management team member scheduled for:  02/23/2021

## 2021-01-05 NOTE — Chronic Care Management (AMB) (Signed)
Chronic Care Management   CCM RN Visit Note  01/05/2021 Name: [name] MRN: [id number] DOB: May 05, 1946  Subjective: [name] is a 75 y.o. year old male who is a primary care patient of [name], [name], MD. The care management team was consulted for assistance with disease management and care coordination needs.    Engaged with patient by telephone for initial visit in response to provider referral for case management and/or care coordination services.   Consent to Services:  The patient was given the following information about Chronic Care Management services today, agreed to services, and gave verbal consent: 1. CCM service includes personalized support from designated clinical staff supervised by the primary care provider, including individualized plan of care and coordination with other care providers 2. 24/7 contact phone numbers for assistance for urgent and routine care needs. 3. Service will only be billed when office clinical staff spend 20 minutes or more in a month to coordinate care. 4. Only one practitioner may furnish and bill the service in a calendar month. 5.The patient may stop CCM services at any time (effective at the end of the month) by phone call to the office staff. 6. The patient will be responsible for cost sharing (co-pay) of up to 20% of the service fee (after annual deductible is met). Patient agreed to services and consent obtained.  Patient agreed to services and verbal consent obtained.   Assessment: Review of patient past medical history, allergies, medications, health status, including review of consultants reports, laboratory and other test data, was performed as part of comprehensive evaluation and provision of chronic care management services.   SDOH (Social Determinants of Health) assessments and interventions performed:  SDOH Interventions    Flowsheet Row Most Recent Value  SDOH Interventions   Food Insecurity Interventions Intervention Not  Indicated  Transportation Interventions Intervention Not Indicated        CCM Care Plan  Allergies  Allergen Reactions   Augmentin [Amoxicillin-Pot Clavulanate] Nausea And Vomiting and Other (See Comments)    Has patient had a PCN reaction causing immediate rash, facial/tongue/throat swelling, SOB or lightheadedness with hypotension: No Has patient had a PCN reaction causing severe rash involving mucus membranes or skin necrosis: No Has patient had a PCN reaction that required hospitalization No Has patient had a PCN reaction occurring within the last 10 years: No If all of the above answers are "NO", then may proceed with Cephalosporin use.   Statins Other (See Comments)    Reaction:  Muscle pain     Outpatient Encounter Medications as of 01/05/2021  Medication Sig   acetaminophen (TYLENOL) 500 MG tablet Take 1 tablet (500 mg total) by mouth every 6 (six) hours as needed for mild pain, moderate pain or headache.   allopurinol (ZYLOPRIM) 300 MG tablet TAKE 1 TABLET BY MOUTH  DAILY   Cholecalciferol (VITAMIN D3 PO) Take by mouth.   clonazePAM (KLONOPIN) 0.5 MG tablet Take 0.5 mg by mouth 2 (two) times daily as needed.   colchicine 0.6 MG tablet Take 0.6-1.2 mg by mouth 2 (two) times daily as needed (for gout flare).    escitalopram (LEXAPRO) 20 MG tablet Take 2 tablets (40 mg total) by mouth at bedtime.   ezetimibe (ZETIA) 10 MG tablet TAKE 1 TABLET BY MOUTH IN  THE MORNING   fluticasone (FLONASE) 50 MCG/ACT nasal spray USE 2 SPRAYS IN BOTH  NOSTRILS DAILY   gabapentin (NEURONTIN) 300 MG capsule TAKE 1 CAPSULE BY MOUTH 3  TIMES  DAILY AS NEEDED   omeprazole (PRILOSEC) 20 MG capsule TAKE 1 CAPSULE BY MOUTH  DAILY   rOPINIRole (REQUIP) 4 MG tablet Take 4 mg by mouth at bedtime.    rosuvastatin (CRESTOR) 5 MG tablet TAKE 1 TABLET BY MOUTH  DAILY   vitamin B-12 (CYANOCOBALAMIN) 1000 MCG tablet Take 1,000 mcg by mouth daily.   aspirin EC 81 MG tablet Take 81 mg by mouth at bedtime.    mupirocin cream (BACTROBAN) 2 % Apply 1 application topically 2 (two) times daily.   Omega-3 Fatty Acids (FISH OIL PO) Take by mouth.   traZODone (DESYREL) 100 MG tablet Take 50 mg by mouth daily as needed for sleep.  (Patient not taking: Reported on 01/05/2021)   No facility-administered encounter medications on file as of 01/05/2021.    Patient Active Problem List   Diagnosis Date Noted   MRSA (methicillin resistant staph aureus) culture positive 12/27/2019   UTI (urinary tract infection) 08/24/2016   Fever 08/24/2016   History of nephrectomy 08/24/2016   CKD (chronic kidney disease) stage 3, GFR 30-59 ml/min (HCC) 08/24/2016   Flu-like symptoms 08/24/2016   Sepsis due to Escherichia coli (E. coli) ([place]) 04/14/2015   Bacteremia due to Gram-negative bacteria ([place]), E. Coli 04/14/2015   UTI (lower urinary tract infection) 04/13/2015   Acute renal failure superimposed on stage 3 chronic kidney disease ([place]) 04/13/2015   Anemia of chronic kidney failure 04/13/2015   Acute hyponatremia 04/13/2015   Bladder cancer ([place]) 06/23/2013   Hyperlipidemia    GERD (gastroesophageal reflux disease) 12/22/2012    Conditions to be addressed/monitored:HLD and CKD Stage 3  Care Plan : Chronic Kidney (Adult)  Updates made by Kassie Mends, RN since 01/05/2021 12:00 AM     Problem: Disease Progression   Priority: High     Long-Range Goal: Disease Progression Prevented or Minimized   Start Date: 01/05/2021  Expected End Date: 07/08/2021  This Visit's Progress: On track  Priority: High  Note:   Current Barriers:  Ineffective Self Health Maintenance - pt can benefit from education regarding CKD and progression of disease Clinical Goal(s):  Collaboration with [name], MD regarding development and update of comprehensive plan of care as evidenced by provider attestation and co-signature Inter-disciplinary care team collaboration (see longitudinal plan of care) patient will work with care  management team to address care coordination and chronic disease management needs related to Disease Management Educational Needs Care Coordination Medication Management and Education   Interventions:  Evaluation of current treatment plan related to CKD Stage 3,  self-management and patient's adherence to plan as established by provider. Collaboration with [name], MD regarding development and update of comprehensive plan of care as evidenced by provider attestation       and co-signature Inter-disciplinary care team collaboration (see longitudinal plan of care) Discussed plans with patient for ongoing care management follow up and provided patient with direct contact information for care management team Education Chronic Kidney Disease sent via My Chart Reviewed importance of keeping blood pressure within normal limits Reviewed importance of healthy diet and drinking adequate fluids/ water Self Care Activities:  Attends all scheduled provider appointments Attends church or other social activities Calls provider office for new concerns or questions Patient Goals:- call for medicine refill 2 or 3 days before it runs out - keep follow-up appointments - keep taking my medicines, even when I feel good  - any exercise is helpful, continue to walk your dog - drink adequate water -  keep blood pressure within normal limits - contact RN care manager for questions [phone] Follow Up Plan: Telephone follow up appointment with care management team member scheduled for:  02/23/2021     Care Plan : Hyperlipidemia  Updates made by Kassie Mends, RN since 01/05/2021 12:00 AM     Problem: Health Promotion or Disease Self-Management Hyperlipidemia   Priority: Medium     Long-Range Goal: Self-Management Plan Developed Hyperlipidemia   Start Date: 01/05/2021  Expected End Date: 07/08/2021  This Visit's Progress: On track  Priority: Medium  Note:   Current Barriers:  Maintenance of  hyperlipidemia for overall health Current antihyperlipidemic regimen-  zetia/ crestor Most recent lipid panel:     Component Value Date/Time   CHOL 100 05/04/2020 1533   TRIG 125 05/04/2020 1533   HDL 40 05/04/2020 1533   CHOLHDL 2.5 05/04/2020 1533   VLDL 39 (H) 03/07/2017 1100   LDLCALC 39 05/04/2020 1533   ASCVD risk enhancing conditions: age >41, CKD Does not adhere to provider recommendations re:  does not always adhere to healthy diet, eats fast food at times RN Care Manager Clinical Goal(s):  patient will work with Consulting civil engineer, providers, and care team towards execution of optimized self-health management plan patient will verbalize understanding of plan for maintaining and monitor cholesterol at goal patient will attend all scheduled medical appointments:  follow up with primary care provider. Interventions: Collaboration with [name], MD regarding development and update of comprehensive plan of care as evidenced by provider attestation and co-signature Inter-disciplinary care team collaboration (see longitudinal plan of care) Medication review performed; medication list updated in electronic medical record.  Inter-disciplinary care team collaboration (see longitudinal plan of care) Referred to pharmacy team for assistance with HLD medication management Evaluation of current treatment plan related to hyperlipidemia and patient's adherence to plan as established by provider. Discussed plans with patient for ongoing care management follow up and provided patient with direct contact information for care management team Reviewed importance of eliminating high fatty foods from diet Reviewed importance of exercising Patient Goals/Self-Care Activities: - call for medicine refill 2 or 3 days before it runs out - learn about small changes that will make a big difference - learn my personal risk factors- change to whole grain breads, cereal, pasta - eat smaller or less  servings of red meat - fill half the plate with nonstarchy vegetables - get blood test (fasting) done 1 week before next visit - increase the amount of fiber in food - read food labels for fat and fiber - follow up with your doctor as needed  Follow Up Plan: Telephone follow up appointment with care management team member scheduled for:  02/23/2021       Plan:Telephone follow up appointment with care management team member scheduled for:  02/23/2021  Jacqlyn Larsen [facility], BSN RN Case Manager [facility] [phone]

## 2021-01-25 DIAGNOSIS — F331 Major depressive disorder, recurrent, moderate: Secondary | ICD-10-CM | POA: Diagnosis not present

## 2021-01-25 DIAGNOSIS — F411 Generalized anxiety disorder: Secondary | ICD-10-CM | POA: Diagnosis not present

## 2021-01-25 DIAGNOSIS — Z634 Disappearance and death of family member: Secondary | ICD-10-CM | POA: Diagnosis not present

## 2021-02-14 ENCOUNTER — Ambulatory Visit (INDEPENDENT_AMBULATORY_CARE_PROVIDER_SITE_OTHER): Payer: Medicare Other | Admitting: Podiatry

## 2021-02-14 ENCOUNTER — Other Ambulatory Visit: Payer: Self-pay | Admitting: Family Medicine

## 2021-02-14 ENCOUNTER — Ambulatory Visit (INDEPENDENT_AMBULATORY_CARE_PROVIDER_SITE_OTHER): Payer: Medicare Other

## 2021-02-14 ENCOUNTER — Other Ambulatory Visit: Payer: Self-pay

## 2021-02-14 DIAGNOSIS — M79671 Pain in right foot: Secondary | ICD-10-CM

## 2021-02-14 DIAGNOSIS — G5761 Lesion of plantar nerve, right lower limb: Secondary | ICD-10-CM

## 2021-02-14 DIAGNOSIS — G5762 Lesion of plantar nerve, left lower limb: Secondary | ICD-10-CM

## 2021-02-14 DIAGNOSIS — M79672 Pain in left foot: Secondary | ICD-10-CM

## 2021-02-14 NOTE — Progress Notes (Signed)
DG FO

## 2021-02-15 ENCOUNTER — Encounter: Payer: Self-pay | Admitting: Podiatry

## 2021-02-15 NOTE — Progress Notes (Signed)
Subjective:  Patient ID: [name], male    DOB: 04/10/1946,  MRN: [id number]  Chief Complaint  Patient presents with   Foot Pain    Pt states he has bilateral foot pain on the bottom of his feet. Pt states it feels like he is stepping on gravel. Pt states also on the top of his toes it feels raw.     75 y.o. male presents with the above complaint.  Patient presents with complaint of bilateral forefoot pain.  Patient states he feels like he is stepping on a gravel with every step.  Patient states it hurts on the top of his foot and now in the bottom of his foot as well.  He has been going on for a while has progressed to gotten worse.  He would like to get evaluated he has not had any history of neuromas in the past.  He denies any other acute complaints.   Review of Systems: Negative except as noted in the HPI. Denies N/V/F/Ch.  Past Medical History:  Diagnosis Date   Allergy    Bacteremia due to Gram-negative bacteria 04/13/2015   Bladder rupture 2010   Bladder tumor    Cancer [facility])    bladder   Chronic kidney disease    Depression    Gout    History of unilateral nephrectomy 07/2014   [facility]   Hyperlipidemia    Restless leg syndrome, controlled     Current Outpatient Medications:    acetaminophen (TYLENOL) 500 MG tablet, Take 1 tablet (500 mg total) by mouth every 6 (six) hours as needed for mild pain, moderate pain or headache., Disp: 30 tablet, Rfl: 0   allopurinol (ZYLOPRIM) 300 MG tablet, TAKE 1 TABLET BY MOUTH  DAILY, Disp: 90 tablet, Rfl: 3   aspirin EC 81 MG tablet, Take 81 mg by mouth at bedtime., Disp: , Rfl:    Cholecalciferol (VITAMIN D3 PO), Take by mouth., Disp: , Rfl:    clonazePAM (KLONOPIN) 0.5 MG tablet, Take 0.5 mg by mouth 2 (two) times daily as needed., Disp: , Rfl:    colchicine 0.6 MG tablet, Take 0.6-1.2 mg by mouth 2 (two) times daily as needed (for gout flare). , Disp: , Rfl:    escitalopram (LEXAPRO) 20 MG tablet, Take 2 tablets (40 mg total) by mouth  at bedtime., Disp: 180 tablet, Rfl: 3   ezetimibe (ZETIA) 10 MG tablet, TAKE 1 TABLET BY MOUTH IN  THE MORNING, Disp: 90 tablet, Rfl: 3   fluticasone (FLONASE) 50 MCG/ACT nasal spray, USE 2 SPRAYS IN BOTH  NOSTRILS DAILY, Disp: 48 g, Rfl: 3   gabapentin (NEURONTIN) 300 MG capsule, TAKE 1 CAPSULE BY MOUTH 3  TIMES DAILY AS NEEDED, Disp: 270 capsule, Rfl: 3   mupirocin cream (BACTROBAN) 2 %, Apply 1 application topically 2 (two) times daily., Disp: 15 g, Rfl: 0   Omega-3 Fatty Acids (FISH OIL PO), Take by mouth., Disp: , Rfl:    omeprazole (PRILOSEC) 20 MG capsule, TAKE 1 CAPSULE BY MOUTH  DAILY, Disp: 90 capsule, Rfl: 3   rOPINIRole (REQUIP) 4 MG tablet, Take 4 mg by mouth at bedtime. , Disp: , Rfl:    rosuvastatin (CRESTOR) 5 MG tablet, TAKE 1 TABLET BY MOUTH  DAILY, Disp: 90 tablet, Rfl: 3   traZODone (DESYREL) 100 MG tablet, Take 50 mg by mouth daily as needed for sleep.  (Patient not taking: Reported on 01/05/2021), Disp: , Rfl:    vitamin B-12 (CYANOCOBALAMIN) 1000 MCG tablet,  Take 1,000 mcg by mouth daily., Disp: , Rfl:   Social History   Tobacco Use  Smoking Status Former   Packs/day: 0.50   Years: 30.00   Pack years: 15.00   Types: Cigarettes   Quit date: 07/24/1989   Years since quitting: 31.5  Smokeless Tobacco Never    Allergies  Allergen Reactions   Augmentin [Amoxicillin-Pot Clavulanate] Nausea And Vomiting and Other (See Comments)    Has patient had a PCN reaction causing immediate rash, facial/tongue/throat swelling, SOB or lightheadedness with hypotension: No Has patient had a PCN reaction causing severe rash involving mucus membranes or skin necrosis: No Has patient had a PCN reaction that required hospitalization No Has patient had a PCN reaction occurring within the last 10 years: No If all of the above answers are "NO", then may proceed with Cephalosporin use.   Statins Other (See Comments)    Reaction:  Muscle pain    Objective:  There were no vitals filed for  this visit. There is no height or weight on file to calculate BMI. Constitutional Well developed. Well nourished.  Vascular Dorsalis pedis pulses palpable bilaterally. Posterior tibial pulses palpable bilaterally. Capillary refill normal to all digits.  No cyanosis or clubbing noted. Pedal hair growth normal.  Neurologic Normal speech. Oriented to person, place, and time. Epicritic sensation to light touch grossly present bilaterally.  Dermatologic Nails well groomed and normal in appearance. No open wounds. No skin lesions.  Orthopedic: Bilateral second interspace neuromas.  Positive Mulder's click noted.  Tingling and numbness noted to the digits.  Negative neuromas to other interspaces.  No pain with range of motion of second and third metatarsophalangeal joint   Radiographs: 3 views of skeletally mature adult bilateral foot: No bony abnormalities identified.  Mild case of [place] sign noted.  No intermetatarsal space decreasing noted. Assessment:   1. 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 Neuroma of second interspace of left foot    Plan:  Patient was evaluated and treated and all questions answered.  Bilateral second interspace neuroma -I explained the patient the etiology of neuroma and various treatment options were extensively discussed.  I discussed with him the importance of diagnosing a neuroma.  I believe patient will benefit from diagnostic block to both interspaces.  I ultimately discussed with her that we will plan on doing a steroid injection to rule out capsulitis nearby as it could also be the cause of his pain.  Patient agrees with the plan.  If he has relief for a long period of time then is likely capsulitis if it is far less.  At times and has neuroma.  I will discuss further management based on the findings. -A steroid injection was performed at bilateral second interspace using 1% plain Lidocaine and 10 mg of Kenalog. This was well  tolerated.   No follow-ups on file.

## 2021-02-20 ENCOUNTER — Ambulatory Visit: Payer: Medicare Other | Admitting: Family Medicine

## 2021-02-23 ENCOUNTER — Encounter: Payer: Self-pay | Admitting: Family Medicine

## 2021-02-23 ENCOUNTER — Ambulatory Visit (INDEPENDENT_AMBULATORY_CARE_PROVIDER_SITE_OTHER): Payer: Medicare Other | Admitting: *Deleted

## 2021-02-23 ENCOUNTER — Other Ambulatory Visit: Payer: Self-pay

## 2021-02-23 ENCOUNTER — Ambulatory Visit (INDEPENDENT_AMBULATORY_CARE_PROVIDER_SITE_OTHER): Payer: Medicare Other | Admitting: Family Medicine

## 2021-02-23 VITALS — BP 108/62 | HR 82 | Temp 98.1°F | Resp 16 | Ht 69.0 in | Wt 217.0 lb

## 2021-02-23 DIAGNOSIS — G629 Polyneuropathy, unspecified: Secondary | ICD-10-CM

## 2021-02-23 DIAGNOSIS — E785 Hyperlipidemia, unspecified: Secondary | ICD-10-CM

## 2021-02-23 DIAGNOSIS — E538 Deficiency of other specified B group vitamins: Secondary | ICD-10-CM | POA: Diagnosis not present

## 2021-02-23 DIAGNOSIS — R7303 Prediabetes: Secondary | ICD-10-CM | POA: Diagnosis not present

## 2021-02-23 DIAGNOSIS — N1831 Chronic kidney disease, stage 3a: Secondary | ICD-10-CM | POA: Diagnosis not present

## 2021-02-23 NOTE — Patient Instructions (Addendum)
Visit Information  PATIENT GOALS:  Goals Addressed             This Visit's Progress    Follow My Treatment Plan-Chronic Kidney       Timeframe:  Long-Range Goal Priority:  High Start Date:     01/05/2021                        Expected End Date:       07/08/2021                Follow Up Date- 04/13/2021   - call for medicine refill 2 or 3 days before it runs out - keep follow-up appointments- Boneta Lucks 9/21 - take medications as prescribed - any exercise is helpful, continue to walk your dog - drink adequate water - keep blood pressure within normal limits - follow up with your doctor about [place]  - contact RN care manager for questions [phone]   Why is this important?   Staying as healthy as you can is very important. This may mean making changes if you smoke, don't exercise or eat poorly.  A healthy lifestyle is an important goal for you.  Following the treatment plan and making changes may be hard.  Try some of these steps to help keep the disease from getting worse.     Notes:      Manage My Cholesterol       Timeframe:  Long-Range Goal Priority:  Medium Start Date:       01/05/2021                      Expected End Date:      07/08/2021                 Follow Up Date- 04/13/2021   - eat whole grain breads, cereal, pasta - eat smaller or less servings of red meat - fill half the plate with nonstarchy vegetables - eat foods high in fiber - read food labels for fat and fiber - follow up with your doctor as needed  - look over education sent via My Chart- Heart healthy diet   Why is this important?   Changing cholesterol starts with eating heart-healthy foods.  Other steps may be to increase your activity and to quit if you smoke.    Notes:         Patient verbalizes understanding of instructions provided today and agrees to view in [place].   Telephone follow up appointment with care management team member scheduled for:  04/13/2021  Jacqlyn Larsen  [facility], BSN RN Case Manager [facility] [phone] High-Fiber Eating Plan Fiber, also called dietary fiber, is a type of carbohydrate. It is found foods such as fruits, vegetables, whole grains, and beans. A high-fiber diet can have many health benefits. Your health care provider may recommend a high-fiber diet to help: Prevent constipation. Fiber can make your bowel movements more regular. Lower your cholesterol. Relieve the following conditions: Inflammation of veins in the anus (hemorrhoids). Inflammation of specific areas of the digestive tract (uncomplicated diverticulosis). A problem of the large intestine, also called the colon, that sometimes causes pain and diarrhea (irritable bowel syndrome, or IBS). Prevent overeating as part of a weight-loss plan. Prevent heart disease, type 2 diabetes, and certain cancers. What are tips for following this plan? Reading food labels  Check the nutrition facts label on food products for the  amount of dietary fiber. Choose foods that have 5 grams of fiber or more per serving. The goals for recommended daily fiber intake include: Men (age 68 or younger): 34-38 g. Men (over age 22): 28-34 g. Women (age 7 or younger): 25-28 g. Women (over age 84): 22-25 g. Your daily fiber goal is _____________ g. Shopping Choose whole fruits and vegetables instead of processed forms, such as apple juice or applesauce. Choose a wide variety of high-fiber foods such as avocados, lentils, oats, and kidney beans. Read the nutrition facts label of the foods you choose. Be aware of foods with added fiber. These foods often have high sugar and sodium amounts per serving. Cooking Use whole-grain flour for baking and cooking. Cook with brown rice instead of white rice. Meal planning Start the day with a breakfast that is high in fiber, such as a cereal that contains 5 g of fiber or more per serving. Eat breads and cereals that are made with whole-grain  flour instead of refined flour or white flour. Eat brown rice, bulgur wheat, or millet instead of white rice. Use beans in place of meat in soups, salads, and pasta dishes. Be sure that half of the grains you eat each day are whole grains. General information You can get the recommended daily intake of dietary fiber by: Eating a variety of fruits, vegetables, grains, nuts, and beans. Taking a fiber supplement if you are not able to take in enough fiber in your diet. It is better to get fiber through food than from a supplement. Gradually increase how much fiber you consume. If you increase your intake of dietary fiber too quickly, you may have bloating, cramping, or gas. Drink plenty of water to help you digest fiber. Choose high-fiber snacks, such as berries, raw vegetables, nuts, and popcorn. What foods should I eat? Fruits Berries. Pears. Apples. Oranges. Avocado. Prunes and raisins. Dried figs. Vegetables Sweet potatoes. Spinach. Kale. Artichokes. Cabbage. Broccoli. Cauliflower.Green peas. Carrots. Squash. Grains Whole-grain breads. Multigrain cereal. Oats and oatmeal. Brown rice. Barley.Bulgur wheat. [place]. Quinoa. Bran muffins. Popcorn. Rye wafer crackers. Meats and other proteins Navy beans, kidney beans, and pinto beans. Soybeans. Split peas. Lentils. Nutsand seeds. Dairy Fiber-fortified yogurt. Beverages Fiber-fortified soy milk. Fiber-fortified orange juice. Other foods Fiber bars. The items listed above may not be a complete list of recommended foods and beverages. Contact a dietitian for more information. What foods should I avoid? Fruits Fruit juice. Cooked, strained fruit. Vegetables Fried potatoes. Canned vegetables. Well-cooked vegetables. Grains White bread. Pasta made with refined flour. White rice. Meats and other proteins Fatty cuts of meat. Fried chicken or fried fish. Dairy Milk. Yogurt. Cream cheese. Sour cream. Fats and oils Butters. Beverages Soft  drinks. Other foods Cakes and pastries. The items listed above may not be a complete list of foods and beverages to avoid. Talk with your dietitian about what choices are best for you. Summary Fiber is a type of carbohydrate. It is found in foods such as fruits, vegetables, whole grains, and beans. A high-fiber diet has many benefits. It can help to prevent constipation, lower blood cholesterol, aid weight loss, and reduce your risk of heart disease, diabetes, and certain cancers. Increase your intake of fiber gradually. Increasing fiber too quickly may cause cramping, bloating, and gas. Drink plenty of water while you increase the amount of fiber you consume. The best sources of fiber include whole fruits and vegetables, whole grains, nuts, seeds, and beans. This information is not intended to replace advice  given to you by your health care provider. Make sure you discuss any questions you have with your healthcare provider. Document Revised: 10/28/2019 Document Reviewed: 10/28/2019 Elsevier Patient Education  2022 [place] Heart-healthy meal planning includes: Eating less unhealthy fats. Eating more healthy fats. Making other changes in your diet. Talk with your doctor or a diet specialist (dietitian) to create an eating plan that is right for you. What is my plan? Your doctor may recommend an eating plan that includes: Total fat: ______% or less of total calories a day. Saturated fat: ______% or less of total calories a day. Cholesterol: less than _________mg a day. What are tips for following this plan? Cooking Avoid frying your food. Try to bake, boil, grill, or broil it instead. You can also reduce fat by: Removing the skin from poultry. Removing all visible fats from meats. Steaming vegetables in water or broth. Meal planning  At meals, divide your plate into four equal parts: Fill one-half of your plate with vegetables and green salads. Fill  one-fourth of your plate with whole grains. Fill one-fourth of your plate with lean protein foods. Eat 4-5 servings of vegetables per day. A serving of vegetables is: 1 cup of raw or cooked vegetables. 2 cups of raw leafy greens. Eat 4-5 servings of fruit per day. A serving of fruit is: 1 medium whole fruit.  cup of dried fruit.  cup of fresh, frozen, or canned fruit.  cup of 100% fruit juice. Eat more foods that have soluble fiber. These are apples, broccoli, carrots, beans, peas, and barley. Try to get 20-30 g of fiber per day. Eat 4-5 servings of nuts, legumes, and seeds per week: 1 serving of dried beans or legumes equals  cup after being cooked. 1 serving of nuts is  cup. 1 serving of seeds equals 1 tablespoon.  General information Eat more home-cooked food. Eat less restaurant, buffet, and fast food. Limit or avoid alcohol. Limit foods that are high in starch and sugar. Avoid fried foods. Lose weight if you are overweight. Keep track of how much salt (sodium) you eat. This is important if you have high blood pressure. Ask your doctor to tell you more about this. Try to add vegetarian meals each week. Fats Choose healthy fats. These include olive oil and canola oil, flaxseeds, walnuts, almonds, and seeds. Eat more omega-3 fats. These include salmon, mackerel, sardines, tuna, flaxseed oil, and ground flaxseeds. Try to eat fish at least 2 times each week. Check food labels. Avoid foods with trans fats or high amounts of saturated fat. Limit saturated fats. These are often found in animal products, such as meats, butter, and cream. These are also found in plant foods, such as palm oil, palm kernel oil, and coconut oil. Avoid foods with partially hydrogenated oils in them. These have trans fats. Examples are stick margarine, some tub margarines, cookies, crackers, and other baked goods. What foods can I eat? Fruits All fresh, canned (in natural juice), or frozen  fruits. Vegetables Fresh or frozen vegetables (raw, steamed, roasted, or grilled). Green salads. Grains Most grains. Choose whole wheat and whole grains most of the time. Rice andpasta, including brown rice and pastas made with whole wheat. Meats and other proteins Lean, well-trimmed beef, veal, pork, and lamb. Chicken and [place] without skin. All fish and shellfish. Wild duck, rabbit, pheasant, and venison. Egg whites or low-cholesterol egg substitutes. Dried beans, peas, lentils, and tofu. Seedsand most nuts. Dairy Low-fat or nonfat cheeses, including  ricotta and mozzarella. Skim or 1% milk that is liquid, powdered, or evaporated. Buttermilk that is made with low-fatmilk. Nonfat or low-fat yogurt. Fats and oils Non-hydrogenated (trans-free) margarines. Vegetable oils, including soybean, sesame, sunflower, olive, peanut, safflower, corn, canola, and cottonseed. Salad dressings or mayonnaisemade with a vegetable oil. Beverages Mineral water. Coffee and tea. Diet carbonated beverages. Sweets and desserts Sherbet, gelatin, and fruit ice. Small amounts of dark chocolate. Limit all sweets and desserts. Seasonings and condiments All seasonings and condiments. The items listed above may not be a complete list of foods and drinks you can eat. Contact a dietitian for more options. What foods should I avoid? Fruits Canned fruit in heavy syrup. Fruit in cream or butter sauce. Fried fruit. Limitcoconut. Vegetables Vegetables cooked in cheese, cream, or butter sauce. Fried vegetables. Grains Breads that are made with saturated or trans fats, oils, or whole milk. Croissants. Sweet rolls. Donuts. High-fat crackers,such as cheese crackers. Meats and other proteins Fatty meats, such as hot dogs, ribs, sausage, bacon, rib-eye roast or steak. High-fat deli meats, such as salami and bologna. Caviar. Domestic duck andgoose. Organ meats, such as liver. Dairy Cream, sour cream, cream cheese, and creamed  cottage cheese. Whole-milk cheeses. Whole or 2% milk that is liquid, evaporated, or condensed. Whole buttermilk. Cream sauce or high-fat cheese sauce. Yogurt that is made fromwhole milk. Fats and oils Meat fat, or shortening. Cocoa butter, hydrogenated oils, palm oil, coconut oil, palm kernel oil. Solid fats and shortenings, including bacon fat, salt pork, lard, and butter. Nondairy cream substitutes. Salad dressings with cheeseor sour cream. Beverages Regular sodas and juice drinks with added sugar. Sweets and desserts Frosting. Pudding. Cookies. Cakes. Pies. Milk chocolate or white chocolate.Buttered syrups. Full-fat ice cream or ice cream drinks. The items listed above may not be a complete list of foods and drinks to avoid. Contact a dietitian for more information. Summary Heart-healthy meal planning includes eating less unhealthy fats, eating more healthy fats, and making other changes in your diet. Eat a balanced diet. This includes fruits and vegetables, low-fat or nonfat dairy, lean protein, nuts and legumes, whole grains, and heart-healthy oils and fats. This information is not intended to replace advice given to you by your health care provider. Make sure you discuss any questions you have with your healthcare provider. Document Revised: 08/28/2017 Document Reviewed: 08/01/2017 Elsevier Patient Education  2022 [employer].

## 2021-02-23 NOTE — Chronic Care Management (AMB) (Signed)
Chronic Care Management   CCM RN Visit Note  02/23/2021 Name: [name] MRN: [id number] DOB: 04-06-1946  Subjective: [name] is a 75 y.o. year old male who is a primary care patient of [name], [name], MD. The care management team was consulted for assistance with disease management and care coordination needs.    Engaged with patient by telephone for follow up visit in response to provider referral for case management and/or care coordination services.   Consent to Services:  The patient was given information about Chronic Care Management services, agreed to services, and gave verbal consent prior to initiation of services.  Please see initial visit note for detailed documentation.   Patient agreed to services and verbal consent obtained.   Assessment: Review of patient past medical history, allergies, medications, health status, including review of consultants reports, laboratory and other test data, was performed as part of comprehensive evaluation and provision of chronic care management services.   SDOH (Social Determinants of Health) assessments and interventions performed:    CCM Care Plan  Allergies  Allergen Reactions   Augmentin [Amoxicillin-Pot Clavulanate] Nausea And Vomiting and Other (See Comments)    Has patient had a PCN reaction causing immediate rash, facial/tongue/throat swelling, SOB or lightheadedness with hypotension: No Has patient had a PCN reaction causing severe rash involving mucus membranes or skin necrosis: No Has patient had a PCN reaction that required hospitalization No Has patient had a PCN reaction occurring within the last 10 years: No If all of the above answers are "NO", then may proceed with Cephalosporin use.   Statins Other (See Comments)    Reaction:  Muscle pain     Outpatient Encounter Medications as of 02/23/2021  Medication Sig   allopurinol (ZYLOPRIM) 300 MG tablet TAKE 1 TABLET BY MOUTH  DAILY   clonazePAM (KLONOPIN) 0.5 MG  tablet Take 0.5 mg by mouth 2 (two) times daily as needed.   colchicine 0.6 MG tablet Take 0.6-1.2 mg by mouth 2 (two) times daily as needed (for gout flare).    escitalopram (LEXAPRO) 20 MG tablet Take 2 tablets (40 mg total) by mouth at bedtime.   ezetimibe (ZETIA) 10 MG tablet TAKE 1 TABLET BY MOUTH IN  THE MORNING   fluticasone (FLONASE) 50 MCG/ACT nasal spray USE 2 SPRAYS IN BOTH  NOSTRILS DAILY   gabapentin (NEURONTIN) 300 MG capsule TAKE 1 CAPSULE BY MOUTH 3  TIMES DAILY AS NEEDED   omeprazole (PRILOSEC) 20 MG capsule TAKE 1 CAPSULE BY MOUTH  DAILY   rOPINIRole (REQUIP) 4 MG tablet Take 4 mg by mouth at bedtime.    rosuvastatin (CRESTOR) 5 MG tablet TAKE 1 TABLET BY MOUTH  DAILY   traZODone (DESYREL) 100 MG tablet Take 50 mg by mouth daily as needed for sleep.   vitamin B-12 (CYANOCOBALAMIN) 1000 MCG tablet Take 1,000 mcg by mouth daily.   No facility-administered encounter medications on file as of 02/23/2021.    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 coli) ([place]) 04/14/2015   Bacteremia due to Gram-negative bacteria ([place]), E. Coli 04/14/2015   UTI (lower urinary tract infection) 04/13/2015   Anemia of chronic kidney failure 04/13/2015   Acute hyponatremia 04/13/2015  Bladder cancer ([place]) 06/23/2013   Hyperlipidemia    GERD (gastroesophageal reflux disease) 12/22/2012    Conditions to be addressed/monitored:HLD and CKD Stage 3  Care Plan : Chronic Kidney (Adult)  Updates made by Kassie Mends, RN since 02/23/2021 12:00 AM     Problem: Disease Progression   Priority: High     Long-Range Goal: Disease Progression Prevented or Minimized   Start Date: 01/05/2021  Expected End Date:  07/08/2021  This Visit's Progress: On track  Recent Progress: On track  Priority: High  Note:   Current Barriers:  Ineffective Self Health Maintenance - pt can benefit from education regarding CKD and progression of disease.  Patient reports he had B12 level checked today at primary care provider visit, has been taking Vitamin B12 tablets and depending on what lab work shows may go on Vitamin B12 injections.  Patient reports he discussed gabapentin does not help with neuropathy and discussed with primary care provider and may stop taking gabapentin. Clinical Goal(s):  Collaboration with [name], MD regarding development and update of comprehensive plan of care as evidenced by provider attestation and co-signature Inter-disciplinary care team collaboration (see longitudinal plan of care) patient will work with care management team to address care coordination and chronic disease management needs related to Disease Management Educational Needs Care Coordination Medication Management and Education   Interventions:  Evaluation of current treatment plan related to CKD Stage 3,  self-management and patient's adherence to plan as established by provider. Collaboration with [name], MD regarding development and update of comprehensive plan of care as evidenced by provider attestation       and co-signature Inter-disciplinary care team collaboration (see longitudinal plan of care) Reinforced plans with patient for ongoing care management follow up and provided patient with direct contact information for care management team Reinforced importance of keeping blood pressure within normal limits Reinforced importance of healthy diet and drinking adequate fluids/ water Encouraged pt to get outside daily and get exercise Reviewed upcoming scheduled appoinments Self Care Activities:  Attends all scheduled provider appointments Attends church or other social activities Calls provider  office for new concerns or questions Patient Goals:- call for medicine refill 2 or 3 days before it runs out - call for medicine refill 2 or 3 days before it runs out - keep follow-up appointments- Boneta Lucks 9/21 - take medications as prescribed - any exercise is helpful, continue to walk your dog - drink adequate water - keep blood pressure within normal limits - follow up with your doctor about [place]  - contact RN care manager for questions [phone] Follow Up Plan: Telephone follow up appointment with care management team member scheduled for:  04/13/2021     Care Plan : Hyperlipidemia  Updates made by Kassie Mends, RN since 02/23/2021 12:00 AM     Problem: Health Promotion or Disease Self-Management Hyperlipidemia   Priority: Medium     Long-Range Goal: Self-Management Plan Developed Hyperlipidemia   Start Date: 01/05/2021  Expected End Date: 07/08/2021  This Visit's Progress: On track  Recent Progress: On track  Priority: Medium  Note:   Current Barriers:  Maintenance of hyperlipidemia for overall health Current antihyperlipidemic regimen-  zetia/ crestor Most recent lipid panel:     Component Value Date/Time   CHOL 100 05/04/2020 1533   TRIG 125 05/04/2020 1533   HDL 40 05/04/2020 1533   CHOLHDL 2.5 05/04/2020 1533   VLDL 39 (H) 03/07/2017 1100   LDLCALC 39 05/04/2020  8   ASCVD risk enhancing conditions: age >18, CKD Does not adhere to provider recommendations re:  does not always adhere to healthy diet, eats fast food at times RN Care Manager Clinical Goal(s):  patient will work with Consulting civil engineer, providers, and care team towards execution of optimized self-health management plan patient will verbalize understanding of plan for maintaining and monitor cholesterol at goal patient will attend all scheduled medical appointments:  follow up with primary care provider. Interventions: Collaboration with [name], MD regarding development and  update of comprehensive plan of care as evidenced by provider attestation and co-signature Inter-disciplinary care team collaboration (see longitudinal plan of care) Reinforced importance of taking medications as prescribed  Inter-disciplinary care team collaboration (see longitudinal plan of care) Evaluation of current treatment plan related to hyperlipidemia and patient's adherence to plan as established by provider. Reinforced plans with patient for ongoing care management follow up and provided patient with direct contact information for care management team Reinforced importance of eliminating high fatty foods from diet Encouraged exercise Sent education via My Chart Heart Healthy Diet Patient Goals/Self-Care Activities: - call for medicine refill 2 or 3 days before it runs out - learn about small changes that will make a big difference - learn my personal risk factors- change to whole grain breads, cereal, pasta - eat smaller or less servings of red meat - fill half the plate with nonstarchy vegetables - get blood test (fasting) done 1 week before next visit - increase the amount of fiber in food - read food labels for fat and fiber - follow up with your doctor as needed  Follow Up Plan: Telephone follow up appointment with care management team member scheduled for:  04/13/2021       Plan:Telephone follow up appointment with care management team member scheduled for:  04/13/2021  Jacqlyn Larsen [facility], BSN RN Case Manager [facility] [phone]

## 2021-02-23 NOTE — Progress Notes (Signed)
Subjective:    Patient ID: [name], male    DOB: [birth year], 75 y.o.   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  He also has prediabetes and his last hemoglobin A1c was 6.1.  Patient saw a podiatrist who felt he was dealing with a Morton's neuroma.  They tried injections which did not seem to help the burning or stinging.  He does not feel that the gabapentin is doing much for the numbness as one would anticipate.  He denies any burning or stinging at the present time.  Is more of numbness and tingling on the dorsums of his feet primarily on his toes distal to the MTP joints.  He notices it most at night.  He has excellent pulses checked at the dorsalis pedis and posterior tibialis pulses bilaterally  Past Medical History:  Diagnosis Date  . Allergy   . Bacteremia due to Gram-negative bacteria 04/13/2015  . Bladder rupture 2010  . Bladder tumor   . Cancer ([place])    bladder  . Chronic kidney disease   . Depression   . Gout   . 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 Hyperlipidemia   . Restless leg syndrome, controlled    Past Surgical History:  Procedure Laterality Date  . APPENDECTOMY    . BLADDER SURGERY  2010  . COLONOSCOPY  06/05/2009  . CYSTOSCOPY W/ RETROGRADES Bilateral 04/21/2013   Procedure: CYSTOSCOPY WITH RETROGRADE PYELOGRAM;  Surgeon: Molli Hazard, MD;  Location: [facility];  Service: Urology;  Laterality: Bilateral;  . FRACTURE SURGERY Right 01/05/2005   lower leg/ankle  . NASAL SEPTUM SURGERY    . TONSILLECTOMY AND ADENOIDECTOMY    . TRANSURETHRAL RESECTION OF BLADDER TUMOR WITH GYRUS (TURBT-GYRUS) N/A 04/21/2013   Procedure: TRANSURETHRAL RESECTION OF BLADDER TUMOR WITH GYRUS (TURBT-GYRUS), cystoscopy;  Surgeon: Molli Hazard, MD;  Location: [facility];  Service: Urology;  Laterality: N/A;   Current Outpatient Medications on File Prior to Visit  Medication Sig Dispense  Refill  . acetaminophen (TYLENOL) 500 MG tablet Take 1 tablet (500 mg total) by mouth every 6 (six) hours as needed for mild pain, moderate pain or headache. 30 tablet 0  . allopurinol (ZYLOPRIM) 300 MG tablet TAKE 1 TABLET BY MOUTH  DAILY 90 tablet 3  . aspirin EC 81 MG tablet Take 81 mg by mouth at bedtime.    . Cholecalciferol (VITAMIN D3 PO) Take by mouth.    . clonazePAM (KLONOPIN) 0.5 MG tablet Take 0.5 mg by mouth 2 (two) times daily as needed.    . colchicine 0.6 MG tablet Take 0.6-1.2 mg by mouth 2 (two) times daily as needed (for gout flare).     [name] escitalopram (LEXAPRO) 20 MG tablet Take 2 tablets (40 mg total) by mouth at bedtime. 180 tablet 3  . ezetimibe (ZETIA) 10 MG tablet TAKE 1 TABLET BY MOUTH IN  THE MORNING 90 tablet 3  . fluticasone (FLONASE) 50 MCG/ACT nasal spray USE 2 SPRAYS IN BOTH  NOSTRILS DAILY 48 g 3  . gabapentin (NEURONTIN) 300 MG capsule TAKE 1 CAPSULE BY MOUTH 3  TIMES DAILY AS NEEDED 270 capsule 3  . mupirocin cream (BACTROBAN) 2 % Apply 1 application topically 2 (two) times daily. 15 g 0  . Omega-3 Fatty Acids (FISH OIL PO) Take by mouth.    [name] omeprazole (PRILOSEC) 20 MG capsule TAKE 1 CAPSULE BY MOUTH  DAILY  90 capsule 3  . rOPINIRole (REQUIP) 4 MG tablet Take 4 mg by mouth at bedtime.     . rosuvastatin (CRESTOR) 5 MG tablet TAKE 1 TABLET BY MOUTH  DAILY 90 tablet 3  . traZODone (DESYREL) 100 MG tablet Take 50 mg by mouth daily as needed for sleep.  (Patient not taking: Reported on 01/05/2021)    . vitamin B-12 (CYANOCOBALAMIN) 1000 MCG tablet Take 1,000 mcg by mouth daily.     No current facility-administered medications on file prior to visit.   Allergies  Allergen Reactions  . Augmentin [Amoxicillin-Pot Clavulanate] Nausea And Vomiting and Other (See Comments)    Has patient had a PCN reaction causing immediate rash, facial/tongue/throat swelling, SOB or lightheadedness with hypotension: No Has patient had a PCN reaction causing severe rash involving  mucus membranes or skin necrosis: No Has patient had a PCN reaction that required hospitalization No Has patient had a PCN reaction occurring within the last 10 years: No If all of the above answers are "NO", then may proceed with Cephalosporin use.  . Statins Other (See Comments)    Reaction:  Muscle pain    Social History   Socioeconomic History  . Marital status: Married    Spouse name: Not on file  . Number of children: Not on file  . Years of education: Not on file  . Highest education level: Not on file  Occupational History  . Not on file  Tobacco Use  . Smoking status: Former    Packs/day: 0.50    Years: 30.00    Pack years: 15.00    Types: Cigarettes    Quit date: 07/24/1989    Years since quitting: 31.6  . Smokeless tobacco: Never  Vaping Use  . Vaping Use: Never used  Substance and Sexual Activity  . Alcohol use: Yes    Comment: social  . Drug use: No  . Sexual activity: Not on file  Other Topics Concern  . 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 Worried About Charity fundraiser in the Last Year: Never true  . Ran Out of Food in the Last Year: Never true  Transportation Needs: No Transportation Needs  . Lack of Transportation (Medical): No  . Lack of Transportation (Non-Medical): No  Physical Activity: Not on file  Stress: Not on file  Social Connections: Not on file  Intimate Partner Violence: Not on file   Family History  Problem Relation Age of Onset  . Cancer Mother        ovarian  . Cirrhosis Father   . Alcohol abuse Father   . Colon cancer Neg Hx   . Colon polyps Neg Hx   . Esophageal cancer Neg Hx   . Rectal cancer Neg Hx   . Stomach cancer Neg Hx      Review of Systems  All other systems reviewed and are negative.     Objective:   Physical Exam Vitals reviewed.   Constitutional:      General: He is not in acute distress.    Appearance: He is well-developed. He is not diaphoretic.  HENT:     Head: Normocephalic and atraumatic.  Neck:     Thyroid: No thyromegaly.     Vascular: No JVD.     Trachea: No  tracheal deviation.  Cardiovascular:     Rate and Rhythm: Normal rate and regular rhythm.     Heart sounds: Normal heart sounds. No murmur heard.   No friction rub. No gallop.  Pulmonary:     Effort: Pulmonary effort is normal. No respiratory distress.     Breath sounds: Normal breath sounds. No stridor. No wheezing or rales.  Chest:     Chest wall: No tenderness.  Musculoskeletal:        General: No tenderness or deformity. Normal range of motion.  Skin:    General: Skin is warm.     Coloration: Skin is not pale.     Findings: No erythema or rash.  Neurological:     Mental Status: He is alert and oriented to person, place, and time.     Cranial Nerves: No cranial nerve deficit.     Motor: No abnormal muscle tone.     Coordination: Coordination normal.     Deep Tendon Reflexes: Reflexes are normal and symmetric.  Psychiatric:        Behavior: Behavior normal.        Thought Content: Thought content normal.        Judgment: Judgment normal.  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        Assessment & Plan:  Prediabetes - Plan: Hemoglobin A1c, CBC with Differential/Platelet, COMPLETE METABOLIC PANEL WITH GFR  123456 deficiency - Plan: Vitamin B12 I believe the patient has peripheral neuropathy most likely due to a combination of his history of chemotherapy coupled with prediabetes coupled with B12 deficiency.  I will repeat a B12 level along with an A1c, CBC, CMP.  I recommended that he hold the gabapentin and see if he notices any worsening of the neuropathy.  I do not want him taking the gabapentin if its not helping.  I would expect the gabapentin to do much in the way of helping with numbness.

## 2021-02-24 LAB — CBC WITH DIFFERENTIAL/PLATELET
Absolute Monocytes: 391 cells/uL (ref 200–950)
Basophils Absolute: 50 cells/uL (ref 0–200)
Basophils Relative: 0.8 %
Eosinophils Absolute: 87 cells/uL (ref 15–500)
Eosinophils Relative: 1.4 %
HCT: 41.3 % (ref 38.5–50.0)
Hemoglobin: 13.8 g/dL (ref 13.2–17.1)
Lymphs Abs: 1364 cells/uL (ref 850–3900)
MCH: 30.9 pg (ref 27.0–33.0)
MCHC: 33.4 g/dL (ref 32.0–36.0)
MCV: 92.4 fL (ref 80.0–100.0)
MPV: 9.7 fL (ref 7.5–12.5)
Monocytes Relative: 6.3 %
Neutro Abs: 4309 cells/uL (ref 1500–7800)
Neutrophils Relative %: 69.5 %
Platelets: 223 10*3/uL (ref 140–400)
RBC: 4.47 10*6/uL (ref 4.20–5.80)
RDW: 13.5 % (ref 11.0–15.0)
Total Lymphocyte: 22 %
WBC: 6.2 10*3/uL (ref 3.8–10.8)

## 2021-02-24 LAB — HEMOGLOBIN A1C
Hgb A1c MFr Bld: 6.1 % of total Hgb — ABNORMAL HIGH (ref <5.7)
Mean Plasma Glucose: 128 mg/dL
eAG (mmol/L): 7.1 mmol/L

## 2021-02-24 LAB — COMPLETE METABOLIC PANEL WITH GFR
AG Ratio: 1.5 (calc) (ref 1.0–2.5)
ALT: 18 U/L (ref 9–46)
AST: 19 U/L (ref 10–35)
Albumin: 4.1 g/dL (ref 3.6–5.1)
Alkaline phosphatase (APISO): 104 U/L (ref 35–144)
BUN/Creatinine Ratio: 15 (calc) (ref 6–22)
BUN: 28 mg/dL — ABNORMAL HIGH (ref 7–25)
CO2: 24 mmol/L (ref 20–32)
Calcium: 9.1 mg/dL (ref 8.6–10.3)
Chloride: 105 mmol/L (ref 98–110)
Creat: 1.85 mg/dL — ABNORMAL HIGH (ref 0.70–1.28)
Globulin: 2.8 g/dL (calc) (ref 1.9–3.7)
Glucose, Bld: 107 mg/dL — ABNORMAL HIGH (ref 65–99)
Potassium: 4.8 mmol/L (ref 3.5–5.3)
Sodium: 137 mmol/L (ref 135–146)
Total Bilirubin: 0.5 mg/dL (ref 0.2–1.2)
Total Protein: 6.9 g/dL (ref 6.1–8.1)
eGFR: 38 mL/min/{1.73_m2} — ABNORMAL LOW (ref >=60)

## 2021-02-24 LAB — VITAMIN B12: Vitamin B-12: 1006 pg/mL (ref 200–1100)

## 2021-03-07 ENCOUNTER — Other Ambulatory Visit: Payer: Self-pay | Admitting: Podiatry

## 2021-03-07 DIAGNOSIS — G5762 Lesion of plantar nerve, left lower limb: Secondary | ICD-10-CM

## 2021-03-07 DIAGNOSIS — G5761 Lesion of plantar nerve, right lower limb: Secondary | ICD-10-CM

## 2021-03-27 ENCOUNTER — Other Ambulatory Visit: Payer: Self-pay | Admitting: Family Medicine

## 2021-03-28 ENCOUNTER — Ambulatory Visit (INDEPENDENT_AMBULATORY_CARE_PROVIDER_SITE_OTHER): Payer: Medicare Other | Admitting: Podiatry

## 2021-03-28 ENCOUNTER — Other Ambulatory Visit: Payer: Self-pay

## 2021-03-28 DIAGNOSIS — G5761 Lesion of plantar nerve, right lower limb: Secondary | ICD-10-CM

## 2021-03-28 DIAGNOSIS — Z01818 Encounter for other preprocedural examination: Secondary | ICD-10-CM

## 2021-03-28 DIAGNOSIS — G5762 Lesion of plantar nerve, left lower limb: Secondary | ICD-10-CM

## 2021-03-29 ENCOUNTER — Encounter: Payer: Self-pay | Admitting: Podiatry

## 2021-03-29 NOTE — Progress Notes (Signed)
Subjective:  Patient ID: [name], male    DOB: [birth year],  MRN: [id number]  Chief Complaint  Patient presents with   Foot Pain    PT stated that he still has some pain and tingling sensation     75 y.o. male presents with the above complaint.  Patient presents for follow-up of bilateral second digit/interspace neuroma.  Patient states that he still has tingling and burning sensation in the toes.  He still feels a neuroma in both interspaces especially with ambulating.  It feels like a pebble.  He denies any other acute complaints he would like to discuss surgical options/treatment options   Review of Systems: Negative except as noted in the HPI. Denies N/V/F/Ch.  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 , Disp: , Rfl:    escitalopram (LEXAPRO) 20 MG tablet, Take 2 tablets (40 mg total) by mouth at bedtime., Disp: 180 tablet, Rfl: 3   ezetimibe (ZETIA) 10 MG tablet, TAKE 1 TABLET BY MOUTH IN  THE MORNING, Disp: 90 tablet, Rfl: 3   fluticasone (FLONASE) 50 MCG/ACT nasal spray, USE 2 SPRAYS IN BOTH  NOSTRILS DAILY, Disp: 48 g, Rfl: 3   gabapentin (NEURONTIN) 300 MG capsule, TAKE 1 CAPSULE BY MOUTH 3  TIMES DAILY AS NEEDED, Disp: 270 capsule, Rfl: 3   omeprazole (PRILOSEC) 20 MG capsule, TAKE 1 CAPSULE BY MOUTH  DAILY, Disp: 90 capsule, Rfl: 3   rOPINIRole (REQUIP) 4 MG  tablet, Take 4 mg by mouth at bedtime. , Disp: , Rfl:    rosuvastatin (CRESTOR) 5 MG tablet, TAKE 1 TABLET BY MOUTH  DAILY, Disp: 90 tablet, Rfl: 3   traZODone (DESYREL) 100 MG tablet, Take 50 mg by mouth daily as needed for sleep., Disp: , Rfl:    vitamin B-12 (CYANOCOBALAMIN) 1000 MCG tablet, Take 1,000 mcg by mouth daily., Disp: , Rfl:   Social History   Tobacco Use  Smoking Status Former   Packs/day: 0.50   Years: 30.00   Pack years: 15.00   Types: Cigarettes   Quit date: 07/24/1989   Years since quitting: 31.7  Smokeless Tobacco Never    Allergies  Allergen Reactions   Augmentin [Amoxicillin-Pot Clavulanate] Nausea And Vomiting and Other (See Comments)    Has patient had a PCN reaction causing immediate rash, facial/tongue/throat swelling, SOB or lightheadedness with hypotension: No Has patient had a PCN reaction causing severe rash involving mucus membranes or skin necrosis: No Has patient had a PCN reaction that required hospitalization No Has patient had a PCN reaction occurring within the last 10 years: No If all of the above answers are "NO", then may proceed with Cephalosporin use.   Statins Other (See Comments)    Reaction:  Muscle  pain    Objective:  There were no vitals filed for this visit. There is no height or weight on file to calculate BMI. Constitutional Well developed. Well nourished.  Vascular Dorsalis pedis pulses palpable bilaterally. Posterior tibial pulses palpable bilaterally. Capillary refill normal to all digits.  No cyanosis or clubbing noted. Pedal hair growth normal.  Neurologic Normal speech. Oriented to person, place, and time. Epicritic sensation to light touch grossly present bilaterally.  Dermatologic Nails well groomed and normal in appearance. No open wounds. No skin lesions.  Orthopedic: Bilateral second interspace neuromas.  Positive Mulder's click noted.  Tingling and numbness noted to the digits.  Negative neuromas to other  interspaces.  No pain with range of motion of second and third metatarsophalangeal joint   Radiographs: 3 views of skeletally mature adult bilateral foot: No bony abnormalities identified.  Mild case of [place] sign noted.  No intermetatarsal space decreasing noted. Assessment:   1. 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 Neuroma of second interspace of left foot   3. Preoperative examination     Plan:  Patient was evaluated and treated and all questions answered.  Bilateral second interspace neuroma -Clinically he did have some relief from diagnostic block in both interspaces.  However given that there is still tingling in the toes and burning and painful with ambulation especially when going barefooted he would like to discuss treatment options for this.  I discussed sclerosing alcohol injection versus surgical excision.  I discussed this with both in extensive detail I discussed shoe gear modification with padding as well.  At this time patient would like to discuss surgical removal of both neuromas.  I discussed my preoperative intraoperative postoperative plan for excision of bilateral second interspace neuroma.  Patient states understanding would like to proceed with the surgery.  He will be weightbearing as tolerated in surgical shoe on the by bilateral side -Informed surgical risk consent was reviewed and read aloud to the patient.  I reviewed the films.  I have discussed my findings with the patient in great detail.  I have discussed all risks including but not limited to infection, stiffness, scarring, limp, disability, deformity, damage to blood vessels and nerves, numbness, poor healing, need for braces, arthritis, chronic pain, amputation, death.  All benefits and realistic expectations discussed in great detail.  I have made no promises as to the outcome.  I have provided realistic expectations.  I have offered the patient a 2nd opinion, which they have declined and  assured me they preferred to proceed despite the risks    No follow-ups on file.

## 2021-03-30 ENCOUNTER — Other Ambulatory Visit: Payer: Self-pay | Admitting: Family Medicine

## 2021-04-13 ENCOUNTER — Ambulatory Visit (INDEPENDENT_AMBULATORY_CARE_PROVIDER_SITE_OTHER): Payer: Medicare Other | Admitting: *Deleted

## 2021-04-13 DIAGNOSIS — E785 Hyperlipidemia, unspecified: Secondary | ICD-10-CM

## 2021-04-13 DIAGNOSIS — N1831 Chronic kidney disease, stage 3a: Secondary | ICD-10-CM

## 2021-04-13 NOTE — Patient Instructions (Signed)
Visit Information  PATIENT GOALS:  Goals Addressed             This Visit's Progress    Follow My Treatment Plan-Chronic Kidney       Timeframe:  Long-Range Goal Priority:  High Start Date:     01/05/2021                        Expected End Date:       07/08/2021                Follow Up Date- 06/25/2021  - keep follow-up appointments- Boneta Lucks 11/23 and 12/19  (post surgery 10/31) - take medications as prescribed - continue to exercise/ walk your dog - drink adequate water - keep blood pressure within normal limits - contact RN care manager for questions [phone]   Why is this important?   Staying as healthy as you can is very important. This may mean making changes if you smoke, don't exercise or eat poorly.  A healthy lifestyle is an important goal for you.  Following the treatment plan and making changes may be hard.  Try some of these steps to help keep the disease from getting worse.     Notes:      Manage My Cholesterol       Timeframe:  Long-Range Goal Priority:  Medium Start Date:       01/05/2021                      Expected End Date:      07/08/2021                 Follow Up Date- 06/25/2021   - eat whole grain breads, cereal, pasta, foods high in fiber - eat smaller or less servings of red meat - practice portion control - fill half the plate with nonstarchy vegetables - read food labels for fat and fiber - follow up with your doctor as needed  - take all medications as prescribed   Why is this important?   Changing cholesterol starts with eating heart-healthy foods.  Other steps may be to increase your activity and to quit if you smoke.    Notes:         Patient verbalizes understanding of instructions provided today and agrees to view in [place].   Telephone follow up appointment with care management team member scheduled for:   Jacqlyn Larsen [facility], BSN RN Case Manager [facility] [phone]

## 2021-04-13 NOTE — Chronic Care Management (AMB) (Signed)
Chronic Care Management   CCM RN Visit Note  04/13/2021 Name: [name] MRN: [id number] DOB: 08/07/1945  Subjective: [name] is a 75 y.o. year old male who is a primary care patient of [name], Austin Mcgee, MD. The care management team was consulted for assistance with disease management and care coordination needs.    Engaged with patient by telephone for follow up visit in response to provider referral for case management and/or care coordination services.   Consent to Services:  The patient was given information about Chronic Care Management services, agreed to services, and gave verbal consent prior to initiation of services.  Please see initial visit note for detailed documentation.   Patient agreed to services and verbal consent obtained.   Assessment: Review of patient past medical history, allergies, medications, health status, including review of consultants reports, laboratory and other test data, was performed as part of comprehensive evaluation and provision of chronic care management services.   SDOH (Social Determinants of Health) assessments and interventions performed:    CCM Care Plan  Allergies  Allergen Reactions   Augmentin [Amoxicillin-Pot Clavulanate] Nausea And Vomiting and Other (See Comments)    Has patient had a PCN reaction causing immediate rash, facial/tongue/throat swelling, SOB or lightheadedness with hypotension: No Has patient had a PCN reaction causing severe rash involving mucus membranes or skin necrosis: No Has patient had a PCN reaction that required hospitalization No Has patient had a PCN reaction occurring within the last 10 years: No If all of the above answers are "NO", then may proceed with Cephalosporin use.   Statins Other (See Comments)    Reaction:  Muscle pain     Outpatient Encounter Medications as of 04/13/2021  Medication Sig   allopurinol (ZYLOPRIM) 300 MG tablet TAKE 1 TABLET BY MOUTH  DAILY   clonazePAM (KLONOPIN) 0.5 MG  tablet Take 0.5 mg by mouth 2 (two) times daily as needed.   colchicine 0.6 MG tablet Take 0.6-1.2 mg by mouth 2 (two) times daily as needed (for gout flare).    escitalopram (LEXAPRO) 20 MG tablet Take 2 tablets (40 mg total) by mouth at bedtime.   ezetimibe (ZETIA) 10 MG tablet TAKE 1 TABLET BY MOUTH IN  THE MORNING   fluticasone (FLONASE) 50 MCG/ACT nasal spray USE 2 SPRAYS IN BOTH  NOSTRILS DAILY   gabapentin (NEURONTIN) 300 MG capsule TAKE 1 CAPSULE BY MOUTH 3  TIMES DAILY AS NEEDED   omeprazole (PRILOSEC) 20 MG capsule TAKE 1 CAPSULE BY MOUTH  DAILY   rOPINIRole (REQUIP) 4 MG tablet Take 4 mg by mouth at bedtime.    rosuvastatin (CRESTOR) 5 MG tablet TAKE 1 TABLET BY MOUTH  DAILY   traZODone (DESYREL) 100 MG tablet Take 50 mg by mouth daily as needed for sleep.   vitamin B-12 (CYANOCOBALAMIN) 1000 MCG tablet Take 1,000 mcg by mouth daily.   No facility-administered encounter medications on file as of 04/13/2021.    Patient Active Problem List   Diagnosis Date Noted   MRSA (methicillin resistant staph aureus) culture positive 12/27/2019   UTI (urinary tract infection) 08/24/2016   Fever 08/24/2016   History of nephrectomy 08/24/2016   CKD (chronic kidney disease) stage 3, GFR 30-59 ml/min ([place]) 08/24/2016   Sepsis due to Escherichia coli (E. coli) ([place]) 04/14/2015   Bacteremia due to Gram-negative bacteria ([place]), E. Coli 04/14/2015   UTI (lower urinary tract infection) 04/13/2015   Anemia of chronic kidney failure 04/13/2015   Acute hyponatremia 04/13/2015  Bladder cancer ([place]) 06/23/2013   Hyperlipidemia    GERD (gastroesophageal reflux disease) 12/22/2012    Conditions to be addressed/monitored:HLD and CKD Stage 3  Care Plan : Chronic Kidney (Adult)  Updates made by Austin Mends, RN since 04/13/2021 12:00 AM     Problem: Disease Progression   Priority: High     Long-Range Goal: Disease Progression Prevented or Minimized   Start Date: 01/05/2021  Expected End Date:  07/08/2021  This Visit's Progress: On track  Recent Progress: On track  Priority: High  Note:   Current Barriers:  Ineffective Self Health Maintenance - pt can benefit from education regarding CKD and progression of disease.  Patient reports he had B12 level checked today at primary care provider visit, has been taking Vitamin B12 tablets and reports B12 level within normal limits at present. Patient continues taking gabapentin. Pt reports he has neuroma and has been working with podiatrist and will be having surgery on 05/07/21 as nothing has been effective so far. Clinical Goal(s):  Collaboration with Austin Frizzle, MD regarding development and update of comprehensive plan of care as evidenced by provider attestation and co-signature Inter-disciplinary care team collaboration (see longitudinal plan of care) patient will work with care management team to address care coordination and chronic disease management needs related to Disease Management Educational Needs Care Coordination Medication Management and Education   Interventions:  Evaluation of current treatment plan related to CKD Stage 3,  self-management and patient's adherence to plan as established by provider. Collaboration with Austin Frizzle, MD regarding development and update of comprehensive plan of care as evidenced by provider attestation       and co-signature Inter-disciplinary care team collaboration (see longitudinal plan of care) Reviewed plans with patient for ongoing care management follow up  Reviewed importance of keeping blood pressure within normal limits Reviewed importance of healthy diet and drinking adequate fluids/ water Encouraged pt to get outside daily and importance of exercise Reviewed upcoming scheduled appoinments Reviewed importance of good blood pressure control, healthy diet, adequate fluids  Self Care Activities:  Attends all scheduled provider appointments Attends church or other social  activities Calls provider office for new concerns or questions Patient Goals:- call for medicine refill 2 or 3 days before it runs out - keep follow-up appointments- [name] 11/23 and 12/19  (post surgery 10/31) - take medications as prescribed - continue to exercise/ walk your dog - drink adequate water - keep blood pressure within normal limits - contact RN care manager for questions [phone] Follow Up Plan: Telephone follow up appointment with care management team member scheduled for:  06/25/2021     Care Plan : Hyperlipidemia  Updates made by Austin Mends, RN since 04/13/2021 12:00 AM     Problem: Health Promotion or Disease Self-Management Hyperlipidemia   Priority: Medium     Long-Range Goal: Self-Management Plan Developed Hyperlipidemia   Start Date: 01/05/2021  Expected End Date: 07/08/2021  This Visit's Progress: On track  Recent Progress: On track  Priority: Medium  Note:   Current Barriers:  Maintenance of hyperlipidemia for overall health Current antihyperlipidemic regimen-  zetia/ crestor Most recent lipid panel:     Component Value Date/Time   CHOL 100 05/04/2020 1533   TRIG 125 05/04/2020 1533   HDL 40 05/04/2020 1533   CHOLHDL 2.5 05/04/2020 1533   VLDL 39 (H) 03/07/2017 1100   LDLCALC 39 05/04/2020 1533   ASCVD risk enhancing conditions: age >14, CKD Does not adhere to  provider recommendations re:  does not always adhere to healthy diet, eats fast food at times RN Care Manager Clinical Goal(s):  patient will work with Consulting civil engineer, providers, and care team towards execution of optimized self-health management plan patient will verbalize understanding of plan for maintaining and monitor cholesterol at goal patient will attend all scheduled medical appointments:  follow up with primary care provider. Interventions: Collaboration with Austin Frizzle, MD regarding development and update of comprehensive plan of care as evidenced by provider  attestation and co-signature Inter-disciplinary care team collaboration (see longitudinal plan of care) Reviewed importance of taking medications as prescribed  Inter-disciplinary care team collaboration (see longitudinal plan of care) Reviewed current treatment plan related to hyperlipidemia and patient's adherence to plan as established by provider. Reviewed plans with patient for ongoing care management follow up and provided patient with direct contact information for care management team Reviewed importance of eliminating high fatty foods from diet- bake or broil foods instead of frying Encouraged patient to continue exercising/ walking dog Patient Goals/Self-Care Activities: - eat whole grain breads, cereal, pasta, foods high in fiber - eat smaller or less servings of red meat - practice portion control - fill half the plate with nonstarchy vegetables - read food labels for fat and fiber - follow up with your doctor as needed  - take all medications as prescribed Follow Up Plan: Telephone follow up appointment with care management team member scheduled for:  06/25/2021       Plan:Telephone follow up appointment with care management team member scheduled for:  06/25/2021  Jacqlyn Larsen [facility], BSN RN Case Manager [facility] [phone]

## 2021-04-24 DIAGNOSIS — Z23 Encounter for immunization: Secondary | ICD-10-CM | POA: Diagnosis not present

## 2021-05-07 ENCOUNTER — Other Ambulatory Visit: Payer: Self-pay | Admitting: Podiatry

## 2021-05-07 ENCOUNTER — Encounter: Payer: Self-pay | Admitting: Podiatry

## 2021-05-07 DIAGNOSIS — N1831 Chronic kidney disease, stage 3a: Secondary | ICD-10-CM

## 2021-05-07 DIAGNOSIS — G588 Other specified mononeuropathies: Secondary | ICD-10-CM | POA: Diagnosis not present

## 2021-05-07 DIAGNOSIS — G5763 Lesion of plantar nerve, bilateral lower limbs: Secondary | ICD-10-CM | POA: Diagnosis not present

## 2021-05-07 DIAGNOSIS — E785 Hyperlipidemia, unspecified: Secondary | ICD-10-CM

## 2021-05-07 DIAGNOSIS — G5761 Lesion of plantar nerve, right lower limb: Secondary | ICD-10-CM | POA: Diagnosis not present

## 2021-05-07 DIAGNOSIS — G5762 Lesion of plantar nerve, left lower limb: Secondary | ICD-10-CM | POA: Diagnosis not present

## 2021-05-07 MED ORDER — IBUPROFEN 800 MG PO TABS: 800.0000 mg | ORAL_TABLET | Freq: Four times a day (QID) | ORAL | 1 refills | Status: DC | PRN

## 2021-05-07 MED ORDER — OXYCODONE-ACETAMINOPHEN 5-325 MG PO TABS: 1.0000 | ORAL_TABLET | ORAL | 0 refills | Status: DC | PRN

## 2021-05-10 ENCOUNTER — Other Ambulatory Visit: Payer: Self-pay | Admitting: Family Medicine

## 2021-05-16 ENCOUNTER — Ambulatory Visit (INDEPENDENT_AMBULATORY_CARE_PROVIDER_SITE_OTHER): Payer: Medicare Other | Admitting: Podiatry

## 2021-05-16 ENCOUNTER — Encounter: Payer: Medicare Other | Admitting: Podiatry

## 2021-05-16 ENCOUNTER — Other Ambulatory Visit: Payer: Self-pay

## 2021-05-16 DIAGNOSIS — G5762 Lesion of plantar nerve, left lower limb: Secondary | ICD-10-CM

## 2021-05-16 DIAGNOSIS — G5761 Lesion of plantar nerve, right lower limb: Secondary | ICD-10-CM

## 2021-05-16 DIAGNOSIS — Z9889 Other specified postprocedural states: Secondary | ICD-10-CM

## 2021-05-16 MED ORDER — DOXYCYCLINE HYCLATE 100 MG PO TABS: 100.0000 mg | ORAL_TABLET | Freq: Two times a day (BID) | ORAL | 0 refills | Status: AC

## 2021-05-17 ENCOUNTER — Telehealth: Payer: Self-pay | Admitting: Podiatry

## 2021-05-17 ENCOUNTER — Other Ambulatory Visit: Payer: Self-pay

## 2021-05-17 MED ORDER — DOXYCYCLINE HYCLATE 100 MG PO TABS: 100.0000 mg | ORAL_TABLET | Freq: Two times a day (BID) | ORAL | 0 refills | Status: DC

## 2021-05-17 NOTE — Telephone Encounter (Signed)
Patient called to office stating he called the pharmacy about his prescription was not there   Please advise

## 2021-05-17 NOTE — Telephone Encounter (Signed)
I called the patient to let him know that his RX has been sent

## 2021-05-17 NOTE — Telephone Encounter (Signed)
Patient is unable to pick up new prescription from [facility] since it was sent to [facility]. [facility] would not prescribe [name] his Doxycyline. Please cancel [facility] prescription.

## 2021-05-17 NOTE — Addendum Note (Signed)
Addended by: Boneta Lucks on: 05/17/2021 12:49 PM   Modules accepted: Orders

## 2021-05-18 ENCOUNTER — Encounter: Payer: Self-pay | Admitting: Podiatry

## 2021-05-23 NOTE — Progress Notes (Signed)
Subjective:  Patient ID: [name], male    DOB: [birth year],  MRN: [id number]  Chief Complaint  Patient presents with   Routine Post Op    POV #1 DOS 05/07/2021 B/L EXCISION OF 2ND INTERSPACE NEUROMA    DOS: 05/07/2021 Procedure: Bilateral second interspace neuroma  75 y.o. male returns for post-op check.  Patient states that he is doing well.  He states that he has some pain.  There are some redness associated with it.  He denies any other acute complaints he has been walking a lot on his foot with both of his surgical shoe on.  No nausea fever chills vomiting  Review of Systems: Negative except as noted in the HPI. Denies N/V/F/Ch.  Past Medical History:  Diagnosis Date   Allergy    Bacteremia due to Gram-negative bacteria 04/13/2015   Bladder rupture 2010   Bladder tumor    Cancer [facility])    bladder   Chronic kidney disease    Depression    Gout    History of unilateral nephrectomy 07/2014   [facility]   Hyperlipidemia    Restless leg syndrome, controlled     Current Outpatient Medications:    doxycycline (VIBRA-TABS) 100 MG tablet, Take 1 tablet (100 mg total) by mouth 2 (two) times daily for 10 days., Disp: 20 tablet, Rfl: 0   allopurinol (ZYLOPRIM) 300 MG tablet, TAKE 1 TABLET BY MOUTH  DAILY, Disp: 90 tablet, Rfl: 3   clonazePAM (KLONOPIN) 0.5 MG tablet, Take 0.5 mg by mouth 2 (two) times daily as needed., Disp: , Rfl:    colchicine 0.6 MG tablet, Take 0.6-1.2 mg by mouth 2 (two) times daily as needed (for gout flare). , Disp: , Rfl:    doxycycline (VIBRA-TABS) 100 MG tablet, Take 1 tablet (100 mg total) by mouth 2 (two) times daily., Disp: 20 tablet, Rfl: 0   escitalopram (LEXAPRO) 20 MG tablet, Take 2 tablets (40 mg total) by mouth at bedtime., Disp: 180 tablet, Rfl: 3   ezetimibe (ZETIA) 10 MG tablet, TAKE 1 TABLET BY MOUTH IN  THE MORNING, Disp: 90 tablet, Rfl: 3   fluticasone (FLONASE) 50 MCG/ACT nasal spray, USE 2 SPRAYS IN BOTH  NOSTRILS DAILY, Disp: 48 g, Rfl: 3    gabapentin (NEURONTIN) 300 MG capsule, TAKE 1 CAPSULE BY MOUTH 3  TIMES DAILY AS NEEDED, Disp: 270 capsule, Rfl: 3   ibuprofen (ADVIL) 800 MG tablet, Take 1 tablet (800 mg total) by mouth every 6 (six) hours as needed., Disp: 60 tablet, Rfl: 1   omeprazole (PRILOSEC) 20 MG capsule, TAKE 1 CAPSULE BY MOUTH  DAILY, Disp: 90 capsule, Rfl: 3   oxyCODONE-acetaminophen (PERCOCET) 5-325 MG tablet, Take 1 tablet by mouth every 4 (four) hours as needed for severe pain., Disp: 30 tablet, Rfl: 0   rOPINIRole (REQUIP) 4 MG tablet, Take 4 mg by mouth at bedtime. , Disp: , Rfl:    rosuvastatin (CRESTOR) 5 MG tablet, TAKE 1 TABLET BY MOUTH  DAILY, Disp: 90 tablet, Rfl: 3   traZODone (DESYREL) 100 MG tablet, Take 50 mg by mouth daily as needed for sleep., Disp: , Rfl:    vitamin B-12 (CYANOCOBALAMIN) 1000 MCG tablet, Take 1,000 mcg by mouth daily., Disp: , Rfl:   Social History   Tobacco Use  Smoking Status Former   Packs/day: 0.50   Years: 30.00   Pack years: 15.00   Types: Cigarettes   Quit date: 07/24/1989   Years since quitting: 31.8  Smokeless Tobacco Never  Allergies  Allergen Reactions   Augmentin [Amoxicillin-Pot Clavulanate] Nausea And Vomiting and Other (See Comments)    Has patient had a PCN reaction causing immediate rash, facial/tongue/throat swelling, SOB or lightheadedness with hypotension: No Has patient had a PCN reaction causing severe rash involving mucus membranes or skin necrosis: No Has patient had a PCN reaction that required hospitalization No Has patient had a PCN reaction occurring within the last 10 years: No If all of the above answers are "NO", then may proceed with Cephalosporin use.   Statins Other (See Comments)    Reaction:  Muscle pain    Objective:  There were no vitals filed for this visit. There is no height or weight on file to calculate BMI. Constitutional Well developed. Well nourished.  Vascular Foot warm and well perfused. Capillary refill normal to  all digits.   Neurologic Normal speech. Oriented to person, place, and time. Epicritic sensation to light touch grossly present bilaterally.  Dermatologic Skin healing well without signs of infection. Skin edges well coapted without signs of infection.  Mild redness noted around incision site  Orthopedic: Tenderness to palpation noted about the surgical site.   Radiographs: None Assessment:   1. 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 Neuroma of second interspace of left foot   3. Status post foot surgery    Plan:  Patient was evaluated and treated and all questions answered.  S/p foot surgery bilaterally -Progressing as expected post-operatively. -XR: None -WB Status: Weightbearing as tolerated in surgical shoe -Sutures: Intact.  No clinical signs of dehiscence noted no complication noted. -Medications: Doxycycline for skin and soft tissue prophylaxis -Foot redressed.  No follow-ups on file.

## 2021-05-30 ENCOUNTER — Other Ambulatory Visit: Payer: Self-pay

## 2021-05-30 ENCOUNTER — Ambulatory Visit (INDEPENDENT_AMBULATORY_CARE_PROVIDER_SITE_OTHER): Payer: Medicare Other | Admitting: Podiatry

## 2021-05-30 DIAGNOSIS — Z9889 Other specified postprocedural states: Secondary | ICD-10-CM

## 2021-05-30 DIAGNOSIS — G5761 Lesion of plantar nerve, right lower limb: Secondary | ICD-10-CM

## 2021-05-30 DIAGNOSIS — G5762 Lesion of plantar nerve, left lower limb: Secondary | ICD-10-CM

## 2021-06-05 NOTE — Progress Notes (Signed)
Subjective:  Patient ID: [name], male    DOB: 24-Apr-1946,  MRN: [id number]  Chief Complaint  Patient presents with   Routine Post Op     POV #2 DOS 05/07/2021 B/L EXCISION OF 2ND INTERSPACE NEUROMA    DOS: 05/07/2021 Procedure: Bilateral second interspace neuroma  75 y.o. male returns for post-op check.  Patient states that he is doing well.  He states that he has some pain.  There are some redness associated with it.  He denies any other acute complaints he has been walking a lot on his foot with both of his surgical shoe on.  No nausea fever chills vomiting  Review of Systems: Negative except as noted in the HPI. Denies N/V/F/Ch.  Past Medical History:  Diagnosis Date   Allergy    Bacteremia due to Gram-negative bacteria 04/13/2015   Bladder rupture 2010   Bladder tumor    Cancer [facility])    bladder   Chronic kidney disease    Depression    Gout    History of unilateral nephrectomy 07/2014   [facility]   Hyperlipidemia    Restless leg syndrome, controlled     Current Outpatient Medications:    allopurinol (ZYLOPRIM) 300 MG tablet, TAKE 1 TABLET BY MOUTH  DAILY, Disp: 90 tablet, Rfl: 3   clonazePAM (KLONOPIN) 0.5 MG tablet, Take 0.5 mg by mouth 2 (two) times daily as needed., Disp: , Rfl:    colchicine 0.6 MG tablet, Take 0.6-1.2 mg by mouth 2 (two) times daily as needed (for gout flare). , Disp: , Rfl:    doxycycline (VIBRA-TABS) 100 MG tablet, Take 1 tablet (100 mg total) by mouth 2 (two) times daily., Disp: 20 tablet, Rfl: 0   escitalopram (LEXAPRO) 20 MG tablet, Take 2 tablets (40 mg total) by mouth at bedtime., Disp: 180 tablet, Rfl: 3   ezetimibe (ZETIA) 10 MG tablet, TAKE 1 TABLET BY MOUTH IN  THE MORNING, Disp: 90 tablet, Rfl: 3   fluticasone (FLONASE) 50 MCG/ACT nasal spray, USE 2 SPRAYS IN BOTH  NOSTRILS DAILY, Disp: 48 g, Rfl: 3   gabapentin (NEURONTIN) 300 MG capsule, TAKE 1 CAPSULE BY MOUTH 3  TIMES DAILY AS NEEDED, Disp: 270 capsule, Rfl: 3   ibuprofen (ADVIL) 800  MG tablet, Take 1 tablet (800 mg total) by mouth every 6 (six) hours as needed., Disp: 60 tablet, Rfl: 1   omeprazole (PRILOSEC) 20 MG capsule, TAKE 1 CAPSULE BY MOUTH  DAILY, Disp: 90 capsule, Rfl: 3   oxyCODONE-acetaminophen (PERCOCET) 5-325 MG tablet, Take 1 tablet by mouth every 4 (four) hours as needed for severe pain., Disp: 30 tablet, Rfl: 0   rOPINIRole (REQUIP) 4 MG tablet, Take 4 mg by mouth at bedtime. , Disp: , Rfl:    rosuvastatin (CRESTOR) 5 MG tablet, TAKE 1 TABLET BY MOUTH  DAILY, Disp: 90 tablet, Rfl: 3   traZODone (DESYREL) 100 MG tablet, Take 50 mg by mouth daily as needed for sleep., Disp: , Rfl:    vitamin B-12 (CYANOCOBALAMIN) 1000 MCG tablet, Take 1,000 mcg by mouth daily., Disp: , Rfl:   Social History   Tobacco Use  Smoking Status Former   Packs/day: 0.50   Years: 30.00   Pack years: 15.00   Types: Cigarettes   Quit date: 07/24/1989   Years since quitting: 31.8  Smokeless Tobacco Never    Allergies  Allergen Reactions   Augmentin [Amoxicillin-Pot Clavulanate] Nausea And Vomiting and Other (See Comments)    Has patient had a  PCN reaction causing immediate rash, facial/tongue/throat swelling, SOB or lightheadedness with hypotension: No Has patient had a PCN reaction causing severe rash involving mucus membranes or skin necrosis: No Has patient had a PCN reaction that required hospitalization No Has patient had a PCN reaction occurring within the last 10 years: No If all of the above answers are "NO", then may proceed with Cephalosporin use.   Statins Other (See Comments)    Reaction:  Muscle pain    Objective:  There were no vitals filed for this visit. There is no height or weight on file to calculate BMI. Constitutional Well developed. Well nourished.  Vascular Foot warm and well perfused. Capillary refill normal to all digits.   Neurologic Normal speech. Oriented to person, place, and time. Epicritic sensation to light touch grossly present  bilaterally.  Dermatologic Skin completely epithelialized.  No dehiscence noted.  Good correction alignment noted.  Orthopedic: Tenderness to palpation noted about the surgical site.   Radiographs: None Assessment:   1. 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 Neuroma of second interspace of left foot   3. Status post foot surgery     Plan:  Patient was evaluated and treated and all questions answered.  S/p foot surgery bilaterally -Progressing as expected post-operatively. -XR: None -WB Status: Weightbearing as tolerated in surgical shoe -Sutures: Removed no clinical signs of dehiscence noted no complication noted. -Medications: None -Foot redressed.  No follow-ups on file.

## 2021-06-20 ENCOUNTER — Other Ambulatory Visit: Payer: Self-pay | Admitting: Family Medicine

## 2021-06-21 ENCOUNTER — Other Ambulatory Visit: Payer: Self-pay

## 2021-06-21 ENCOUNTER — Ambulatory Visit (INDEPENDENT_AMBULATORY_CARE_PROVIDER_SITE_OTHER): Payer: Medicare Other

## 2021-06-21 VITALS — BP 128/70 | HR 63 | Ht 69.0 in | Wt 217.0 lb

## 2021-06-21 DIAGNOSIS — Z0000 Encounter for general adult medical examination without abnormal findings: Secondary | ICD-10-CM | POA: Diagnosis not present

## 2021-06-21 NOTE — Progress Notes (Signed)
Subjective:   [name] is a 75 y.o. male who presents for Medicare Annual/Subsequent preventive examination.  Review of Systems     Cardiac Risk Factors include: advanced age (>68men, >31 women);dyslipidemia;male gender;sedentary lifestyle;obesity (BMI >30kg/m2)     Objective:    Today's Vitals   06/21/21 1510  BP: 128/70  Pulse: 63  SpO2: 99%  Weight: 217 lb (98.4 kg)  Height: 5\' 9"  (1.753 m)   Body mass index is 32.05 kg/m.  Advanced Directives 06/21/2021 01/05/2021 05/04/2020 05/11/2018 08/25/2016 08/24/2016 04/13/2015  Does Patient Have a Medical Advance Directive? Yes Yes Yes No No No Yes  Type of Paramedic of [place];Living will [place];Living will [place];Living will;Out of facility DNR (pink MOST or yellow form) - - - Press photographer  Does patient want to make changes to medical advance directive? - No - Patient declined - - - - No - Patient declined  Copy of [place] in Chart? No - copy requested No - copy requested - - - - No - copy requested  Would patient like information on creating a medical advance directive? No - Patient declined - - No - Patient declined - - -  Pre-existing out of facility DNR order (yellow form or pink MOST form) - - - - - - -    Current Medications (verified) Outpatient Encounter Medications as of 06/21/2021  Medication Sig   allopurinol (ZYLOPRIM) 300 MG tablet TAKE 1 TABLET BY MOUTH  DAILY   clonazePAM (KLONOPIN) 0.5 MG tablet Take 0.5 mg by mouth 2 (two) times daily as needed.   colchicine 0.6 MG tablet Take 0.6-1.2 mg by mouth 2 (two) times daily as needed (for gout flare).    escitalopram (LEXAPRO) 20 MG tablet Take 2 tablets (40 mg total) by mouth at bedtime.   ezetimibe (ZETIA) 10 MG tablet TAKE 1 TABLET BY MOUTH IN  THE MORNING   fluticasone (FLONASE) 50 MCG/ACT nasal spray USE 2 SPRAYS IN BOTH  NOSTRILS DAILY   gabapentin  (NEURONTIN) 300 MG capsule TAKE 1 CAPSULE BY MOUTH 3  TIMES DAILY AS NEEDED   ibuprofen (ADVIL) 800 MG tablet Take 1 tablet (800 mg total) by mouth every 6 (six) hours as needed.   omeprazole (PRILOSEC) 20 MG capsule TAKE 1 CAPSULE BY MOUTH  DAILY   rOPINIRole (REQUIP) 4 MG tablet Take 4 mg by mouth at bedtime.    rosuvastatin (CRESTOR) 5 MG tablet TAKE 1 TABLET BY MOUTH  DAILY   traZODone (DESYREL) 100 MG tablet Take 50 mg by mouth daily as needed for sleep.   vitamin B-12 (CYANOCOBALAMIN) 1000 MCG tablet Take 1,000 mcg by mouth daily.   doxycycline (VIBRA-TABS) 100 MG tablet Take 1 tablet (100 mg total) by mouth 2 (two) times daily.   oxyCODONE-acetaminophen (PERCOCET) 5-325 MG tablet Take 1 tablet by mouth every 4 (four) hours as needed for severe pain.   No facility-administered encounter medications on file as of 06/21/2021.    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   [facility]   Hyperlipidemia    Restless leg syndrome,  controlled    Past Surgical History:  Procedure Laterality Date   APPENDECTOMY     BLADDER SURGERY  30-Sep-2008   COLONOSCOPY  06/05/2009   CYSTOSCOPY W/ RETROGRADES Bilateral 04/21/2013   Procedure: CYSTOSCOPY WITH RETROGRADE PYELOGRAM;  Surgeon: Molli Hazard, MD;  Location: [facility];  Service: Urology;  Laterality: Bilateral;   FRACTURE SURGERY Right 01/05/2005   lower leg/ankle   NASAL SEPTUM SURGERY     TONSILLECTOMY AND ADENOIDECTOMY     TRANSURETHRAL RESECTION OF BLADDER TUMOR WITH GYRUS (TURBT-GYRUS) N/A 04/21/2013   Procedure: TRANSURETHRAL RESECTION OF BLADDER TUMOR WITH GYRUS (TURBT-GYRUS), cystoscopy;  Surgeon: Molli Hazard, MD;  Location:  [facility];  Service: Urology;  Laterality: N/A;   Family History  Problem Relation Age of Onset   Cancer Mother        ovarian   Cirrhosis Father    Alcohol abuse Father    Colon cancer Neg Hx    Colon polyps Neg Hx    Esophageal cancer Neg Hx    Rectal cancer Neg Hx    Stomach cancer Neg Hx    Social History   Socioeconomic History   Marital status: Married    Spouse name: Velva Harman   Number of children: 2   Years of education: Not on file   Highest education level: Not on file  Occupational History   Not on file  Tobacco Use   Smoking status: Former    Packs/day: 0.50    Years: 30.00    Pack years: 15.00    Types: Cigarettes    Quit date: 07/24/1989    Years since quitting: 31.9   Smokeless tobacco: Never  Vaping Use   Vaping Use: Never used  Substance and Sexual Activity   Alcohol use: Yes    Comment: social   Drug use: No   Sexual activity: Not on file  Other Topics Concern   Not on file  Social History Narrative   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   1 son died in 09-30-2016.   1 son living.   1 granddaughter.   Social Determinants of Health   Financial Resource Strain: Low Risk    Difficulty of Paying Living Expenses: Not hard at all  Food Insecurity: No Food Insecurity   Worried About Charity fundraiser in the Last Year: Never true   [place] in the Last Year: Never true  Transportation Needs: No Transportation Needs   Lack of Transportation (Medical): No   Lack of Transportation (Non-Medical): No  Physical Activity: Insufficiently Active   Days of Exercise per Week: 3 days   Minutes of Exercise per Session: 30 min  Stress: No Stress Concern Present   Feeling of Stress : Only a little  Social Connections: Engineer, building services of Communication with Friends and Family: More than three times a week   Frequency of Social Gatherings with Friends and Family: More than three times a week   Attends Religious Services: More than 4 times  per year   Active Member of [employer] or Organizations: Yes   Attends Music therapist: More than 4 times per year   Marital Status: Married    Tobacco Counseling Counseling given: Not Answered   Clinical Intake:  Pre-visit preparation completed: Yes  Pain : No/denies pain     BMI - recorded: 32.05 Nutritional Status: BMI > 30  Obese Diabetes: No  How often do you need to have someone  help you when you read instructions, pamphlets, or other written materials from your doctor or pharmacy?: 1 - Never  Diabetic?No  Interpreter Needed?: No  Information entered by :: mj Greogory Cornette, lpn   Activities of Daily Living In your present state of health, do you have any difficulty performing the following activities: 06/21/2021 06/21/2021  Hearing? N N  Vision? N N  Difficulty concentrating or making decisions? N N  Walking or climbing stairs? N N  Dressing or bathing? N N  Doing errands, shopping? N N  Preparing Food and eating ? N N  Using the Toilet? N N  In the past six months, have you accidently leaked urine? N N  Comment Pt has ostomy bag. -  Do you have problems with loss of bowel control? N N  Managing your Medications? N N  Managing your Finances? N N  Housekeeping or managing your Housekeeping? N N  Some recent data might be hidden    Patient Care Team: [name], MD as PCP - General (Family Medicine) Kassie Mends, RN as [place] any recent [place] you may have received from other than [facility] providers in the past year (date may be approximate).     Assessment:   This is a routine wellness examination for [place].  Hearing/Vision screen Hearing Screening - Comments:: No hearing issues.  Vision Screening - Comments:: Glasses. [facility] 2021  Dietary issues and exercise activities discussed: Current Exercise Habits: Home exercise routine, Type of exercise: walking, Time (Minutes): 30,  Frequency (Times/Week): 3, Weekly Exercise (Minutes/Week): 90, Intensity: Mild, Exercise limited by: cardiac condition(s)   Goals Addressed             This Visit's Progress    Exercise 3x per week (30 min per time)       Exercise, diet and lose weight.       Depression Screen PHQ 2/9 Scores 06/21/2021 02/23/2021 01/05/2021 05/04/2020 04/29/2019 09/04/2018 04/06/2018  PHQ - 2 Score 1 0 0 0 0 0 0  PHQ- 9 Score - - - - - - -    Fall Risk Fall Risk  06/21/2021 06/21/2021 02/23/2021 01/05/2021 05/04/2020  Falls in the past year? 0 0 0 0 -  Comment - - - - -  Number falls in past yr: 0 0 0 - 0  Injury with Fall? 0 0 0 - 1  Risk for fall due to : No Fall Risks - No Fall Risks - -  Follow up Falls prevention discussed - Falls evaluation completed - -    FALL RISK PREVENTION PERTAINING TO THE HOME:  Any stairs in or around the home? Yes  If so, are there any without handrails? No  Home free of loose throw rugs in walkways, pet beds, electrical cords, etc? Yes  Adequate lighting in your home to reduce risk of falls? Yes   ASSISTIVE DEVICES UTILIZED TO PREVENT FALLS:  Life alert? No  Use of a cane, walker or w/c? No  Grab bars in the bathroom? Yes  Shower chair or bench in shower? Yes  Elevated toilet seat or a handicapped toilet? Yes   TIMED UP AND GO:  Was the test performed? Yes .  Length of time to ambulate 10 feet: 8 sec.   Gait steady and fast without use of assistive device  Cognitive Function: Normal cognitive status assessed by direct observation by this Nurse Health Advisor. No abnormalities found.  6CIT Screen 05/04/2020  What Year? 0 points  What month? 0 points  What time? 0 points  Count back from 20 0 points  Months in reverse 0 points  Repeat phrase 0 points  Total Score 0    Immunizations Immunization History  Administered Date(s) Administered   Fluad Quad(high Dose 65+) 04/29/2019, 04/27/2020   Influenza Split 05/25/2013   Influenza,inj,Quad  PF,6+ Mos 04/22/2014, 04/28/2015, 05/09/2016, 03/07/2017, 03/03/2018   PFIZER(Purple Top)SARS-COV-2 Vaccination 09/01/2019, 09/29/2019   Pneumococcal Conjugate-13 04/22/2014   Pneumococcal Polysaccharide-23 04/28/2015   Tdap 04/26/2019   Zoster, Live 01/27/2009    TDAP status: Up to date  Flu Vaccine status: Up to date  Pneumococcal vaccine status: Up to date  Covid-19 vaccine status: Completed vaccines  Qualifies for Shingles Vaccine? Yes   Zostavax completed Yes   Shingrix Completed?: No.    Education has been provided regarding the importance of this vaccine. Patient has been advised to call insurance company to determine out of pocket expense if they have not yet received this vaccine. Advised may also receive vaccine at local pharmacy or Health Dept. Verbalized acceptance and understanding.  Screening Tests Health Maintenance  Topic Date Due   Zoster Vaccines- Shingrix (1 of 2) Never done   COVID-19 Vaccine (3 - Pfizer risk series) 10/27/2019   INFLUENZA VACCINE  02/05/2021   TETANUS/TDAP  04/25/2029   COLONOSCOPY (Pts 45-94yrs Insurance coverage will need to be confirmed)  06/29/2029   Pneumonia Vaccine 24+ Years old  Completed   Hepatitis C Screening  Completed   HPV VACCINES  Aged Out    Health Maintenance  Health Maintenance Due  Topic Date Due   Zoster Vaccines- Shingrix (1 of 2) Never done   COVID-19 Vaccine (3 - Pfizer risk series) 10/27/2019   INFLUENZA VACCINE  02/05/2021    Colorectal cancer screening: Type of screening: Colonoscopy. Completed 06/30/2019. Repeat every 10 years  Lung Cancer Screening: (Low Dose CT Chest recommended if Age 33-80 years, 30 pack-year currently smoking OR have quit w/in 15years.) does not qualify.    Additional Screening:  Hepatitis C Screening: does qualify; Completed 03/07/2017  Vision Screening: Recommended annual ophthalmology exams for early detection of glaucoma and other disorders of the eye. Is the patient up to  date with their annual eye exam?  No  Who is the provider or what is the name of the office in which the patient attends annual eye exams? [facility] If pt is not established with a provider, would they like to be referred to a provider to establish care? No .   Dental Screening: Recommended annual dental exams for proper oral hygiene  Community Resource Referral / Chronic Care Management: CRR required this visit?  No   CCM required this visit?  No      Plan:     I have personally reviewed and noted the following in the patients chart:   Medical and social history Use of alcohol, tobacco or illicit drugs  Current medications and supplements including opioid prescriptions. Patient is not currently taking opioid prescriptions. Functional ability and status Nutritional status Physical activity Advanced directives List of other physicians Hospitalizations, surgeries, and ER visits in previous 12 months Vitals Screenings to include cognitive, depression, and falls Referrals and appointments  In addition, I have reviewed and discussed with patient certain preventive protocols, quality metrics, and best practice recommendations. A written personalized care plan for preventive services as well as general preventive health recommendations were provided to patient.  Chriss Driver, LPN   05/10/1593   Nurse Notes: Pt up to date on colonoscopy. Discussed Shingrix and how to obtain. Discussed eye exam and scheduling appointment.

## 2021-06-21 NOTE — Patient Instructions (Addendum)
[name] , Thank you for taking time to come for your Medicare Wellness Visit. I appreciate your ongoing commitment to your health goals. Please review the following plan we discussed and let me know if I can assist you in the future.   Screening recommendations/referrals: Colonoscopy: Done 06/30/2019 Repeat in 10 years  Recommended yearly ophthalmology/optometry visit for glaucoma screening and checkup Recommended yearly dental visit for hygiene and checkup  Vaccinations: Influenza vaccine: Done 04/21/2021 Repeat annually  Pneumococcal vaccine: Done 04/22/2014 and 04/28/2015 Tdap vaccine: Done 04/26/2019 Repeat in 10 years  Shingles vaccine: Shingrix discussed. Please contact your pharmacy for coverage information.     Covid-19: Done 09/01/2019, 09/29/2019 and 05/10/2020.  Advanced directives: Please bring a copy of your health care power of attorney and living will to the office to be added to your chart at your convenience.   Conditions/risks identified: Aim for 30 minutes of exercise or brisk walking each day, drink 6-8 glasses of water and eat lots of fruits and vegetables.   Next appointment: Follow up in one year for your annual wellness visit. 2023.  Preventive Care 32 Years and Older, Male  Preventive care refers to lifestyle choices and visits with your health care provider that can promote health and wellness. What does preventive care include? A yearly physical exam. This is also called an annual well check. Dental exams once or twice a year. Routine eye exams. Ask your health care provider how often you should have your eyes checked. Personal lifestyle choices, including: Daily care of your teeth and gums. Regular physical activity. Eating a healthy diet. Avoiding tobacco and drug use. Limiting alcohol use. Practicing safe sex. Taking low doses of aspirin every day. Taking vitamin and mineral supplements as recommended by your health care provider. What happens  during an annual well check? The services and screenings done by your health care provider during your annual well check will depend on your age, overall health, lifestyle risk factors, and family history of disease. Counseling  Your health care provider may ask you questions about your: Alcohol use. Tobacco use. Drug use. Emotional well-being. Home and relationship well-being. Sexual activity. Eating habits. History of falls. Memory and ability to understand (cognition). Work and work Statistician. Screening  You may have the following tests or measurements: Height, weight, and BMI. Blood pressure. Lipid and cholesterol levels. These may be checked every 5 years, or more frequently if you are over 37 years old. Skin check. Lung cancer screening. You may have this screening every year starting at age 59 if you have a 30-pack-year history of smoking and currently smoke or have quit within the past 15 years. Fecal occult blood test (FOBT) of the stool. You may have this test every year starting at age 77. Flexible sigmoidoscopy or colonoscopy. You may have a sigmoidoscopy every 5 years or a colonoscopy every 10 years starting at age 110. Prostate cancer screening. Recommendations will vary depending on your family history and other risks. Hepatitis C blood test. Hepatitis B blood test. Sexually transmitted disease (STD) testing. Diabetes screening. This is done by checking your blood sugar (glucose) after you have not eaten for a while (fasting). You may have this done every 1-3 years. Abdominal aortic aneurysm (AAA) screening. You may need this if you are a current or former smoker. Osteoporosis. You may be screened starting at age 25 if you are at high risk. Talk with your health care provider about your test results, treatment options, and if necessary, the need  for more tests. Vaccines  Your health care provider may recommend certain vaccines, such as: Influenza vaccine. This is  recommended every year. Tetanus, diphtheria, and acellular pertussis (Tdap, Td) vaccine. You may need a Td booster every 10 years. Zoster vaccine. You may need this after age 76. Pneumococcal 13-valent conjugate (PCV13) vaccine. One dose is recommended after age 67. Pneumococcal polysaccharide (PPSV23) vaccine. One dose is recommended after age 24. Talk to your health care provider about which screenings and vaccines you need and how often you need them. This information is not intended to replace advice given to you by your health care provider. Make sure you discuss any questions you have with your health care provider. Document Released: 07/21/2015 Document Revised: 03/13/2016 Document Reviewed: 04/25/2015 Elsevier Interactive Patient Education  2017 [place] Prevention in the Home Falls can cause injuries. They can happen to people of all ages. There are many things you can do to make your home safe and to help prevent falls. What can I do on the outside of my home? Regularly fix the edges of walkways and driveways and fix any cracks. Remove anything that might make you trip as you walk through a door, such as a raised step or threshold. Trim any bushes or trees on the path to your home. Use bright outdoor lighting. Clear any walking paths of anything that might make someone trip, such as rocks or tools. Regularly check to see if handrails are loose or broken. Make sure that both sides of any steps have handrails. Any raised decks and porches should have guardrails on the edges. Have any leaves, snow, or ice cleared regularly. Use sand or salt on walking paths during winter. Clean up any spills in your garage right away. This includes oil or grease spills. What can I do in the bathroom? Use night lights. Install grab bars by the toilet and in the tub and shower. Do not use towel bars as grab bars. Use non-skid mats or decals in the tub or shower. If you need to sit down in  the shower, use a plastic, non-slip stool. Keep the floor dry. Clean up any water that spills on the floor as soon as it happens. Remove soap buildup in the tub or shower regularly. Attach bath mats securely with double-sided non-slip rug tape. Do not have throw rugs and other things on the floor that can make you trip. What can I do in the bedroom? Use night lights. Make sure that you have a light by your bed that is easy to reach. Do not use any sheets or blankets that are too big for your bed. They should not hang down onto the floor. Have a firm chair that has side arms. You can use this for support while you get dressed. Do not have throw rugs and other things on the floor that can make you trip. What can I do in the kitchen? Clean up any spills right away. Avoid walking on wet floors. Keep items that you use a lot in easy-to-reach places. If you need to reach something above you, use a strong step stool that has a grab bar. Keep electrical cords out of the way. Do not use floor polish or wax that makes floors slippery. If you must use wax, use non-skid floor wax. Do not have throw rugs and other things on the floor that can make you trip. What can I do with my stairs? Do not leave any items on the stairs.  Make sure that there are handrails on both sides of the stairs and use them. Fix handrails that are broken or loose. Make sure that handrails are as long as the stairways. Check any carpeting to make sure that it is firmly attached to the stairs. Fix any carpet that is loose or worn. Avoid having throw rugs at the top or bottom of the stairs. If you do have throw rugs, attach them to the floor with carpet tape. Make sure that you have a light switch at the top of the stairs and the bottom of the stairs. If you do not have them, ask someone to add them for you. What else can I do to help prevent falls? Wear shoes that: Do not have high heels. Have rubber bottoms. Are comfortable  and fit you well. Are closed at the toe. Do not wear sandals. If you use a stepladder: Make sure that it is fully opened. Do not climb a closed stepladder. Make sure that both sides of the stepladder are locked into place. Ask someone to hold it for you, if possible. Clearly mark and make sure that you can see: Any grab bars or handrails. First and last steps. Where the edge of each step is. Use tools that help you move around (mobility aids) if they are needed. These include: Canes. Walkers. Scooters. Crutches. Turn on the lights when you go into a dark area. Replace any light bulbs as soon as they burn out. Set up your furniture so you have a clear path. Avoid moving your furniture around. If any of your floors are uneven, fix them. If there are any pets around you, be aware of where they are. Review your medicines with your doctor. Some medicines can make you feel dizzy. This can increase your chance of falling. Ask your doctor what other things that you can do to help prevent falls. This information is not intended to replace advice given to you by your health care provider. Make sure you discuss any questions you have with your health care provider. Document Released: 04/20/2009 Document Revised: 11/30/2015 Document Reviewed: 07/29/2014 Elsevier Interactive Patient Education  2017 [employer].

## 2021-06-25 ENCOUNTER — Ambulatory Visit (INDEPENDENT_AMBULATORY_CARE_PROVIDER_SITE_OTHER): Payer: Medicare Other | Admitting: *Deleted

## 2021-06-25 DIAGNOSIS — E785 Hyperlipidemia, unspecified: Secondary | ICD-10-CM

## 2021-06-25 DIAGNOSIS — N1831 Chronic kidney disease, stage 3a: Secondary | ICD-10-CM

## 2021-06-25 NOTE — Patient Instructions (Signed)
Visit Information  Thank you for taking time to visit with me today. Please don't hesitate to contact me if I can be of assistance to you before our next scheduled telephone appointment.  Following are the goals we discussed today:  - keep follow-up appointments- Boneta Lucks 07/11/21 - take medications as prescribed - continue to exercise/ walk your dog - drink adequate water - keep blood pressure within normal limits - contact RN care manager for questions [phone] - eat heart healthy diet - eat whole grain breads, cereal, pasta, foods high in fiber - eat smaller or less servings of red meat - practice portion control - fill half the plate with nonstarchy vegetables - read food labels for fat and fiber - follow up with your doctor as needed  - take all medications as prescribed - follow up with Dr. Posey Pronto 07/11/21 - continue to exercise  Our next appointment is - no follow up due to case closed today per patient request  Please call the care guide team at [phone] if you need to cancel or reschedule your appointment.   If you are experiencing a Mental Health or [place] or need someone to talk to, please call the [place] National Suicide Prevention Lifeline: 413-141-7359 or TTY: (909) 762-7356 TTY 9496774976) to talk to a trained counselor call 1-800-273-TALK (toll free, 24 hour hotline) go to [facility] [address], [place] [phone]) call 911   Patient verbalizes understanding of instructions provided today and agrees to view in [place].   Jacqlyn Larsen RNC, BSN RN Case Manager [name] [phone]

## 2021-06-25 NOTE — Chronic Care Management (AMB) (Signed)
Chronic Care Management   CCM RN Visit Note  06/25/2021 Name: [name] MRN: [id number] DOB: 19-Aug-1945  Subjective: [name] is a 75 y.o. year old male who is a primary care patient of [name], Austin Mcgee, MD. The care management team was consulted for assistance with disease management and care coordination needs.    Engaged with patient by telephone for follow up visit in response to provider referral for case management and/or care coordination services.   Consent to Services:  The patient was given information about Chronic Care Management services, agreed to services, and gave verbal consent prior to initiation of services.  Please see initial visit note for detailed documentation.   Patient agreed to services and verbal consent obtained.   Assessment: Review of patient past medical history, allergies, medications, health status, including review of consultants reports, laboratory and other test data, was performed as part of comprehensive evaluation and provision of chronic care management services.   SDOH (Social Determinants of Health) assessments and interventions performed:    CCM Care Plan  Allergies  Allergen Reactions   Augmentin [Amoxicillin-Pot Clavulanate] Nausea And Vomiting and Other (See Comments)    Has patient had a PCN reaction causing immediate rash, facial/tongue/throat swelling, SOB or lightheadedness with hypotension: No Has patient had a PCN reaction causing severe rash involving mucus membranes or skin necrosis: No Has patient had a PCN reaction that required hospitalization No Has patient had a PCN reaction occurring within the last 10 years: No If all of the above answers are "NO", then may proceed with Cephalosporin use.   Statins Other (See Comments)    Reaction:  Muscle pain     Outpatient Encounter Medications as of 06/25/2021  Medication Sig   allopurinol (ZYLOPRIM) 300 MG tablet TAKE 1 TABLET BY MOUTH  DAILY   clonazePAM (KLONOPIN) 0.5 MG  tablet Take 0.5 mg by mouth 2 (two) times daily as needed.   colchicine 0.6 MG tablet Take 0.6-1.2 mg by mouth 2 (two) times daily as needed (for gout flare).    escitalopram (LEXAPRO) 20 MG tablet Take 2 tablets (40 mg total) by mouth at bedtime.   ezetimibe (ZETIA) 10 MG tablet TAKE 1 TABLET BY MOUTH IN  THE MORNING   fluticasone (FLONASE) 50 MCG/ACT nasal spray USE 2 SPRAYS IN BOTH  NOSTRILS DAILY   gabapentin (NEURONTIN) 300 MG capsule TAKE 1 CAPSULE BY MOUTH 3  TIMES DAILY AS NEEDED   ibuprofen (ADVIL) 800 MG tablet Take 1 tablet (800 mg total) by mouth every 6 (six) hours as needed.   omeprazole (PRILOSEC) 20 MG capsule TAKE 1 CAPSULE BY MOUTH  DAILY   rOPINIRole (REQUIP) 4 MG tablet Take 4 mg by mouth at bedtime.    rosuvastatin (CRESTOR) 5 MG tablet TAKE 1 TABLET BY MOUTH  DAILY   traZODone (DESYREL) 100 MG tablet Take 50 mg by mouth daily as needed for sleep.   vitamin B-12 (CYANOCOBALAMIN) 1000 MCG tablet Take 1,000 mcg by mouth daily.   doxycycline (VIBRA-TABS) 100 MG tablet Take 1 tablet (100 mg total) by mouth 2 (two) times daily.   oxyCODONE-acetaminophen (PERCOCET) 5-325 MG tablet Take 1 tablet by mouth every 4 (four) hours as needed for severe pain.   No facility-administered encounter medications on file as of 06/25/2021.    Patient Active Problem List   Diagnosis Date Noted   MRSA (methicillin resistant staph aureus) culture positive 12/27/2019   UTI (urinary tract infection) 08/24/2016   Fever 08/24/2016  History of nephrectomy 08/24/2016   CKD (chronic kidney disease) stage 3, GFR 30-59 ml/min (HCC) 08/24/2016   Sepsis due to Escherichia coli (E. coli) ([place]) 04/14/2015   Bacteremia due to Gram-negative bacteria ([place]), E. Coli 04/14/2015   UTI (lower urinary tract infection) 04/13/2015   Anemia of chronic kidney failure 04/13/2015   Acute hyponatremia 04/13/2015   Bladder cancer ([place]) 06/23/2013   Hyperlipidemia    GERD (gastroesophageal reflux disease) 12/22/2012     Conditions to be addressed/monitored:HLD and CKD Stage 3  Care Plan : Chronic Kidney (Adult)  Updates made by Kassie Mends, RN since 06/25/2021 12:00 AM  Completed 06/25/2021   Problem: Disease Progression Resolved 06/25/2021  Priority: High     Long-Range Goal: Disease Progression Prevented or Minimized Completed 06/25/2021  Start Date: 01/05/2021  Expected End Date: 07/08/2021  This Visit's Progress: On track  Recent Progress: On track  Priority: High  Note:   Current Barriers:  Ineffective Self Health Maintenance - pt can benefit from education regarding CKD and progression of disease, pt has been taking Vitamin B12 tablets and reports B12 level within normal limits at present. Patient continues taking gabapentin. Pt reports he has neuroma and has been working with podiatrist.  Pt requests case closure today as he feels he is managing well with chronic disease management of health conditions. Clinical Goal(s):  Collaboration with [name], MD regarding development and update of comprehensive plan of care as evidenced by provider attestation and co-signature Inter-disciplinary care team collaboration (see longitudinal plan of care) patient will work with care management team to address care coordination and chronic disease management needs related to Disease Management Educational Needs Care Coordination Medication Management and Education   Interventions:  Evaluation of current treatment plan related to CKD Stage 3,  self-management and patient's adherence to plan as established by provider. Collaboration with [name], MD regarding development and update of comprehensive plan of care as evidenced by provider attestation       and co-signature Inter-disciplinary care team collaboration (see longitudinal plan of care) Reviewed plans with patient for ongoing care management follow up  Reinforced importance of keeping blood pressure within normal  limits Reinforced importance of healthy diet and drinking adequate fluids/ water Encouraged pt to get outside daily and importance of exercise Reviewed upcoming scheduled appoinments Reinforced importance of good blood pressure control, healthy diet, adequate fluids  Reviewed today case will be closed per pt request and if anything changes in the future please let his primary care provider know Self Care Activities:  Attends all scheduled provider appointments Attends church or other social activities Calls provider office for new concerns or questions Patient Goals:- call for medicine refill 2 or 3 days before it runs out - keep follow-up appointments- Boneta Lucks 07/11/2021 - take medications as prescribed - continue to exercise/ walk your dog - drink adequate water - keep blood pressure within normal limits - contact RN care manager for questions [phone] - case closed today per patient request - if health conditions change or new issues arise, please let your primary care provider know Follow Up Plan:   Telephone follow up appointment with care management team member scheduled for:  case closed     Care Plan : Hyperlipidemia  Updates made by Kassie Mends, RN since 06/25/2021 12:00 AM  Completed 06/25/2021   Problem: Health Promotion or Disease Self-Management Hyperlipidemia Resolved 06/25/2021  Priority: Medium     Long-Range Goal: Self-Management Plan Developed Hyperlipidemia Completed  06/25/2021  Start Date: 01/05/2021  Expected End Date: 07/08/2021  This Visit's Progress: On track  Recent Progress: On track  Priority: Medium  Note:   Current Barriers:  Maintenance of hyperlipidemia for overall health Current antihyperlipidemic regimen-  zetia/ crestor Most recent lipid panel:     Component Value Date/Time   CHOL 100 05/04/2020 1533   TRIG 125 05/04/2020 1533   HDL 40 05/04/2020 1533   CHOLHDL 2.5 05/04/2020 1533   VLDL 39 (H) 03/07/2017 1100   LDLCALC 39  05/04/2020 1533   ASCVD risk enhancing conditions: age >47, CKD Does not adhere to provider recommendations re:  does not always adhere to healthy diet, eats fast food at times RN Care Manager Clinical Goal(s):  patient will work with Consulting civil engineer, providers, and care team towards execution of optimized self-health management plan patient will verbalize understanding of plan for maintaining and monitor cholesterol at goal patient will attend all scheduled medical appointments:  follow up with primary care provider. Patient reports he has all medications and taking as prescribed, reports he feels managing health conditions satisfactorily and requests case closure and no longer wants to participate in chronic care management Interventions: Collaboration with [name], MD regarding development and update of comprehensive plan of care as evidenced by provider attestation and co-signature Inter-disciplinary care team collaboration (see longitudinal plan of care) Reviewed importance of taking medications as prescribed  Inter-disciplinary care team collaboration (see longitudinal plan of care) Reviewed current treatment plan related to hyperlipidemia and patient's adherence to plan as established by provider. Reviewed plans with patient for ongoing care management follow up and provided patient with direct contact information for care management team and case closure today per pt request Reinforced importance of eliminating high fatty foods from diet- bake or broil foods instead of frying Reinforced importance of exercising/ walking dog Patient Goals/Self-Care Activities: - eat whole grain breads, cereal, pasta, foods high in fiber - eat smaller or less servings of red meat - practice portion control - fill half the plate with nonstarchy vegetables - read food labels for fat and fiber - follow heart healthy diet - follow up with your doctor as needed  - take all medications as  prescribed Follow Up Plan: Telephone follow up appointment with care management team member scheduled for:  Case closed per pt request       Plan:The patient has been provided with contact information for the care management team and has been advised to call with any health related questions or concerns.  Case closed per patient request  Jacqlyn Larsen [facility], BSN RN Case Manager [facility] [phone]

## 2021-07-02 ENCOUNTER — Emergency Department (HOSPITAL_COMMUNITY): Payer: Medicare Other

## 2021-07-02 ENCOUNTER — Encounter (HOSPITAL_COMMUNITY): Payer: Self-pay

## 2021-07-02 ENCOUNTER — Emergency Department (HOSPITAL_COMMUNITY)
Admission: EM | Admit: 2021-07-02 | Discharge: 2021-07-02 | Disposition: A | Discharge status: Home / Self Care | Payer: Medicare Other | Attending: Emergency Medicine | Admitting: Emergency Medicine

## 2021-07-02 ENCOUNTER — Other Ambulatory Visit: Payer: Self-pay

## 2021-07-02 DIAGNOSIS — N183 Chronic kidney disease, stage 3 unspecified: Secondary | ICD-10-CM | POA: Diagnosis not present

## 2021-07-02 DIAGNOSIS — Z79899 Other long term (current) drug therapy: Secondary | ICD-10-CM | POA: Insufficient documentation

## 2021-07-02 DIAGNOSIS — U071 COVID-19: Secondary | ICD-10-CM | POA: Insufficient documentation

## 2021-07-02 DIAGNOSIS — R0602 Shortness of breath: Secondary | ICD-10-CM | POA: Diagnosis not present

## 2021-07-02 DIAGNOSIS — Z87891 Personal history of nicotine dependence: Secondary | ICD-10-CM | POA: Insufficient documentation

## 2021-07-02 DIAGNOSIS — D631 Anemia in chronic kidney disease: Secondary | ICD-10-CM | POA: Insufficient documentation

## 2021-07-02 DIAGNOSIS — R059 Cough, unspecified: Secondary | ICD-10-CM | POA: Diagnosis not present

## 2021-07-02 DIAGNOSIS — Z8551 Personal history of malignant neoplasm of bladder: Secondary | ICD-10-CM | POA: Diagnosis not present

## 2021-07-02 DIAGNOSIS — J029 Acute pharyngitis, unspecified: Secondary | ICD-10-CM | POA: Diagnosis not present

## 2021-07-02 LAB — GROUP A STREP BY PCR: Group A Strep by PCR: NOT DETECTED

## 2021-07-02 LAB — RESP PANEL BY RT-PCR (FLU A&B, COVID) ARPGX2
Influenza A by PCR: NEGATIVE
Influenza B by PCR: NEGATIVE
SARS Coronavirus 2 by RT PCR: POSITIVE — AB

## 2021-07-02 MED ORDER — LIDOCAINE VISCOUS HCL 2 % MT SOLN
15.0000 mL | Freq: Once | OROMUCOSAL | Status: AC
  Administered 2021-07-02: 14:00:00 15 mL via OROMUCOSAL
  Filled 2021-07-02: qty 15

## 2021-07-02 MED ORDER — LIDOCAINE VISCOUS HCL 2 % MT SOLN: 15.0000 mL | OROMUCOSAL | 0 refills | Status: DC | PRN

## 2021-07-02 NOTE — ED Triage Notes (Signed)
Patient c/o sore throat, cough, and nasal congestion x 2 days.

## 2021-07-02 NOTE — Discharge Instructions (Addendum)
Seen today for evaluation of ear, cold symptoms.  He tested negative for strep and flu.  You are positive for COVID.  I have prescribed you lidocaine to use for your sore throat.  Please take as directed.  Please make sure to stay well-hydrated with fluids, mainly water.  COVID is contagious, please make sure to quarantine and use your mask.  Please make sure to obtain plenty rest.  If you have any concern, new or worsening symptoms, please return the nearest emergency department for reevaluation.

## 2021-07-02 NOTE — ED Provider Notes (Signed)
[facility] DEPT Provider Note   CSN: [id number] Arrival date & time: 07/02/21  1035     History Chief Complaint  Patient presents with   Sore Throat   Cough   Nasal Congestion    [name] is a 75 y.o. male presents to the ED for eval of sore throat, nasal congestion, HA, cough, subjective fever, and body aches for the past 4 days. He denies any GI symptoms, chest pain, or SOB. He is the most concenrned about his sore throat. Still able to eat and drink, just with pain. He has tried Mucinex with some relief. Medical hx includes HLD. Surgical hx includes nephrectomy. Allergic to Augmentin and statins. Former smoker.    Sore Throat Associated symptoms include headaches. Pertinent negatives include no chest pain, no abdominal pain and no shortness of breath.  Cough Associated symptoms: fever, headaches, myalgias, rhinorrhea and sore throat   Associated symptoms: no chest pain, no chills, no ear pain, no eye discharge, no rash and no shortness of breath       Past Medical History:  Diagnosis Date   Allergy    Bacteremia due to Gram-negative bacteria 04/13/2015   Bladder rupture 2010   Bladder tumor    Cancer ([place])    bladder   Chronic kidney disease    Depression    Gout    History of unilateral nephrectomy 07/2014   [facility]   Hyperlipidemia    Restless leg syndrome, controlled     Patient Active Problem List   Diagnosis Date Noted   MRSA (methicillin resistant staph aureus) culture positive 12/27/2019   UTI (urinary tract infection) 08/24/2016   Fever 08/24/2016   History of nephrectomy 08/24/2016   CKD (chronic kidney disease) stage 3, GFR 30-59 ml/min ([place]) 08/24/2016   Sepsis due to Escherichia coli (E. coli) ([place]) 04/14/2015   Bacteremia due to Gram-negative bacteria ([place]), E. Coli 04/14/2015   UTI (lower urinary tract infection) 04/13/2015   Anemia of chronic kidney failure 04/13/2015   Acute hyponatremia 04/13/2015   Bladder  cancer ([place]) 06/23/2013   Hyperlipidemia    GERD (gastroesophageal reflux disease) 12/22/2012    Past Surgical History:  Procedure Laterality Date   APPENDECTOMY     BLADDER SURGERY  2010   COLONOSCOPY  06/05/2009   CYSTOSCOPY W/ RETROGRADES Bilateral 04/21/2013   Procedure: CYSTOSCOPY WITH RETROGRADE PYELOGRAM;  Surgeon: Molli Hazard, MD;  Location: [facility] ORS;  Service: Urology;  Laterality: Bilateral;   FRACTURE SURGERY Right 01/05/2005   lower leg/ankle   NASAL SEPTUM SURGERY     TONSILLECTOMY AND ADENOIDECTOMY     TRANSURETHRAL RESECTION OF BLADDER TUMOR WITH GYRUS (TURBT-GYRUS) N/A 04/21/2013   Procedure: TRANSURETHRAL RESECTION OF BLADDER TUMOR WITH GYRUS (TURBT-GYRUS), cystoscopy;  Surgeon: Molli Hazard, MD;  Location: [facility];  Service: Urology;  Laterality: N/A;       Family History  Problem Relation Age of Onset   Cancer Mother        ovarian   Cirrhosis Father    Alcohol abuse Father    Colon cancer Neg Hx    Colon polyps Neg Hx    Esophageal cancer Neg Hx    Rectal cancer Neg Hx    Stomach cancer Neg Hx     Social History   Tobacco Use   Smoking status: Former    Packs/day: 0.50    Years: 30.00    Pack years: 15.00    Types: Cigarettes    Quit  date: 07/24/1989    Years since quitting: 31.9   Smokeless tobacco: Never  Vaping Use   Vaping Use: Never used  Substance Use Topics   Alcohol use: Yes    Comment: social   Drug use: No    Home Medications Prior to Admission medications   Medication Sig Start Date End Date Taking? Authorizing Provider  lidocaine (XYLOCAINE) 2 % solution Use as directed 15 mLs in the mouth or throat as needed for mouth pain. 07/02/21  Yes Sherrell Puller, PA-C  allopurinol (ZYLOPRIM) 300 MG tablet TAKE 1 TABLET BY MOUTH  DAILY 03/28/21   [name], MD  benzonatate (TESSALON) 100 MG capsule Take 1 capsule (100 mg total) by mouth 3 (three) times daily as needed for cough. 07/03/21   Sherwood Gambler, MD   clonazePAM (KLONOPIN) 0.5 MG tablet Take 0.5 mg by mouth 2 (two) times daily as needed. 09/17/19   [provider]  colchicine 0.6 MG tablet Take 0.6-1.2 mg by mouth 2 (two) times daily as needed (for gout flare).     [provider]  doxycycline (VIBRA-TABS) 100 MG tablet Take 1 tablet (100 mg total) by mouth 2 (two) times daily. 05/17/21   Felipa Furnace, DPM  escitalopram (LEXAPRO) 20 MG tablet Take 2 tablets (40 mg total) by mouth at bedtime. 02/25/17   [name], MD  ezetimibe (ZETIA) 10 MG tablet TAKE 1 TABLET BY MOUTH IN  THE MORNING 05/11/21   [name], MD  fluticasone [facility]) 50 MCG/ACT nasal spray USE 2 SPRAYS IN BOTH  NOSTRILS DAILY 02/15/21   [name], MD  gabapentin (NEURONTIN) 300 MG capsule TAKE 1 CAPSULE BY MOUTH 3  TIMES DAILY AS NEEDED 06/15/20   [name], MD  ibuprofen (ADVIL) 800 MG tablet Take 1 tablet (800 mg total) by mouth every 6 (six) hours as needed. 05/07/21   Felipa Furnace, DPM  molnupiravir EUA (LAGEVRIO) 200 mg CAPS capsule Take 4 capsules (800 mg total) by mouth 2 (two) times daily for 5 days. 07/03/21 07/08/21  Sherwood Gambler, MD  omeprazole (PRILOSEC) 20 MG capsule TAKE 1 CAPSULE BY MOUTH  DAILY 06/21/21   [name], MD  oxyCODONE-acetaminophen (PERCOCET) 5-325 MG tablet Take 1 tablet by mouth every 4 (four) hours as needed for severe pain. 05/07/21   Felipa Furnace, DPM  rOPINIRole (REQUIP) 4 MG tablet Take 4 mg by mouth at bedtime.  05/07/18   [provider]  rosuvastatin (CRESTOR) 5 MG tablet TAKE 1 TABLET BY MOUTH  DAILY 04/02/21   [name], MD  traZODone (DESYREL) 100 MG tablet Take 50 mg by mouth daily as needed for sleep.    [provider]  vitamin B-12 (CYANOCOBALAMIN) 1000 MCG tablet Take 1,000 mcg by mouth daily.    [provider]    Allergies    Augmentin [amoxicillin-pot clavulanate] and Statins  Review of Systems   Review of Systems  Constitutional:   Positive for fever. Negative for chills.  HENT:  Positive for congestion, rhinorrhea and sore throat. Negative for drooling, ear pain and trouble swallowing.   Eyes:  Negative for photophobia, discharge and visual disturbance.  Respiratory:  Positive for cough. Negative for shortness of breath.   Cardiovascular:  Negative for chest pain and palpitations.  Gastrointestinal:  Negative for abdominal pain, diarrhea, nausea and vomiting.  Genitourinary:  Negative for dysuria and hematuria.  Musculoskeletal:  Positive for myalgias. Negative for arthralgias, back pain and joint  swelling.  Skin:  Negative for color change and rash.  Neurological:  Positive for headaches. Negative for syncope and weakness.   Physical Exam Updated Vital Signs BP 120/72    Pulse 84    Temp 98.5 F (36.9 C) (Oral)    Resp 17    Ht 5\' 9"  (1.753 m)    Wt 95.3 kg    SpO2 98%    BMI 31.01 kg/m   Physical Exam Vitals and nursing note reviewed.  Constitutional:      General: He is not in acute distress.    Appearance: He is not toxic-appearing.  HENT:     Head: Normocephalic and atraumatic.     Right Ear: Tympanic membrane, ear canal and external ear normal.     Left Ear: Tympanic membrane, ear canal and external ear normal.     Nose:     Comments: Bilateral nasal turbinate edema and erythema with scant clear nasal discharge.    Mouth/Throat:     Mouth: Mucous membranes are moist.     Comments: Mild pharyngeal erythema without exudate or edema noted.  Uvula midline.  Airway patent.  Moist mucous membranes. Eyes:     General: No scleral icterus.    Conjunctiva/sclera: Conjunctivae normal.  Cardiovascular:     Rate and Rhythm: Normal rate.  Pulmonary:     Effort: Pulmonary effort is normal. No respiratory distress.     Breath sounds: No wheezing.     Comments: CTAB. Satting 98% on room air. Speaking in full sentences with ease.  Abdominal:     General: Abdomen is flat.     Palpations: Abdomen is soft.   Musculoskeletal:        General: No deformity.     Cervical back: Normal range of motion.  Skin:    Findings: No rash.  Neurological:     General: No focal deficit present.     Mental Status: He is alert.    ED Results / Procedures / Treatments   Labs (all labs ordered are listed, but only abnormal results are displayed) Labs Reviewed  RESP PANEL BY RT-PCR (FLU A&B, COVID) ARPGX2 - Abnormal; Notable for the following components:      Result Value   SARS Coronavirus 2 by RT PCR POSITIVE (*)    All other components within normal limits  GROUP A STREP BY PCR    EKG None  Radiology DG Chest 2 View  Result Date: 07/02/2021 CLINICAL DATA:  Sore throat, cough which became productive today, nasal congestion for 2 days, shortness of breath with exertion EXAM: CHEST - 2 VIEW COMPARISON:  10/05/2020 FINDINGS: Normal heart size, mediastinal contours, and pulmonary vascularity. Lungs clear. No pulmonary infiltrate, pleural effusion, or pneumothorax. Osseous structures unremarkable. IMPRESSION: No acute abnormalities. Electronically Signed   By: Lavonia Dana M.D.   On: 07/02/2021 13:39    Procedures Procedures   Medications Ordered in ED Medications  lidocaine (XYLOCAINE) 2 % viscous mouth solution 15 mL (15 mLs Mouth/Throat Given 07/02/21 1351)    ED Course  I have reviewed the triage vital signs and the nursing notes.  Pertinent labs & imaging results that were available during my care of the patient were reviewed by me and considered in my medical decision making (see chart for details).  75 y/o M presents to the ED for eval of FLS for the past 4 days with exposure to sick contacts. On physical exam, the patient has mild pharyngeal erythema, without exudate or edema. Lungs  are CTAB. Vital signs are unremarkable. Viral panel and strep ordered. Ordered viscous lidocaine for the patient's sore throat.  The patient is COVID positive, flu and strep negative. CXR clear.   On re-eval,  the patient reports his throat is feeling. Discussed results and quarantining. Prescribed viscous lidocaine to go home with. Return precautions discussed. Patient agrees to plan. The patient is stable and being sent home in good condition.     MDM Rules/Calculators/A&P                          Final Clinical Impression(s) / ED Diagnoses Final diagnoses:  COVID    Rx / DC Orders ED Discharge Orders          Ordered    lidocaine (XYLOCAINE) 2 % solution  As needed        07/02/21 1359             Sherrell Puller, [place] 07/04/21 6147    Lacretia Leigh, MD 07/07/21 [phone]

## 2021-07-03 ENCOUNTER — Emergency Department (HOSPITAL_COMMUNITY)
Admission: EM | Admit: 2021-07-03 | Discharge: 2021-07-03 | Disposition: A | Discharge status: Home / Self Care | Payer: Medicare Other | Attending: Emergency Medicine | Admitting: Emergency Medicine

## 2021-07-03 ENCOUNTER — Other Ambulatory Visit: Payer: Self-pay

## 2021-07-03 ENCOUNTER — Encounter (HOSPITAL_COMMUNITY): Payer: Self-pay

## 2021-07-03 DIAGNOSIS — N183 Chronic kidney disease, stage 3 unspecified: Secondary | ICD-10-CM | POA: Insufficient documentation

## 2021-07-03 DIAGNOSIS — Z8551 Personal history of malignant neoplasm of bladder: Secondary | ICD-10-CM | POA: Diagnosis not present

## 2021-07-03 DIAGNOSIS — Z87891 Personal history of nicotine dependence: Secondary | ICD-10-CM | POA: Insufficient documentation

## 2021-07-03 DIAGNOSIS — U071 COVID-19: Secondary | ICD-10-CM | POA: Insufficient documentation

## 2021-07-03 DIAGNOSIS — J029 Acute pharyngitis, unspecified: Secondary | ICD-10-CM | POA: Diagnosis present

## 2021-07-03 MED ORDER — MOLNUPIRAVIR EUA 200MG CAPSULE: 4.0000 | ORAL_CAPSULE | Freq: Two times a day (BID) | ORAL | 0 refills | Status: AC

## 2021-07-03 MED ORDER — BENZONATATE 100 MG PO CAPS: 100.0000 mg | ORAL_CAPSULE | Freq: Three times a day (TID) | ORAL | 0 refills | Status: DC | PRN

## 2021-07-03 NOTE — ED Provider Notes (Signed)
[facility] DEPT Provider Note   CSN: [id number] Arrival date & time: 07/03/21  0554     History Chief Complaint  Patient presents with   Sore Throat    LEN [name] is a 75 y.o. male.  HPI 75 year old male presents with continued sore throat.  He has been dealing with symptoms starting on 12/24.  He was diagnosed with COVID yesterday.  He has been taking Tylenol, ibuprofen, and the lidocaine prescribed.  Symptoms helped temporarily but every time he coughs it hurts his throat.  He had sore throat and coughing all night last night and so his wife told him to come back to the hospital.  He states some mild intermittent shortness of breath.  Some low-grade fever.  It is painful to swallow but he has no difficulty swallowing otherwise.  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 coli) ([place]) 04/14/2015   Bacteremia due to Gram-negative bacteria ([place]), E. Coli 04/14/2015   UTI (lower urinary tract infection) 04/13/2015   Anemia of chronic kidney failure 04/13/2015   Acute hyponatremia 04/13/2015   Bladder cancer ([place]) 06/23/2013   Hyperlipidemia    GERD (gastroesophageal reflux disease) 12/22/2012    Past Surgical History:  Procedure Laterality Date   APPENDECTOMY     BLADDER SURGERY  2010   COLONOSCOPY  06/05/2009   CYSTOSCOPY W/  RETROGRADES Bilateral 04/21/2013   Procedure: CYSTOSCOPY WITH RETROGRADE PYELOGRAM;  Surgeon: Molli Hazard, MD;  Location: [facility];  Service: Urology;  Laterality: Bilateral;   FRACTURE SURGERY Right 01/05/2005   lower leg/ankle   NASAL SEPTUM SURGERY     TONSILLECTOMY AND ADENOIDECTOMY     TRANSURETHRAL RESECTION OF BLADDER TUMOR WITH GYRUS (TURBT-GYRUS) N/A 04/21/2013   Procedure: TRANSURETHRAL RESECTION OF BLADDER TUMOR WITH GYRUS (TURBT-GYRUS), cystoscopy;  Surgeon: Molli Hazard, MD;  Location: [facility];  Service: Urology;  Laterality: N/A;       Family History  Problem Relation Age of Onset   Cancer Mother        ovarian   Cirrhosis Father    Alcohol abuse Father    Colon cancer Neg Hx    Colon polyps Neg Hx    Esophageal cancer Neg Hx    Rectal cancer Neg Hx    Stomach cancer Neg Hx     Social History   Tobacco Use   Smoking status: Former    Packs/day: 0.50    Years: 30.00    Pack years: 15.00    Types: Cigarettes    Quit date: 07/24/1989    Years since quitting: 31.9   Smokeless tobacco: Never  Vaping Use   Vaping Use: Never used  Substance Use Topics   Alcohol use: Yes    Comment: social   Drug use:  No    Home Medications Prior to Admission medications   Medication Sig Start Date End Date Taking? Authorizing Provider  benzonatate (TESSALON) 100 MG capsule Take 1 capsule (100 mg total) by mouth 3 (three) times daily as needed for cough. 07/03/21  Yes Sherwood Gambler, MD  molnupiravir EUA (LAGEVRIO) 200 mg CAPS capsule Take 4 capsules (800 mg total) by mouth 2 (two) times daily for 5 days. 07/03/21 07/08/21 Yes Sherwood Gambler, MD  allopurinol (ZYLOPRIM) 300 MG tablet TAKE 1 TABLET BY MOUTH  DAILY 03/28/21   [name], MD  clonazePAM (KLONOPIN) 0.5 MG tablet Take 0.5 mg by mouth 2 (two) times daily as needed. 09/17/19   [provider]  colchicine 0.6 MG tablet Take 0.6-1.2 mg by mouth 2 (two) times daily as needed (for gout flare).      [provider]  doxycycline (VIBRA-TABS) 100 MG tablet Take 1 tablet (100 mg total) by mouth 2 (two) times daily. 05/17/21   Felipa Furnace, DPM  escitalopram (LEXAPRO) 20 MG tablet Take 2 tablets (40 mg total) by mouth at bedtime. 02/25/17   [name], MD  ezetimibe (ZETIA) 10 MG tablet TAKE 1 TABLET BY MOUTH IN  THE MORNING 05/11/21   [name], MD  fluticasone [facility]) 50 MCG/ACT nasal spray USE 2 SPRAYS IN BOTH  NOSTRILS DAILY 02/15/21   [name], MD  gabapentin (NEURONTIN) 300 MG capsule TAKE 1 CAPSULE BY MOUTH 3  TIMES DAILY AS NEEDED 06/15/20   [name], MD  ibuprofen (ADVIL) 800 MG tablet Take 1 tablet (800 mg total) by mouth every 6 (six) hours as needed. 05/07/21   Felipa Furnace, DPM  lidocaine (XYLOCAINE) 2 % solution Use as directed 15 mLs in the mouth or throat as needed for mouth pain. 07/02/21   Sherrell Puller, PA-C  omeprazole (PRILOSEC) 20 MG capsule TAKE 1 CAPSULE BY MOUTH  DAILY 06/21/21   [name], MD  oxyCODONE-acetaminophen (PERCOCET) 5-325 MG tablet Take 1 tablet by mouth every 4 (four) hours as needed for severe pain. 05/07/21   Felipa Furnace, DPM  rOPINIRole (REQUIP) 4 MG tablet Take 4 mg by mouth at bedtime.  05/07/18   [provider]  rosuvastatin (CRESTOR) 5 MG tablet TAKE 1 TABLET BY MOUTH  DAILY 04/02/21   [name], MD  traZODone (DESYREL) 100 MG tablet Take 50 mg by mouth daily as needed for sleep.    [provider]  vitamin B-12 (CYANOCOBALAMIN) 1000 MCG tablet Take 1,000 mcg by mouth daily.    [provider]    Allergies    Augmentin [amoxicillin-pot clavulanate] and Statins  Review of Systems   Review of Systems  Constitutional:  Negative for fever.  HENT:  Positive for sore throat.   Respiratory:  Positive for cough and shortness of breath.   All other systems reviewed and are negative.  Physical Exam Updated Vital Signs BP 137/66 (BP Location: Left Arm)    Pulse  75    Temp 98.2 F (36.8 C) (Oral)    Resp 14    SpO2 95%   Physical Exam Vitals and nursing note reviewed.  Constitutional:      General: He is not in acute distress.    Appearance: He is well-developed. He is not ill-appearing.  HENT:     Head: Normocephalic and atraumatic.     Right Ear: External ear normal.     Left Ear: External ear normal.  Nose: Nose normal.     Mouth/Throat:     Pharynx: Oropharynx is clear. Uvula midline. No posterior oropharyngeal erythema.     Tonsils: No tonsillar exudate or tonsillar abscesses.  Eyes:     General:        Right eye: No discharge.        Left eye: No discharge.  Cardiovascular:     Rate and Rhythm: Normal rate and regular rhythm.     Heart sounds: Normal heart sounds.  Pulmonary:     Effort: Pulmonary effort is normal.     Breath sounds: Normal breath sounds.  Abdominal:     Palpations: Abdomen is soft.     Tenderness: There is no abdominal tenderness.  Musculoskeletal:     Cervical back: Normal range of motion and neck supple.  Skin:    General: Skin is warm and dry.  Neurological:     Mental Status: He is alert.  Psychiatric:        Mood and Affect: Mood is not anxious.    ED Results / Procedures / Treatments   Labs (all labs ordered are listed, but only abnormal results are displayed) Labs Reviewed - No data to display  EKG None  Radiology DG Chest 2 View  Result Date: 07/02/2021 CLINICAL DATA:  Sore throat, cough which became productive today, nasal congestion for 2 days, shortness of breath with exertion EXAM: CHEST - 2 VIEW COMPARISON:  10/05/2020 FINDINGS: Normal heart size, mediastinal contours, and pulmonary vascularity. Lungs clear. No pulmonary infiltrate, pleural effusion, or pneumothorax. Osseous structures unremarkable. IMPRESSION: No acute abnormalities. Electronically Signed   By: Lavonia Dana M.D.   On: 07/02/2021 13:39    Procedures Procedures   Medications Ordered in ED Medications - No data to  display  ED Course  I have reviewed the triage vital signs and the nursing notes.  Pertinent labs & imaging results that were available during my care of the patient were reviewed by me and considered in my medical decision making (see chart for details).    MDM Rules/Calculators/A&P                         Patient with continued sore throat from COVID-19.  We discussed he is using all the appropriate medicines for the sore throat and based on time of onset of symptoms and comorbidities we discussed treating with antiviral such as molnupiravir (has CKD).  He would like to try this.  We will also give Tessalon Perles for his cough.  Continue other treatment such as Mucinex.  Otherwise, no evidence of deep space neck infection and I do not think further imaging is needed.  Will discharge home with return precautions.    Final Clinical Impression(s) / ED Diagnoses Final diagnoses:  COVID-19    Rx / DC Orders ED Discharge Orders          Ordered    molnupiravir EUA (LAGEVRIO) 200 mg CAPS capsule  2 times daily        07/03/21 0856    benzonatate (TESSALON) 100 MG capsule  3 times daily PRN        07/03/21 0856             Sherwood Gambler, MD 07/03/21 [phone]

## 2021-07-03 NOTE — Discharge Instructions (Addendum)
If you develop high fever, coughing up blood, shortness of breath, chest pain, inability to swallow, neck stiffness, or any other new/concerning symptoms then return to the ER for evaluation.

## 2021-07-03 NOTE — ED Triage Notes (Signed)
Pt complains of sore throat. Pt was dx with Covid yesterday.

## 2021-07-07 DIAGNOSIS — E785 Hyperlipidemia, unspecified: Secondary | ICD-10-CM

## 2021-07-07 DIAGNOSIS — N183 Chronic kidney disease, stage 3 unspecified: Secondary | ICD-10-CM | POA: Diagnosis not present

## 2021-07-11 ENCOUNTER — Ambulatory Visit (INDEPENDENT_AMBULATORY_CARE_PROVIDER_SITE_OTHER): Payer: Medicare Other | Admitting: Podiatry

## 2021-07-11 ENCOUNTER — Other Ambulatory Visit: Payer: Self-pay

## 2021-07-11 DIAGNOSIS — M7751 Other enthesopathy of right foot: Secondary | ICD-10-CM

## 2021-07-11 DIAGNOSIS — M7752 Other enthesopathy of left foot: Secondary | ICD-10-CM | POA: Diagnosis not present

## 2021-07-13 ENCOUNTER — Encounter: Payer: Self-pay | Admitting: Podiatry

## 2021-07-13 NOTE — Progress Notes (Signed)
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 male presents with the above complaint.  Patient presents with complaint of bilateral with a new complaint of bilateral second metatarsophalangeal joint pain.  Patient states it feels like he is walking on something and tends to be very painful.  Hurts after standing for long period of time.  He states the surgical site is doing well no acute complaints from there.  He denies any other acute complaints he wanted get it evaluated.  Pain scale is 7 out of 10 dull achy sometimes sharp shooting in nature.   Review of Systems: Negative except as noted in the HPI. Denies N/V/F/Ch.  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 , Disp: , Rfl:    doxycycline (VIBRA-TABS) 100 MG tablet, Take 1 tablet (100 mg total) by mouth 2 (two) times daily., Disp: 20 tablet, Rfl: 0   escitalopram (LEXAPRO) 20 MG tablet, Take 2 tablets (40 mg total) by mouth at bedtime., Disp: 180 tablet, Rfl: 3   ezetimibe (ZETIA) 10 MG tablet, TAKE 1 TABLET  BY MOUTH IN  THE MORNING, Disp: 90 tablet, Rfl: 3   fluticasone (FLONASE) 50 MCG/ACT nasal spray, USE 2 SPRAYS IN BOTH  NOSTRILS DAILY, Disp: 48 g, Rfl: 3   gabapentin (NEURONTIN) 300 MG capsule, TAKE 1 CAPSULE BY MOUTH 3  TIMES DAILY AS NEEDED, Disp: 270 capsule, Rfl: 3   ibuprofen (ADVIL) 800 MG tablet, Take 1 tablet (800 mg total) by mouth every 6 (six) hours as needed., Disp: 60 tablet, Rfl: 1   lidocaine (XYLOCAINE) 2 % solution, Use as directed 15 mLs in the mouth or throat as needed for mouth pain., Disp: 100 mL, Rfl: 0   omeprazole (PRILOSEC) 20 MG capsule, TAKE 1 CAPSULE BY MOUTH  DAILY, Disp: 90 capsule, Rfl: 3   oxyCODONE-acetaminophen (PERCOCET) 5-325 MG tablet, Take 1 tablet by mouth every 4 (four) hours as needed for severe pain., Disp: 30 tablet, Rfl: 0   rOPINIRole (REQUIP) 4 MG tablet, Take 4 mg by mouth at bedtime. , Disp: , Rfl:    rosuvastatin (CRESTOR) 5 MG tablet, TAKE 1 TABLET BY MOUTH  DAILY, Disp: 90 tablet, Rfl: 3   traZODone (DESYREL) 100 MG tablet, Take 50 mg by mouth daily as needed for sleep., Disp: , Rfl:    vitamin B-12 (CYANOCOBALAMIN) 1000 MCG  tablet, Take 1,000 mcg by mouth daily., Disp: , Rfl:   Social History   Tobacco Use  Smoking Status Former   Packs/day: 0.50   Years: 30.00   Pack years: 15.00   Types: Cigarettes   Quit date: 07/24/1989   Years since quitting: 31.9  Smokeless Tobacco Never    Allergies  Allergen Reactions   Augmentin [Amoxicillin-Pot Clavulanate] Nausea And Vomiting and Other (See Comments)    Has patient had a PCN reaction causing immediate rash, facial/tongue/throat swelling, SOB or lightheadedness with hypotension: No Has patient had a PCN reaction causing severe rash involving mucus membranes or skin necrosis: No Has patient had a PCN reaction that required hospitalization No Has patient had a PCN reaction occurring within the last 10 years: No If all of the above answers are "NO", then may proceed with Cephalosporin use.    Statins Other (See Comments)    Reaction:  Muscle pain    Objective:  There were no vitals filed for this visit. There is no height or weight on file to calculate BMI. Constitutional Well developed. Well nourished.  Vascular Dorsalis pedis pulses palpable bilaterally. Posterior tibial pulses palpable bilaterally. Capillary refill normal to all digits.  No cyanosis or clubbing noted. Pedal hair growth normal.  Neurologic Normal speech. Oriented to person, place, and time. Epicritic sensation to light touch grossly present bilaterally.  Dermatologic Nails well groomed and normal in appearance. No open wounds. No skin lesions.  Orthopedic: Pain on palpation bilateral second metatarsophalangeal joint pain with range of motion of both joints.  No pain at the surgical spot.  No recurrence of neuroma noted.  No extensor or flexor tendinitis noted.   Radiographs: None Assessment:   1. Capsulitis of metatarsophalangeal (MTP) joint of right foot   2. Capsulitis of metatarsophalangeal (MTP) joint of left foot    Plan:  Patient was evaluated and treated and all questions answered.  Bilateral second metatarsophalangeal joint capsulitis -I explained to the patient the etiology of capsulitis and various treatment options were extensively discussed.  Given the amount of pain that he is having I believe he will benefit from a steroid injection to both those joints to help decrease acute inflammatory component associate with pain.  Patient agrees with the plan like to proceed with a steroid injection -A steroid injection was performed at bilateral second metatarsophalangeal joint using 1% plain Lidocaine and 10 mg of Kenalog. This was well tolerated.   No follow-ups on file.

## 2021-07-26 ENCOUNTER — Ambulatory Visit (INDEPENDENT_AMBULATORY_CARE_PROVIDER_SITE_OTHER): Payer: Medicare Other | Admitting: Family Medicine

## 2021-07-26 ENCOUNTER — Encounter: Payer: Self-pay | Admitting: Family Medicine

## 2021-07-26 ENCOUNTER — Other Ambulatory Visit: Payer: Self-pay

## 2021-07-26 VITALS — BP 128/62 | HR 70 | Temp 97.4°F | Resp 18 | Ht 69.0 in | Wt 218.0 lb

## 2021-07-26 DIAGNOSIS — R058 Other specified cough: Secondary | ICD-10-CM

## 2021-07-26 MED ORDER — PREDNISONE 20 MG PO TABS: ORAL_TABLET | ORAL | 0 refills | Status: DC

## 2021-07-26 MED ORDER — HYDROCODONE BIT-HOMATROP MBR 5-1.5 MG/5ML PO SOLN: 5.0000 mL | Freq: Three times a day (TID) | ORAL | 0 refills | Status: DC | PRN

## 2021-07-26 NOTE — Progress Notes (Signed)
Subjective:    Patient ID: [name], male    DOB: 1946-04-30, 76 y.o.   MRN: [id number]  HPI Patient was diagnosed with COVID at the end of December.  He continues to have a cough.  He reports mucus draining down his airways causing him to cough and constantly has to clear his throat.  He denies any sinus pain but he does report sinus pressure.  He denies any wheezing or history of asthma.  He is already on a proton pump inhibitor for acid reflux.  He denies any fevers or chills or pleurisy or hemoptysis.  He denies any chest pain.  He feels that the cough is slowly gradually getting better.  Is more of an irritant cough triggered by postnasal drip.  He is taking Flonase.  Past Medical History:  Diagnosis Date   Allergy    Bacteremia due to Gram-negative bacteria 04/13/2015   Bladder rupture 2010   Bladder tumor    Cancer [facility])    bladder   Chronic kidney disease    Depression    Gout    History of unilateral nephrectomy 07/2014   [facility]   Hyperlipidemia    Restless leg syndrome, controlled    Past Surgical History:  Procedure Laterality Date   APPENDECTOMY     BLADDER SURGERY  2010   COLONOSCOPY  06/05/2009   CYSTOSCOPY W/ RETROGRADES Bilateral 04/21/2013   Procedure: CYSTOSCOPY WITH RETROGRADE PYELOGRAM;  Surgeon: Molli Hazard, MD;  Location: [facility];  Service: Urology;  Laterality: Bilateral;   FRACTURE SURGERY Right 01/05/2005   lower leg/ankle   NASAL SEPTUM SURGERY     TONSILLECTOMY AND ADENOIDECTOMY     TRANSURETHRAL RESECTION OF BLADDER TUMOR WITH GYRUS (TURBT-GYRUS) N/A 04/21/2013   Procedure: TRANSURETHRAL RESECTION OF BLADDER TUMOR WITH GYRUS (TURBT-GYRUS), cystoscopy;  Surgeon: Molli Hazard, MD;  Location: [facility];  Service: Urology;  Laterality: N/A;   Current Outpatient Medications on File Prior to Visit  Medication Sig Dispense Refill   allopurinol (ZYLOPRIM) 300 MG tablet TAKE 1 TABLET BY MOUTH  DAILY 90 tablet 3   clonazePAM (KLONOPIN) 0.5 MG  tablet Take 0.5 mg by mouth 2 (two) times daily as needed.     colchicine 0.6 MG tablet Take 0.6-1.2 mg by mouth 2 (two) times daily as needed (for gout flare).      escitalopram (LEXAPRO) 20 MG tablet Take 2 tablets (40 mg total) by mouth at bedtime. 180 tablet 3   ezetimibe (ZETIA) 10 MG tablet TAKE 1 TABLET BY MOUTH IN  THE MORNING 90 tablet 3   fluticasone (FLONASE) 50 MCG/ACT nasal spray USE 2 SPRAYS IN BOTH  NOSTRILS DAILY 48 g 3   gabapentin (NEURONTIN) 300 MG capsule TAKE 1 CAPSULE BY MOUTH 3  TIMES DAILY AS NEEDED 270 capsule 3   omeprazole (PRILOSEC) 20 MG capsule TAKE 1 CAPSULE BY MOUTH  DAILY 90 capsule 3   rOPINIRole (REQUIP) 4 MG tablet Take 4 mg by mouth at bedtime.      rosuvastatin (CRESTOR) 5 MG tablet TAKE 1 TABLET BY MOUTH  DAILY 90 tablet 3   traZODone (DESYREL) 100 MG tablet Take 50 mg by mouth daily as needed for sleep.     vitamin B-12 (CYANOCOBALAMIN) 1000 MCG tablet Take 1,000 mcg by mouth daily.     No current facility-administered medications on file prior to visit.   Allergies  Allergen Reactions   Augmentin [Amoxicillin-Pot Clavulanate] Nausea And Vomiting and Other (See Comments)  Has patient had a PCN reaction causing immediate rash, facial/tongue/throat swelling, SOB or lightheadedness with hypotension: No Has patient had a PCN reaction causing severe rash involving mucus membranes or skin necrosis: No Has patient had a PCN reaction that required hospitalization No Has patient had a PCN reaction occurring within the last 10 years: No If all of the above answers are "NO", then may proceed with Cephalosporin use.   Statins Other (See Comments)    Reaction:  Muscle pain    Social History   Socioeconomic History   Marital status: Married    Spouse name: Velva Harman   Number of children: 2   Years of education: Not on file   Highest education level: Not on file  Occupational History   Not on file  Tobacco Use   Smoking status: Former    Packs/day: 0.50     Years: 30.00    Pack years: 15.00    Types: Cigarettes    Quit date: 07/24/1989    Years since quitting: 32.0   Smokeless tobacco: Never  Vaping Use   Vaping Use: Never used  Substance and Sexual Activity   Alcohol use: Yes    Comment: social   Drug use: No   Sexual activity: Not on file  Other Topics Concern   Not on file  Social History Narrative   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   1 son died in 2016/09/28.   1 son living.   1 granddaughter.   Social Determinants of Health   Financial Resource Strain: Low Risk    Difficulty of Paying Living Expenses: Not hard at all  Food Insecurity: No Food Insecurity   Worried About Charity fundraiser in the Last Year: Never true   [place] in the Last Year: Never true  Transportation Needs: No Transportation Needs   Lack of Transportation (Medical): No   Lack of Transportation (Non-Medical): No  Physical Activity: Insufficiently Active   Days of Exercise per Week: 3 days   Minutes of Exercise per Session: 30 min  Stress: No Stress Concern Present   Feeling of Stress : Only a little  Social Connections: Engineer, building services of Communication with Friends and Family: More than three times a week   Frequency of Social Gatherings with Friends and Family: More than three times a week   Attends Religious Services: More than 4 times per year   Active Member of [employer] or Organizations: Yes   Attends Music therapist: More than 4 times per year   Marital Status: Married  Human resources officer Violence: Not At Risk   Fear of Current or Ex-Partner: No   Emotionally Abused: No   Physically Abused: No   Sexually Abused: No   Family History  Problem Relation Age of Onset   Cancer Mother        ovarian   Cirrhosis Father    Alcohol abuse Father    Colon cancer Neg Hx    Colon polyps Neg Hx    Esophageal cancer Neg Hx    Rectal cancer Neg Hx    Stomach cancer Neg Hx      Review of Systems  All other  systems reviewed and are negative.     Objective:   Physical Exam Vitals reviewed.  Constitutional:      General: He is not in acute distress.    Appearance: He is well-developed. He is not diaphoretic.  HENT:  Head: Normocephalic and atraumatic.  Neck:     Thyroid: No thyromegaly.     Vascular: No JVD.     Trachea: No tracheal deviation.  Cardiovascular:     Rate and Rhythm: Normal rate and regular rhythm.     Heart sounds: Normal heart sounds. No murmur heard.   No friction rub. No gallop.  Pulmonary:     Effort: Pulmonary effort is normal. No respiratory distress.     Breath sounds: Normal breath sounds. No stridor. No wheezing or rales.  Chest:     Chest wall: No tenderness.  Musculoskeletal:        General: No tenderness or deformity. Normal range of motion.  Skin:    General: Skin is warm.     Coloration: Skin is not pale.     Findings: No erythema or rash.  Neurological:     Mental Status: He is alert and oriented to person, place, and time.     Cranial Nerves: No cranial nerve deficit.     Motor: No abnormal muscle tone.     Coordination: Coordination normal.     Deep Tendon Reflexes: Reflexes are normal and symmetric.  Psychiatric:        Behavior: Behavior normal.        Thought Content: Thought content normal.        Judgment: Judgment normal.  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        Assessment & Plan:  Upper airway cough syndrome Patient has upper airway cough syndrome triggered by his recent COVID.  Continue Flonase.  I will start him on Hycodan 1 teaspoon every 6 hours as needed for cough to break the cycle of the coughing.  Continue omeprazole.  Use prednisone for postviral airway inflammation triggering a cough.  Reassess in 1 week or sooner if worsening

## 2021-08-22 ENCOUNTER — Ambulatory Visit (INDEPENDENT_AMBULATORY_CARE_PROVIDER_SITE_OTHER): Payer: Medicare Other | Admitting: Podiatry

## 2021-08-22 ENCOUNTER — Other Ambulatory Visit: Payer: Self-pay

## 2021-08-22 DIAGNOSIS — M7741 Metatarsalgia, right foot: Secondary | ICD-10-CM | POA: Diagnosis not present

## 2021-08-22 DIAGNOSIS — M7751 Other enthesopathy of right foot: Secondary | ICD-10-CM

## 2021-08-22 DIAGNOSIS — M7752 Other enthesopathy of left foot: Secondary | ICD-10-CM | POA: Diagnosis not present

## 2021-08-22 DIAGNOSIS — M7742 Metatarsalgia, left foot: Secondary | ICD-10-CM

## 2021-08-28 NOTE — Progress Notes (Signed)
Subjective:  Patient ID: [name], male    DOB: 06/21/1946,  MRN: [id number]  Chief Complaint  Patient presents with   Routine Post Op    DOS 10.31.22    76 y.o. male presents with the above complaint.  Patient presents with a follow-up of bilateral second metatarsophalangeal joint pain.  He states that injection helped for a little bit but not for long period he still has a shocklike feeling.  Is not causing him pain but it is bothering him because of the way he feels.  He wanted to get this evaluated.   Review of Systems: Negative except as noted in the HPI. Denies N/V/F/Ch.  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 , Disp: , Rfl:    escitalopram (LEXAPRO) 20 MG tablet, Take 2 tablets (40 mg total) by mouth at bedtime., Disp: 180 tablet, Rfl: 3   ezetimibe (ZETIA) 10 MG tablet, TAKE 1 TABLET BY MOUTH IN  THE MORNING, Disp: 90 tablet, Rfl: 3   fluticasone (FLONASE) 50 MCG/ACT nasal spray, USE 2 SPRAYS IN BOTH  NOSTRILS DAILY, Disp: 48 g, Rfl: 3   gabapentin (NEURONTIN) 300 MG capsule, TAKE 1 CAPSULE BY MOUTH 3  TIMES DAILY AS NEEDED, Disp: 270 capsule, Rfl: 3   HYDROcodone bit-homatropine (HYCODAN) 5-1.5 MG/5ML syrup, Take 5 mLs by mouth every 8 (eight) hours as needed for cough., Disp: 120 mL, Rfl: 0   omeprazole (PRILOSEC) 20 MG capsule, TAKE 1 CAPSULE BY MOUTH   DAILY, Disp: 90 capsule, Rfl: 3   predniSONE (DELTASONE) 20 MG tablet, 3 tabs poqday 1-2, 2 tabs poqday 3-4, 1 tab poqday 5-6, Disp: 12 tablet, Rfl: 0   rOPINIRole (REQUIP) 4 MG tablet, Take 4 mg by mouth at bedtime. , Disp: , Rfl:    rosuvastatin (CRESTOR) 5 MG tablet, TAKE 1 TABLET BY MOUTH  DAILY, Disp: 90 tablet, Rfl: 3   traZODone (DESYREL) 100 MG tablet, Take 50 mg by mouth daily as needed for sleep., Disp: , Rfl:    vitamin B-12 (CYANOCOBALAMIN) 1000 MCG tablet, Take 1,000 mcg by mouth daily., Disp: , Rfl:   Social History   Tobacco Use  Smoking Status Former   Packs/day: 0.50   Years: 30.00   Pack years: 15.00   Types: Cigarettes   Quit date: 07/24/1989   Years since quitting: 32.1  Smokeless Tobacco Never    Allergies  Allergen Reactions   Augmentin [Amoxicillin-Pot Clavulanate] Nausea And Vomiting and Other (See Comments)    Has patient had a PCN reaction causing immediate rash, facial/tongue/throat swelling, SOB or lightheadedness with hypotension: No Has patient had a PCN reaction causing severe rash involving mucus membranes or skin necrosis: No Has patient had a PCN reaction that required  hospitalization No Has patient had a PCN reaction occurring within the last 10 years: No If all of the above answers are "NO", then may proceed with Cephalosporin use.   Statins Other (See Comments)    Reaction:  Muscle pain    Objective:  There were no vitals filed for this visit. There is no height or weight on file to calculate BMI. Constitutional Well developed. Well nourished.  Vascular Dorsalis pedis pulses palpable bilaterally. Posterior tibial pulses palpable bilaterally. Capillary refill normal to all digits.  No cyanosis or clubbing noted. Pedal hair growth normal.  Neurologic Normal speech. Oriented to person, place, and time. Epicritic sensation to light touch grossly present bilaterally.  Dermatologic Nails well groomed and normal in appearance. No open  wounds. No skin lesions.  Orthopedic: No pain pain on palpation bilateral second metatarsophalangeal joint no pain with range of motion of both joints.  No pain at the surgical spot.  No recurrence of neuroma noted.  No extensor or flexor tendinitis noted.  Subjective component of sock feeling underneath the ball of the foot when ambulating   Radiographs: None Assessment:   No diagnosis found.  Plan:  Patient was evaluated and treated and all questions answered.  Bilateral second metatarsophalangeal joint capsulitis -All questions and concerns were discussed with the patient.  Clinically the steroid injection only helped for a little while.  He is still feeling a sock rolled up ball underneath his foot when he is ambulating.  Does not cause him pain however the feeling is different and is bothering him.  He wanted to get it evaluated make sure that there is nothing else. -I discussed with him that this could likely be more of nerve related in nature at this time I believe patient will benefit from metatarsal pads as well as shoe gear modification.  If there is any improvement we can discuss orthotics management as well.  Patient states understanding.   No follow-ups on file.

## 2021-08-31 ENCOUNTER — Ambulatory Visit (INDEPENDENT_AMBULATORY_CARE_PROVIDER_SITE_OTHER): Payer: Medicare Other | Admitting: Family Medicine

## 2021-08-31 ENCOUNTER — Encounter: Payer: Self-pay | Admitting: Family Medicine

## 2021-08-31 ENCOUNTER — Other Ambulatory Visit: Payer: Self-pay

## 2021-08-31 VITALS — BP 118/68 | HR 65 | Temp 98.7°F | Resp 18 | Ht 69.0 in | Wt 218.0 lb

## 2021-08-31 DIAGNOSIS — J40 Bronchitis, not specified as acute or chronic: Secondary | ICD-10-CM | POA: Diagnosis not present

## 2021-08-31 MED ORDER — AZITHROMYCIN 250 MG PO TABS: ORAL_TABLET | ORAL | 0 refills | Status: DC

## 2021-08-31 MED ORDER — HYDROCODONE BIT-HOMATROP MBR 5-1.5 MG/5ML PO SOLN: 5.0000 mL | Freq: Three times a day (TID) | ORAL | 0 refills | Status: DC | PRN

## 2021-08-31 NOTE — Progress Notes (Signed)
Subjective:    Patient ID: [name], male    DOB: 03-01-1946, 76 y.o.   MRN: [id number]  Sore Throat  Associated symptoms include coughing.  Cough  Patient was diagnosed with COVID at the end of December.  Patient symptoms began on Tuesday.  Symptoms include a cough productive of sputum, some mild shortness of breath, a croup-like cough, subjective fevers, and malaise.  He has a sore throat from coughing.  He denies any hemoptysis.  He denies any sinus pain or sinus pressure.  He does have some mild rhinorrhea. 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   [facility]   Hyperlipidemia    Restless leg syndrome, controlled    Past Surgical History:  Procedure Laterality Date   APPENDECTOMY     BLADDER SURGERY  2010   COLONOSCOPY  06/05/2009   CYSTOSCOPY W/ RETROGRADES Bilateral 04/21/2013   Procedure: CYSTOSCOPY WITH RETROGRADE PYELOGRAM;  Surgeon: Molli Hazard, MD;  Location: [facility];  Service: Urology;  Laterality: Bilateral;   FRACTURE SURGERY Right 01/05/2005   lower leg/ankle   NASAL SEPTUM SURGERY     TONSILLECTOMY AND ADENOIDECTOMY     TRANSURETHRAL RESECTION OF BLADDER TUMOR WITH GYRUS (TURBT-GYRUS) N/A 04/21/2013   Procedure: TRANSURETHRAL RESECTION OF BLADDER TUMOR WITH GYRUS (TURBT-GYRUS), cystoscopy;  Surgeon: Molli Hazard, MD;  Location: [facility];  Service: Urology;  Laterality: N/A;   Current Outpatient Medications on File Prior to Visit  Medication Sig Dispense Refill   allopurinol (ZYLOPRIM) 300 MG tablet TAKE 1 TABLET BY MOUTH  DAILY 90 tablet 3   clonazePAM (KLONOPIN) 0.5 MG tablet Take 0.5 mg by mouth 2 (two) times daily as needed.     colchicine 0.6 MG tablet Take 0.6-1.2 mg by mouth 2 (two) times daily as needed (for gout flare).       escitalopram (LEXAPRO) 20 MG tablet Take 2 tablets (40 mg total) by mouth at bedtime. 180 tablet 3   ezetimibe (ZETIA) 10 MG tablet TAKE 1 TABLET BY MOUTH IN  THE MORNING 90 tablet 3   fluticasone (FLONASE) 50 MCG/ACT nasal spray USE 2 SPRAYS IN BOTH  NOSTRILS DAILY 48 g 3   gabapentin (NEURONTIN) 300 MG capsule TAKE 1 CAPSULE BY MOUTH 3  TIMES DAILY AS NEEDED 270 capsule 3   omeprazole (PRILOSEC) 20 MG capsule TAKE 1 CAPSULE BY MOUTH  DAILY 90 capsule 3   rOPINIRole (REQUIP) 4 MG tablet Take 4 mg by mouth at bedtime.      rosuvastatin (CRESTOR) 5 MG tablet TAKE 1 TABLET BY MOUTH  DAILY 90 tablet 3   traZODone (DESYREL) 100 MG tablet Take 50 mg by mouth daily as needed for sleep.     vitamin B-12 (CYANOCOBALAMIN) 1000 MCG tablet Take 1,000 mcg by mouth daily.     No current facility-administered medications on file prior to visit.   Allergies  Allergen Reactions   Augmentin [Amoxicillin-Pot Clavulanate] Nausea And Vomiting and Other (See Comments)    Has patient had a PCN reaction causing immediate rash, facial/tongue/throat swelling, SOB or lightheadedness with hypotension: No Has patient had a PCN reaction causing severe rash involving mucus membranes or skin necrosis: No Has patient had a PCN reaction  that required hospitalization No Has patient had a PCN reaction occurring within the last 10 years: No If all of the above answers are "NO", then may proceed with Cephalosporin use.   Statins Other (See Comments)    Reaction:  Muscle pain    Social History   Socioeconomic History   Marital status: Married    Spouse name: Velva Harman   Number of children: 2   Years of education: Not on file   Highest education level: Not on file  Occupational History   Not on file  Tobacco Use   Smoking status: Former    Packs/day: 0.50    Years: 30.00    Pack years: 15.00    Types: Cigarettes    Quit date: 07/24/1989    Years since quitting: 32.1   Smokeless tobacco: Never  Vaping Use   Vaping  Use: Never used  Substance and Sexual Activity   Alcohol use: Yes    Comment: social   Drug use: No   Sexual activity: Not on file  Other Topics Concern   Not on file  Social History Narrative   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   1 son died in September 27, 2016.   1 son living.   1 granddaughter.   Social Determinants of Health   Financial Resource Strain: Low Risk    Difficulty of Paying Living Expenses: Not hard at all  Food Insecurity: No Food Insecurity   Worried About Charity fundraiser in the Last Year: Never true   [place] in the Last Year: Never true  Transportation Needs: No Transportation Needs   Lack of Transportation (Medical): No   Lack of Transportation (Non-Medical): No  Physical Activity: Insufficiently Active   Days of Exercise per Week: 3 days   Minutes of Exercise per Session: 30 min  Stress: No Stress Concern Present   Feeling of Stress : Only a little  Social Connections: Engineer, building services of Communication with Friends and Family: More than three times a week   Frequency of Social Gatherings with Friends and Family: More than three times a week   Attends Religious Services: More than 4 times per year   Active Member of [employer] or Organizations: Yes   Attends Music therapist: More than 4 times per year   Marital Status: Married  Human resources officer Violence: Not At Risk   Fear of Current or Ex-Partner: No   Emotionally Abused: No   Physically Abused: No   Sexually Abused: No   Family History  Problem Relation Age of Onset   Cancer Mother        ovarian   Cirrhosis Father    Alcohol abuse Father    Colon cancer Neg Hx    Colon polyps Neg Hx    Esophageal cancer Neg Hx    Rectal cancer Neg Hx    Stomach cancer Neg Hx      Review of Systems  Respiratory:  Positive for cough.   All other systems reviewed and are negative.     Objective:   Physical Exam Vitals reviewed.  Constitutional:      General: He is  not in acute distress.    Appearance: He is well-developed. He is not diaphoretic.  HENT:     Head: Normocephalic and atraumatic.     Right Ear: Tympanic membrane and ear canal normal.     Left Ear: Tympanic membrane and ear canal normal.  Nose: No congestion or rhinorrhea.     Mouth/Throat:     Mouth: No oral lesions.     Pharynx: No oropharyngeal exudate.  Neck:     Thyroid: No thyromegaly.     Vascular: No JVD.     Trachea: No tracheal deviation.  Cardiovascular:     Rate and Rhythm: Normal rate and regular rhythm.     Heart sounds: Normal heart sounds. No murmur heard.   No friction rub. No gallop.  Pulmonary:     Effort: Pulmonary effort is normal. No respiratory distress.     Breath sounds: No stridor. Rhonchi present. No wheezing or rales.  Chest:     Chest wall: No tenderness.  Musculoskeletal:        General: No tenderness or deformity. Normal range of motion.  Skin:    General: Skin is warm.     Coloration: Skin is not pale.     Findings: No erythema or rash.  Neurological:     Mental Status: He is alert and oriented to person, place, and time.     Cranial Nerves: No cranial nerve deficit.     Motor: No abnormal muscle tone.     Coordination: Coordination normal.     Deep Tendon Reflexes: Reflexes are normal and symmetric.  Psychiatric:        Behavior: Behavior normal.        Thought Content: Thought content normal.        Judgment: Judgment normal.  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        Assessment & Plan:  Bronchitis Believe this is bronchitis.  Begin a Z-Pak coupled with Hycodan 1 teaspoon every 6 hours as needed for cough.

## 2021-09-25 ENCOUNTER — Other Ambulatory Visit: Payer: Self-pay

## 2021-09-25 ENCOUNTER — Encounter: Payer: Self-pay | Admitting: Family Medicine

## 2021-09-25 ENCOUNTER — Ambulatory Visit (INDEPENDENT_AMBULATORY_CARE_PROVIDER_SITE_OTHER): Payer: Medicare Other | Admitting: Family Medicine

## 2021-09-25 VITALS — BP 118/70 | HR 76 | Temp 97.0°F | Ht 69.0 in | Wt 221.6 lb

## 2021-09-25 DIAGNOSIS — H1031 Unspecified acute conjunctivitis, right eye: Secondary | ICD-10-CM | POA: Diagnosis not present

## 2021-09-25 MED ORDER — POLYMYXIN B-TRIMETHOPRIM 10000-0.1 UNIT/ML-% OP SOLN: 2.0000 [drp] | OPHTHALMIC | 1 refills | Status: DC

## 2021-09-25 NOTE — Progress Notes (Signed)
? ?Subjective:  ? ? Patient ID: [name], male    DOB: 09/22/1945, 76 y.o.   MRN: [id number] ? ?HPI ?Patient's right eye is markedly erythematous and swollen.  There is copious mucopurulent discharge coming from his right eye.  The conjunctiva is erythematous and injected.  Patient denies any blurry vision although the does burn.  He also has a swollen right-sided submandibular lymph node.  This is tender to palpation.  He denies any sinus pain or throat pain or ear pain or cough or headache. ? ?Past Medical History:  ?Diagnosis Date  ? Allergy   ? Bacteremia due to Gram-negative bacteria 04/13/2015  ? Bladder rupture 2010  ? Bladder tumor   ? Cancer [facility])   ? bladder  ? Chronic kidney disease   ? Depression   ? Gout   ? 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 [facility]  ? Hyperlipidemia   ? Restless leg syndrome, controlled   ? ?Past Surgical History:  ?Procedure Laterality Date  ? APPENDECTOMY    ? BLADDER SURGERY  2010  ? COLONOSCOPY  06/05/2009  ? CYSTOSCOPY W/ RETROGRADES Bilateral 04/21/2013  ? Procedure: CYSTOSCOPY WITH RETROGRADE PYELOGRAM;  Surgeon: Molli Hazard, MD;  Location: [facility];  Service: Urology;  Laterality: Bilateral;  ? FRACTURE SURGERY Right 01/05/2005  ? lower leg/ankle  ? NASAL SEPTUM SURGERY    ? TONSILLECTOMY AND ADENOIDECTOMY    ? TRANSURETHRAL RESECTION OF BLADDER TUMOR WITH GYRUS (TURBT-GYRUS) N/A 04/21/2013  ? Procedure: TRANSURETHRAL RESECTION OF BLADDER TUMOR WITH GYRUS (TURBT-GYRUS), cystoscopy;  Surgeon: Molli Hazard, MD;  Location: [facility];  Service: Urology;  Laterality: N/A;  ? ?Current Outpatient Medications on File Prior to Visit  ?Medication Sig Dispense Refill  ? allopurinol (ZYLOPRIM) 300 MG tablet TAKE 1 TABLET BY MOUTH  DAILY 90 tablet 3  ? clonazePAM (KLONOPIN) 0.5 MG tablet Take 0.5 mg by mouth 2 (two) times daily as needed.    ? colchicine 0.6 MG tablet Take 0.6-1.2 mg by mouth 2 (two) times daily as needed (for gout flare).     ? escitalopram (LEXAPRO)  20 MG tablet Take 2 tablets (40 mg total) by mouth at bedtime. 180 tablet 3  ? ezetimibe (ZETIA) 10 MG tablet TAKE 1 TABLET BY MOUTH IN  THE MORNING 90 tablet 3  ? fluticasone (FLONASE) 50 MCG/ACT nasal spray USE 2 SPRAYS IN BOTH  NOSTRILS DAILY 48 g 3  ? omeprazole (PRILOSEC) 20 MG capsule TAKE 1 CAPSULE BY MOUTH  DAILY 90 capsule 3  ? rOPINIRole (REQUIP) 4 MG tablet Take 4 mg by mouth at bedtime.     ? rosuvastatin (CRESTOR) 5 MG tablet TAKE 1 TABLET BY MOUTH  DAILY 90 tablet 3  ? traZODone (DESYREL) 100 MG tablet Take 50 mg by mouth daily as needed for sleep.    ? vitamin B-12 (CYANOCOBALAMIN) 1000 MCG tablet Take 1,000 mcg by mouth daily.    ? azithromycin (ZITHROMAX) 250 MG tablet 2 tabs poqday1, 1 tab poqday 2-5 (Patient not taking: Reported on 09/25/2021) 6 tablet 0  ? gabapentin (NEURONTIN) 300 MG capsule TAKE 1 CAPSULE BY MOUTH 3  TIMES DAILY AS NEEDED (Patient not taking: Reported on 09/25/2021) 270 capsule 3  ? HYDROcodone bit-homatropine (HYCODAN) 5-1.5 MG/5ML syrup Take 5 mLs by mouth every 8 (eight) hours as needed for cough. (Patient not taking: Reported on 09/25/2021) 120 mL 0  ? ?No current facility-administered medications on file prior to visit.  ? ?Allergies  ?Allergen Reactions  ?  Augmentin [Amoxicillin-Pot Clavulanate] Nausea And Vomiting and Other (See Comments)  ?  Has patient had a PCN reaction causing immediate rash, facial/tongue/throat swelling, SOB or lightheadedness with hypotension: No ?Has patient had a PCN reaction causing severe rash involving mucus membranes or skin necrosis: No ?Has patient had a PCN reaction that required hospitalization No ?Has patient had a PCN reaction occurring within the last 10 years: No ?If all of the above answers are "NO", then may proceed with Cephalosporin use.  ? Statins Other (See Comments)  ?  Reaction:  Muscle pain   ? ?Social History  ? ?Socioeconomic History  ? Marital status: Married  ?  Spouse name: Velva Harman  ? Number of children: 2  ? Years of  education: Not on file  ? Highest education level: Not on file  ?Occupational History  ? Not on file  ?Tobacco Use  ? Smoking status: Former  ?  Packs/day: 0.50  ?  Years: 30.00  ?  Pack years: 15.00  ?  Types: Cigarettes  ?  Quit date: 07/24/1989  ?  Years since quitting: 32.1  ? Smokeless tobacco: Never  ?Vaping Use  ? Vaping Use: Never used  ?Substance and Sexual Activity  ? Alcohol use: Yes  ?  Comment: social  ? Drug use: No  ? Sexual activity: Not on file  ?Other Topics Concern  ? Not on file  ?Social History Narrative  ? 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  ? 1 son died in Oct 08, 2016.  ? 1 son living.  ? 1 granddaughter.  ? ?Social Determinants of Health  ? ?Financial Resource Strain: Low Risk   ? Difficulty of Paying Living Expenses: Not hard at all  ?Food Insecurity: No Food Insecurity  ? Worried About Charity fundraiser in the Last Year: Never true  ? Ran Out of Food in the Last Year: Never true  ?Transportation Needs: No Transportation Needs  ? Lack of Transportation (Medical): No  ? Lack of Transportation (Non-Medical): No  ?Physical Activity: Insufficiently Active  ? Days of Exercise per Week: 3 days  ? Minutes of Exercise per Session: 30 min  ?Stress: No Stress Concern Present  ? Feeling of Stress : Only a little  ?Social Connections: Socially Integrated  ? Frequency of Communication with Friends and Family: More than three times a week  ? Frequency of Social Gatherings with Friends and Family: More than three times a week  ? Attends Religious Services: More than 4 times per year  ? Active Member of Clubs or Organizations: Yes  ? Attends Archivist Meetings: More than 4 times per year  ? Marital Status: Married  ?Intimate Partner Violence: Not At Risk  ? Fear of Current or Ex-Partner: No  ? Emotionally Abused: No  ? Physically Abused: No  ? Sexually Abused: No  ? ?Family History  ?Problem Relation Age of Onset  ? Cancer Mother   ?     ovarian  ? Cirrhosis Father   ? Alcohol abuse Father    ? Colon cancer Neg Hx   ? Colon polyps Neg Hx   ? Esophageal cancer Neg Hx   ? Rectal cancer Neg Hx   ? Stomach cancer Neg Hx   ? ? ? ?Review of Systems  ?All other systems reviewed and are negative. ? ?   ?Objective:  ? Physical Exam ?Vitals reviewed.  ?Constitutional:   ?   General: He is not in acute distress. ?  Appearance: He is well-developed. He is not diaphoretic.  ?HENT:  ?   Head: Normocephalic and atraumatic.  ?   Right Ear: Tympanic membrane and ear canal normal.  ?   Left Ear: Tympanic membrane and ear canal normal.  ?   Nose: Nose normal. No congestion or rhinorrhea.  ?   Mouth/Throat:  ?   Pharynx: Oropharynx is clear. Posterior oropharyngeal erythema present. No oropharyngeal exudate.  ?Eyes:  ?   General:     ?   Right eye: Discharge present.  ?   Conjunctiva/sclera:  ?   Right eye: Right conjunctiva is injected. Exudate present.  ?Neck:  ?   Thyroid: No thyromegaly.  ?   Vascular: No JVD.  ?   Trachea: No tracheal deviation.  ?Cardiovascular:  ?   Rate and Rhythm: Normal rate and regular rhythm.  ?   Heart sounds: Normal heart sounds. No murmur heard. ?  No friction rub. No gallop.  ?Pulmonary:  ?   Effort: Pulmonary effort is normal. No respiratory distress.  ?   Breath sounds: Normal breath sounds. No stridor. No wheezing or rales.  ?Chest:  ?   Chest wall: No tenderness.  ?Musculoskeletal:     ?   General: No tenderness or deformity. Normal range of motion.  ?Lymphadenopathy:  ?   Cervical: Cervical adenopathy present.  ?Skin: ?   General: Skin is warm.  ?   Coloration: Skin is not pale.  ?   Findings: No erythema or rash.  ?Neurological:  ?   Mental Status: He is alert and oriented to person, place, and time.  ?   Cranial Nerves: No cranial nerve deficit.  ?   Motor: No abnormal muscle tone.  ?   Coordination: Coordination normal.  ?   Deep Tendon Reflexes: Reflexes are normal and symmetric.  ?Psychiatric:     ?   Behavior: Behavior normal.     ?   Thought Content: Thought content normal.      ?   Judgment: Judgment normal.  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 ? ? ? ? ?   ?Assessment & Plan:  ?Acute bacterial conjunctivitis of right eye ?Based on the amount of discharg

## 2021-10-01 ENCOUNTER — Telehealth: Payer: Self-pay

## 2021-10-01 NOTE — Telephone Encounter (Signed)
Pt called in stating that pcp wanted to be notified if pink eye did not get any better. Pt states that it has not, and would like a cb as to what he may need to do. Please advise. ? ?Cb#: [phone] ?

## 2021-10-02 ENCOUNTER — Other Ambulatory Visit: Payer: Self-pay | Admitting: Family Medicine

## 2021-10-02 MED ORDER — DICLOFENAC SODIUM 0.1 % OP SOLN: 1.0000 [drp] | Freq: Four times a day (QID) | OPHTHALMIC | 0 refills | Status: DC

## 2021-10-03 NOTE — Telephone Encounter (Signed)
Left msg

## 2021-10-04 NOTE — Telephone Encounter (Signed)
Pt aware and have meds.  Nothing at this time ?

## 2021-10-15 ENCOUNTER — Other Ambulatory Visit (HOSPITAL_COMMUNITY): Payer: Self-pay | Admitting: Urology

## 2021-10-15 ENCOUNTER — Ambulatory Visit (HOSPITAL_COMMUNITY)
Admission: RE | Admit: 2021-10-15 | Discharge: 2021-10-15 | Disposition: A | Discharge status: Home / Self Care | Payer: Medicare Other | Source: Ambulatory Visit | Attending: Urology | Admitting: Urology

## 2021-10-15 DIAGNOSIS — C678 Malignant neoplasm of overlapping sites of bladder: Secondary | ICD-10-CM | POA: Insufficient documentation

## 2021-10-17 ENCOUNTER — Telehealth: Payer: Self-pay

## 2021-10-17 ENCOUNTER — Other Ambulatory Visit (HOSPITAL_COMMUNITY): Payer: Medicare Other

## 2021-10-17 DIAGNOSIS — G609 Hereditary and idiopathic neuropathy, unspecified: Secondary | ICD-10-CM

## 2021-10-17 NOTE — Telephone Encounter (Signed)
[facility] faxed a refill request for  ? ?gabapentin (NEURONTIN) 300 MG capsule [[id number]]  ?  Order Details ?Dose, Route, Frequency: As Directed  ?Dispense Quantity: 270 capsule Refills: 3   ?Note to Pharmacy: Requesting 1 year supply  ?     ?Sig: TAKE 1 CAPSULE BY MOUTH 3  TIMES DAILY AS NEEDED  ?Patient not taking: Reported on 09/25/2021  ?     ?Start Date: 06/15/20 End Date: --  ?Written Date: 06/15/20 Expiration Date: 06/15/21  ? ?

## 2021-10-17 NOTE — Telephone Encounter (Signed)
Left msg for pt re meds, need to verify if still taking it ?

## 2021-10-24 MED ORDER — GABAPENTIN 300 MG PO CAPS: ORAL_CAPSULE | ORAL | 3 refills | Status: DC

## 2021-10-24 NOTE — Telephone Encounter (Signed)
Rx sent in. ? ?per pt was not taking it but now he is taking it again because it helps him sleep. Per pt he is only taking 1 cap a day vs 3x's .  ? ?

## 2021-10-24 NOTE — Addendum Note (Signed)
Addended by: Colman Cater on: 10/24/2021 12:10 PM ? ? Modules accepted: Orders ? ?

## 2021-11-28 ENCOUNTER — Ambulatory Visit: Payer: Medicare Other | Admitting: Podiatry

## 2021-11-28 DIAGNOSIS — M7751 Other enthesopathy of right foot: Secondary | ICD-10-CM

## 2021-11-29 NOTE — Progress Notes (Signed)
Subjective:  Patient ID: [name], male    DOB: May 11, 1946,  MRN: [id number]  Chief Complaint  Patient presents with   Foot Pain    76 y.o. male presents with the above complaint.  Patient presents with a follow-up of bilateral second metatarsophalangeal joint pain.  He states the right side slightly worse than left side but the injection has not helped the padding has not helped.  He states he still gets a sock like feeling on the bottom part of the foot.  He states he has not gotten any better.   Review of Systems: Negative except as noted in the HPI. Denies N/V/F/Ch.  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 , Disp: , Rfl:    diclofenac (VOLTAREN) 0.1 % ophthalmic solution, Place 1 drop into both eyes 4 (four) times daily., Disp: 5 mL, Rfl: 0   escitalopram (LEXAPRO) 20 MG tablet, Take 2 tablets (40 mg total) by mouth at bedtime., Disp: 180 tablet, Rfl: 3   ezetimibe (ZETIA) 10 MG tablet, TAKE 1 TABLET BY MOUTH IN  THE MORNING, Disp: 90 tablet, Rfl: 3   fluticasone (FLONASE) 50 MCG/ACT nasal spray, USE 2 SPRAYS IN BOTH  NOSTRILS DAILY, Disp: 48 g, Rfl: 3   gabapentin (NEURONTIN) 300 MG capsule, TAKE 1 CAPSULE BY MOUTH 3   TIMES DAILY AS NEEDED, Disp: 270 capsule, Rfl: 3   HYDROcodone bit-homatropine (HYCODAN) 5-1.5 MG/5ML syrup, Take 5 mLs by mouth every 8 (eight) hours as needed for cough. (Patient not taking: Reported on 09/25/2021), Disp: 120 mL, Rfl: 0   omeprazole (PRILOSEC) 20 MG capsule, TAKE 1 CAPSULE BY MOUTH  DAILY, Disp: 90 capsule, Rfl: 3   rOPINIRole (REQUIP) 4 MG tablet, Take 4 mg by mouth at bedtime. , Disp: , Rfl:    rosuvastatin (CRESTOR) 5 MG tablet, TAKE 1 TABLET BY MOUTH  DAILY, Disp: 90 tablet, Rfl: 3   traZODone (DESYREL) 100 MG tablet, Take 50 mg by mouth daily as needed for sleep., Disp: , Rfl:    trimethoprim-polymyxin b (POLYTRIM) ophthalmic solution, Place 2 drops into the right eye every 4 (four) hours., Disp: 10 mL, Rfl: 1   vitamin B-12 (CYANOCOBALAMIN) 1000 MCG tablet, Take 1,000 mcg by mouth daily., Disp: , Rfl:   Social History   Tobacco Use  Smoking Status Former   Packs/day: 0.50   Years: 30.00   Pack years: 15.00   Types: Cigarettes   Quit date: 07/24/1989   Years since quitting: 32.3  Smokeless Tobacco Never    Allergies  Allergen Reactions   Augmentin [Amoxicillin-Pot Clavulanate] Nausea  And Vomiting and Other (See Comments)    Has patient had a PCN reaction causing immediate rash, facial/tongue/throat swelling, SOB or lightheadedness with hypotension: No Has patient had a PCN reaction causing severe rash involving mucus membranes or skin necrosis: No Has patient had a PCN reaction that required hospitalization No Has patient had a PCN reaction occurring within the last 10 years: No If all of the above answers are "NO", then may proceed with Cephalosporin use.   Statins Other (See Comments)    Reaction:  Muscle pain    Objective:  There were no vitals filed for this visit. There is no height or weight on file to calculate BMI. Constitutional Well developed. Well nourished.  Vascular Dorsalis pedis pulses palpable bilaterally. Posterior tibial pulses palpable  bilaterally. Capillary refill normal to all digits.  No cyanosis or clubbing noted. Pedal hair growth normal.  Neurologic Normal speech. Oriented to person, place, and time. Epicritic sensation to light touch grossly present bilaterally.  Dermatologic Nails well groomed and normal in appearance. No open wounds. No skin lesions.  Orthopedic:  pain pain on palpation bilateral second metatarsophalangeal joint no pain with range of motion of both joints.  No pain at the surgical spot.  No recurrence of neuroma noted.  No extensor or flexor tendinitis noted.  Subjective component of sock feeling underneath the ball of the foot when ambulating   Radiographs: None Assessment:   1. Capsulitis of metatarsophalangeal (MTP) joint of right foot     Plan:  Patient was evaluated and treated and all questions answered.  Bilateral right greater than left second metatarsophalangeal joint capsulitis -All questions and concerns were discussed with the patient. -Clinically patient has failed padding offloading shoe gear modification as well as steroid injection at this time I believe patient will benefit from an MRI evaluation to evaluate. -MRI was ordered to rule out capsulitis/plantar plate rupture/possible recurrent neuroma.    No follow-ups on file.

## 2021-12-14 ENCOUNTER — Ambulatory Visit
Admission: RE | Admit: 2021-12-14 | Discharge: 2021-12-14 | Disposition: A | Discharge status: Home / Self Care | Payer: Medicare Other | Source: Ambulatory Visit | Attending: Podiatry | Admitting: Podiatry

## 2021-12-14 DIAGNOSIS — M7751 Other enthesopathy of right foot: Secondary | ICD-10-CM

## 2021-12-14 MED ORDER — GADOBENATE DIMEGLUMINE 529 MG/ML IV SOLN
20.0000 mL | Freq: Once | INTRAVENOUS | Status: AC | PRN
  Administered 2021-12-14: 20 mL via INTRAVENOUS

## 2021-12-19 ENCOUNTER — Ambulatory Visit: Payer: Medicare Other | Admitting: Podiatry

## 2021-12-19 DIAGNOSIS — Z01818 Encounter for other preprocedural examination: Secondary | ICD-10-CM | POA: Diagnosis not present

## 2021-12-19 DIAGNOSIS — G5761 Lesion of plantar nerve, right lower limb: Secondary | ICD-10-CM | POA: Diagnosis not present

## 2021-12-19 DIAGNOSIS — G5762 Lesion of plantar nerve, left lower limb: Secondary | ICD-10-CM | POA: Diagnosis not present

## 2021-12-21 NOTE — Progress Notes (Signed)
Subjective:  Patient ID: [name], male    DOB: 1945/11/04,  MRN: [id number]  Chief Complaint  Patient presents with   Foot Pain    MRI results     76 y.o. male presents with the above complaint.  Patient presents with continuous problems with bilateral neuritis removal with neurectomy.  He states is still very painful to touch is progressive gotten worse.  He had an MRI done which shows recurrence of neuroma.  He would like to have it removed to both feet at the same time.  He is having the same Symptoms to the left side as well therefore will defer the MRI and we will plan for surgical removal as this is primarily clinical diagnosis  Review of Systems: Negative except as noted in the HPI. Denies N/V/F/Ch.  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 , Disp: , Rfl:    diclofenac (VOLTAREN) 0.1 % ophthalmic solution, Place 1 drop into both eyes 4 (four) times daily., Disp: 5 mL, Rfl: 0   escitalopram (LEXAPRO) 20 MG tablet, Take 2 tablets (40 mg total) by mouth at bedtime., Disp: 180 tablet, Rfl: 3   ezetimibe (ZETIA) 10 MG tablet, TAKE 1 TABLET BY MOUTH IN  THE MORNING, Disp: 90 tablet, Rfl: 3   fluticasone (FLONASE) 50 MCG/ACT  nasal spray, USE 2 SPRAYS IN BOTH  NOSTRILS DAILY, Disp: 48 g, Rfl: 3   gabapentin (NEURONTIN) 300 MG capsule, TAKE 1 CAPSULE BY MOUTH 3  TIMES DAILY AS NEEDED, Disp: 270 capsule, Rfl: 3   HYDROcodone bit-homatropine (HYCODAN) 5-1.5 MG/5ML syrup, Take 5 mLs by mouth every 8 (eight) hours as needed for cough. (Patient not taking: Reported on 09/25/2021), Disp: 120 mL, Rfl: 0   omeprazole (PRILOSEC) 20 MG capsule, TAKE 1 CAPSULE BY MOUTH  DAILY, Disp: 90 capsule, Rfl: 3   rOPINIRole (REQUIP) 4 MG tablet, Take 4 mg by mouth at bedtime. , Disp: , Rfl:    rosuvastatin (CRESTOR) 5 MG tablet, TAKE 1 TABLET BY MOUTH  DAILY, Disp: 90 tablet, Rfl: 3   traZODone (DESYREL) 100 MG tablet, Take 50 mg by mouth daily as needed for sleep., Disp: , Rfl:    trimethoprim-polymyxin b (POLYTRIM) ophthalmic solution, Place 2 drops into the right eye every 4 (four) hours., Disp: 10 mL, Rfl: 1   vitamin B-12 (CYANOCOBALAMIN) 1000 MCG tablet, Take 1,000 mcg by mouth daily., Disp: , Rfl:   Social History   Tobacco Use  Smoking Status Former   Packs/day: 0.50   Years: 30.00   Total pack years: 15.00   Types: Cigarettes  Quit date: 07/24/1989   Years since quitting: 32.4  Smokeless Tobacco Never    Allergies  Allergen Reactions   Augmentin [Amoxicillin-Pot Clavulanate] Nausea And Vomiting and Other (See Comments)    Has patient had a PCN reaction causing immediate rash, facial/tongue/throat swelling, SOB or lightheadedness with hypotension: No Has patient had a PCN reaction causing severe rash involving mucus membranes or skin necrosis: No Has patient had a PCN reaction that required hospitalization No Has patient had a PCN reaction occurring within the last 10 years: No If all of the above answers are "NO", then may proceed with Cephalosporin use.   Statins Other (See Comments)    Reaction:  Muscle pain    Objective:  There were no vitals filed for this visit. There is no height or weight on file to calculate  BMI. Constitutional Well developed. Well nourished.  Vascular Dorsalis pedis pulses palpable bilaterally. Posterior tibial pulses palpable bilaterally. Capillary refill normal to all digits.  No cyanosis or clubbing noted. Pedal hair growth normal.  Neurologic Normal speech. Oriented to person, place, and time. Epicritic sensation to light touch grossly present bilaterally.  Dermatologic Nails well groomed and normal in appearance. No open wounds. No skin lesions.  Orthopedic: Bilateral second interspace neuromas.  Positive Mulder's click noted.  Tingling and numbness noted to the digits.  Negative neuromas to other interspaces.  No pain with range of motion of second and third metatarsophalangeal joint   Radiographs: 3 views of skeletally mature adult bilateral foot: No bony abnormalities identified.  Mild case of [place] sign noted.  No intermetatarsal space decreasing noted.  IMPRESSION: 1. 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 2. Mild arthritic changes of the medial hallux sesamoid-metatarsal articulation. 3. A 4.4 mm soft tissue mass along the plantar aspect between the second and third metatarsal heads concerning for a small plantar neuroma. Assessment:   1. 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 Neuroma of second interspace of left foot   3. Encounter for preoperative examination for general surgical procedure      Plan:  Patient was evaluated and treated and all questions answered.  Bilateral second interspace neuroma~recurrence -Clinically he did have some relief from diagnostic block in both interspaces.  However given that there is still tingling in the toes and burning and painful with ambulation especially when going barefooted he would like to discuss treatment options for this.  I discussed sclerosing alcohol injection versus surgical excision.  I discussed  this with both in extensive detail I discussed shoe gear modification with padding as well.  At this time patient would like to discuss surgical removal of both neuromas.  I discussed my preoperative intraoperative postoperative plan for excision of revisional bilateral second interspace neuroma.  Patient states understanding would like to proceed with the surgery.  He will be weightbearing as tolerated in surgical shoe on the by bilateral side -Informed surgical risk consent was reviewed and read aloud to the patient.  I reviewed the films.  I have discussed my findings with the patient in great detail.  I have discussed all risks including but not limited to infection, stiffness, scarring, limp, disability, deformity, damage to blood vessels and nerves, numbness, poor healing, need for braces, arthritis, chronic pain, amputation, death.  All benefits and realistic expectations discussed in great detail.  I have made no promises as to the outcome.  I have provided realistic expectations.  I have  offered the patient a 2nd opinion, which they have declined and assured me they preferred to proceed despite the risks    No follow-ups on file.

## 2022-01-17 ENCOUNTER — Telehealth: Payer: Self-pay | Admitting: Urology

## 2022-01-17 NOTE — Telephone Encounter (Signed)
DOS - 02/11/22  NEURECTOMY BILAT --- [id number]  [employer] EFFECTIVE DATE - 07/08/21  PLAN DEDUCTIBLE - $0.00 OUT OF POCKET - $900.00 W/ $401.90 REMAINING COINSURANCE - 0% COPAY - $100.00  PER [employer] WEB SITE FOR CPT CODE 14970 Notification or Prior Authorization is not required for the requested services  This [employer] Advantage members plan does not currently require a prior authorization for these services. If you have general questions about the prior authorization requirements, please call us at [phone] or visit [email or web address] > Clinician Resources > Advance and Admission Notification Requirements. The number above acknowledges your notification. Please write this number down for future reference. Notification is not a guarantee of coverage or payment.  Decision ID #:[id number]

## 2022-02-11 ENCOUNTER — Other Ambulatory Visit: Payer: Self-pay | Admitting: Podiatry

## 2022-02-11 ENCOUNTER — Encounter: Payer: Self-pay | Admitting: Podiatry

## 2022-02-11 DIAGNOSIS — G5762 Lesion of plantar nerve, left lower limb: Secondary | ICD-10-CM | POA: Diagnosis not present

## 2022-02-11 DIAGNOSIS — G5761 Lesion of plantar nerve, right lower limb: Secondary | ICD-10-CM | POA: Diagnosis not present

## 2022-02-11 MED ORDER — OXYCODONE-ACETAMINOPHEN 5-325 MG PO TABS: 1.0000 | ORAL_TABLET | ORAL | 0 refills | Status: DC | PRN

## 2022-02-20 ENCOUNTER — Ambulatory Visit (INDEPENDENT_AMBULATORY_CARE_PROVIDER_SITE_OTHER): Payer: Medicare Other | Admitting: Podiatry

## 2022-02-20 DIAGNOSIS — G5762 Lesion of plantar nerve, left lower limb: Secondary | ICD-10-CM

## 2022-02-20 DIAGNOSIS — G5761 Lesion of plantar nerve, right lower limb: Secondary | ICD-10-CM

## 2022-02-20 NOTE — Progress Notes (Signed)
Subjective:  Patient ID: [name], male    DOB: 1946/01/03,  MRN: [id number]  Chief Complaint  Patient presents with   Routine Post Op    DOS: 02/11/2022 Procedure: Bilateral second interspace neuroma  76 y.o. male returns for post-op check.  Patient states that he is doing okay.  Pain is controlled.  He is still feeling a little bit of the knot but feels a lot better since surgery.  Bandages clean dry and intact  Review of Systems: Negative except as noted in the HPI. Denies N/V/F/Ch.  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 , Disp: , Rfl:    diclofenac (VOLTAREN) 0.1 % ophthalmic solution, Place 1 drop into both eyes 4 (four) times daily., Disp: 5 mL, Rfl: 0   escitalopram (LEXAPRO) 20 MG tablet, Take 2 tablets (40 mg total) by mouth at bedtime., Disp: 180 tablet, Rfl: 3   ezetimibe (ZETIA) 10 MG tablet, TAKE 1 TABLET BY MOUTH IN  THE MORNING, Disp: 90 tablet, Rfl: 3   fluticasone (FLONASE) 50 MCG/ACT nasal spray, USE 2 SPRAYS IN BOTH  NOSTRILS DAILY, Disp: 48 g, Rfl: 3   gabapentin (NEURONTIN) 300 MG capsule, TAKE 1 CAPSULE BY MOUTH 3  TIMES DAILY AS NEEDED, Disp: 270 capsule, Rfl: 3   HYDROcodone bit-homatropine (HYCODAN)  5-1.5 MG/5ML syrup, Take 5 mLs by mouth every 8 (eight) hours as needed for cough. (Patient not taking: Reported on 09/25/2021), Disp: 120 mL, Rfl: 0   omeprazole (PRILOSEC) 20 MG capsule, TAKE 1 CAPSULE BY MOUTH  DAILY, Disp: 90 capsule, Rfl: 3   oxyCODONE-acetaminophen (PERCOCET) 5-325 MG tablet, Take 1 tablet by mouth every 4 (four) hours as needed for severe pain., Disp: 30 tablet, Rfl: 0   rOPINIRole (REQUIP) 4 MG tablet, Take 4 mg by mouth at bedtime. , Disp: , Rfl:    rosuvastatin (CRESTOR) 5 MG tablet, TAKE 1 TABLET BY MOUTH  DAILY, Disp: 90 tablet, Rfl: 3   traZODone (DESYREL) 100 MG tablet, Take 50 mg by mouth daily as needed for sleep., Disp: , Rfl:    trimethoprim-polymyxin b (POLYTRIM) ophthalmic solution, Place 2 drops into the right eye every 4 (four) hours., Disp: 10 mL, Rfl: 1   vitamin B-12 (CYANOCOBALAMIN) 1000 MCG tablet, Take 1,000 mcg by mouth daily., Disp: , Rfl:   Social History   Tobacco Use  Smoking Status Former   Packs/day: 0.50   Years: 30.00   Total pack years: 15.00   Types: Cigarettes   Quit date: 07/24/1989   Years since quitting: 32.6  Smokeless Tobacco Never  Allergies  Allergen Reactions   Augmentin [Amoxicillin-Pot Clavulanate] Nausea And Vomiting and Other (See Comments)    Has patient had a PCN reaction causing immediate rash, facial/tongue/throat swelling, SOB or lightheadedness with hypotension: No Has patient had a PCN reaction causing severe rash involving mucus membranes or skin necrosis: No Has patient had a PCN reaction that required hospitalization No Has patient had a PCN reaction occurring within the last 10 years: No If all of the above answers are "NO", then may proceed with Cephalosporin use.   Statins Other (See Comments)    Reaction:  Muscle pain    Objective:  There were no vitals filed for this visit. There is no height or weight on file to calculate BMI. Constitutional Well developed. Well nourished.  Vascular Foot warm  and well perfused. Capillary refill normal to all digits.   Neurologic Normal speech. Oriented to person, place, and time. Epicritic sensation to light touch grossly present bilaterally.  Dermatologic Skin healing well without signs of infection. Skin edges well coapted without signs of infection.  Orthopedic: Tenderness to palpation noted about the surgical site.   Radiographs: None Assessment:   1. 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 Neuroma of second interspace of left foot    Plan:  Patient was evaluated and treated and all questions answered.  S/p foot surgery bilaterally -Progressing as expected post-operatively. -XR: See above -WB Status: Weightbearing as tolerated in surgical shoe -Sutures: Intact.  No signs of Deis is noted.  No complication noted. -Medications: None -Foot redressed.  No follow-ups on file.

## 2022-02-26 ENCOUNTER — Ambulatory Visit: Payer: 59 | Admitting: Family Medicine

## 2022-03-01 ENCOUNTER — Encounter (HOSPITAL_BASED_OUTPATIENT_CLINIC_OR_DEPARTMENT_OTHER): Payer: Self-pay | Admitting: Emergency Medicine

## 2022-03-01 ENCOUNTER — Ambulatory Visit: Payer: Self-pay

## 2022-03-01 ENCOUNTER — Emergency Department (HOSPITAL_BASED_OUTPATIENT_CLINIC_OR_DEPARTMENT_OTHER): Payer: Medicare Other | Admitting: Radiology

## 2022-03-01 ENCOUNTER — Emergency Department (HOSPITAL_BASED_OUTPATIENT_CLINIC_OR_DEPARTMENT_OTHER)
Admission: EM | Admit: 2022-03-01 | Discharge: 2022-03-01 | Disposition: A | Discharge status: Home / Self Care | Payer: Medicare Other | Attending: Emergency Medicine | Admitting: Emergency Medicine

## 2022-03-01 ENCOUNTER — Other Ambulatory Visit: Payer: Self-pay | Admitting: Family Medicine

## 2022-03-01 ENCOUNTER — Other Ambulatory Visit: Payer: Self-pay

## 2022-03-01 DIAGNOSIS — R531 Weakness: Secondary | ICD-10-CM | POA: Insufficient documentation

## 2022-03-01 DIAGNOSIS — Z8551 Personal history of malignant neoplasm of bladder: Secondary | ICD-10-CM | POA: Diagnosis not present

## 2022-03-01 DIAGNOSIS — N390 Urinary tract infection, site not specified: Secondary | ICD-10-CM | POA: Diagnosis not present

## 2022-03-01 DIAGNOSIS — Z20822 Contact with and (suspected) exposure to covid-19: Secondary | ICD-10-CM | POA: Insufficient documentation

## 2022-03-01 DIAGNOSIS — R509 Fever, unspecified: Secondary | ICD-10-CM | POA: Diagnosis present

## 2022-03-01 LAB — CBC WITH DIFFERENTIAL/PLATELET
Abs Immature Granulocytes: 0.03 10*3/uL (ref 0.00–0.07)
Basophils Absolute: 0 10*3/uL (ref 0.0–0.1)
Basophils Relative: 1 %
Eosinophils Absolute: 0.1 10*3/uL (ref 0.0–0.5)
Eosinophils Relative: 1 %
HCT: 39.9 % (ref 39.0–52.0)
Hemoglobin: 13 g/dL (ref 13.0–17.0)
Immature Granulocytes: 0 %
Lymphocytes Relative: 14 %
Lymphs Abs: 1.2 10*3/uL (ref 0.7–4.0)
MCH: 30.2 pg (ref 26.0–34.0)
MCHC: 32.6 g/dL (ref 30.0–36.0)
MCV: 92.6 fL (ref 80.0–100.0)
Monocytes Absolute: 0.8 10*3/uL (ref 0.1–1.0)
Monocytes Relative: 9 %
Neutro Abs: 6.7 10*3/uL (ref 1.7–7.7)
Neutrophils Relative %: 75 %
Platelets: 223 10*3/uL (ref 150–400)
RBC: 4.31 MIL/uL (ref 4.22–5.81)
RDW: 13.3 % (ref 11.5–15.5)
WBC: 8.9 10*3/uL (ref 4.0–10.5)
nRBC: 0 % (ref 0.0–0.2)

## 2022-03-01 LAB — URINALYSIS, ROUTINE W REFLEX MICROSCOPIC
Bilirubin Urine: NEGATIVE
Glucose, UA: NEGATIVE mg/dL
Ketones, ur: NEGATIVE mg/dL
Nitrite: NEGATIVE
Protein, ur: 100 mg/dL — AB
RBC / HPF: >50 RBC/hpf — ABNORMAL HIGH (ref 0–5)
Specific Gravity, Urine: 1.015 (ref 1.005–1.030)
WBC, UA: >50 WBC/hpf — ABNORMAL HIGH (ref 0–5)
pH: 6.5 (ref 5.0–8.0)

## 2022-03-01 LAB — COMPREHENSIVE METABOLIC PANEL
ALT: 11 U/L (ref 0–44)
AST: 16 U/L (ref 15–41)
Albumin: 4 g/dL (ref 3.5–5.0)
Alkaline Phosphatase: 96 U/L (ref 38–126)
Anion gap: 10 (ref 5–15)
BUN: 24 mg/dL — ABNORMAL HIGH (ref 8–23)
CO2: 24 mmol/L (ref 22–32)
Calcium: 8.5 mg/dL — ABNORMAL LOW (ref 8.9–10.3)
Chloride: 103 mmol/L (ref 98–111)
Creatinine, Ser: 2.19 mg/dL — ABNORMAL HIGH (ref 0.61–1.24)
GFR, Estimated: 31 mL/min — ABNORMAL LOW (ref >60)
Glucose, Bld: 99 mg/dL (ref 70–99)
Potassium: 4.2 mmol/L (ref 3.5–5.1)
Sodium: 137 mmol/L (ref 135–145)
Total Bilirubin: 0.4 mg/dL (ref 0.3–1.2)
Total Protein: 7.5 g/dL (ref 6.5–8.1)

## 2022-03-01 LAB — SARS CORONAVIRUS 2 BY RT PCR: SARS Coronavirus 2 by RT PCR: NEGATIVE

## 2022-03-01 LAB — LACTIC ACID, PLASMA: Lactic Acid, Venous: 1.2 mmol/L (ref 0.5–1.9)

## 2022-03-01 MED ORDER — ACETAMINOPHEN 325 MG PO TABS
650.0000 mg | ORAL_TABLET | Freq: Once | ORAL | Status: AC
  Administered 2022-03-01: 650 mg via ORAL
  Filled 2022-03-01: qty 2

## 2022-03-01 MED ORDER — SODIUM CHLORIDE 0.9 % IV SOLN: INTRAVENOUS | Status: DC | PRN

## 2022-03-01 MED ORDER — CEPHALEXIN 500 MG PO CAPS: 500.0000 mg | ORAL_CAPSULE | Freq: Four times a day (QID) | ORAL | 0 refills | Status: AC

## 2022-03-01 MED ORDER — SODIUM CHLORIDE 0.9 % IV SOLN
1.0000 g | Freq: Once | INTRAVENOUS | Status: AC
  Administered 2022-03-01: 1 g via INTRAVENOUS
  Filled 2022-03-01: qty 10

## 2022-03-01 MED ORDER — SULFAMETHOXAZOLE-TRIMETHOPRIM 800-160 MG PO TABS: 1.0000 | ORAL_TABLET | Freq: Two times a day (BID) | ORAL | 0 refills | Status: DC

## 2022-03-01 MED ORDER — SODIUM CHLORIDE 0.9 % IV BOLUS
1000.0000 mL | Freq: Once | INTRAVENOUS | Status: AC
  Administered 2022-03-01: 1000 mL via INTRAVENOUS

## 2022-03-01 NOTE — ED Provider Notes (Signed)
[facility] EMERGENCY DEPT Provider Note   CSN: [id number] Arrival date & time: 03/01/22  1236     History  Chief Complaint  Patient presents with   Fever    [name] is a 76 y.o. male.  With past medical history of hyperlipidemia, GERD, history of bladder cancer s/p cystectomy and left nephrectomy with remaining right kidney and urostomy who presents to the emergency department with fever.  States that symptoms began on Wednesday night.  He describes having rigors and then profusely sweating.  He states that last night he again around 9 PM started having shaking and took his temp which was around 101.5.  He describes having associated weakness.  He states that over the past 4 days he has noticed malodorous urine from his urostomy bag and for the past 2 days states that his urine has been darker than normal.  He denies any abdominal pain, nausea or vomiting, chest pain, cough, shortness of breath, flank pain or diarrhea.   Fever Associated symptoms: chills        Home Medications Prior to Admission medications   Medication Sig Start Date End Date Taking? Authorizing Provider  allopurinol (ZYLOPRIM) 300 MG tablet TAKE 1 TABLET BY MOUTH  DAILY 03/28/21   [name], MD  azithromycin [facility]) 250 MG tablet 2 tabs poqday1, 1 tab poqday 2-5 Patient not taking: Reported on 09/25/2021 08/31/21   [name], MD  clonazePAM (KLONOPIN) 0.5 MG tablet Take 0.5 mg by mouth 2 (two) times daily as needed. 09/17/19   [provider]  colchicine 0.6 MG tablet Take 0.6-1.2 mg by mouth 2 (two) times daily as needed (for gout flare).     [provider]  diclofenac (VOLTAREN) 0.1 % ophthalmic solution Place 1 drop into both eyes 4 (four) times daily. 10/02/21   [name], MD  escitalopram (LEXAPRO) 20 MG tablet Take 2 tablets (40 mg total) by mouth at bedtime. 02/25/17   [name], MD  ezetimibe (ZETIA) 10 MG tablet TAKE 1 TABLET BY MOUTH  IN  THE MORNING 05/11/21   [name], MD  fluticasone [facility]) 50 MCG/ACT nasal spray USE 2 SPRAYS IN BOTH  NOSTRILS DAILY 02/15/21   [name], MD  gabapentin (NEURONTIN) 300 MG capsule TAKE 1 CAPSULE BY MOUTH 3  TIMES DAILY AS NEEDED 10/24/21   [name], MD  HYDROcodone bit-homatropine (HYCODAN) 5-1.5 MG/5ML syrup Take 5 mLs by mouth every 8 (eight) hours as needed for cough. Patient not taking: Reported on 09/25/2021 08/31/21   [name], MD  omeprazole (PRILOSEC) 20 MG capsule TAKE 1 CAPSULE BY MOUTH  DAILY 06/21/21   [name], MD  oxyCODONE-acetaminophen (PERCOCET) 5-325 MG tablet Take 1 tablet by mouth every 4 (four) hours as needed for severe pain. 02/11/22   Felipa Furnace, DPM  rOPINIRole (REQUIP) 4 MG tablet Take 4 mg by mouth at bedtime.  05/07/18   [provider]  rosuvastatin (CRESTOR) 5 MG tablet TAKE 1 TABLET BY MOUTH  DAILY 04/02/21   [name], MD  traZODone (DESYREL) 100 MG tablet Take 50 mg by mouth daily as needed for sleep.    [provider]  trimethoprim-polymyxin b (POLYTRIM) ophthalmic solution Place 2 drops into the right eye every 4 (four) hours. 09/25/21   [name], MD  vitamin B-12 (CYANOCOBALAMIN) 1000 MCG tablet Take 1,000 mcg by mouth daily.    [provider]  Allergies    Augmentin [amoxicillin-pot clavulanate], Statins, and Nystatin    Review of Systems   Review of Systems  Constitutional:  Positive for chills, fatigue and fever.  Genitourinary:        Concentrated urine and foul smelling   All other systems reviewed and are negative.   Physical Exam Updated Vital Signs BP 134/70 (BP Location: Right Arm)   Pulse 80   Temp 99.3 F (37.4 C)   Resp 16   Ht '5\' 9"'$  (1.753 m)   Wt 99.8 kg   SpO2 97%   BMI 32.49 kg/m  Physical Exam Vitals and nursing note reviewed.  Constitutional:      General: He is not in acute distress.    Appearance: Normal appearance. He is  normal weight. He is ill-appearing. He is not toxic-appearing.  HENT:     Head: Normocephalic.     Nose: Nose normal.     Mouth/Throat:     Mouth: Mucous membranes are moist.     Pharynx: Oropharynx is clear.  Eyes:     General: No scleral icterus.    Extraocular Movements: Extraocular movements intact.     Pupils: Pupils are equal, round, and reactive to light.  Cardiovascular:     Rate and Rhythm: Normal rate and regular rhythm.     Pulses: Normal pulses.  Pulmonary:     Effort: Pulmonary effort is normal. No respiratory distress.     Breath sounds: Normal breath sounds.  Abdominal:     General: Abdomen is protuberant. The ostomy site is clean. Bowel sounds are normal. There is no distension.     Palpations: Abdomen is soft.     Tenderness: There is no abdominal tenderness. There is no right CVA tenderness or left CVA tenderness.    Musculoskeletal:        General: Normal range of motion.     Cervical back: Neck supple.  Skin:    General: Skin is warm and dry.     Capillary Refill: Capillary refill takes less than 2 seconds.  Neurological:     General: No focal deficit present.     Mental Status: He is alert and oriented to person, place, and time. Mental status is at baseline.  Psychiatric:        Mood and Affect: Mood normal.        Behavior: Behavior normal.        Thought Content: Thought content normal.        Judgment: Judgment normal.     ED Results / Procedures / Treatments   Labs (all labs ordered are listed, but only abnormal results are displayed) Labs Reviewed  URINALYSIS, ROUTINE W REFLEX MICROSCOPIC - Abnormal; Notable for the following components:      Result Value   APPearance HAZY (*)    Hgb urine dipstick LARGE (*)    Protein, ur 100 (*)    Leukocytes,Ua MODERATE (*)    RBC / HPF >50 (*)    WBC, UA >50 (*)    Bacteria, UA RARE (*)    All other components within normal limits  COMPREHENSIVE METABOLIC PANEL - Abnormal; Notable for the following  components:   BUN 24 (*)    Creatinine, Ser 2.19 (*)    Calcium 8.5 (*)    GFR, Estimated 31 (*)    All other components within normal limits  SARS CORONAVIRUS 2 BY RT PCR  URINE CULTURE  CULTURE, BLOOD (SINGLE)  CBC WITH DIFFERENTIAL/PLATELET  LACTIC  ACID, PLASMA   EKG None  Radiology DG Chest 2 View  Result Date: 03/01/2022 CLINICAL DATA:  Cough, fever. EXAM: CHEST - 2 VIEW COMPARISON:  October 15, 2021. FINDINGS: The heart size and mediastinal contours are within normal limits. Both lungs are clear. The visualized skeletal structures are unremarkable. IMPRESSION: No active cardiopulmonary disease. Electronically Signed   By: Marijo Conception M.D.   On: 03/01/2022 16:39    Procedures Procedures   Medications Ordered in ED Medications  0.9 %  sodium chloride infusion (0 mLs Intravenous Stopped 03/01/22 1807)  acetaminophen (TYLENOL) tablet 650 mg (650 mg Oral Given 03/01/22 1638)  sodium chloride 0.9 % bolus 1,000 mL (0 mLs Intravenous Stopped 03/01/22 1807)  cefTRIAXone (ROCEPHIN) 1 g in sodium chloride 0.9 % 100 mL IVPB (0 g Intravenous Stopped 03/01/22 1807)    ED Course/ Medical Decision Making/ A&P Clinical Course as of 03/01/22 1709  Fri Mar 01, 2022  1622 This is a 76 year old male with a history of a single kidney with a urostomy, prostate cancer, appendectomy, presenting to the ED from home with 2 days of fevers, chills, weakness.  Symptoms began 2 nights ago.  Worse at night with temperature elevating.  He reports he has felt weak.  He also had a persistent dry cough overnight last night, which was unusual for him.  He denies headache.  Denies nausea, vomiting, diarrhea.  On exam he is overall well-appearing with normal vital signs.  UA has leukocytes and white blood cells noted, will send urine culture.  Negative nitrites.  COVID test negative.  There is still some concern that the patient may have a bacterial infection, that he may be experiencing rigors, we are awaiting blood  work including a lactate level, CBC, CMP.  I think there is reason to treat for possible UTI at this point with his urostomy, and I have ordered some Rocephin here as well as a liter of fluid.  X-rays are also pending. [MT]    Clinical Course User Index [MT] Trifan, Carola Rhine, MD                           Medical Decision Making Amount and/or Complexity of Data Reviewed Labs: ordered. Radiology: ordered.  Risk OTC drugs. Prescription drug management.  This patient presents to the ED with chief complaint(s) of fever and concentrated urine with pertinent past medical history of urostomy s/p cystectomy and nephrectomy which further complicates the presenting complaint. The complaint involves an extensive differential diagnosis and treatment options and also carries with it a high risk of complications and morbidity.    The differential diagnosis includes UTI, pyelonephritis, stone, obstructed stone, infected stone sepsis, Acute hepatobiliary disease, pancreatitis, appendicitis, PUD, gastritis, SBO, diverticulitis, colitis, viral gastroenteritis, Crohn's, UC, vascular catastrophe, UTI, pyelonephritis, renal stone, obstructed stone, infected stone, testicular torsion, epididymitis, incarcerated hernia, STD, etc.    Additional history obtained: Additional history obtained from spouse Records reviewed Care Everywhere/External Records  ED Course: Lab Tests: I Ordered, and personally interpreted labs.  The pertinent results include:   CBC without leukocytosis Lactic 1.2, blood culture pending COVID-negative CMP with creatinine of 2.19 (most recent 1.85) UA with UTI, culture sent Imaging Studies: I ordered and independently visualized and interpreted the following imaging X-ray chest   which showed no evidence of pneumonia The interpretation of the imaging was limited to assessing for emergent pathology, for which purpose it was ordered. Cardiac Monitoring: The patient was maintained  on a  cardiac monitor.  I personally viewed and interpreted the cardiac monitor which showed an underlying rhythm of:  sinus rhythm Medicines ordered and prescription drug management: I ordered the following medications 1 L saline bolus, 1 g of Rocephin for UTI, Tylenol for fever I considered this additional medications: N/A Reevaluation of the patient after these medicines showed that the patient    improved  Reassessment and review  76 year old male who presents to the emergency department with fever, concentrated urine, malodorous urine.  Significant history of cystectomy, nephrectomy with single kidney and urostomy. Overall is well-appearing and nontoxic in appearance.  His vitals are stable although he is febrile here in the emergency department. No abdominal tenderness, CVA tenderness Labs are notable for slightly elevated creatinine from most recent at 2.19.  He does have a UTI.  Urine culture was sent.  Given his fever and significant history, obtain lactic which was negative, blood culture pending. Also given his past history of, obtain chest x-ray to rule out a pneumonia was negative.  COVID is also negative.  In a liter of IV fluids given his elevated creatinine and a gram of Rocephin. I discussed this case with my attending physician who cosigned this note including patient's presenting symptoms, physical exam, and planned diagnostics and interventions. Attending physician stated agreement with plan or made changes to plan which were implemented.   Attending physician assessed patient at bedside.   Feel that the patient would be appropriate for outpatient p.o. antibiotic trial after receiving IV antibiotics here in the emergency department.  Offered observation overnight but patient prefers home and I feel like this is reasonable. We will place him on Keflex for 10 days for complicated UTI.  He is agreeable to this plan with understanding of strict return precautions should he have worsening  symptoms.  Also instructed to follow-up with allergy and PCP.  (Do not feel that his symptoms are associated with other etiology of fever such as pneumonia, wound infection, intra-abdominal infection such as pyelo-.  He has no CVA tenderness.  No GI symptoms to be concerned about acute gastroenteritis, symptoms are inconsistent with an appendicitis, pancreatitis, UC, Crohn's, diverticulitis.  Labs with no transaminitis concerning for acute hepatobiliary disease.)  Consultations Obtained: No consultations  Complexity of problems addressed: Patient's presentation is most consistent with  acute illness/injury with systemic symptoms During patient's assessment  Disposition: After consideration of the diagnostic results and the patient's response to treatment,  I feel that the patent would benefit from discharge with urology, PCP follow-up .  Social Determinants of Health: Patient's  none identified   increases the complexity of managing their presentation  Final Clinical Impression(s) / ED Diagnoses Final diagnoses:  Complicated UTI (urinary tract infection)    Rx / DC Orders ED Discharge Orders          Ordered    cephALEXin (KEFLEX) 500 MG capsule  4 times daily        03/01/22 1754              Mickie Hillier, PA-C 03/01/22 1850    Wyvonnia Dusky, MD 03/01/22 2033

## 2022-03-01 NOTE — Discharge Instructions (Signed)
You were seen in the emergency department today for a UTI.  We are placing you on antibiotics that you will take 4 times a day over the next 10 days.  Please follow-up with your urologist.  Please return immediately if you begin to have worsening symptoms such as fever that is not responsive to Tylenol, abdominal pain, decreased urine output.  Please use Tylenol or Motrin for fevers at home.  Please drink plenty of fluids.

## 2022-03-01 NOTE — ED Notes (Signed)
Patient verbalizes understanding of discharge instructions. Opportunity for questioning and answers were provided. Patient discharged from ED.  °

## 2022-03-01 NOTE — ED Triage Notes (Signed)
Pt arrives to ED with c/o fever and chills that started x2 days ago. No N/V/D. Hx urostomy.

## 2022-03-01 NOTE — Telephone Encounter (Signed)
  Chief Complaint: Fever. Urine odor Symptoms: ibid Frequency: 2 nights Pertinent Negatives: Patient denies other s/s Disposition: '[]'$ ED /'[x]'$ Urgent Care (no appt availability in office) / '[]'$ Appointment(In office/virtual)/ '[]'$  [facility]/ '[]'$ Home Care/ '[]'$ Refused Recommended Disposition /'[]'$ [facility]/ '[]'$  Follow-up with PCP Additional Notes: Pt has had chills and fever the past 2 nights. Fever resolves by morning. Pt also reports off smell to urine. PT has 1 kidney and Urostomy.  Reason for Disposition  Fever > 104 F (40 C)  Answer Assessment - Initial Assessment Questions 1. TEMPERATURE: "What is the most recent temperature?"  "How was it measured?"      96.8  2. ONSET: "When did the fever start?"      2 nights ago 3. CHILLS: "Do you have chills?" If yes: "How bad are they?"  (e.g., none, mild, moderate, severe)   - NONE: no chills   - MILD: feeling cold   - MODERATE: feeling very cold, some shivering (feels better under a thick blanket)   - SEVERE: feeling extremely cold with shaking chills (general body shaking, rigors; even under a thick blanket)      Mild 4. OTHER SYMPTOMS: "Do you have any other symptoms besides the fever?"  (e.g., abdomen pain, cough, diarrhea, earache, headache, sore throat, urination pain)     none 5. CAUSE: If there are no symptoms, ask: "What do you think is causing the fever?"      UTI 6. CONTACTS: "Does anyone else in the family have an infection?"     no 7. TREATMENT: "What have you done so far to treat this fever?" (e.g., medications)     Tylenol 8. IMMUNOCOMPROMISE: "Do you have of the following: diabetes, HIV positive, splenectomy, cancer chemotherapy, chronic steroid treatment, transplant patient, etc."     no 9. PREGNANCY: "Is there any chance you are pregnant?" "When was your last menstrual period?"     na 10. TRAVEL: "Have you traveled out of the country in the last month?" (e.g., travel history, exposures)        na  Protocols used: [facility]

## 2022-03-02 ENCOUNTER — Other Ambulatory Visit: Payer: Self-pay | Admitting: Family Medicine

## 2022-03-03 LAB — URINE CULTURE: Culture: >=100000 — AB

## 2022-03-04 ENCOUNTER — Telehealth: Payer: Self-pay

## 2022-03-04 NOTE — Telephone Encounter (Signed)
Requested medication (s) are due for refill today: yes (mail order)  Requested medication (s) are on the active medication list: yes  Last refill:  Zetia 05/11/2021 #90 with 3 RF, Crestor 04/02/2021 #90 with 3 RF, Zyloprim 03/28/2021 #90 with 3 RF  Future visit scheduled: no, seen multiple times at beginning of year, seen 5 months ago.  Notes to clinic:  Lipids and uric acid out of date, please assess.       Requested Prescriptions  Pending Prescriptions Disp Refills   ezetimibe (ZETIA) 10 MG tablet [Pharmacy Med Name: Ezetimibe 10 MG Oral Tablet] 90 tablet 3    Sig: TAKE 1 TABLET BY MOUTH IN  THE MORNING     Cardiovascular:  Antilipid - Sterol Transport Inhibitors Failed - 03/02/2022 10:20 PM      Failed - Lipid Panel in normal range within the last 12 months    Cholesterol  Date Value Ref Range Status  05/04/2020 100 <200 mg/dL Final   LDL Cholesterol (Calc)  Date Value Ref Range Status  05/04/2020 39 mg/dL (calc) Final    Comment:    Reference range: <100 . Desirable range <100 mg/dL for primary prevention;   <70 mg/dL for patients with CHD or diabetic patients  with > or = 2 CHD risk factors. [name] LDL-C is now calculated using the Martin-Hopkins  calculation, which is a validated novel method providing  better accuracy than the Friedewald equation in the  estimation of LDL-C.  Cresenciano Genre et al. Annamaria Helling. 3220;254(27): 2061-2068  (http://education.[facility].com/faq/FAQ164)    HDL  Date Value Ref Range Status  05/04/2020 40 > OR = 40 mg/dL Final   Triglycerides  Date Value Ref Range Status  05/04/2020 125 <150 mg/dL Final         Passed - AST in normal range and within 360 days    AST  Date Value Ref Range Status  03/01/2022 16 15 - 41 U/L Final  12/26/2015 16 5 - 34 U/L Final         Passed - ALT in normal range and within 360 days    ALT  Date Value Ref Range Status  03/01/2022 11 0 - 44 U/L Final  12/26/2015 17 0 - 55 U/L Final         Passed -  Patient is not pregnant      Passed - Valid encounter within last 12 months    Recent Outpatient Visits           5 months ago Acute bacterial conjunctivitis of right eye   [place] [name], [name], MD   6 months ago [place] [facility]enn[facility] Schaumann, [name], MD   7 months ago Upper airway cough syndrome   [place] [name], [name], MD   1 year ago Prediabetes   [place] [facility] Schaumann, [name], MD   1 year ago History of prostate cancer   [place] [name], [name], MD               rosuvastatin (CRESTOR) 5 MG tablet [Pharmacy Med Name: Rosuvastatin Calcium 5 MG Oral Tablet] 90 tablet 3    Sig: TAKE 1 TABLET BY MOUTH  DAILY     Cardiovascular:  Antilipid - Statins 2 Failed - 03/02/2022 10:20 PM      Failed - Cr in normal range and within 360 days    Creatinine  Date Value Ref Range Status  12/26/2015 2.3 (H) 0.7 - 1.3 mg/dL Final   Creat  Date Value Ref Range Status  02/23/2021 1.85 (H) 0.70 - 1.28 mg/dL Final   Creatinine, Ser  Date Value Ref Range Status  03/01/2022 2.19 (H) 0.61 - 1.24 mg/dL Final         Failed - Lipid Panel in normal range within the last 12 months    Cholesterol  Date Value Ref Range Status  05/04/2020 100 <200 mg/dL Final   LDL Cholesterol (Calc)  Date Value Ref Range Status  05/04/2020 39 mg/dL (calc) Final    Comment:    Reference range: <100 . Desirable range <100 mg/dL for primary prevention;   <70 mg/dL for patients with CHD or diabetic patients  with > or = 2 CHD risk factors. [name] LDL-C is now calculated using the Martin-Hopkins  calculation, which is a validated novel method providing  better accuracy than the Friedewald equation in the  estimation of LDL-C.  Cresenciano Genre et al. Annamaria Helling. 7425;956(38): 2061-2068  (http://education.[facility].com/faq/FAQ164)    HDL  Date Value Ref Range Status  05/04/2020 40 > OR = 40 mg/dL Final    Triglycerides  Date Value Ref Range Status  05/04/2020 125 <150 mg/dL Final         Passed - Patient is not pregnant      Passed - Valid encounter within last 12 months    Recent Outpatient Visits           5 months ago Acute bacterial conjunctivitis of right eye   [place] [name], MD   6 months ago [place] Dennard Schaumann, [name], MD   7 months ago Upper airway cough syndrome   [place] [name], [name], MD   1 year ago Prediabetes   [place] [name], MD   1 year ago History of prostate cancer   [place] [name], [name], MD               allopurinol (ZYLOPRIM) 300 MG tablet [Pharmacy Med Name: Allopurinol 300 MG Oral Tablet] 90 tablet 3    Sig: TAKE 1 TABLET BY MOUTH  DAILY     Endocrinology:  Gout Agents - allopurinol Failed - 03/02/2022 10:20 PM      Failed - Uric Acid in normal range and within 360 days    Uric Acid, Serum  Date Value Ref Range Status  06/16/2014 10.2 (H) 4.0 - 7.8 mg/dL Final         Failed - Cr in normal range and within 360 days    Creatinine  Date Value Ref Range Status  12/26/2015 2.3 (H) 0.7 - 1.3 mg/dL Final   Creat  Date Value Ref Range Status  02/23/2021 1.85 (H) 0.70 - 1.28 mg/dL Final   Creatinine, Ser  Date Value Ref Range Status  03/01/2022 2.19 (H) 0.61 - 1.24 mg/dL Final         Passed - Valid encounter within last 12 months    Recent Outpatient Visits           5 months ago Acute bacterial conjunctivitis of right eye   [place] [name], MD   6 months ago [place] [name], MD   7 months ago Upper airway cough syndrome   [facility] [name], [name], MD   1 year  ago Prediabetes   [place] [name], [name], MD   1 year ago History of prostate cancer   [place] [name], [name], MD              Passed - CBC within normal limits and completed in the last 12 months    WBC  Date Value Ref Range Status  03/01/2022 8.9 4.0 - 10.5 K/uL Final   RBC  Date Value Ref Range Status  03/01/2022 4.31 4.22 - 5.81 MIL/uL Final   Hemoglobin  Date Value Ref Range Status  03/01/2022 13.0 13.0 - 17.0 g/dL Final  12/03/2019 13.3 13.0 - 17.7 g/dL Final   HGB  Date Value Ref Range Status  12/26/2015 13.4 13.0 - 17.1 g/dL Final   HCT  Date Value Ref Range Status  03/01/2022 39.9 39.0 - 52.0 % Final  12/26/2015 40.9 38.4 - 49.9 % Final   Hematocrit  Date Value Ref Range Status  12/03/2019 38.0 37.5 - 51.0 % Final   MCHC  Date Value Ref Range Status  03/01/2022 32.6 30.0 - 36.0 g/dL Final   [facility]  Date Value Ref Range Status  03/01/2022 30.2 26.0 - 34.0 pg Final   MCV  Date Value Ref Range Status  03/01/2022 92.6 80.0 - 100.0 fL Final  12/03/2019 91 79 - 97 fL Final  12/26/2015 90.5 79.3 - 98.0 fL Final   No results found for: "PLTCOUNTKUC", "LABPLAT", "POCPLA" RDW  Date Value Ref Range Status  03/01/2022 13.3 11.5 - 15.5 % Final  12/03/2019 13.3 11.6 - 15.4 % Final  12/26/2015 14.0 11.0 - 14.6 % Final

## 2022-03-04 NOTE — Telephone Encounter (Signed)
Post ED Visit - Positive Culture Follow-up  Culture report reviewed by antimicrobial stewardship pharmacist: [place] Team '[x]'$  Esmeralda Arthur, Pharm.D. '[]'$  Heide Guile, Pharm.D., BCPS AQ-ID '[]'$  Parks Neptune, Pharm.D., BCPS '[]'$  Alycia Rossetti, Pharm.D., BCPS '[]'$  [place], Pharm.D., BCPS, AAHIVP '[]'$  Legrand Como, Pharm.D., BCPS, AAHIVP '[]'$  Salome Arnt, PharmD, BCPS '[]'$  Johnnette Gourd, PharmD, BCPS '[]'$  Hughes Better, PharmD, BCPS '[]'$  Leeroy Cha, PharmD '[]'$  Laqueta Linden, PharmD, BCPS '[]'$  Albertina Parr, PharmD  [place] Team '[]'$  Leodis Sias, PharmD '[]'$  Lindell Spar, PharmD '[]'$  Royetta Asal, PharmD '[]'$  Graylin Shiver, Rph '[]'$  Rema Fendt) Glennon Mac, PharmD '[]'$  Arlyn Dunning, PharmD '[]'$  Netta Cedars, PharmD '[]'$  Dia Sitter, PharmD '[]'$  Leone Haven, PharmD '[]'$  Gretta Arab, PharmD '[]'$  Theodis Shove, PharmD '[]'$  Peggyann Juba, PharmD '[]'$  Reuel Boom, PharmD   Positive urine culture Treated with Cephalexin, organism sensitive to the same and no further patient follow-up is required at this time.  Glennon Hamilton 03/04/2022, 10:08 AM

## 2022-03-04 NOTE — Telephone Encounter (Signed)
Tried calling pt. No answer, LVM regarding abx has been sent to pt's pharmacy and ready for pick up.  Also, told pt to called should he has any other questions.

## 2022-03-06 ENCOUNTER — Ambulatory Visit (INDEPENDENT_AMBULATORY_CARE_PROVIDER_SITE_OTHER): Payer: Medicare Other | Admitting: Podiatry

## 2022-03-06 DIAGNOSIS — G5762 Lesion of plantar nerve, left lower limb: Secondary | ICD-10-CM

## 2022-03-06 DIAGNOSIS — Z9889 Other specified postprocedural states: Secondary | ICD-10-CM

## 2022-03-06 DIAGNOSIS — G5761 Lesion of plantar nerve, right lower limb: Secondary | ICD-10-CM

## 2022-03-06 LAB — CULTURE, BLOOD (SINGLE)
Culture: NO GROWTH
Special Requests: ADEQUATE

## 2022-03-06 NOTE — Progress Notes (Signed)
Subjective:  Patient ID: [name], male    DOB: 12/21/1945,  MRN: [id number]  Chief Complaint  Patient presents with   Routine Post Op    POV #2 DOS 02/11/2022 B/L 2ND INTERSPACE NEURECTOMY    DOS: 02/11/2022 Procedure: Bilateral second interspace neuroma  76 y.o. male returns for post-op check.  Patient states that he is doing okay.  Pain is controlled.  He is still feeling a little bit of the knot but feels a lot better since surgery.  Bandages clean dry and intact  Review of Systems: Negative except as noted in the HPI. Denies N/V/F/Ch.  Past Medical History:  Diagnosis Date   Allergy    Bacteremia due to Gram-negative bacteria 04/13/2015   Bladder rupture 2010   Bladder tumor    Cancer [facility])    bladder   Chronic kidney disease    Depression    Gout    History of unilateral nephrectomy 07/2014   [facility]   Hyperlipidemia    Restless leg syndrome, controlled     Current Outpatient Medications:    allopurinol (ZYLOPRIM) 300 MG tablet, TAKE 1 TABLET BY MOUTH  DAILY, Disp: 90 tablet, Rfl: 3   azithromycin (ZITHROMAX) 250 MG tablet, 2 tabs poqday1, 1 tab poqday 2-5 (Patient not taking: Reported on 09/25/2021), Disp: 6 tablet, Rfl: 0   cephALEXin (KEFLEX) 500 MG capsule, Take 1 capsule (500 mg total) by mouth 4 (four) times daily for 10 days., Disp: 40 capsule, Rfl: 0   clonazePAM (KLONOPIN) 0.5 MG tablet, Take 0.5 mg by mouth 2 (two) times daily as needed., Disp: , Rfl:    colchicine 0.6 MG tablet, Take 0.6-1.2 mg by mouth 2 (two) times daily as needed (for gout flare). , Disp: , Rfl:    diclofenac (VOLTAREN) 0.1 % ophthalmic solution, Place 1 drop into both eyes 4 (four) times daily., Disp: 5 mL, Rfl: 0   escitalopram (LEXAPRO) 20 MG tablet, Take 2 tablets (40 mg total) by mouth at bedtime., Disp: 180 tablet, Rfl: 3   ezetimibe (ZETIA) 10 MG tablet, TAKE 1 TABLET BY MOUTH IN THE  MORNING, Disp: 90 tablet, Rfl: 3   fluticasone (FLONASE) 50 MCG/ACT nasal spray, USE 2 SPRAYS IN BOTH   NOSTRILS DAILY, Disp: 48 g, Rfl: 3   gabapentin (NEURONTIN) 300 MG capsule, TAKE 1 CAPSULE BY MOUTH 3  TIMES DAILY AS NEEDED, Disp: 270 capsule, Rfl: 3   HYDROcodone bit-homatropine (HYCODAN) 5-1.5 MG/5ML syrup, Take 5 mLs by mouth every 8 (eight) hours as needed for cough. (Patient not taking: Reported on 09/25/2021), Disp: 120 mL, Rfl: 0   omeprazole (PRILOSEC) 20 MG capsule, TAKE 1 CAPSULE BY MOUTH  DAILY, Disp: 90 capsule, Rfl: 3   oxyCODONE-acetaminophen (PERCOCET) 5-325 MG tablet, Take 1 tablet by mouth every 4 (four) hours as needed for severe pain., Disp: 30 tablet, Rfl: 0   rOPINIRole (REQUIP) 4 MG tablet, Take 4 mg by mouth at bedtime. , Disp: , Rfl:    rosuvastatin (CRESTOR) 5 MG tablet, TAKE 1 TABLET BY MOUTH  DAILY, Disp: 90 tablet, Rfl: 3   sulfamethoxazole-trimethoprim (BACTRIM DS) 800-160 MG tablet, Take 1 tablet by mouth 2 (two) times daily., Disp: 14 tablet, Rfl: 0   traZODone (DESYREL) 100 MG tablet, Take 50 mg by mouth daily as needed for sleep., Disp: , Rfl:    trimethoprim-polymyxin b (POLYTRIM) ophthalmic solution, Place 2 drops into the right eye every 4 (four) hours., Disp: 10 mL, Rfl: 1   vitamin B-12 (CYANOCOBALAMIN)  1000 MCG tablet, Take 1,000 mcg by mouth daily., Disp: , Rfl:   Social History   Tobacco Use  Smoking Status Former   Packs/day: 0.50   Years: 30.00   Total pack years: 15.00   Types: Cigarettes   Quit date: 07/24/1989   Years since quitting: 32.6  Smokeless Tobacco Never    Allergies  Allergen Reactions   Augmentin [Amoxicillin-Pot Clavulanate] Nausea And Vomiting and Other (See Comments)    Has patient had a PCN reaction causing immediate rash, facial/tongue/throat swelling, SOB or lightheadedness with hypotension: No Has patient had a PCN reaction causing severe rash involving mucus membranes or skin necrosis: No Has patient had a PCN reaction that required hospitalization No Has patient had a PCN reaction occurring within the last 10 years:  No If all of the above answers are "NO", then may proceed with Cephalosporin use.   Statins Other (See Comments)    Reaction:  Muscle pain    Nystatin    Objective:  There were no vitals filed for this visit. There is no height or weight on file to calculate BMI. Constitutional Well developed. Well nourished.  Vascular Foot warm and well perfused. Capillary refill normal to all digits.   Neurologic Normal speech. Oriented to person, place, and time. Epicritic sensation to light touch grossly present bilaterally.  Dermatologic Probably reepithelialized.  Reduction of second interspace neuroma noted.  No further pain.  No recurrence noted.  Orthopedic: Tenderness to palpation noted about the surgical site.   Radiographs: None Assessment:   1. 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 Neuroma of second interspace of left foot   3. Status post foot surgery     Plan:  Patient was evaluated and treated and all questions answered.  S/p foot surgery bilaterally -Clinically healed and officially discharged from my care.  Discussed shoe gear modification prevention technique if any foot and ankle issues arise in the future of asked him to come back and see me.  He states understanding.  No follow-ups on file.

## 2022-04-25 ENCOUNTER — Ambulatory Visit (INDEPENDENT_AMBULATORY_CARE_PROVIDER_SITE_OTHER): Payer: Medicare Other | Admitting: Family Medicine

## 2022-04-25 VITALS — BP 120/74 | HR 61 | Temp 99.6°F | Ht 69.0 in | Wt 212.0 lb

## 2022-04-25 DIAGNOSIS — N390 Urinary tract infection, site not specified: Secondary | ICD-10-CM

## 2022-04-25 DIAGNOSIS — R319 Hematuria, unspecified: Secondary | ICD-10-CM

## 2022-04-25 LAB — URINALYSIS, ROUTINE W REFLEX MICROSCOPIC
Bilirubin Urine: NEGATIVE
Casts: NONE SEEN /LPF
Crystals: NONE SEEN /HPF
Glucose, UA: NEGATIVE
Hyaline Cast: NONE SEEN /LPF
Ketones, ur: NEGATIVE
Nitrite: POSITIVE — AB
Specific Gravity, Urine: 1.015 (ref 1.001–1.035)
Yeast: NONE SEEN /HPF
pH: 6 (ref 5.0–8.0)

## 2022-04-25 LAB — MICROSCOPIC MESSAGE

## 2022-04-25 MED ORDER — CEPHALEXIN 500 MG PO CAPS: 500.0000 mg | ORAL_CAPSULE | Freq: Four times a day (QID) | ORAL | 0 refills | Status: DC

## 2022-04-25 NOTE — Progress Notes (Signed)
Subjective:    Patient ID: [name], male    DOB: [birth year], 76 y.o.   MRN: [id number]  Patient presents today concerned that he has a urinary tract infection.  He has a history of bladder cancer and has an ileal conduit with a cystostomy bag.  He reports that for the last few days, his urine has had a foul odor.  He also reports low back pain and feeling "sick".  He is very in tune to his body and is able to detect when he has a bladder infection.  His last bladder infection was pansensitive E. coli back in August.  Today on urinalysis he has +2 blood, positive nitrates, +3 leukocyte esterase in his urine is cloudy.  This is complicated by the fact it came from his cystostomy bag Past Medical History:  Diagnosis Date  . Allergy   . Bacteremia due to Gram-negative bacteria 04/13/2015  . Bladder rupture 2010  . Bladder tumor   . Cancer ([place])    bladder  . Chronic kidney disease   . Depression   . Gout   . 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 Hyperlipidemia   . Restless leg syndrome, controlled    Past Surgical History:  Procedure Laterality Date  . APPENDECTOMY    . BLADDER SURGERY  2010  . COLONOSCOPY  06/05/2009  . CYSTOSCOPY W/ RETROGRADES Bilateral 04/21/2013   Procedure: CYSTOSCOPY WITH RETROGRADE PYELOGRAM;  Surgeon: Molli Hazard, MD;  Location: [facility];  Service: Urology;  Laterality: Bilateral;  . FRACTURE SURGERY Right 01/05/2005   lower leg/ankle  . NASAL SEPTUM SURGERY    . TONSILLECTOMY AND ADENOIDECTOMY    . TRANSURETHRAL RESECTION OF BLADDER TUMOR WITH GYRUS (TURBT-GYRUS) N/A 04/21/2013   Procedure: TRANSURETHRAL RESECTION OF BLADDER TUMOR WITH GYRUS (TURBT-GYRUS), cystoscopy;  Surgeon: Molli Hazard, MD;  Location: [facility];  Service: Urology;  Laterality: N/A;   Current Outpatient Medications on File Prior to Visit  Medication Sig Dispense Refill  . allopurinol (ZYLOPRIM) 300 MG tablet TAKE 1 TABLET BY MOUTH  DAILY 90 tablet 3  .  Cholecalciferol (VITAMIN D3) 1.25 MG (50000 UT) TABS     . clonazePAM (KLONOPIN) 0.5 MG tablet Take 0.5 mg by mouth 2 (two) times daily as needed.    . colchicine 0.6 MG tablet Take 0.6-1.2 mg by mouth 2 (two) times daily as needed (for gout flare).     [name] escitalopram (LEXAPRO) 20 MG tablet Take 2 tablets (40 mg total) by mouth at bedtime. 180 tablet 3  . ezetimibe (ZETIA) 10 MG tablet TAKE 1 TABLET BY MOUTH IN THE  MORNING 90 tablet 3  . fluticasone (FLONASE) 50 MCG/ACT nasal spray USE 2 SPRAYS IN BOTH  NOSTRILS DAILY 48 g 3  . gabapentin (NEURONTIN) 300 MG capsule TAKE 1 CAPSULE BY MOUTH 3  TIMES DAILY AS NEEDED 270 capsule 3  . omeprazole (PRILOSEC) 20 MG capsule TAKE 1 CAPSULE BY MOUTH  DAILY 90 capsule 3  . rOPINIRole (REQUIP) 4 MG tablet Take 4 mg by mouth at bedtime.     . rosuvastatin (CRESTOR) 5 MG tablet TAKE 1 TABLET BY MOUTH  DAILY 90 tablet 3  . vitamin B-12 (CYANOCOBALAMIN) 1000 MCG tablet Take 1,000 mcg by mouth daily.    . traZODone (DESYREL) 100 MG tablet Take 50 mg by mouth daily as needed for sleep. (Patient not taking: Reported on 04/25/2022)     No current facility-administered medications on file prior to  visit.   Allergies  Allergen Reactions  . Augmentin [Amoxicillin-Pot Clavulanate] Nausea And Vomiting and Other (See Comments)    Has patient had a PCN reaction causing immediate rash, facial/tongue/throat swelling, SOB or lightheadedness with hypotension: No Has patient had a PCN reaction causing severe rash involving mucus membranes or skin necrosis: No Has patient had a PCN reaction that required hospitalization No Has patient had a PCN reaction occurring within the last 10 years: No If all of the above answers are "NO", then may proceed with Cephalosporin use.  . Statins Other (See Comments)    Reaction:  Muscle pain   . Nystatin    Social History   Socioeconomic History  . Marital status: Married    Spouse name: Velva Harman  . Number of children: 2  . Years of  education: Not on file  . Highest education level: Not on file  Occupational History  . Not on file  Tobacco Use  . Smoking status: Former    Packs/day: 0.50    Years: 30.00    Total pack years: 15.00    Types: Cigarettes    Quit date: 07/24/1989    Years since quitting: 32.7  . Smokeless tobacco: Never  Vaping Use  . Vaping Use: Never used  Substance and Sexual Activity  . Alcohol use: Yes    Comment: social  . Drug use: No  . Sexual activity: Not on file  Other Topics Concern  . 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   1 son died in Sep 24, 2016.   1 son living.   1 granddaughter.   Social Determinants of Health   Financial Resource Strain: Low Risk  (06/21/2021)   Overall Financial Resource Strain (CARDIA)   . Difficulty of Paying Living Expenses: Not hard at all  Food Insecurity: No Food Insecurity (06/21/2021)   Hunger Vital Sign   . Worried About Charity fundraiser in the Last Year: Never true   . Ran Out of Food in the Last Year: Never true  Transportation Needs: No Transportation Needs (06/21/2021)   PRAPARE - Transportation   . Lack of Transportation (Medical): No   . Lack of Transportation (Non-Medical): No  Physical Activity: Insufficiently Active (06/21/2021)   Exercise Vital Sign   . Days of Exercise per Week: 3 days   . Minutes of Exercise per Session: 30 min  Stress: No Stress Concern Present (06/21/2021)   [place]   . Feeling of Stress : Only a little  Social Connections: Socially Integrated (06/21/2021)   Social Connection and Isolation Panel [NHANES]   . Frequency of Communication with Friends and Family: More than three times a week   . Frequency of Social Gatherings with Friends and Family: More than three times a week   . Attends Religious Services: More than 4 times per year   . Active Member of Clubs or Organizations: Yes   . Attends  Archivist Meetings: More than 4 times per year   . Marital Status: Married  Human resources officer Violence: Not At Risk (06/21/2021)   Humiliation, Afraid, Rape, and Kick questionnaire   . Fear of Current or Ex-Partner: No   . Emotionally Abused: No   . Physically Abused: No   . Sexually Abused: No   Family History  Problem Relation Age of Onset  . Cancer Mother        ovarian  .  Cirrhosis Father   . Alcohol abuse Father   . Colon cancer Neg Hx   . Colon polyps Neg Hx   . Esophageal cancer Neg Hx   . Rectal cancer Neg Hx   . Stomach cancer Neg Hx      Review of Systems  Respiratory:  Positive for cough.   Musculoskeletal:  Positive for back pain.  All other systems reviewed and are negative.      Objective:   Physical Exam Vitals reviewed.  Constitutional:      General: He is not in acute distress.    Appearance: He is well-developed. He is not diaphoretic.  HENT:     Head: Normocephalic and atraumatic.     Mouth/Throat:     Mouth: No oral lesions.  Neck:     Thyroid: No thyromegaly.     Vascular: No JVD.     Trachea: No tracheal deviation.  Cardiovascular:     Rate and Rhythm: Normal rate and regular rhythm.     Heart sounds: Normal heart sounds. No murmur heard.    No friction rub. No gallop.  Pulmonary:     Effort: Pulmonary effort is normal. No respiratory distress.     Breath sounds: No stridor. No wheezing, rhonchi or rales.  Chest:     Chest wall: No tenderness.  Skin:    General: Skin is warm.     Findings: No erythema or rash.  Neurological:     Mental Status: He is alert.     Motor: No abnormal muscle tone.     Deep Tendon Reflexes: Reflexes are normal and symmetric.  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        Assessment & Plan:  Urinary tract infection with hematuria, site unspecified - Plan: Urine Culture, Urinalysis, Routine w reflex microscopic Send urine culture.  Meanwhile start Keflex 500 mg 4 times  daily for 7 days

## 2022-04-28 LAB — URINE CULTURE
MICRO NUMBER:: 14073786
SPECIMEN QUALITY:: ADEQUATE

## 2022-05-01 ENCOUNTER — Ambulatory Visit (INDEPENDENT_AMBULATORY_CARE_PROVIDER_SITE_OTHER): Payer: Medicare Other

## 2022-05-01 DIAGNOSIS — Z23 Encounter for immunization: Secondary | ICD-10-CM | POA: Diagnosis not present

## 2022-05-27 ENCOUNTER — Other Ambulatory Visit: Payer: Self-pay | Admitting: Family Medicine

## 2022-06-14 ENCOUNTER — Other Ambulatory Visit: Payer: Medicare Other

## 2022-06-14 DIAGNOSIS — Z125 Encounter for screening for malignant neoplasm of prostate: Secondary | ICD-10-CM

## 2022-06-14 DIAGNOSIS — N183 Chronic kidney disease, stage 3 unspecified: Secondary | ICD-10-CM

## 2022-06-14 DIAGNOSIS — G609 Hereditary and idiopathic neuropathy, unspecified: Secondary | ICD-10-CM

## 2022-06-14 DIAGNOSIS — R7303 Prediabetes: Secondary | ICD-10-CM

## 2022-06-14 DIAGNOSIS — E785 Hyperlipidemia, unspecified: Secondary | ICD-10-CM

## 2022-06-14 DIAGNOSIS — N1831 Chronic kidney disease, stage 3a: Secondary | ICD-10-CM

## 2022-06-14 DIAGNOSIS — E871 Hypo-osmolality and hyponatremia: Secondary | ICD-10-CM

## 2022-06-15 LAB — HEMOGLOBIN A1C
Hgb A1c MFr Bld: 6.3 % of total Hgb — ABNORMAL HIGH (ref <5.7)
Mean Plasma Glucose: 134 mg/dL
eAG (mmol/L): 7.4 mmol/L

## 2022-06-15 LAB — CBC WITH DIFFERENTIAL/PLATELET
Absolute Monocytes: 442 cells/uL (ref 200–950)
Basophils Absolute: 39 cells/uL (ref 0–200)
Basophils Relative: 0.7 %
Eosinophils Absolute: 140 cells/uL (ref 15–500)
Eosinophils Relative: 2.5 %
HCT: 43.9 % (ref 38.5–50.0)
Hemoglobin: 14.5 g/dL (ref 13.2–17.1)
Lymphs Abs: 1277 cells/uL (ref 850–3900)
MCH: 30.5 pg (ref 27.0–33.0)
MCHC: 33 g/dL (ref 32.0–36.0)
MCV: 92.4 fL (ref 80.0–100.0)
MPV: 10.2 fL (ref 7.5–12.5)
Monocytes Relative: 7.9 %
Neutro Abs: 3702 cells/uL (ref 1500–7800)
Neutrophils Relative %: 66.1 %
Platelets: 190 10*3/uL (ref 140–400)
RBC: 4.75 10*6/uL (ref 4.20–5.80)
RDW: 13 % (ref 11.0–15.0)
Total Lymphocyte: 22.8 %
WBC: 5.6 10*3/uL (ref 3.8–10.8)

## 2022-06-15 LAB — COMPLETE METABOLIC PANEL WITH GFR
AG Ratio: 1.3 (calc) (ref 1.0–2.5)
ALT: 10 U/L (ref 9–46)
AST: 17 U/L (ref 10–35)
Albumin: 4.1 g/dL (ref 3.6–5.1)
Alkaline phosphatase (APISO): 112 U/L (ref 35–144)
BUN/Creatinine Ratio: 17 (calc) (ref 6–22)
BUN: 29 mg/dL — ABNORMAL HIGH (ref 7–25)
CO2: 23 mmol/L (ref 20–32)
Calcium: 9.1 mg/dL (ref 8.6–10.3)
Chloride: 105 mmol/L (ref 98–110)
Creat: 1.71 mg/dL — ABNORMAL HIGH (ref 0.70–1.28)
Globulin: 3.1 g/dL (calc) (ref 1.9–3.7)
Glucose, Bld: 117 mg/dL — ABNORMAL HIGH (ref 65–99)
Potassium: 4.2 mmol/L (ref 3.5–5.3)
Sodium: 140 mmol/L (ref 135–146)
Total Bilirubin: 0.3 mg/dL (ref 0.2–1.2)
Total Protein: 7.2 g/dL (ref 6.1–8.1)
eGFR: 41 mL/min/{1.73_m2} — ABNORMAL LOW (ref >=60)

## 2022-06-15 LAB — LIPID PANEL
Cholesterol: 98 mg/dL (ref <200)
HDL: 40 mg/dL (ref >=40)
LDL Cholesterol (Calc): 36 mg/dL (calc)
Non-HDL Cholesterol (Calc): 58 mg/dL (calc) (ref <130)
Total CHOL/HDL Ratio: 2.5 (calc) (ref <5.0)
Triglycerides: 138 mg/dL (ref <150)

## 2022-06-15 LAB — PSA: PSA: <0.04 ng/mL (ref <=4.00)

## 2022-06-20 ENCOUNTER — Ambulatory Visit (INDEPENDENT_AMBULATORY_CARE_PROVIDER_SITE_OTHER): Payer: Medicare Other | Admitting: Family Medicine

## 2022-06-20 ENCOUNTER — Encounter: Payer: Self-pay | Admitting: Family Medicine

## 2022-06-20 VITALS — BP 110/58 | HR 55 | Ht 69.0 in | Wt 202.0 lb

## 2022-06-20 DIAGNOSIS — R7303 Prediabetes: Secondary | ICD-10-CM | POA: Diagnosis not present

## 2022-06-20 DIAGNOSIS — E785 Hyperlipidemia, unspecified: Secondary | ICD-10-CM

## 2022-06-20 DIAGNOSIS — Z0000 Encounter for general adult medical examination without abnormal findings: Secondary | ICD-10-CM | POA: Diagnosis not present

## 2022-06-20 DIAGNOSIS — N1831 Chronic kidney disease, stage 3a: Secondary | ICD-10-CM

## 2022-06-20 NOTE — Progress Notes (Signed)
Subjective:    Patient ID: [name], male    DOB: 12-26-1945, 76 y.o.   MRN: [id number]  HPI Patient is a pleasant 76 y/o WM here today for complete physical exam.  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 His bladder has been completely removed. They also removed his prostate at the same time. They did find a small focus of prostate cancer when he did this.  His most recent PSA was undetectable.  He had a colonoscopy in 2020 that was clear.  Therefore he does not require colon cancer screening.  He is due for a COVID booster as well as RSV.  We discussed this and he declined these.  He denies any falls or memory loss or depression.  He is dealing with neuropathy.  He is working with a podiatrist who recently did a biopsy to determine if he has peripheral neuropathy.  He also has more neuromas but he has had surgery on 2 separate occasions without any improvement.  He does not feel that the gabapentin is helping. Immunization History  Administered Date(s) Administered   Fluad Quad(high Dose 65+) 04/29/2019, 04/27/2020, 05/01/2022   Influenza Split 05/25/2013   Influenza,inj,Quad PF,6+ Mos 04/22/2014, 04/28/2015, 05/09/2016, 03/07/2017, 03/03/2018   PFIZER(Purple Top)SARS-COV-2 Vaccination 09/01/2019, 09/29/2019   Pneumococcal Conjugate-13 04/22/2014   Pneumococcal Polysaccharide-23 04/28/2015   Tdap 04/26/2019   Zoster Recombinat (Shingrix) 09/25/2021, 01/04/2022   Zoster, Live 01/27/2009   Past Medical History:  Diagnosis Date   Allergy    Bacteremia due to Gram-negative bacteria 04/13/2015   Bladder rupture 2010   Bladder tumor    [facility]cer [facility])    bladder   Chronic kidney disease    Depression    Gout    History of unilateral nephrectomy 07/2014   [facility]   Hyperlipidemia    Restless leg syndrome, controlled    Past Surgical History:  Procedure Laterality Date   APPENDECTOMY     BLADDER SURGERY  2010   COLONOSCOPY  06/05/2009   CYSTOSCOPY W/ RETROGRADES Bilateral 04/21/2013    Procedure: CYSTOSCOPY WITH RETROGRADE PYELOGRAM;  Surgeon: Molli Hazard, MD;  Location: [facility];  Service: Urology;  Laterality: Bilateral;   FRACTURE SURGERY Right 01/05/2005   lower leg/ankle   NASAL SEPTUM SURGERY     TONSILLECTOMY AND ADENOIDECTOMY     TRANSURETHRAL RESECTION OF BLADDER TUMOR WITH GYRUS (TURBT-GYRUS) N/A 04/21/2013   Procedure: TRANSURETHRAL RESECTION OF BLADDER TUMOR WITH GYRUS (TURBT-GYRUS), cystoscopy;  Surgeon: Molli Hazard, MD;  Location: [facility];  Service: Urology;  Laterality: N/A;   Current Outpatient Medications on File Prior to Visit  Medication Sig Dispense Refill   allopurinol (ZYLOPRIM) 300 MG tablet TAKE 1 TABLET BY MOUTH  DAILY 90 tablet 3   Cholecalciferol (VITAMIN D3) 1.25 MG (50000 UT) TABS      clonazePAM (KLONOPIN) 0.5 MG tablet Take 0.5 mg by mouth 2 (two) times daily as needed.     colchicine 0.6 MG tablet Take 0.6-1.2 mg by mouth 2 (two) times daily as needed (for gout flare).      escitalopram (LEXAPRO) 20 MG tablet Take 2 tablets (40 mg total) by mouth at bedtime. 180 tablet 3   ezetimibe (ZETIA) 10 MG tablet TAKE 1 TABLET BY MOUTH IN THE  MORNING 90 tablet 3   fluticasone (FLONASE) 50 MCG/ACT nasal spray USE 2 SPRAYS IN BOTH  NOSTRILS DAILY 48 g 3   gabapentin (NEURONTIN) 300 MG capsule TAKE 1 CAPSULE BY MOUTH 3  TIMES DAILY AS NEEDED 270 capsule 3   omeprazole (PRILOSEC) 20 MG capsule TAKE 1 CAPSULE BY MOUTH DAILY 90 capsule 3   rOPINIRole (REQUIP) 4 MG tablet Take 4 mg by mouth at bedtime.      rosuvastatin (CRESTOR) 5 MG tablet TAKE 1 TABLET BY MOUTH  DAILY 90 tablet 3   traZODone (DESYREL) 100 MG tablet Take 50 mg by mouth daily as needed for sleep.     vitamin B-12 (CYANOCOBALAMIN) 1000 MCG tablet Take 1,000 mcg by mouth daily.     No current facility-administered medications on file prior to visit.   Allergies  Allergen Reactions   Augmentin [Amoxicillin-Pot Clavulanate] Nausea And Vomiting and Other (See Comments)     Has patient had a PCN reaction causing immediate rash, facial/tongue/throat swelling, SOB or lightheadedness with hypotension: No Has patient had a PCN reaction causing severe rash involving mucus membranes or skin necrosis: No Has patient had a PCN reaction that required hospitalization No Has patient had a PCN reaction occurring within the last 10 years: No If all of the above answers are "NO", then may proceed with Cephalosporin use.   Statins Other (See Comments)    Reaction:  Muscle pain    Nystatin    Social History   Socioeconomic History   Marital status: Married    Spouse name: Velva Harman   Number of children: 2   Years of education: Not on file   Highest education level: Not on file  Occupational History   Not on file  Tobacco Use   Smoking status: Former    Packs/day: 0.50    Years: 30.00    Total pack years: 15.00    Types: Cigarettes    Quit date: 07/24/1989    Years since quitting: 32.9   Smokeless tobacco: Never  Vaping Use   Vaping Use: Never used  Substance and Sexual Activity   Alcohol use: Yes    Comment: social   Drug use: No   Sexual activity: Not on file  Other Topics Concern   Not on file  Social History Narrative   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   1 son died in Oct 13, 2016.   1 son living.   1 granddaughter.   Social Determinants of Health   Financial Resource Strain: Low Risk  (06/21/2021)   Overall Financial Resource Strain (CARDIA)    Difficulty of Paying Living Expenses: Not hard at all  Food Insecurity: No Food Insecurity (06/21/2021)   Hunger Vital Sign    Worried About Running Out of Food in the Last Year: Never true    Ran Out of Food in the Last Year: Never true  Transportation Needs: No Transportation Needs (06/21/2021)   PRAPARE - Hydrologist (Medical): No    Lack of Transportation (Non-Medical): No  Physical Activity: Insufficiently Active (06/21/2021)   Exercise Vital Sign    Days of Exercise  per Week: 3 days    Minutes of Exercise per Session: 30 min  Stress: No Stress Concern Present (06/21/2021)   [place]    Feeling of Stress : Only a little  Social Connections: Socially Integrated (06/21/2021)   Social Connection and Isolation Panel [NHANES]    Frequency of Communication with Friends and Family: More than three times a week    Frequency of Social Gatherings with Friends and Family: More than three times a week    Attends Religious Services:  More than 4 times per year    Active Member of Clubs or Organizations: Yes    Attends Archivist Meetings: More than 4 times per year    Marital Status: Married  Human resources officer Violence: Not At Risk (06/21/2021)   Humiliation, Afraid, Rape, and Kick questionnaire    Fear of Current or Ex-Partner: No    Emotionally Abused: No    Physically Abused: No    Sexually Abused: No   Family History  Problem Relation Age of Onset   Cancer Mother        ovarian   Cirrhosis Father    Alcohol abuse Father    Colon cancer Neg Hx    Colon polyps Neg Hx    Esophageal cancer Neg Hx    Rectal cancer Neg Hx    Stomach cancer Neg Hx      Review of Systems  All other systems reviewed and are negative.      Objective:   Physical Exam Vitals reviewed.  Constitutional:      General: He is not in acute distress.    Appearance: He is well-developed. He is not diaphoretic.  HENT:     Head: Normocephalic and atraumatic.     Right Ear: External ear normal.     Left Ear: External ear normal.     Nose: Nose normal.     Mouth/Throat:     Pharynx: No oropharyngeal exudate.  Eyes:     General: No scleral icterus.       Right eye: No discharge.        Left eye: No discharge.     Conjunctiva/sclera: Conjunctivae normal.     Pupils: Pupils are equal, round, and reactive to light.  Neck:     Thyroid: No thyromegaly.     Vascular: No JVD.     Trachea: No tracheal  deviation.  Cardiovascular:     Rate and Rhythm: Normal rate and regular rhythm.     Heart sounds: Normal heart sounds. No murmur heard.    No friction rub. No gallop.  Pulmonary:     Effort: Pulmonary effort is normal. No respiratory distress.     Breath sounds: Normal breath sounds. No stridor. No wheezing or rales.  Chest:     Chest wall: No tenderness.  Abdominal:     General: Bowel sounds are normal. There is no distension.     Palpations: Abdomen is soft. There is no mass.     Tenderness: There is no abdominal tenderness. There is no guarding or rebound.  Musculoskeletal:        General: No tenderness or deformity. Normal range of motion.     Cervical back: Normal range of motion and neck supple.  Lymphadenopathy:     Cervical: No cervical adenopathy.  Skin:    General: Skin is warm.     Coloration: Skin is not pale.     Findings: No erythema or rash.  Neurological:     Mental Status: He is alert and oriented to person, place, and time.     Cranial Nerves: No cranial nerve deficit.     Motor: No abnormal muscle tone.     Coordination: Coordination normal.     Deep Tendon Reflexes: Reflexes are normal and symmetric.  Psychiatric:        Behavior: Behavior normal.        Thought Content: Thought content normal.        Judgment: Judgment normal.   Patient  has an ileal conduit ever since his radical cystectomy.        Assessment & Plan:  Encounter for Medicare annual wellness exam  Hyperlipidemia, unspecified hyperlipidemia type  Stage 3a chronic kidney disease ([place])  Prediabetes Colon cancer screening and prostate cancer surveillance are up-to-date.  Patient declines a COVID shot and RSV shot.  The remainder of his immunizations are up-to-date.  Fall and depression screens are normal.  Lab work is outstanding except for stable chronic kidney disease and prediabetes.  Recommended aerobic exercise 30 minutes a day 5 days a week.  I do not feel that the patient would  benefit from [place] due to his chronic kidney disease as he has a history of urinary tract infections once a year that have even caused hospitalizations.  I believe the increased risk of urinary tract infections for him would outweigh potential renal protective effects.  We discussed this and he agrees.  His cholesterol is outstanding and his blood pressure is excellent.  I did recommend increasing the gabapentin at night for neuropathic pain or even trying Emla cream however he declines this at the present time

## 2022-07-02 ENCOUNTER — Ambulatory Visit: Payer: Medicare Other | Attending: Podiatry

## 2022-07-02 ENCOUNTER — Other Ambulatory Visit: Payer: Self-pay

## 2022-07-02 DIAGNOSIS — R262 Difficulty in walking, not elsewhere classified: Secondary | ICD-10-CM

## 2022-07-02 DIAGNOSIS — M6281 Muscle weakness (generalized): Secondary | ICD-10-CM

## 2022-07-02 DIAGNOSIS — M79671 Pain in right foot: Secondary | ICD-10-CM | POA: Diagnosis present

## 2022-07-02 DIAGNOSIS — M79672 Pain in left foot: Secondary | ICD-10-CM | POA: Diagnosis present

## 2022-07-02 NOTE — Therapy (Signed)
OUTPATIENT PHYSICAL THERAPY LOWER EXTREMITY EVALUATION   Patient Name: [name] MRN: [id number] DOB:07-16-1945, 76 y.o., male Today's Date: 07/05/2022  END OF SESSION:  PT End of Session - 07/05/22 1242     Visit Number 1    Number of Visits 13    Date for PT Re-Evaluation 08/17/22    Authorization Type UHC MCR    Progress Note Due on Visit 10    PT Start Time 1352    PT Stop Time 1442    PT Time Calculation (min) 50 min    Activity Tolerance Patient tolerated treatment well    Behavior During Therapy [facility] for tasks assessed/performed              Past Medical History:  Diagnosis Date   Allergy    Bacteremia due to Gram-negative bacteria 04/13/2015   Bladder rupture 2010   Bladder tumor    Cancer ([place])    bladder   Chronic kidney disease    Depression    Gout    History of unilateral nephrectomy 07/2014   [facility]   Hyperlipidemia    Restless leg syndrome, controlled    Past Surgical History:  Procedure Laterality Date   APPENDECTOMY     BLADDER SURGERY  2010   COLONOSCOPY  06/05/2009   CYSTOSCOPY W/ RETROGRADES Bilateral 04/21/2013   Procedure: CYSTOSCOPY WITH RETROGRADE PYELOGRAM;  Surgeon: Molli Hazard, MD;  Location: [facility];  Service: Urology;  Laterality: Bilateral;   FRACTURE SURGERY Right 01/05/2005   lower leg/ankle   NASAL SEPTUM SURGERY     TONSILLECTOMY AND ADENOIDECTOMY     TRANSURETHRAL RESECTION OF BLADDER TUMOR WITH GYRUS (TURBT-GYRUS) N/A 04/21/2013   Procedure: TRANSURETHRAL RESECTION OF BLADDER TUMOR WITH GYRUS (TURBT-GYRUS), cystoscopy;  Surgeon: Molli Hazard, MD;  Location: [facility];  Service: Urology;  Laterality: N/A;   Patient Active Problem List   Diagnosis Date Noted   MRSA (methicillin resistant staph aureus) culture positive 12/27/2019   UTI (urinary tract infection) 08/24/2016   Fever 08/24/2016   History of nephrectomy 08/24/2016   CKD (chronic kidney disease) stage 3, GFR 30-59 ml/min ([place]) 08/24/2016   Sepsis  due to Escherichia coli (E. coli) ([place]) 04/14/2015   Bacteremia due to Gram-negative bacteria ([place]), E. Coli 04/14/2015   UTI (lower urinary tract infection) 04/13/2015   Anemia of chronic kidney failure 04/13/2015   Acute hyponatremia 04/13/2015   Bladder cancer ([place]) 06/23/2013   Hyperlipidemia    GERD (gastroesophageal reflux disease) 12/22/2012    PCP: [name], MD   REFERRING PROVIDER: Jobe Igo, DPM   REFERRING DIAG: [phone] (ICD-10-CM) - Lesion of plantar nerve, left lower limb   THERAPY DIAG:  Pain in left foot  Pain in right foot  Difficulty in walking, not elsewhere classified  Muscle weakness (generalized)  Rationale for Evaluation and Treatment: Rehabilitation  ONSET DATE: chronic   SUBJECTIVE:   SUBJECTIVE STATEMENT: He reports onset of bilateral foot pain in 2020 that progressively worsened.Patient felt like he had neuropathy, so went to [facility] podiatry and had surgery for neuroma excision on 05/07/21. He continued to have pain after this surgery about the forefoot. He had a stump neuroma revision in August 2023, but this did not alleviate his pain. He saw a new podiatrist and received an injection recently, but this has not helped with his pain. He feels there are lumps in between the 1st-2nd webspace bilaterally. He has pain when he puts pressure on the bottom of the  foot and has pain with light touch to the 2nd digit. He reports after about 30 minutes of walking he needs to rest due to pain.   PERTINENT HISTORY: Bladder cancer Chronic kidney disease History of unilateral nephrectomy  Fracture surgery of Rt ankle 2006 Morton's neuroma excision bilateral Stump neuroma revision surgery bilateral  Recent ultrasound guided injection to bilateral feet  PAIN:  Are you having pain? Yes: NPRS scale: 7/10 Pain location: bilateral dorsal aspect of forefoot (2nd digit) Pain description: raw Aggravating factors: rubbing the toe; pressure on the ball of  the foot Relieving factors: rest  PRECAUTIONS: None  WEIGHT BEARING RESTRICTIONS: No  FALLS:  Has patient fallen in last 6 months? No  LIVING ENVIRONMENT: Lives with: lives with their family Lives in: House/apartment Stairs: Yes: External: 3 steps; on left going up Has following equipment at home: Single point cane and Walker - 4 wheeled  OCCUPATION: retired   PLOF: Independent  PATIENT GOALS: "I want to get the pain gone."   NEXT MD VISIT: January 2024  OBJECTIVE:   DIAGNOSTIC FINDINGS:  No recent imaging on file for Lt foot   Rt foot MRI IMPRESSION: 1. 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 2. Mild arthritic changes of the medial hallux sesamoid-metatarsal articulation. 3. A 4.4 mm soft tissue mass along the plantar aspect between the second and third metatarsal heads concerning for a small plantar neuroma.  PATIENT SURVEYS:  FOTO 56% function to 66% predicted   COGNITION: Overall cognitive status: Within functional limits for tasks assessed     SENSATION: Light touch: hypersensitivity 2nd digit bilaterally (dorsal aspect)  EDEMA:  None present    POSTURE: No Significant postural limitations  PALPATION: Hypersensitive to light palpation to dorsal aspect of 2nd digit bilaterally  LOWER EXTREMITY ROM:  Active ROM Right eval Left eval  Hip flexion    Hip extension    Hip abduction    Hip adduction    Hip internal rotation    Hip external rotation    Knee flexion    Great toe passive extension  30 30  Ankle dorsiflexion 10 6  Ankle plantarflexion WNL WNL  Ankle inversion WNL WNL  Ankle eversion WNL WNL   (Blank rows = not tested)  LOWER EXTREMITY MMT:  MMT Right eval Left eval  Hip flexion    Hip extension    Hip abduction    Hip adduction    Hip internal rotation    Hip external rotation    Knee flexion    Knee extension    Ankle dorsiflexion 5  5  Ankle plantarflexion Can complete SL calf raise Can complete SL calf raise   Ankle inversion 5 5  Ankle eversion 5 5   (Blank rows = not tested)  Digit flexion: RLE 5/5 LLE 3-/5 Able to abduct Rt digits, unable to abduct Lt digits   LOWER EXTREMITY SPECIAL TESTS:  N/A  FUNCTIONAL TESTS:  SLS LLE 5 seconds; RLE 30 seconds   GAIT: Distance walked: 20 ft Assistive device utilized: None Level of assistance: Complete Independence Comments: limited push-off, excessive pronation    [facility] Adult PT Treatment:                                                DATE: 07/02/22 Therapeutic Exercise: Demonstrated  and issue initial HEP.   Therapeutic Activity: Education on assessment findings that will be addressed throughout duration of POC.      PATIENT EDUCATION:  Education details: see treatment  Person educated: Patient Education method: Explanation, Demonstration, Tactile cues, Verbal cues, and Handouts Education comprehension: verbalized understanding, returned demonstration, verbal cues required, tactile cues required, and needs further education  HOME EXERCISE PROGRAM: Access Code: [email or web address] URL: [email or web address] Date: 07/02/2022 Prepared by: Gwendolyn Grant  Exercises - Towel Desensitization  - 3 x daily - 7 x weekly - Seated Self Great Toe Stretch  - 2 x daily - 7 x weekly - 3 sets - 30 sec  hold - Long Sitting Calf Stretch with Strap  - 2 x daily - 7 x weekly - 3 sets - 30 sec  hold - Seated Great Toe Extension  - 2 x daily - 7 x weekly - 2 sets - 10 reps  ASSESSMENT:  CLINICAL IMPRESSION: Patient is a 76 y.o. male who was seen today for physical therapy evaluation and treatment for bilateral foot pain. Patient has undergone Morton's neuroma excision in 2022 and stump neuroma revision in 2023. He reports a feeling of a lump in between the 1st-2nd webspace bilaterally that is notable when he puts pressure on the ball of his foot as well as extreme  hypersensitivity to light touch to the 2nd digit bilaterally. Upon assessment he is noted to have limited Lt ankle DF AROM, limited bilateral passive great toe extension ROM, balance deficits, gait impairments, and intrinsic foot weakness. He will benefit from skilled PT to address the above stated deficits in order to optimize his function and assist in overall pain reduction.   OBJECTIVE IMPAIRMENTS: Abnormal gait, decreased activity tolerance, decreased balance, decreased knowledge of condition, difficulty walking, decreased ROM, decreased strength, impaired sensation, and pain.   ACTIVITY LIMITATIONS: standing and locomotion level  PARTICIPATION LIMITATIONS: shopping, community activity, and golf  PERSONAL FACTORS: Age, Time since onset of injury/illness/exacerbation, and 3+ comorbidities: see past medical/past surgical history above  are also affecting patient's functional outcome.   REHAB POTENTIAL: Good  CLINICAL DECISION MAKING: Evolving/moderate complexity  EVALUATION COMPLEXITY: Moderate   GOALS: Goals reviewed with patient? Yes  SHORT TERM GOALS: Target date: 07/26/2022   Patient will be independent and compliant with initial HEP.   Baseline: issued at eval  Goal status: INITIAL  2.  Patient will be able to tolerate desensitization using a towel for at least 5 minutes to reduce his sensitivity to his bed sheets when sleeping.  Baseline: does not tolerate light touch to 2nd digits; reports extreme pain when his sheets rub on his 2nd digit  Goal status: INITIAL  3.  Patient will demonstrate at least 10 degrees of Lt ankle DF AROM to improve gait mechanics.  Baseline: see above  Goal status: INITIAL   LONG TERM GOALS: Target date: 08/16/2022    Patient will demonstrate at least 40 degrees of bilateral great toe extension PROM to improve push-off.  Baseline: see above  Goal status: INITIAL  2.  Patient will maintain LLE SLS for at least 30 seconds to improve his  stability when walking on uneven surfaces.  Baseline: see above  Goal status: INITIAL  3.  Patient will score at least 66% on FOTO to signify clinically meaningful improvement in functional abilities.   Baseline: see above  Goal status: INITIAL  4.  Patient will report pain at worst rated as 3/10 to reduce his current functional limitations.  Baseline: 7/10  Goal status: INITIAL    PLAN:  PT FREQUENCY: 1-2x/week  PT DURATION: 6 weeks  PLANNED INTERVENTIONS: Therapeutic exercises, Therapeutic activity, Neuromuscular re-education, Balance training, Gait training, Patient/Family education, Self Care, Joint mobilization, Aquatic Therapy, Dry Needling, Cryotherapy, Moist heat, Manual therapy, and Re-evaluation  PLAN FOR NEXT SESSION: review and progress HEP; desensitization, manual to bilateral feet; foot intrinsic strengthening.    Gwendolyn Grant, PT, DPT, ATC 07/05/22 12:43 PM

## 2022-07-02 NOTE — Patient Instructions (Signed)
Aquatic Therapy at [facility]-  What to Expect!  Where:   [facility] [address] [place], [place] [address] Rehab phone [phone]  NOTE:  You will receive an automated phone message reminding you of your appt and it will say the appointment is at the [address] [facility] [address] [facility] clinic.          How to Prepare: Please make sure you drink 8 ounces of water about one hour prior to your pool session A caregiver may attend if needed with the patient to help assist as needed. A caregiver can sit in the pool room on chair. Please arrive IN YOUR SUIT and 15 minutes prior to your appointment - this helps to avoid delays in starting your session. Please make sure to attend to any toileting needs prior to entering the pool Locker rooms for changing are provided.   There is direct access to the pool deck form the locker room.  You can lock your belongings in a locker with lock provided. Once on the pool deck your therapist will ask if you have signed the Patient  Consent and Assignment of Benefits form before beginning treatment Your therapist may take your blood pressure prior to, during and after your session if indicated We usually try and create a home exercise program based on activities we do in the pool.  Please be thinking about who might be able to assist you in the pool should you need to participate in an aquatic home exercise program at the time of discharge if you need assistance.  Some patients do not want to or do not have the ability to participate in an aquatic home program - this is not a barrier in any way to you participating in aquatic therapy as part of your current therapy plan! After Discharge from PT, you can continue using home program at  the [facility]/, there is a drop-in fee for $5 ($45 a month)or for 60 years  or older $4.00 ($40 a month for seniors ) or any local [employer] pool.  Memberships for purchase are  available for gym/pool at [facility]  IT IS VERY IMPORTANT THAT YOUR LAST VISIT BE IN THE CLINIC AT [address] AFTER YOUR LAST AQUATIC VISIT.  PLEASE MAKE SURE THAT YOU HAVE A [address]  APPOINTMENT SCHEDULED.   About the pool: Pool is located approximately 500 FT from the entrance of the building.  Please bring a support person if you need assistance traveling this      distance.   Your therapist will assist you in entering the water; there are two ways to           enter: stairs with railings, and a mechanical lift. Your therapist will determine the most appropriate way for you.  Water temperature is usually between 88-90 degrees  There may be up to 2 other swimmers in the pool at the same time  The pool deck is tile, please wear shoes with good traction if you prefer not to be barefoot.    Contact Info:  For appointment scheduling and cancellations:         Please call the [facility]  PH:[phone]              Aquatic Therapy  Outpatient [facility]       All sessions are 45 minutes                                                    

## 2022-07-15 ENCOUNTER — Ambulatory Visit: Payer: Medicare Other | Attending: Podiatry

## 2022-07-15 DIAGNOSIS — R262 Difficulty in walking, not elsewhere classified: Secondary | ICD-10-CM | POA: Diagnosis present

## 2022-07-15 DIAGNOSIS — M79671 Pain in right foot: Secondary | ICD-10-CM | POA: Diagnosis present

## 2022-07-15 DIAGNOSIS — M79672 Pain in left foot: Secondary | ICD-10-CM | POA: Diagnosis present

## 2022-07-15 DIAGNOSIS — M6281 Muscle weakness (generalized): Secondary | ICD-10-CM | POA: Insufficient documentation

## 2022-07-15 NOTE — Therapy (Signed)
OUTPATIENT PHYSICAL THERAPY TREATMENT NOTE   Patient Name: [name] MRN: [id number] DOB:1945/08/15, 77 y.o., male Today's Date: 07/15/2022  PCP: [name], MD REFERRING PROVIDER: Jobe Igo, DPM    END OF SESSION:   PT End of Session - 07/15/22 1458     Visit Number 2    Number of Visits 13    Date for PT Re-Evaluation 08/17/22    Authorization Type UHC MCR    Progress Note Due on Visit 10    PT Start Time 1500    PT Stop Time 1545    PT Time Calculation (min) 45 min    Activity Tolerance Patient tolerated treatment well    Behavior During Therapy [facility] for tasks assessed/performed             Past Medical History:  Diagnosis Date   Allergy    Bacteremia due to Gram-negative bacteria 04/13/2015   Bladder rupture 2010   Bladder tumor    Cancer ([place])    bladder   Chronic kidney disease    Depression    Gout    History of unilateral nephrectomy 07/2014   [facility]   Hyperlipidemia    Restless leg syndrome, controlled    Past Surgical History:  Procedure Laterality Date   APPENDECTOMY     BLADDER SURGERY  2010   COLONOSCOPY  06/05/2009   CYSTOSCOPY W/ RETROGRADES Bilateral 04/21/2013   Procedure: CYSTOSCOPY WITH RETROGRADE PYELOGRAM;  Surgeon: Molli Hazard, MD;  Location: [facility];  Service: Urology;  Laterality: Bilateral;   FRACTURE SURGERY Right 01/05/2005   lower leg/ankle   NASAL SEPTUM SURGERY     TONSILLECTOMY AND ADENOIDECTOMY     TRANSURETHRAL RESECTION OF BLADDER TUMOR WITH GYRUS (TURBT-GYRUS) N/A 04/21/2013   Procedure: TRANSURETHRAL RESECTION OF BLADDER TUMOR WITH GYRUS (TURBT-GYRUS), cystoscopy;  Surgeon: Molli Hazard, MD;  Location[facility];  Service: Urology;  Laterality: N/A;   Patient Active Problem List   Diagnosis Date Noted   MRSA (methicillin resistant staph aureus) culture positive 12/27/2019   UTI (urinary tract infection) 08/24/2016   Fever 08/24/2016   History of nephrectomy 08/24/2016   CKD (chronic kidney  disease) stage 3, GFR 30-59 ml/min ([place]) 08/24/2016   Sepsis due to Escherichia coli (E. coli) ([place]) 04/14/2015   Bacteremia due to Gram-negative bacteria ([place]), E. Coli 04/14/2015   UTI (lower urinary tract infection) 04/13/2015   Anemia of chronic kidney failure 04/13/2015   Acute hyponatremia 04/13/2015   Bladder cancer ([place]) 06/23/2013   Hyperlipidemia    GERD (gastroesophageal reflux disease) 12/22/2012    REFERRING DIAG: Q75.91 (ICD-10-CM) - Lesion of plantar nerve, left lower limb    THERAPY DIAG:  Pain in left foot  Pain in right foot  Difficulty in walking, not elsewhere classified  Muscle weakness (generalized)  Rationale for Evaluation and Treatment Rehabilitation  PERTINENT HISTORY:  Bladder cancer Chronic kidney disease History of unilateral nephrectomy  Fracture surgery of Rt ankle 2006 Morton's neuroma excision bilateral Stump neuroma revision surgery bilateral  Recent ultrasound guided injection to bilateral feet   PRECAUTIONS: none   SUBJECTIVE:  SUBJECTIVE STATEMENT:  "It's no better." No pain currently, but still hurts with pressure and touching the 2nd toe.    PAIN:  Are you having pain?No   OBJECTIVE: (objective measures completed at initial evaluation unless otherwise dated)  DIAGNOSTIC FINDINGS:  No recent imaging on file for Lt foot    Rt foot MRI IMPRESSION: 1. 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 2. Mild arthritic changes of the medial hallux sesamoid-metatarsal articulation. 3. A 4.4 mm soft tissue mass along the plantar aspect between the second and third metatarsal heads concerning for a small plantar neuroma.   PATIENT SURVEYS:  FOTO 56% function to 66% predicted     COGNITION: Overall cognitive status: Within functional limits for tasks assessed                         SENSATION: Light touch: hypersensitivity 2nd digit bilaterally (dorsal aspect)   EDEMA:  None present      POSTURE: No Significant postural limitations   PALPATION: Hypersensitive to light palpation to dorsal aspect of 2nd digit bilaterally   LOWER EXTREMITY ROM:   Active ROM Right eval Left eval 07/15/22 Left   Hip flexion       Hip extension       Hip abduction       Hip adduction       Hip internal rotation       Hip external rotation       Knee flexion       Great toe passive extension  30 30   Ankle dorsiflexion '10 6 6  '$ Ankle plantarflexion WNL WNL   Ankle inversion WNL WNL   Ankle eversion WNL WNL    (Blank rows = not tested)   LOWER EXTREMITY MMT:   MMT Right eval Left eval  Hip flexion      Hip extension      Hip abduction      Hip adduction      Hip internal rotation      Hip external rotation      Knee flexion      Knee extension      Ankle dorsiflexion 5 5  Ankle plantarflexion Can complete SL calf raise Can complete SL calf raise   Ankle inversion 5 5  Ankle eversion 5 5   (Blank rows = not tested)   Digit flexion: RLE 5/5 LLE 3-/5 Able to abduct Rt digits, unable to abduct Lt digits    LOWER EXTREMITY SPECIAL TESTS:  N/A   FUNCTIONAL TESTS:  SLS LLE 5 seconds; RLE 30 seconds    GAIT: Distance walked: 20 ft Assistive device utilized: None Level of assistance: Complete Independence Comments: limited push-off, excessive pronation    [facility] Adult PT Treatment:                                                DATE: 07/15/22 Therapeutic Exercise: Toe curls x 10 each Toe yoga x 10 each  Seated marble pickups attempted unable Putty toe squeezes x 10 each Updated HEP Manual Therapy: Desensitization 2nd digit with pillow case Passive toe flexion and extension ROM MT glides Great toe and 2nd toe A/P mobilizations grade II-III   [facility]  Adult PT Treatment:  DATE: 07/02/22 Therapeutic Exercise: Demonstrated and issue initial HEP.    Therapeutic Activity: Education on assessment findings that will be addressed throughout duration of POC.          PATIENT EDUCATION:  Education details: see treatment  Person educated: Patient Education method: Explanation, Demonstration, Tactile cues, Verbal cues, and Handouts Education comprehension: verbalized understanding, returned demonstration, verbal cues required, tactile cues required, and needs further education   HOME EXERCISE PROGRAM: Access Code: [email or web address] URL: [email or web address] Date: 07/02/2022 Prepared by: Gwendolyn Grant   Exercises - Towel Desensitization  - 3 x daily - 7 x weekly - Seated Self Great Toe Stretch  - 2 x daily - 7 x weekly - 3 sets - 30 sec  hold - Long Sitting Calf Stretch with Strap  - 2 x daily - 7 x weekly - 3 sets - 30 sec  hold - Seated Great Toe Extension  - 2 x daily - 7 x weekly - 2 sets - 10 reps   ASSESSMENT:   CLINICAL IMPRESSION: Patient has improved tolerance to desensitization techniques to the Rt 2nd digit, but ongoing hypersensitivity on the Lt 2nd digit. He has difficulty isolating intrinsic foot musculature on the LLE with strengthening today. Attempted marble pickups, but patient lacks the ROM and strength at this time to perform this exercise.    OBJECTIVE IMPAIRMENTS: Abnormal gait, decreased activity tolerance, decreased balance, decreased knowledge of condition, difficulty walking, decreased ROM, decreased strength, impaired sensation, and pain.    ACTIVITY LIMITATIONS: standing and locomotion level   PARTICIPATION LIMITATIONS: shopping, community activity, and golf   PERSONAL FACTORS: Age, Time since onset of injury/illness/exacerbation, and 3+ comorbidities: see past medical/past surgical history above  are also affecting patient's functional outcome.    REHAB  POTENTIAL: Good   CLINICAL DECISION MAKING: Evolving/moderate complexity   EVALUATION COMPLEXITY: Moderate     GOALS: Goals reviewed with patient? Yes   SHORT TERM GOALS: Target date: 07/26/2022     Patient will be independent and compliant with initial HEP.    Baseline: issued at eval  Goal status: INITIAL   2.  Patient will be able to tolerate desensitization using a towel for at least 5 minutes to reduce his sensitivity to his bed sheets when sleeping.  Baseline: does not tolerate light touch to 2nd digits; reports extreme pain when his sheets rub on his 2nd digit  Goal status: INITIAL   3.  Patient will demonstrate at least 10 degrees of Lt ankle DF AROM to improve gait mechanics.  Baseline: see above  Goal status: INITIAL     LONG TERM GOALS: Target date: 08/16/2022       Patient will demonstrate at least 40 degrees of bilateral great toe extension PROM to improve push-off.  Baseline: see above  Goal status: INITIAL   2.  Patient will maintain LLE SLS for at least 30 seconds to improve his stability when walking on uneven surfaces.  Baseline: see above  Goal status: INITIAL   3.  Patient will score at least 66% on FOTO to signify clinically meaningful improvement in functional abilities.    Baseline: see above  Goal status: INITIAL   4.  Patient will report pain at worst rated as 3/10 to reduce his current functional limitations.  Baseline: 7/10  Goal status: INITIAL       PLAN:   PT FREQUENCY: 1-2x/week   PT DURATION: 6 weeks   PLANNED INTERVENTIONS: Therapeutic exercises, Therapeutic activity, Neuromuscular re-education, Balance training, Gait training, Patient/Family  education, Self Care, Joint mobilization, Aquatic Therapy, Dry Needling, Cryotherapy, Moist heat, Manual therapy, and Re-evaluation   PLAN FOR NEXT SESSION: review and progress HEP; desensitization, manual to bilateral feet; foot intrinsic strengthening.      Gwendolyn Grant, PT, DPT,  ATC 07/15/22 3:56 PM

## 2022-07-22 ENCOUNTER — Ambulatory Visit: Payer: Medicare Other

## 2022-07-22 DIAGNOSIS — R262 Difficulty in walking, not elsewhere classified: Secondary | ICD-10-CM

## 2022-07-22 DIAGNOSIS — M79672 Pain in left foot: Secondary | ICD-10-CM | POA: Diagnosis not present

## 2022-07-22 DIAGNOSIS — M79671 Pain in right foot: Secondary | ICD-10-CM

## 2022-07-22 DIAGNOSIS — M6281 Muscle weakness (generalized): Secondary | ICD-10-CM

## 2022-07-22 NOTE — Therapy (Signed)
OUTPATIENT PHYSICAL THERAPY TREATMENT NOTE   Patient Name: [name] MRN: [id number] DOB:[birth year], 77 y.o., male Today's Date: 07/22/2022  PCP: [name], MD REFERRING PROVIDER: Jobe Igo, DPM    END OF SESSION:   PT End of Session - 07/22/22 1406     Visit Number 3    Number of Visits 13    Date for PT Re-Evaluation 08/17/22    Authorization Type UHC MCR    Progress Note Due on Visit 10    PT Start Time 6803    PT Stop Time 1450    PT Time Calculation (min) 45 min    Activity Tolerance Patient tolerated treatment well    Behavior During Therapy [facility] for tasks assessed/performed              Past Medical History:  Diagnosis Date   Allergy    Bacteremia due to Gram-negative bacteria 04/13/2015   Bladder rupture 2010   Bladder tumor    Cancer ([place])    bladder   Chronic kidney disease    Depression    Gout    History of unilateral nephrectomy 07/2014   [facility]   Hyperlipidemia    Restless leg syndrome, controlled    Past Surgical History:  Procedure Laterality Date   APPENDECTOMY     BLADDER SURGERY  2010   COLONOSCOPY  06/05/2009   CYSTOSCOPY W/ RETROGRADES Bilateral 04/21/2013   Procedure: CYSTOSCOPY WITH RETROGRADE PYELOGRAM;  Surgeon: Molli Hazard, MD;  Location: [facility];  Service: Urology;  Laterality: Bilateral;   FRACTURE SURGERY Right 01/05/2005   lower leg/ankle   NASAL SEPTUM SURGERY     TONSILLECTOMY AND ADENOIDECTOMY     TRANSURETHRAL RESECTION OF BLADDER TUMOR WITH GYRUS (TURBT-GYRUS) N/A 04/21/2013   Procedure: TRANSURETHRAL RESECTION OF BLADDER TUMOR WITH GYRUS (TURBT-GYRUS), cystoscopy;  Surgeon: Molli Hazard, MD;  Location: [facility];  Service: Urology;  Laterality: N/A;   Patient Active Problem List   Diagnosis Date Noted   MRSA (methicillin resistant staph aureus) culture positive 12/27/2019   UTI (urinary tract infection) 08/24/2016   Fever 08/24/2016   History of nephrectomy 08/24/2016   CKD (chronic  kidney disease) stage 3, GFR 30-59 ml/min ([place]) 08/24/2016   Sepsis due to Escherichia coli (E. coli) ([place]) 04/14/2015   Bacteremia due to Gram-negative bacteria ([place]), E. Coli 04/14/2015   UTI (lower urinary tract infection) 04/13/2015   Anemia of chronic kidney failure 04/13/2015   Acute hyponatremia 04/13/2015   Bladder cancer ([place]) 06/23/2013   Hyperlipidemia    GERD (gastroesophageal reflux disease) 12/22/2012    REFERRING DIAG: O12.24 (ICD-10-CM) - Lesion of plantar nerve, left lower limb    THERAPY DIAG:  Pain in left foot  Pain in right foot  Difficulty in walking, not elsewhere classified  Muscle weakness (generalized)  Rationale for Evaluation and Treatment Rehabilitation  PERTINENT HISTORY:  Bladder cancer Chronic kidney disease History of unilateral nephrectomy  Fracture surgery of Rt ankle 2006 Morton's neuroma excision bilateral Stump neuroma revision surgery bilateral  Recent ultrasound guided injection to bilateral feet   PRECAUTIONS: none   SUBJECTIVE:  SUBJECTIVE STATEMENT:  "At night they get pretty bad."   PAIN:  Are you having pain?No   OBJECTIVE: (objective measures completed at initial evaluation unless otherwise dated)  DIAGNOSTIC FINDINGS:  No recent imaging on file for Lt foot    Rt foot MRI IMPRESSION: 1. 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 2. Mild arthritic changes of the medial hallux sesamoid-metatarsal articulation. 3. A 4.4 mm soft tissue mass along the plantar aspect between the second and third metatarsal heads concerning for a small plantar neuroma.   PATIENT SURVEYS:  FOTO 56% function to 66% predicted    COGNITION: Overall cognitive status: Within  functional limits for tasks assessed                         SENSATION: Light touch: hypersensitivity 2nd digit bilaterally (dorsal aspect)   EDEMA:  None present      POSTURE: No Significant postural limitations   PALPATION: Hypersensitive to light palpation to dorsal aspect of 2nd digit bilaterally   LOWER EXTREMITY ROM:   Active ROM Right eval Left eval 07/15/22 Left  07/22/22 Left   Hip flexion        Hip extension        Hip abduction        Hip adduction        Hip internal rotation        Hip external rotation        Knee flexion        Great toe passive extension  30 30    Ankle dorsiflexion '10 6 6 10  '$ Ankle plantarflexion WNL WNL    Ankle inversion WNL WNL    Ankle eversion WNL WNL     (Blank rows = not tested)   LOWER EXTREMITY MMT:   MMT Right eval Left eval  Hip flexion      Hip extension      Hip abduction      Hip adduction      Hip internal rotation      Hip external rotation      Knee flexion      Knee extension      Ankle dorsiflexion 5 5  Ankle plantarflexion Can complete SL calf raise Can complete SL calf raise   Ankle inversion 5 5  Ankle eversion 5 5   (Blank rows = not tested)   Digit flexion: RLE 5/5 LLE 3-/5 Able to abduct Rt digits, unable to abduct Lt digits    LOWER EXTREMITY SPECIAL TESTS:  N/A   FUNCTIONAL TESTS:  SLS LLE 5 seconds; RLE 30 seconds    GAIT: Distance walked: 20 ft Assistive device utilized: None Level of assistance: Complete Independence Comments: limited push-off, excessive pronation   [facility] Adult PT Treatment:                                                DATE: 07/22/22 Therapeutic Exercise: Rockerboard A/P 2 x 10  SL calf raise 2 x 10  Updated HEP  Manual Therapy: Desensitization 2nd digit Passive toe flexion and extension ROM MT glides Great toe and 2nd toe A/P mobilizations grade II-III DTM bilateral plantar fascia  Demo and issued tennis ball for self-soft tissue mobilization   Neuromuscular  Re-ed Tandem on airex  unable SL on airex unable Romberg on airex eyes closed 5 x 10 sec Tandem walks fwd 3 x 15 ft, backward x 15 ft    [facility] Adult PT Treatment:                                                DATE: 07/15/22 Therapeutic Exercise: Toe curls x 10 each Toe yoga x 10 each  Seated marble pickups attempted unable Putty toe squeezes x 10 each Updated HEP Manual Therapy: Desensitization 2nd digit with pillow case Passive toe flexion and extension ROM MT glides Great toe and 2nd toe A/P mobilizations grade II-III   [facility] Adult PT Treatment:                                                DATE: 07/02/22 Therapeutic Exercise: Demonstrated and issue initial HEP.    Therapeutic Activity: Education on assessment findings that will be addressed throughout duration of POC.          PATIENT EDUCATION:  Education details: see treatment  Person educated: Patient Education method: Explanation, Demonstration, Tactile cues, Verbal cues, and Handouts Education comprehension: verbalized understanding, returned demonstration, verbal cues required, tactile cues required, and needs further education   HOME EXERCISE PROGRAM: Access Code: [email or web address] URL: [email or web address] Date: 07/02/2022 Prepared by: Gwendolyn Grant   Exercises - Towel Desensitization  - 3 x daily - 7 x weekly - Seated Self Great Toe Stretch  - 2 x daily - 7 x weekly - 3 sets - 30 sec  hold - Long Sitting Calf Stretch with Strap  - 2 x daily - 7 x weekly - 3 sets - 30 sec  hold - Seated Great Toe Extension  - 2 x daily - 7 x weekly - 2 sets - 10 reps   ASSESSMENT:   CLINICAL IMPRESSION: Continued with manual therapy techniques to improve foot/toe mobility and desensitization to 2nd digit. He is noted to have tautness and palpable tenderness about bilateral plantar fascia, so tennis ball was issued for patient to complete self-soft tissue mobilization as part of HEP. Introduced balance activity on  unstable surface with patient unable to maintain tandem or SLS due to LOB, but maintains romberg with eyes closed for brief duration. With tandem walking he is noted to have poor stability when LLE is stance leg. HEP update to include further strengthening and balance activity.    OBJECTIVE IMPAIRMENTS: Abnormal gait, decreased activity tolerance, decreased balance, decreased knowledge of condition, difficulty walking, decreased ROM, decreased strength, impaired sensation, and pain.    ACTIVITY LIMITATIONS: standing and locomotion level   PARTICIPATION LIMITATIONS: shopping, community activity, and golf   PERSONAL FACTORS: Age, Time since onset of injury/illness/exacerbation, and 3+ comorbidities: see past medical/past surgical history above  are also affecting patient's functional outcome.    REHAB POTENTIAL: Good   CLINICAL DECISION MAKING: Evolving/moderate complexity   EVALUATION COMPLEXITY: Moderate     GOALS: Goals reviewed with patient? Yes   SHORT TERM GOALS: Target date: 07/26/2022     Patient will be independent and compliant with initial HEP.    Baseline: issued at eval  Goal status: met   2.  Patient will be able to tolerate desensitization using a towel  for at least 5 minutes to reduce his sensitivity to his bed sheets when sleeping.  Baseline: does not tolerate light touch to 2nd digits; reports extreme pain when his sheets rub on his 2nd digit  Goal status: INITIAL   3.  Patient will demonstrate at least 10 degrees of Lt ankle DF AROM to improve gait mechanics.  Baseline: see above  Goal status: met     LONG TERM GOALS: Target date: 08/16/2022       Patient will demonstrate at least 40 degrees of bilateral great toe extension PROM to improve push-off.  Baseline: see above  Goal status: INITIAL   2.  Patient will maintain LLE SLS for at least 30 seconds to improve his stability when walking on uneven surfaces.  Baseline: see above  Goal status: INITIAL   3.   Patient will score at least 66% on FOTO to signify clinically meaningful improvement in functional abilities.    Baseline: see above  Goal status: INITIAL   4.  Patient will report pain at worst rated as 3/10 to reduce his current functional limitations.  Baseline: 7/10  Goal status: INITIAL       PLAN:   PT FREQUENCY: 1-2x/week   PT DURATION: 6 weeks   PLANNED INTERVENTIONS: Therapeutic exercises, Therapeutic activity, Neuromuscular re-education, Balance training, Gait training, Patient/Family education, Self Care, Joint mobilization, Aquatic Therapy, Dry Needling, Cryotherapy, Moist heat, Manual therapy, and Re-evaluation   PLAN FOR NEXT SESSION: review and progress HEP; desensitization, manual to bilateral feet; foot intrinsic strengthening. balance and ankle strengthening      Gwendolyn Grant, PT, DPT, ATC 07/22/22 2:54 PM

## 2022-07-23 NOTE — Therapy (Signed)
OUTPATIENT PHYSICAL THERAPY TREATMENT NOTE   Patient Name: [name] MRN: [id number] DOB:1945/10/01, 77 y.o., male Today's Date: 07/24/2022  PCP: [name], MD REFERRING PROVIDER: Jobe Igo, DPM    END OF SESSION:   PT End of Session - 07/24/22 1330     Visit Number 4    Number of Visits 13    Date for PT Re-Evaluation 08/17/22    Authorization Type UHC MCR    Progress Note Due on Visit 10    PT Start Time 1330    PT Stop Time 1413    PT Time Calculation (min) 43 min    Activity Tolerance Patient tolerated treatment well    Behavior During Therapy [facility] for tasks assessed/performed               Past Medical History:  Diagnosis Date   Allergy    Bacteremia due to Gram-negative bacteria 04/13/2015   Bladder rupture 2010   Bladder tumor    Cancer ([place])    bladder   Chronic kidney disease    Depression    Gout    History of unilateral nephrectomy 07/2014   [facility]   Hyperlipidemia    Restless leg syndrome, controlled    Past Surgical History:  Procedure Laterality Date   APPENDECTOMY     BLADDER SURGERY  2010   COLONOSCOPY  06/05/2009   CYSTOSCOPY W/ RETROGRADES Bilateral 04/21/2013   Procedure: CYSTOSCOPY WITH RETROGRADE PYELOGRAM;  Surgeon: Molli Hazard, MD;  Location: [facility];  Service: Urology;  Laterality: Bilateral;   FRACTURE SURGERY Right 01/05/2005   lower leg/ankle   NASAL SEPTUM SURGERY     TONSILLECTOMY AND ADENOIDECTOMY     TRANSURETHRAL RESECTION OF BLADDER TUMOR WITH GYRUS (TURBT-GYRUS) N/A 04/21/2013   Procedure: TRANSURETHRAL RESECTION OF BLADDER TUMOR WITH GYRUS (TURBT-GYRUS), cystoscopy;  Surgeon: Molli Hazard, MD;  Location: [facility];  Service: Urology;  Laterality: N/A;   Patient Active Problem List   Diagnosis Date Noted   MRSA (methicillin resistant staph aureus) culture positive 12/27/2019   UTI (urinary tract infection) 08/24/2016   Fever 08/24/2016   History of nephrectomy 08/24/2016   CKD (chronic  kidney disease) stage 3, GFR 30-59 ml/min ([place]) 08/24/2016   Sepsis due to Escherichia coli (E. coli) ([place]) 04/14/2015   Bacteremia due to Gram-negative bacteria ([place]), E. Coli 04/14/2015   UTI (lower urinary tract infection) 04/13/2015   Anemia of chronic kidney failure 04/13/2015   Acute hyponatremia 04/13/2015   Bladder cancer ([place]) 06/23/2013   Hyperlipidemia    GERD (gastroesophageal reflux disease) 12/22/2012    REFERRING DIAG: F74.94 (ICD-10-CM) - Lesion of plantar nerve, left lower limb    THERAPY DIAG:  Pain in left foot  Pain in right foot  Difficulty in walking, not elsewhere classified  Muscle weakness (generalized)  Rationale for Evaluation and Treatment Rehabilitation  PERTINENT HISTORY:  Bladder cancer Chronic kidney disease History of unilateral nephrectomy  Fracture surgery of Rt ankle 2006 Morton's neuroma excision bilateral Stump neuroma revision surgery bilateral  Recent ultrasound guided injection to bilateral feet   PRECAUTIONS: none   SUBJECTIVE:  SUBJECTIVE STATEMENT:  I am feeling alright  may a 2/10 pain but it gets worse at night.   PAIN:  Are you having pain?No   OBJECTIVE: (objective measures completed at initial evaluation unless otherwise dated)  DIAGNOSTIC FINDINGS:  No recent imaging on file for Lt foot    Rt foot MRI IMPRESSION: 1. 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 2. Mild arthritic changes of the medial hallux sesamoid-metatarsal articulation. 3. A 4.4 mm soft tissue mass along the plantar aspect between the second and third metatarsal heads concerning for a small plantar neuroma.   PATIENT SURVEYS:  FOTO 56% function to 66% predicted     COGNITION: Overall cognitive status: Within functional limits for tasks assessed                         SENSATION: Light touch: hypersensitivity 2nd digit bilaterally (dorsal aspect)   EDEMA:  None present      POSTURE: No Significant postural limitations   PALPATION: Hypersensitive to light palpation to dorsal aspect of 2nd digit bilaterally   LOWER EXTREMITY ROM:   Active ROM Right eval Left eval 07/15/22 Left  07/22/22 Left   Hip flexion        Hip extension        Hip abduction        Hip adduction        Hip internal rotation        Hip external rotation        Knee flexion        Great toe passive extension  30 30    Ankle dorsiflexion '10 6 6 10  '$ Ankle plantarflexion WNL WNL    Ankle inversion WNL WNL    Ankle eversion WNL WNL     (Blank rows = not tested)   LOWER EXTREMITY MMT:   MMT Right eval Left eval  Hip flexion      Hip extension      Hip abduction      Hip adduction      Hip internal rotation      Hip external rotation      Knee flexion      Knee extension      Ankle dorsiflexion 5 5  Ankle plantarflexion Can complete SL calf raise Can complete SL calf raise   Ankle inversion 5 5  Ankle eversion 5 5   (Blank rows = not tested)   Digit flexion: RLE 5/5 LLE 3-/5 Able to abduct Rt digits, unable to abduct Lt digits    LOWER EXTREMITY SPECIAL TESTS:  N/A   FUNCTIONAL TESTS:  SLS LLE 5 seconds; RLE 30 seconds    GAIT: Distance walked: 20 ft Assistive device utilized: None Level of assistance: Complete Independence Comments: limited push-off, excessive pronation  [facility] Adult PT Treatment:                                                DATE: 07-24-22  Aquatic therapy at [address] - therapeutic pool temp 87 degrees Pt enters building ambulating independently Treatment took place in water 3.8 to  4 ft 8 in.feet deep depending upon activity.  Pt entered and exited the pool via stair and handrails independently.   Pt pain  level 2/10  at initiation of water walking. Aquatic Exercise:  Heel/ toe raises BIL   SL Heel raise on R  x 20 Hip ext/flex with knee straight R x 20   Hamstring curl x20 BIL  Hip circles CW/CCW x10 BIL   Hip abduction/adduction x10 BIL  Figure 4 squat stretch x30" BIL Tip toe stepping across 2 lengths of pool. Squats x 20 with BIL UE support Lunge walking with emphasis on Great toe stretch on bil LE Wood pecker Anterior x 20 with SL on RT LE and LT LE Woodpecker Lateral x 20 with SL on RT LE and LT LE Woodpecker medial x 20 with SL on RT LE and LT LE With ball of great toe on step edge heel raise x 20 and the SL x 20 each   Sitting On step with UE support Bicycle kicks x1' Reverse bicycle kicks x1' Flutter kicks x1' Scissor kicks x 1' Manual aquatics stretch and mobilization of Great toe Bil.   Pt requires the buoyancy of water for active assisted exercises with buoyancy supported for strengthening and AROM exercises. Hydrostatic pressure also supports joints by unweighting joint load by at least 50 % in 3-4 feet depth water. 80% in chest to neck deep water. Water will provide assistance with movement using the current and laminar flow while the buoyancy reduces weight bearing. Pt requires the viscosity of the water for resistance with strengthening exercises.   [facility] Adult PT Treatment:                                                DATE: 07/22/22 Therapeutic Exercise: Rockerboard A/P 2 x 10  SL calf raise 2 x 10  Updated HEP  Manual Therapy: Desensitization 2nd digit Passive toe flexion and extension ROM MT glides Great toe and 2nd toe A/P mobilizations grade II-III DTM bilateral plantar fascia  Demo and issued tennis ball for self-soft tissue mobilization   Neuromuscular Re-ed Tandem on airex unable SL on airex unable Romberg on airex eyes closed 5 x 10 sec Tandem walks fwd 3 x 15 ft, backward x 15 ft    [facility] Adult PT Treatment:                                                 DATE: 07/15/22 Therapeutic Exercise: Toe curls x 10 each Toe yoga x 10 each  Seated marble pickups attempted unable Putty toe squeezes x 10 each Updated HEP Manual Therapy: Desensitization 2nd digit with pillow case Passive toe flexion and extension ROM MT glides Great toe and 2nd toe A/P mobilizations grade II-III   [facility] Adult PT Treatment:                                                DATE: 07/02/22 Therapeutic Exercise: Demonstrated and issue initial HEP.    Therapeutic Activity: Education on assessment findings that will be addressed throughout duration of POC.          PATIENT EDUCATION:  Education details: see treatment  Person educated: Patient Education method: Explanation, Demonstration, Tactile cues, Verbal cues, and  Handouts Education comprehension: verbalized understanding, returned demonstration, verbal cues required, tactile cues required, and needs further education   HOME EXERCISE PROGRAM: Access Code: [email or web address] URL: [email or web address] Date: 07/02/2022 Prepared by: Gwendolyn Grant   Exercises - Towel Desensitization  - 3 x daily - 7 x weekly - Seated Self Great Toe Stretch  - 2 x daily - 7 x weekly - 3 sets - 30 sec  hold - Long Sitting Calf Stretch with Strap  - 2 x daily - 7 x weekly - 3 sets - 30 sec  hold - Seated Great Toe Extension  - 2 x daily - 7 x weekly - 2 sets - 10 reps   ASSESSMENT:   CLINICAL IMPRESSION:  Mr Munford introduced to [employer] and acclimated to water well today. Session today focused on LE strengthening/ ankle/toe mobillty  and balance/activity tolerance in the aquatic environment for use of buoyancy to offload joints and the viscosity of water as resistance during therapeutic exercise.  Patient was able to tolerate all prescribed exercises in the aquatic environment with no adverse effects and reports 2/10 at beginning of session and  0/10 pain at the end of the session in water. Patient continues to benefit from  skilled PT services on land and aquatic based and should be progressed as able to improve functional independence.     OBJECTIVE IMPAIRMENTS: Abnormal gait, decreased activity tolerance, decreased balance, decreased knowledge of condition, difficulty walking, decreased ROM, decreased strength, impaired sensation, and pain.    ACTIVITY LIMITATIONS: standing and locomotion level   PARTICIPATION LIMITATIONS: shopping, community activity, and golf   PERSONAL FACTORS: Age, Time since onset of injury/illness/exacerbation, and 3+ comorbidities: see past medical/past surgical history above  are also affecting patient's functional outcome.    REHAB POTENTIAL: Good   CLINICAL DECISION MAKING: Evolving/moderate complexity   EVALUATION COMPLEXITY: Moderate     GOALS: Goals reviewed with patient? Yes   SHORT TERM GOALS: Target date: 07/26/2022     Patient will be independent and compliant with initial HEP.    Baseline: issued at eval  Goal status: met   2.  Patient will be able to tolerate desensitization using a towel for at least 5 minutes to reduce his sensitivity to his bed sheets when sleeping.  Baseline: does not tolerate light touch to 2nd digits; reports extreme pain when his sheets rub on his 2nd digit  Goal status: INITIAL   3.  Patient will demonstrate at least 10 degrees of Lt ankle DF AROM to improve gait mechanics.  Baseline: see above  Goal status: met     LONG TERM GOALS: Target date: 08/16/2022       Patient will demonstrate at least 40 degrees of bilateral great toe extension PROM to improve push-off.  Baseline: see above  Goal status: INITIAL   2.  Patient will maintain LLE SLS for at least 30 seconds to improve his stability when walking on uneven surfaces.  Baseline: see above  Goal status: INITIAL   3.  Patient will score at least 66% on FOTO to signify clinically meaningful improvement in functional abilities.    Baseline: see above  Goal status: INITIAL    4.  Patient will report pain at worst rated as 3/10 to reduce his current functional limitations.  Baseline: 7/10  Goal status: INITIAL       PLAN:   PT FREQUENCY: 1-2x/week   PT DURATION: 6 weeks   PLANNED INTERVENTIONS: Therapeutic exercises, Therapeutic activity, Neuromuscular re-education, Balance training, Gait training,  Patient/Family education, Self Care, Joint mobilization, Aquatic Therapy, Dry Needling, Cryotherapy, Moist heat, Manual therapy, and Re-evaluation   PLAN FOR NEXT SESSION: review and progress HEP; desensitization, manual to bilateral feet; foot intrinsic strengthening. balance and ankle strengthening    Voncille Lo, PT, [facility] Certified Exercise Expert for the Aging Adult  07/24/22 3:13 PM Phone: [phone] Fax: [phone]

## 2022-07-24 ENCOUNTER — Ambulatory Visit: Payer: Medicare Other | Admitting: Physical Therapy

## 2022-07-24 DIAGNOSIS — R262 Difficulty in walking, not elsewhere classified: Secondary | ICD-10-CM

## 2022-07-24 DIAGNOSIS — M79672 Pain in left foot: Secondary | ICD-10-CM

## 2022-07-24 DIAGNOSIS — M6281 Muscle weakness (generalized): Secondary | ICD-10-CM

## 2022-07-24 DIAGNOSIS — M79671 Pain in right foot: Secondary | ICD-10-CM

## 2022-07-29 ENCOUNTER — Ambulatory Visit: Payer: Medicare Other

## 2022-07-29 DIAGNOSIS — M79672 Pain in left foot: Secondary | ICD-10-CM | POA: Diagnosis not present

## 2022-07-29 DIAGNOSIS — R262 Difficulty in walking, not elsewhere classified: Secondary | ICD-10-CM

## 2022-07-29 DIAGNOSIS — M79671 Pain in right foot: Secondary | ICD-10-CM

## 2022-07-29 DIAGNOSIS — M6281 Muscle weakness (generalized): Secondary | ICD-10-CM

## 2022-07-29 NOTE — Therapy (Signed)
OUTPATIENT PHYSICAL THERAPY TREATMENT NOTE   Patient Name: [name] MRN: [id number] DOB:17-Aug-1945, 77 y.o., male Today's Date: 07/29/2022  PCP: [name], MD REFERRING PROVIDER: Jobe Igo, DPM    END OF SESSION:   PT End of Session - 07/29/22 1412     Visit Number 5    Number of Visits 13    Date for PT Re-Evaluation 08/17/22    Authorization Type [employer] MCR    Progress Note Due on Visit 10    PT Start Time 5638    PT Stop Time 1500    PT Time Calculation (min) 45 min    Activity Tolerance Patient tolerated treatment well    Behavior During Therapy [facility] for tasks assessed/performed                Past Medical History:  Diagnosis Date   Allergy    Bacteremia due to Gram-negative bacteria 04/13/2015   Bladder rupture 2010   Bladder tumor    Cancer ([place])    bladder   Chronic kidney disease    Depression    Gout    History of unilateral nephrectomy 07/2014   [facility]   Hyperlipidemia    Restless leg syndrome, controlled    Past Surgical History:  Procedure Laterality Date   APPENDECTOMY     BLADDER SURGERY  2010   COLONOSCOPY  06/05/2009   CYSTOSCOPY W/ RETROGRADES Bilateral 04/21/2013   Procedure: CYSTOSCOPY WITH RETROGRADE PYELOGRAM;  Surgeon: Molli Hazard, MD;  Location: [facility];  Service: Urology;  Laterality: Bilateral;   FRACTURE SURGERY Right 01/05/2005   lower leg/ankle   NASAL SEPTUM SURGERY     TONSILLECTOMY AND ADENOIDECTOMY     TRANSURETHRAL RESECTION OF BLADDER TUMOR WITH GYRUS (TURBT-GYRUS) N/A 04/21/2013   Procedure: TRANSURETHRAL RESECTION OF BLADDER TUMOR WITH GYRUS (TURBT-GYRUS), cystoscopy;  Surgeon: Molli Hazard, MD;  Location: [facility];  Service: Urology;  Laterality: N/A;   Patient Active Problem List   Diagnosis Date Noted   MRSA (methicillin resistant staph aureus) culture positive 12/27/2019   UTI (urinary tract infection) 08/24/2016   Fever 08/24/2016   History of nephrectomy 08/24/2016   CKD (chronic  kidney disease) stage 3, GFR 30-59 ml/min ([place]) 08/24/2016   Sepsis due to Escherichia coli (E. coli) ([place]) 04/14/2015   Bacteremia due to Gram-negative bacteria ([place]), E. Coli 04/14/2015   UTI (lower urinary tract infection) 04/13/2015   Anemia of chronic kidney failure 04/13/2015   Acute hyponatremia 04/13/2015   Bladder cancer ([place]) 06/23/2013   Hyperlipidemia    GERD (gastroesophageal reflux disease) 12/22/2012    REFERRING DIAG: V56.43 (ICD-10-CM) - Lesion of plantar nerve, left lower limb    THERAPY DIAG:  Pain in left foot  Pain in right foot  Difficulty in walking, not elsewhere classified  Muscle weakness (generalized)  Rationale for Evaluation and Treatment Rehabilitation  PERTINENT HISTORY:  Bladder cancer Chronic kidney disease History of unilateral nephrectomy  Fracture surgery of Rt ankle 2006 Morton's neuroma excision bilateral Stump neuroma revision surgery bilateral  Recent ultrasound guided injection to bilateral feet   PRECAUTIONS: none   SUBJECTIVE:  SUBJECTIVE STATEMENT:  "Right now it's a 0."   PAIN:  Are you having pain?No   OBJECTIVE: (objective measures completed at initial evaluation unless otherwise dated)  DIAGNOSTIC FINDINGS:  No recent imaging on file for Lt foot    Rt foot MRI IMPRESSION: 1. 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 2. Mild arthritic changes of the medial hallux sesamoid-metatarsal articulation. 3. A 4.4 mm soft tissue mass along the plantar aspect between the second and third metatarsal heads concerning for a small plantar neuroma.   PATIENT SURVEYS:  FOTO 56% function to 66% predicted    COGNITION: Overall cognitive status: Within functional  limits for tasks assessed                         SENSATION: Light touch: hypersensitivity 2nd digit bilaterally (dorsal aspect)   EDEMA:  None present      POSTURE: No Significant postural limitations   PALPATION: Hypersensitive to light palpation to dorsal aspect of 2nd digit bilaterally   LOWER EXTREMITY ROM:   Active ROM Right eval Left eval 07/15/22 Left  07/22/22 Left  07/29/22  Hip flexion         Hip extension         Hip abduction         Hip adduction         Hip internal rotation         Hip external rotation         Knee flexion         Great toe passive extension  30 30   Right: 45; left: 40   Ankle dorsiflexion '10 6 6 10   '$ Ankle plantarflexion WNL WNL     Ankle inversion WNL WNL     Ankle eversion WNL WNL      (Blank rows = not tested)   LOWER EXTREMITY MMT:   MMT Right eval Left eval  Hip flexion      Hip extension      Hip abduction      Hip adduction      Hip internal rotation      Hip external rotation      Knee flexion      Knee extension      Ankle dorsiflexion 5 5  Ankle plantarflexion Can complete SL calf raise Can complete SL calf raise   Ankle inversion 5 5  Ankle eversion 5 5   (Blank rows = not tested)   Digit flexion: RLE 5/5 LLE 3-/5 Able to abduct Rt digits, unable to abduct Lt digits    LOWER EXTREMITY SPECIAL TESTS:  N/A   FUNCTIONAL TESTS:  SLS LLE 5 seconds; RLE 30 seconds    GAIT: Distance walked: 20 ft Assistive device utilized: None Level of assistance: Complete Independence Comments: limited push-off, excessive pronation   [facility] Adult PT Treatment:                                                DATE: 07/29/22 Therapeutic Exercise: Recumbent bike level 4 x 5 minutes  SL calf raise 2 x 10  Heel/toe walks 3 sets d/b x 10 ft  SL rockerboard 2 x 10  Updated HEP Manual Therapy: Desensitization 2nd digit Passive toe flexion and extension ROM MT  glides Neuromuscular re-ed: SLS 3 each 10-30 seconds  Tandem eyes  closed 3 trials 5-10 seconds Tandem on airex balance beam 3 sets d/b  Trigger Point Dry Needling Treatment: Pre-treatment instruction: Patient instructed on dry needling rationale, procedures, and possible side effects including pain during treatment (achy,cramping feeling), bruising, drop of blood, lightheadedness, nausea, sweating. Patient Consent Given: Yes Education handout provided: Yes Muscles treated: bilateral dorsal interossei   Needle size and number: .25x53m x 1 Electrical stimulation perforamed: No Parameters: N/A Treatment response/outcome: Palpable decrease in muscle tension Post-treatment instructions: Patient instructed to expect possible mild to moderate muscle soreness later today and/or tomorrow. Patient instructed in methods to reduce muscle soreness and to continue prescribed HEP. If patient was dry needled over the lung field, patient was instructed on signs and symptoms of pneumothorax and, however unlikely, to see immediate medical attention should they occur. Patient was also educated on signs and symptoms of infection and to seek medical attention should they occur. Patient verbalized understanding of these instructions and education.  [facility]                                                [facility]: 07[facility]22  [address] therapeutic pool temp 87 degrees Pt enters building ambulating independently Treatment took place in water 3.8 to  4 ft 8 in.feet deep depending upon activity.  Pt entered and exited the pool via stair and handrails independently.   Pt pain level 2/10 at initiation of water walking. Aquatic Exercise:  Heel/ toe raises BIL   SL Heel raise on R  x 20 Hip ext/flex with knee straight R x 20   Hamstring curl x20 BIL  Hip circles CW/CCW x10 BIL   Hip abduction/adduction x10 BIL  Figure 4 squat stretch x30" BIL Tip toe stepping across 2 lengths of pool. Squats x 20 with BIL UE support Lunge walking  with emphasis on Great toe stretch on bil LE Wood pecker Anterior x 20 with SL on RT LE and LT LE Woodpecker Lateral x 20 with SL on RT LE and LT LE Woodpecker medial x 20 with SL on RT LE and LT LE With ball of great toe on step edge heel raise x 20 and the SL x 20 each   Sitting On step with UE support Bicycle kicks x1' Reverse bicycle kicks x1' Flutter kicks x1' Scissor kicks x 1' Manual aquatics stretch and mobilization of Great toe Bil.   Pt requires the buoyancy of water for active assisted exercises with buoyancy supported for strengthening and AROM exercises. Hydrostatic pressure also supports joints by unweighting joint load by at least 50 % in 3-4 feet depth water. 80% in chest to neck deep water. Water will provide assistance with movement using the current and laminar flow while the buoyancy reduces weight bearing. Pt requires the viscosity of the water for resistance with strengthening exercises.   [facility]                                                [facility]: 07/22/22 Therapeutic Exercise: Rockerboard A/P 2 x 10  SL calf raise 2 x 10  Updated HEP  Manual Therapy: Desensitization 2nd digit Passive toe flexion and extension ROM MT glides Great toe and 2nd toe A/P mobilizations grade II-III DTM bilateral plantar fascia  Demo and issued tennis ball for self-soft tissue mobilization   Neuromuscular Re-ed Tandem on airex unable SL on airex unable Romberg on airex eyes closed 5 x 10 sec Tandem walks fwd 3 x 15 ft, backward x 15 ft      PATIENT EDUCATION:  Education details: see treatment  Person educated: Patient Education method: Explanation, Demonstration, Tactile cues, Verbal cues, and Handouts Education comprehension: verbalized understanding, returned demonstration, verbal cues required, tactile cues required, and needs further education   HOME EXERCISE PROGRAM: Access Code: [email or web address] URL: [email or web address] Date:  07/02/2022 Prepared by: Gwendolyn Grant   Exercises - Towel Desensitization  - 3 x daily - 7 x weekly - Seated Self Great Toe Stretch  - 2 x daily - 7 x weekly - 3 sets - 30 sec  hold - Long Sitting Calf Stretch with Strap  - 2 x daily - 7 x weekly - 3 sets - 30 sec  hold - Seated Great Toe Extension  - 2 x daily - 7 x weekly - 2 sets - 10 reps   ASSESSMENT:   CLINICAL IMPRESSION:  Patient arrives without reports of foot pain. His passive great toe extension has improved bilaterally having met this LTG. Continued with progression of standing strengthening and balance activity. He is challenged with progression of static balance activity requiring use of stepping strategy to maintain balance after brief hold of SLS and tandem. He reported mild pain with toe walking, otherwise good tolerance to progression of strength/balance without reports of pain.     OBJECTIVE IMPAIRMENTS: Abnormal gait, decreased activity tolerance, decreased balance, decreased knowledge of condition, difficulty walking, decreased ROM, decreased strength, impaired sensation, and pain.    ACTIVITY LIMITATIONS: standing and locomotion level   PARTICIPATION LIMITATIONS: shopping, community activity, and golf   PERSONAL FACTORS: Age, Time since onset of injury/illness/exacerbation, and 3+ comorbidities: see past medical/past surgical history above  are also affecting patient's functional outcome.    REHAB POTENTIAL: Good   CLINICAL DECISION MAKING: Evolving/moderate complexity   EVALUATION COMPLEXITY: Moderate     GOALS: Goals reviewed with patient? Yes   SHORT TERM GOALS: Target date: 07/26/2022     Patient will be independent and compliant with initial HEP.    Baseline: issued at eval  Goal status: met   2.  Patient will be able to tolerate desensitization using a towel for at least 5 minutes to reduce his sensitivity to his bed sheets when sleeping.  Baseline: does not tolerate light touch to 2nd digits;  reports extreme pain when his sheets rub on his 2nd digit  Status: 5-10 minutes of desensitization  Goal status: met   3.  Patient will demonstrate at least 10 degrees of Lt ankle DF AROM to improve gait mechanics.  Baseline: see above  Goal status: met     LONG TERM GOALS: Target date: 08/16/2022       Patient will demonstrate at least 40 degrees of bilateral great toe extension PROM to improve push-off.  Baseline: see above  Goal status: met   2.  Patient will maintain LLE SLS for at least 30 seconds to improve his stability when walking on uneven surfaces.  Baseline: see above  Goal status: INITIAL   3.  Patient will score at least 66% on FOTO to signify clinically meaningful improvement in functional abilities.  Baseline: see above  Goal status: INITIAL   4.  Patient will report pain at worst rated as 3/10 to reduce his current functional limitations.  Baseline: 7/10  Goal status: INITIAL       PLAN:   PT FREQUENCY: 1-2x/week   PT DURATION: 6 weeks   PLANNED INTERVENTIONS: Therapeutic exercises, Therapeutic activity, Neuromuscular re-education, Balance training, Gait training, Patient/Family education, Self Care, Joint mobilization, Aquatic Therapy, Dry Needling, Cryotherapy, Moist heat, Manual therapy, and Re-evaluation   PLAN FOR NEXT SESSION: review and progress HEP; desensitization, manual to bilateral feet; foot intrinsic strengthening. balance and ankle strengthening    Gwendolyn Grant, PT, DPT, ATC 07/29/22 3:01 PM

## 2022-07-29 NOTE — Patient Instructions (Signed)

## 2022-07-31 ENCOUNTER — Ambulatory Visit: Payer: Medicare Other | Admitting: Physical Therapy

## 2022-07-31 ENCOUNTER — Encounter: Payer: Self-pay | Admitting: Physical Therapy

## 2022-07-31 ENCOUNTER — Other Ambulatory Visit: Payer: Self-pay

## 2022-07-31 DIAGNOSIS — M79671 Pain in right foot: Secondary | ICD-10-CM

## 2022-07-31 DIAGNOSIS — M6281 Muscle weakness (generalized): Secondary | ICD-10-CM

## 2022-07-31 DIAGNOSIS — M79672 Pain in left foot: Secondary | ICD-10-CM

## 2022-07-31 DIAGNOSIS — R262 Difficulty in walking, not elsewhere classified: Secondary | ICD-10-CM

## 2022-07-31 NOTE — Therapy (Signed)
OUTPATIENT PHYSICAL THERAPY TREATMENT NOTE   Patient Name: [name] MRN: [id number] DOB:28-Apr-1946, 77 y.o., male Today's Date: 07/31/2022  PCP: [name], MD REFERRING PROVIDER: Jobe Igo, DPM    END OF SESSION:   PT End of Session - 07/31/22 1324     Visit Number 6    Number of Visits 13    Date for PT Re-Evaluation 08/17/22    Authorization Type [employer] MCR    PT Start Time 1330    PT Stop Time 1415    PT Time Calculation (min) 45 min    Activity Tolerance Patient tolerated treatment well    Behavior During Therapy [facility] for tasks assessed/performed                Past Medical History:  Diagnosis Date   Allergy    Bacteremia due to Gram-negative bacteria 04/13/2015   Bladder rupture 2010   Bladder tumor    Cancer ([place])    bladder   Chronic kidney disease    Depression    Gout    History of unilateral nephrectomy 07/2014   [facility]   Hyperlipidemia    Restless leg syndrome, controlled    Past Surgical History:  Procedure Laterality Date   APPENDECTOMY     BLADDER SURGERY  2010   COLONOSCOPY  06/05/2009   CYSTOSCOPY W/ RETROGRADES Bilateral 04/21/2013   Procedure: CYSTOSCOPY WITH RETROGRADE PYELOGRAM;  Surgeon: Molli Hazard, MD;  Location: [facility];  Service: Urology;  Laterality: Bilateral;   FRACTURE SURGERY Right 01/05/2005   lower leg/ankle   NASAL SEPTUM SURGERY     TONSILLECTOMY AND ADENOIDECTOMY     TRANSURETHRAL RESECTION OF BLADDER TUMOR WITH GYRUS (TURBT-GYRUS) N/A 04/21/2013   Procedure: TRANSURETHRAL RESECTION OF BLADDER TUMOR WITH GYRUS (TURBT-GYRUS), cystoscopy;  Surgeon: Molli Hazard, MD;  Location: [facility];  Service: Urology;  Laterality: N/A;   Patient Active Problem List   Diagnosis Date Noted   MRSA (methicillin resistant staph aureus) culture positive 12/27/2019   UTI (urinary tract infection) 08/24/2016   Fever 08/24/2016   History of nephrectomy 08/24/2016   CKD (chronic kidney disease) stage 3, GFR 30-59  ml/min ([place]) 08/24/2016   Sepsis due to Escherichia coli (E. coli) ([place]) 04/14/2015   Bacteremia due to Gram-negative bacteria ([place]), E. Coli 04/14/2015   UTI (lower urinary tract infection) 04/13/2015   Anemia of chronic kidney failure 04/13/2015   Acute hyponatremia 04/13/2015   Bladder cancer ([place]) 06/23/2013   Hyperlipidemia    GERD (gastroesophageal reflux disease) 12/22/2012    REFERRING DIAG: R67.89 (ICD-10-CM) - Lesion of plantar nerve, left lower limb    THERAPY DIAG:  Pain in left foot  Pain in right foot  Difficulty in walking, not elsewhere classified  Muscle weakness (generalized)  Rationale for Evaluation and Treatment Rehabilitation  PERTINENT HISTORY:  Bladder cancer Chronic kidney disease History of unilateral nephrectomy  Fracture surgery of Rt ankle 2006 Morton's neuroma excision bilateral Stump neuroma revision surgery bilateral  Recent ultrasound guided injection to bilateral feet   PRECAUTIONS: none   SUBJECTIVE:  SUBJECTIVE STATEMENT:  "Right now it's a 0."   PAIN:  Are you having pain?No   OBJECTIVE: (objective measures completed at initial evaluation unless otherwise dated)  DIAGNOSTIC FINDINGS:  No recent imaging on file for Lt foot    Rt foot MRI IMPRESSION: 1. 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 2. Mild arthritic changes of the medial hallux sesamoid-metatarsal articulation. 3. A 4.4 mm soft tissue mass along the plantar aspect between the second and third metatarsal heads concerning for a small plantar neuroma.   PATIENT SURVEYS:  FOTO 56% function to 66% predicted    COGNITION: Overall cognitive status: Within functional limits for tasks assessed                          SENSATION: Light touch: hypersensitivity 2nd digit bilaterally (dorsal aspect)   EDEMA:  None present      POSTURE: No Significant postural limitations   PALPATION: Hypersensitive to light palpation to dorsal aspect of 2nd digit bilaterally   LOWER EXTREMITY ROM:   Active ROM Right eval Left eval 07/15/22 Left  07/22/22 Left  07/29/22  Hip flexion         Hip extension         Hip abduction         Hip adduction         Hip internal rotation         Hip external rotation         Knee flexion         Great toe passive extension  30 30   Right: 45; left: 40   Ankle dorsiflexion '10 6 6 10   '$ Ankle plantarflexion WNL WNL     Ankle inversion WNL WNL     Ankle eversion WNL WNL      (Blank rows = not tested)   LOWER EXTREMITY MMT:   MMT Right eval Left eval  Hip flexion      Hip extension      Hip abduction      Hip adduction      Hip internal rotation      Hip external rotation      Knee flexion      Knee extension      Ankle dorsiflexion 5 5  Ankle plantarflexion Can complete SL calf raise Can complete SL calf raise   Ankle inversion 5 5  Ankle eversion 5 5   (Blank rows = not tested)   Digit flexion: RLE 5/5 LLE 3-/5 Able to abduct Rt digits, unable to abduct Lt digits    LOWER EXTREMITY SPECIAL TESTS:  N/A   FUNCTIONAL TESTS:  SLS LLE 5 seconds; RLE 30 seconds    GAIT: Distance walked: 20 ft Assistive device utilized: None Level of assistance: Complete Independence Comments: limited push-off, excessive pronation  [facility] Adult PT Treatment:                                                DATE: 07-31-22 Aquatic therapy at [address] - therapeutic pool temp 91 degrees Pt enters building ambulating independently Treatment took place in water 3.8 to  4 ft 8 in.feet deep depending upon activity.  Pt entered and exited the pool via stair and handrails independently.  Pt pain level 1/10 at initiation of water walking. Aquatic Exercise:   Heel/ toe raises BIL   SL Heel raise on R  x 20 Using pool noodle heel toe forward and backward, Tandem stepping forward and backward Using ankle fins with all exercises below unless otherwise stated Hip ext/flex with knee straight R x 20   Hamstring curl x20 BIL  Hip circles CW/CCW x20 BIL   Hip abduction/adduction x20 BIL  Figure 4 squat stretch x30" BIL Tip toe stepping across 2 lengths of pool Runners Stretch x 30" x 2 BIL Hamstring stretch x 30" x 2 BIL Squats x 20 with BIL UE support Lunge walking with emphasis on Great toe stretch on bil LE Jumping jacks and Rocking Horse for a minute each for 3 rounds for power Jogging forwards and backwards.    Sitting On step with UE support Bicycle kicks x1' Reverse bicycle kicks x1' Flutter kicks x1' Scissor kicks x 1' Manual aquatics stretch and mobilization of Great toe Bil.  and additional toes to tolerance with cavitation on 2nd toes  Pt requires the buoyancy of water for active assisted exercises with buoyancy supported for strengthening and AROM exercises. Hydrostatic pressure also supports joints by unweighting joint load by at least 50 % in 3-4 feet depth water. 80% in chest to neck deep water. Water will provide assistance with movement using the current and laminar flow while the buoyancy reduces weight bearing. Pt requires the viscosity of the water for resistance with strengthening exercises.  [facility] Adult PT Treatment:                                                DATE: 07/29/22 Therapeutic Exercise: Recumbent bike level 4 x 5 minutes  SL calf raise 2 x 10  Heel/toe walks 3 sets d/b x 10 ft  SL rockerboard 2 x 10  Updated HEP Manual Therapy: Desensitization 2nd digit Passive toe flexion and extension ROM MT glides Neuromuscular re-ed: SLS 3 each 10-30 seconds  Tandem eyes closed 3 trials 5-10 seconds Tandem on airex balance beam 3 sets d/b  Trigger Point Dry Needling Treatment: Pre-treatment instruction: Patient  instructed on dry needling rationale, procedures, and possible side effects including pain during treatment (achy,cramping feeling), bruising, drop of blood, lightheadedness, nausea, sweating. Patient Consent Given: Yes Education handout provided: Yes Muscles treated: bilateral dorsal interossei   Needle size and number: .25x63m x 1 Electrical stimulation perforamed: No Parameters: N/A Treatment response/outcome: Palpable decrease in muscle tension Post-treatment instructions: Patient instructed to expect possible mild to moderate muscle soreness later today and/or tomorrow. Patient instructed in methods to reduce muscle soreness and to continue prescribed HEP. If patient was dry needled over the lung field, patient was instructed on signs and symptoms of pneumothorax and, however unlikely, to see immediate medical attention should they occur. Patient was also educated on signs and symptoms of infection and to seek medical attention should they occur. Patient verbalized understanding of these instructions and education.  [facility]                                                [facility]: [facility]  [address]  temp 87 degrees Pt enters building ambulating independently Treatment took place in water 3.8 to  4 ft 8 in.feet deep depending upon activity.  Pt entered and exited the pool via stair and handrails independently.   Pt pain level 2/10 at initiation of water walking. Aquatic Exercise:  Heel/ toe raises BIL   SL Heel raise on R  x 20 Hip ext/flex with knee straight R x 20   Hamstring curl x20 BIL  Hip circles CW/CCW x10 BIL   Hip abduction/adduction x10 BIL  Figure 4 squat stretch x30" BIL Tip toe stepping across 2 lengths of pool. Squats x 20 with BIL UE support Lunge walking with emphasis on Great toe stretch on bil LE Wood pecker Anterior x 20 with SL on RT LE and LT LE Woodpecker Lateral x 20 with SL on RT LE and LT  LE Woodpecker medial x 20 with SL on RT LE and LT LE With ball of great toe on step edge heel raise x 20 and the SL x 20 each   Sitting On step with UE support Bicycle kicks x1' Reverse bicycle kicks x1' Flutter kicks x1' Scissor kicks x 1' Manual aquatics stretch and mobilization of Great toe Bil.   Pt requires the buoyancy of water for active assisted exercises with buoyancy supported for strengthening and AROM exercises. Hydrostatic pressure also supports joints by unweighting joint load by at least 50 % in 3-4 feet depth water. 80% in chest to neck deep water. Water will provide assistance with movement using the current and laminar flow while the buoyancy reduces weight bearing. Pt requires the viscosity of the water for resistance with strengthening exercises.   [facility] Adult PT Treatment:                                                DATE: 07/22/22 Therapeutic Exercise: Rockerboard A/P 2 x 10  SL calf raise 2 x 10  Updated HEP  Manual Therapy: Desensitization 2nd digit Passive toe flexion and extension ROM MT glides Great toe and 2nd toe A/P mobilizations grade II-III DTM bilateral plantar fascia  Demo and issued tennis ball for self-soft tissue mobilization   Neuromuscular Re-ed Tandem on airex unable SL on airex unable Romberg on airex eyes closed 5 x 10 sec Tandem walks fwd 3 x 15 ft, backward x 15 ft      PATIENT EDUCATION:  Education details: see treatment  Person educated: Patient Education method: Explanation, Demonstration, Tactile cues, Verbal cues, and Handouts Education comprehension: verbalized understanding, returned demonstration, verbal cues required, tactile cues required, and needs further education   HOME EXERCISE PROGRAM: Access Code: [email or web address] URL: [email or web address] Date: 07/02/2022 Prepared by: Gwendolyn Grant   Exercises - Towel Desensitization  - 3 x daily - 7 x weekly - Seated Self Great Toe Stretch  - 2 x daily - 7 x weekly - 3  sets - 30 sec  hold - Long Sitting Calf Stretch with Strap  - 2 x daily - 7 x weekly - 3 sets - 30 sec  hold - Seated Great Toe Extension  - 2 x daily - 7 x weekly - 2 sets - 10 reps   ASSESSMENT:   CLINICAL IMPRESSION:   Mr Treadway returns to [employer] and acclimated to water well today. Pt with 1/10 pain at initiation Session today focused on LE strengthening/  power and endurance in aquatics and ankle/toe mobillty Pt benefits from exercise aquatic environment for use of buoyancy to offload joints and the viscosity of water as resistance during therapeutic exercise.  Patient was able to tolerate all prescribed exercises in the aquatic environment with no adverse effects and reports 1/10 at beginning of session and  0/10 pain at the end of the session in water even with added challenge. Patient continues to benefit from skilled PT services on land and aquatic based and should be progressed as able to improve functional independence.      OBJECTIVE IMPAIRMENTS: Abnormal gait, decreased activity tolerance, decreased balance, decreased knowledge of condition, difficulty walking, decreased ROM, decreased strength, impaired sensation, and pain.    ACTIVITY LIMITATIONS: standing and locomotion level   PARTICIPATION LIMITATIONS: shopping, community activity, and golf   PERSONAL FACTORS: Age, Time since onset of injury/illness/exacerbation, and 3+ comorbidities: see past medical/past surgical history above  are also affecting patient's functional outcome.    REHAB POTENTIAL: Good   CLINICAL DECISION MAKING: Evolving/moderate complexity   EVALUATION COMPLEXITY: Moderate     GOALS: Goals reviewed with patient? Yes   SHORT TERM GOALS: Target date: 07/26/2022     Patient will be independent and compliant with initial HEP.    Baseline: issued at eval  Goal status: met   2.  Patient will be able to tolerate desensitization using a towel for at least 5 minutes to reduce his sensitivity to his bed  sheets when sleeping.  Baseline: does not tolerate light touch to 2nd digits; reports extreme pain when his sheets rub on his 2nd digit  Status: 5-10 minutes of desensitization  Goal status: met   3.  Patient will demonstrate at least 10 degrees of Lt ankle DF AROM to improve gait mechanics.  Baseline: see above  Goal status: met     LONG TERM GOALS: Target date: 08/16/2022       Patient will demonstrate at least 40 degrees of bilateral great toe extension PROM to improve push-off.  Baseline: see above  Goal status: met   2.  Patient will maintain LLE SLS for at least 30 seconds to improve his stability when walking on uneven surfaces.  Baseline: see above  Goal status: INITIAL   3.  Patient will score at least 66% on FOTO to signify clinically meaningful improvement in functional abilities.    Baseline: see above  Goal status: INITIAL   4.  Patient will report pain at worst rated as 3/10 to reduce his current functional limitations.  Baseline: 7/10  Goal status: INITIAL       PLAN:   PT FREQUENCY: 1-2x/week   PT DURATION: 6 weeks   PLANNED INTERVENTIONS: Therapeutic exercises, Therapeutic activity, Neuromuscular re-education, Balance training, Gait training, Patient/Family education, Self Care, Joint mobilization, Aquatic Therapy, Dry Needling, Cryotherapy, Moist heat, Manual therapy, and Re-evaluation   PLAN FOR NEXT SESSION: review and progress HEP; desensitization, manual to bilateral feet; foot intrinsic strengthening. balance and ankle strengthening    Voncille Lo, PT, [place] Certified Exercise Expert for the Aging Adult  07/31/22 2:25 PM Phone: [phone] Fax: [phone]

## 2022-08-05 ENCOUNTER — Ambulatory Visit: Payer: Medicare Other

## 2022-08-05 DIAGNOSIS — R262 Difficulty in walking, not elsewhere classified: Secondary | ICD-10-CM

## 2022-08-05 DIAGNOSIS — M79671 Pain in right foot: Secondary | ICD-10-CM

## 2022-08-05 DIAGNOSIS — M79672 Pain in left foot: Secondary | ICD-10-CM | POA: Diagnosis not present

## 2022-08-05 DIAGNOSIS — M6281 Muscle weakness (generalized): Secondary | ICD-10-CM

## 2022-08-05 NOTE — Therapy (Signed)
OUTPATIENT PHYSICAL THERAPY TREATMENT NOTE PHYSICAL THERAPY DISCHARGE SUMMARY  Visits from Start of Care: 7  Current functional level related to goals / functional outcomes: See goals below   Remaining deficits: Bilateral foot/toe pain.     Education / Equipment: See education    Patient agrees to discharge. Patient goals were partially met. Patient is being discharged due to the patient's request.   Patient Name: [name] MRN: [id number] DOB:Oct 03, 1945, 77 y.o., male Today's Date: 08/05/2022  PCP: [name], MD REFERRING PROVIDER: Jobe Igo, DPM    END OF SESSION:   PT End of Session - 08/05/22 1356     Visit Number 7    Number of Visits 13    Date for PT Re-Evaluation 08/17/22    Authorization Type [employer] MCR    PT Start Time 7096    PT Stop Time 2836    PT Time Calculation (min) 33 min    Activity Tolerance Patient tolerated treatment well    Behavior During Therapy [facility] for tasks assessed/performed                Past Medical History:  Diagnosis Date   Allergy    Bacteremia due to Gram-negative bacteria 04/13/2015   Bladder rupture 2010   Bladder tumor    Cancer ([place])    bladder   Chronic kidney disease    Depression    Gout    History of unilateral nephrectomy 07/2014   [facility]   Hyperlipidemia    Restless leg syndrome, controlled    Past Surgical History:  Procedure Laterality Date   APPENDECTOMY     BLADDER SURGERY  2010   COLONOSCOPY  06/05/2009   CYSTOSCOPY W/ RETROGRADES Bilateral 04/21/2013   Procedure: CYSTOSCOPY WITH RETROGRADE PYELOGRAM;  Surgeon: Molli Hazard, MD;  Location: [facility];  Service: Urology;  Laterality: Bilateral;   FRACTURE SURGERY Right 01/05/2005   lower leg/ankle   NASAL SEPTUM SURGERY     TONSILLECTOMY AND ADENOIDECTOMY     TRANSURETHRAL RESECTION OF BLADDER TUMOR WITH GYRUS (TURBT-GYRUS) N/A 04/21/2013   Procedure: TRANSURETHRAL RESECTION OF BLADDER TUMOR WITH GYRUS (TURBT-GYRUS), cystoscopy;   Surgeon: Molli Hazard, MD;  Location: [facility];  Service: Urology;  Laterality: N/A;   Patient Active Problem List   Diagnosis Date Noted   MRSA (methicillin resistant staph aureus) culture positive 12/27/2019   UTI (urinary tract infection) 08/24/2016   Fever 08/24/2016   History of nephrectomy 08/24/2016   CKD (chronic kidney disease) stage 3, GFR 30-59 ml/min ([place]) 08/24/2016   Sepsis due to Escherichia coli (E. coli) ([place]) 04/14/2015   Bacteremia due to Gram-negative bacteria ([place]), E. Coli 04/14/2015   UTI (lower urinary tract infection) 04/13/2015   Anemia of chronic kidney failure 04/13/2015   Acute hyponatremia 04/13/2015   Bladder cancer ([place]) 06/23/2013   Hyperlipidemia    GERD (gastroesophageal reflux disease) 12/22/2012    REFERRING DIAG: O29.47 (ICD-10-CM) - Lesion of plantar nerve, left lower limb    THERAPY DIAG:  Pain in left foot  Pain in right foot  Difficulty in walking, not elsewhere classified  Muscle weakness (generalized)  Rationale for Evaluation and Treatment Rehabilitation  PERTINENT HISTORY:  Bladder cancer Chronic kidney disease History of unilateral nephrectomy  Fracture surgery of Rt ankle 2006 Morton's neuroma excision bilateral Stump neuroma revision surgery bilateral  Recent ultrasound guided injection to bilateral feet   PRECAUTIONS: none   SUBJECTIVE:  SUBJECTIVE STATEMENT:  Patient reports his feet are feeling the same and would like today to be his last day of PT since his pain has not improved.    PAIN:  Are you having pain?No   OBJECTIVE: (objective measures completed at initial evaluation unless otherwise dated)  DIAGNOSTIC FINDINGS:  No recent imaging on file for Lt foot    Rt foot MRI IMPRESSION: 1. 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 2. Mild arthritic changes of the medial hallux sesamoid-metatarsal articulation. 3. A 4.4 mm soft tissue mass along the plantar aspect between the second and third metatarsal heads concerning for a small plantar neuroma.   PATIENT SURVEYS:  FOTO 56% function to 66% predicted  08/05/22: 75% function    COGNITION: Overall cognitive status: Within functional limits for tasks assessed                         SENSATION: Light touch: hypersensitivity 2nd digit bilaterally (dorsal aspect)   EDEMA:  None present      POSTURE: No Significant postural limitations   PALPATION: Hypersensitive to light palpation to dorsal aspect of 2nd digit bilaterally   LOWER EXTREMITY ROM:   Active ROM Right eval Left eval 07/15/22 Left  07/22/22 Left  07/29/22 08/05/22  Hip flexion          Hip extension          Hip abduction          Hip adduction          Hip internal rotation          Hip external rotation          Knee flexion          Great toe passive extension  30 30   Right: 45; left: 40    Ankle dorsiflexion '10 6 6 10  '$ Right: 10; left: 10   Ankle plantarflexion WNL WNL      Ankle inversion WNL WNL      Ankle eversion WNL WNL       (Blank rows = not tested)   LOWER EXTREMITY MMT:   MMT Right eval Left eval  Hip flexion      Hip extension      Hip abduction      Hip adduction      Hip internal rotation      Hip external rotation      Knee flexion      Knee extension      Ankle dorsiflexion 5 5  Ankle plantarflexion Can complete SL calf raise Can complete SL calf raise   Ankle inversion 5 5  Ankle eversion 5 5   (Blank rows = not tested)   Digit flexion: RLE 5/5 LLE 3-/5 Able to abduct Rt digits, unable to abduct Lt digits    LOWER EXTREMITY SPECIAL TESTS:  N/A   FUNCTIONAL TESTS:  SLS LLE 5 seconds; RLE 30 seconds   08/05/22: SLS LLE: 13 seconds    GAIT: Distance walked:  20 ft Assistive device utilized: None Level of assistance: Complete Independence Comments: limited push-off, excessive pronation   [facility] Adult PT Treatment:  DATE: 08/05/22 Therapeutic Exercise: SL calf raise x 5 each  Toe yoga x 5 each Calf stretch at wall x 20 sec Toe curls x 5 Toe raises x 5 Reviewed and updated HEP discussing frequency, sets, reps, and way to progress  Therapeutic Activity: Re-assessment of goals.  Neuromuscular re-ed: SLS 2 trials each Semi-tandem eyes closed 2 trials each Tandem 2 trials each     [facility] Adult PT Treatment:                                                DATE: 07-31-22 Aquatic therapy at [address] - therapeutic pool temp 91 degrees Pt enters building ambulating independently Treatment took place in water 3.8 to  4 ft 8 in.feet deep depending upon activity.  Pt entered and exited the pool via stair and handrails independently.   Pt pain level 1/10 at initiation of water walking. Aquatic Exercise:  Heel/ toe raises BIL   SL Heel raise on R  x 20 Using pool noodle heel toe forward and backward, Tandem stepping forward and backward Using ankle fins with all exercises below unless otherwise stated Hip ext/flex with knee straight R x 20   Hamstring curl x20 BIL  Hip circles CW/CCW x20 BIL   Hip abduction/adduction x20 BIL  Figure 4 squat stretch x30" BIL Tip toe stepping across 2 lengths of pool Runners Stretch x 30" x 2 BIL Hamstring stretch x 30" x 2 BIL Squats x 20 with BIL UE support Lunge walking with emphasis on Great toe stretch on bil LE Jumping jacks and Rocking Horse for a minute each for 3 rounds for power Jogging forwards and backwards.    Sitting On step with UE support Bicycle kicks x1' Reverse bicycle kicks x1' Flutter kicks x1' Scissor kicks x 1' Manual aquatics stretch and mobilization of Great toe Bil.  and additional toes to tolerance with cavitation on  2nd toes  Pt requires the buoyancy of water for active assisted exercises with buoyancy supported for strengthening and AROM exercises. Hydrostatic pressure also supports joints by unweighting joint load by at least 50 % in 3-4 feet depth water. 80% in chest to neck deep water. Water will provide assistance with movement using the current and laminar flow while the buoyancy reduces weight bearing. Pt requires the viscosity of the water for resistance with strengthening exercises.  [facility] Adult PT Treatment:                                                DATE: 07/29/22 Therapeutic Exercise: Recumbent bike level 4 x 5 minutes  SL calf raise 2 x 10  Heel/toe walks 3 sets d/b x 10 ft  SL rockerboard 2 x 10  Updated HEP Manual Therapy: Desensitization 2nd digit Passive toe flexion and extension ROM MT glides Neuromuscular re-ed: SLS 3 each 10-30 seconds  Tandem eyes closed 3 trials 5-10 seconds Tandem on airex balance beam 3 sets d/b  Trigger Point Dry Needling Treatment: Pre-treatment instruction: Patient instructed on dry needling rationale, procedures, and possible side effects including pain during treatment (achy,cramping feeling), bruising, drop of blood, lightheadedness, nausea, sweating. Patient Consent Given: Yes Education handout provided: Yes Muscles treated: bilateral dorsal interossei  Needle size and number: .25x41m x 1 Electrical stimulation perforamed: No Parameters: N/A Treatment response/outcome: Palpable decrease in muscle tension Post-treatment instructions: Patient instructed to expect possible mild to moderate muscle soreness later today and/or tomorrow. Patient instructed in methods to reduce muscle soreness and to continue prescribed HEP. If patient was dry needled over the lung field, patient was instructed on signs and symptoms of pneumothorax and, however unlikely, to see immediate medical attention should they occur. Patient was also educated on signs and symptoms of  infection and to seek medical attention should they occur. Patient verbalized understanding of these instructions and education.   PATIENT EDUCATION:  Education details: see treatment; d/c education  Person educated: Patient Education method: Explanation, Demonstration, Tactile cues, Verbal cues, and Handouts Education comprehension: verbalized understanding, returned demonstration, verbal cues required, tactile cues required   HOME EXERCISE PROGRAM: Access Code: [email or web address]   ASSESSMENT:   CLINICAL IMPRESSION: DKolterhas attended 7 PT sessions reporting no change in his foot/toe pain. He demonstrates objective improvements in his ROM, balance, and strength compared to initial evaluation having met all short term functional goals and 2/4 long term functional goals. He feels that he is ready for discharge due to no change in his pain and has plans to f/u with referring provider for further assessment/treatment.     OBJECTIVE IMPAIRMENTS: Abnormal gait, decreased activity tolerance, decreased balance, decreased knowledge of condition, difficulty walking, decreased ROM, decreased strength, impaired sensation, and pain.    ACTIVITY LIMITATIONS: standing and locomotion level   PARTICIPATION LIMITATIONS: shopping, community activity, and golf   PERSONAL FACTORS: Age, Time since onset of injury/illness/exacerbation, and 3+ comorbidities: see past medical/past surgical history above  are also affecting patient's functional outcome.    REHAB POTENTIAL: Good   CLINICAL DECISION MAKING: Evolving/moderate complexity   EVALUATION COMPLEXITY: Moderate     GOALS: Goals reviewed with patient? Yes   SHORT TERM GOALS: Target date: 07/26/2022     Patient will be independent and compliant with initial HEP.    Baseline: issued at eval  Goal status: met   2.  Patient will be able to tolerate desensitization using a towel for at least 5 minutes to reduce his sensitivity to his bed sheets when  sleeping.  Baseline: does not tolerate light touch to 2nd digits; reports extreme pain when his sheets rub on his 2nd digit  Status: 5-10 minutes of desensitization  Goal status: met   3.  Patient will demonstrate at least 10 degrees of Lt ankle DF AROM to improve gait mechanics.  Baseline: see above  Goal status: met     LONG TERM GOALS: Target date: 08/16/2022       Patient will demonstrate at least 40 degrees of bilateral great toe extension PROM to improve push-off.  Baseline: see above  Goal status: met   2.  Patient will maintain LLE SLS for at least 30 seconds to improve his stability when walking on uneven surfaces.  Baseline: see above  Goal status: not met   3.  Patient will score at least 66% on FOTO to signify clinically meaningful improvement in functional abilities.    Baseline: see above  Goal status: met   4.  Patient will report pain at worst rated as 3/10 to reduce his current functional limitations.  Baseline: 7/10  Status: 7/10 08/05/22 Goal status: not met       PLAN:   PT FREQUENCY: n/a   PT DURATION: n/a   PLANNED INTERVENTIONS: Therapeutic exercises, Therapeutic  activity, Neuromuscular re-education, Balance training, Gait training, Patient/Family education, Self Care, Joint mobilization, Aquatic Therapy, Dry Needling, Cryotherapy, Moist heat, Manual therapy, and Re-evaluation   PLAN FOR NEXT SESSION: n/a  Gwendolyn Grant, PT, DPT, ATC 08/05/22 3:02 PM

## 2022-08-06 ENCOUNTER — Encounter: Payer: Self-pay | Admitting: Family Medicine

## 2022-08-06 ENCOUNTER — Ambulatory Visit (INDEPENDENT_AMBULATORY_CARE_PROVIDER_SITE_OTHER): Payer: Medicare Other | Admitting: Family Medicine

## 2022-08-06 VITALS — BP 110/58 | HR 56 | Temp 97.5°F | Ht 69.0 in | Wt 198.0 lb

## 2022-08-06 DIAGNOSIS — H1011 Acute atopic conjunctivitis, right eye: Secondary | ICD-10-CM

## 2022-08-06 NOTE — Progress Notes (Signed)
Acute Office Visit  Subjective:     Patient ID: [name], male    DOB: 02-22-1946, 77 y.o.   MRN: [id number]  Chief Complaint  Patient presents with   Acute Visit    Possible pink eye     HPI Patient is in today for possible pink eye with itching and red right eye since Sunday. Associated with sneezing and right sided rhinorrhea. He reports mopping the floors and cleaning prior to onset. Denies drainage, crusting, pain, or vision changes.  Overall improving.   Review of Systems  All other systems reviewed and are negative.  Past Medical History:  Diagnosis Date   Allergy    Bacteremia due to Gram-negative bacteria 04/13/2015   Bladder rupture 2010   Bladder tumor    Cancer [facility])    bladder   Chronic kidney disease    Depression    Gout    History of unilateral nephrectomy 07/2014   [facility]   Hyperlipidemia    Restless leg syndrome, controlled    Past Surgical History:  Procedure Laterality Date   APPENDECTOMY     BLADDER SURGERY  2010   COLONOSCOPY  06/05/2009   CYSTOSCOPY W/ RETROGRADES Bilateral 04/21/2013   Procedure: CYSTOSCOPY WITH RETROGRADE PYELOGRAM;  Surgeon: Molli Hazard, MD;  Location: [facility];  Service: Urology;  Laterality: Bilateral;   FRACTURE SURGERY Right 01/05/2005   lower leg/ankle   NASAL SEPTUM SURGERY     TONSILLECTOMY AND ADENOIDECTOMY     TRANSURETHRAL RESECTION OF BLADDER TUMOR WITH GYRUS (TURBT-GYRUS) N/A 04/21/2013   Procedure: TRANSURETHRAL RESECTION OF BLADDER TUMOR WITH GYRUS (TURBT-GYRUS), cystoscopy;  Surgeon: Molli Hazard, MD;  Location: [facility];  Service: Urology;  Laterality: N/A;   Current Outpatient Medications on File Prior to Visit  Medication Sig Dispense Refill   allopurinol (ZYLOPRIM) 300 MG tablet TAKE 1 TABLET BY MOUTH  DAILY 90 tablet 3   Cholecalciferol (VITAMIN D3) 1.25 MG (50000 UT) TABS      clonazePAM (KLONOPIN) 0.5 MG tablet Take 0.5 mg by mouth 2 (two) times daily as needed.     colchicine 0.6  MG tablet Take 0.6-1.2 mg by mouth 2 (two) times daily as needed (for gout flare).      escitalopram (LEXAPRO) 20 MG tablet Take 2 tablets (40 mg total) by mouth at bedtime. 180 tablet 3   ezetimibe (ZETIA) 10 MG tablet TAKE 1 TABLET BY MOUTH IN THE  MORNING 90 tablet 3   fluticasone (FLONASE) 50 MCG/ACT nasal spray USE 2 SPRAYS IN BOTH  NOSTRILS DAILY 48 g 3   gabapentin (NEURONTIN) 300 MG capsule TAKE 1 CAPSULE BY MOUTH 3  TIMES DAILY AS NEEDED 270 capsule 3   omeprazole (PRILOSEC) 20 MG capsule TAKE 1 CAPSULE BY MOUTH DAILY 90 capsule 3   rOPINIRole (REQUIP) 4 MG tablet Take 4 mg by mouth at bedtime.      rosuvastatin (CRESTOR) 5 MG tablet TAKE 1 TABLET BY MOUTH  DAILY 90 tablet 3   traZODone (DESYREL) 100 MG tablet Take 50 mg by mouth daily as needed for sleep.     vitamin B-12 (CYANOCOBALAMIN) 1000 MCG tablet Take 1,000 mcg by mouth daily.     No current facility-administered medications on file prior to visit.   Allergies  Allergen Reactions   Augmentin [Amoxicillin-Pot Clavulanate] Nausea And Vomiting and Other (See Comments)    Has patient had a PCN reaction causing immediate rash, facial/tongue/throat swelling, SOB or lightheadedness with hypotension: No Has patient  had a PCN reaction causing severe rash involving mucus membranes or skin necrosis: No Has patient had a PCN reaction that required hospitalization No Has patient had a PCN reaction occurring within the last 10 years: No If all of the above answers are "NO", then may proceed with Cephalosporin use.   Statins Other (See Comments)    Reaction:  Muscle pain    Nystatin         Objective:    BP (!) 110/58   Pulse (!) 56   Temp (!) 97.5 F (36.4 C) (Oral)   Ht '5\' 9"'$  (1.753 m)   Wt 198 lb (89.8 kg)   SpO2 98%   BMI 29.24 kg/m    Physical Exam Vitals and nursing note reviewed.  Constitutional:      Appearance: Normal appearance. He is normal weight.  HENT:     Head: Normocephalic and atraumatic.  Eyes:      General:        Right eye: No discharge.        Left eye: No discharge.     Conjunctiva/sclera: Conjunctivae normal.     Right eye: Right conjunctiva is not injected. No exudate.    Left eye: Left conjunctiva is not injected. No exudate. Skin:    General: Skin is warm and dry.     Capillary Refill: Capillary refill takes less than 2 seconds.  Neurological:     General: No focal deficit present.     Mental Status: He is alert and oriented to person, place, and time. Mental status is at baseline.  Psychiatric:        Mood and Affect: Mood normal.        Behavior: Behavior normal.        Thought Content: Thought content normal.        Judgment: Judgment normal.     No results found for any visits on 08/06/22.      Assessment & Plan:   Problem List Items Addressed This Visit       Other   Allergic conjunctivitis of right eye - Primary    Patients symptoms consistent with allergic conjunctivitis that is overall improving. I encouraged him to resume his daily allergy medication and avoid allergens. If symptoms return or worsen he will notify office and may need antibiotic drops. May also use cold compress, eye drops, and humidification.       No orders of the defined types were placed in this encounter.   Return if symptoms worsen or fail to improve.  Rubie Maid, FNP

## 2022-08-06 NOTE — Assessment & Plan Note (Signed)
Patients symptoms consistent with allergic conjunctivitis that is overall improving. I encouraged him to resume his daily allergy medication and avoid allergens. If symptoms return or worsen he will notify office and may need antibiotic drops. May also use cold compress, eye drops, and humidification.

## 2022-08-07 ENCOUNTER — Ambulatory Visit: Payer: Medicare Other | Admitting: Physical Therapy

## 2022-08-14 ENCOUNTER — Ambulatory Visit: Payer: Medicare Other | Admitting: Physical Therapy

## 2022-08-21 ENCOUNTER — Ambulatory Visit: Payer: Medicare Other | Admitting: Physical Therapy

## 2022-09-20 ENCOUNTER — Encounter: Payer: Self-pay | Admitting: Family Medicine

## 2022-09-20 ENCOUNTER — Ambulatory Visit (INDEPENDENT_AMBULATORY_CARE_PROVIDER_SITE_OTHER): Payer: Medicare Other | Admitting: Family Medicine

## 2022-09-20 VITALS — BP 124/80 | HR 60 | Temp 97.6°F | Ht 69.0 in | Wt 199.0 lb

## 2022-09-20 DIAGNOSIS — R319 Hematuria, unspecified: Secondary | ICD-10-CM

## 2022-09-20 DIAGNOSIS — N390 Urinary tract infection, site not specified: Secondary | ICD-10-CM

## 2022-09-20 LAB — URINALYSIS, ROUTINE W REFLEX MICROSCOPIC
Bilirubin Urine: NEGATIVE
Casts: NONE SEEN /LPF
Crystals: NONE SEEN /HPF
Glucose, UA: NEGATIVE
Hyaline Cast: NONE SEEN /LPF
Ketones, ur: NEGATIVE
Nitrite: POSITIVE — AB
Specific Gravity, Urine: 1.016 (ref 1.001–1.035)
Yeast: NONE SEEN /HPF
pH: 7 (ref 5.0–8.0)

## 2022-09-20 LAB — MICROSCOPIC MESSAGE

## 2022-09-20 MED ORDER — CEPHALEXIN 500 MG PO CAPS: 500.0000 mg | ORAL_CAPSULE | Freq: Three times a day (TID) | ORAL | 0 refills | Status: DC

## 2022-09-20 NOTE — Progress Notes (Signed)
Subjective:    Patient ID: [name], male    DOB: 05-11-1946, 77 y.o.   MRN: [id number]  Patient presents today concerned that he has a urinary tract infection.  He has a history of bladder cancer and has an ileal conduit with a cystostomy bag.  He reports that for the last few days, his urine has had a foul odor.  He also reports low back pain.  Urinalysis is obviously contaminated as it came from his back however the urine is cloudy.  He has +1 blood, positive nitrates, and +1 leukocyte esterase.  Urine culture is being sent.  He denies nausea or vomiting or fever Past Medical History:  Diagnosis Date   Allergy    Bacteremia due to Gram-negative bacteria 04/13/2015   Bladder rupture 2010   Bladder tumor    Cancer [facility])    bladder   Chronic kidney disease    Depression    Gout    History of unilateral nephrectomy 07/2014   [facility]   Hyperlipidemia    Restless leg syndrome, controlled    Past Surgical History:  Procedure Laterality Date   APPENDECTOMY     BLADDER SURGERY  2010   COLONOSCOPY  06/05/2009   CYSTOSCOPY W/ RETROGRADES Bilateral 04/21/2013   Procedure: CYSTOSCOPY WITH RETROGRADE PYELOGRAM;  Surgeon: Molli Hazard, MD;  Location: [facility];  Service: Urology;  Laterality: Bilateral;   FRACTURE SURGERY Right 01/05/2005   lower leg/ankle   NASAL SEPTUM SURGERY     TONSILLECTOMY AND ADENOIDECTOMY     TRANSURETHRAL RESECTION OF BLADDER TUMOR WITH GYRUS (TURBT-GYRUS) N/A 04/21/2013   Procedure: TRANSURETHRAL RESECTION OF BLADDER TUMOR WITH GYRUS (TURBT-GYRUS), cystoscopy;  Surgeon: Molli Hazard, MD;  Location: [facility];  Service: Urology;  Laterality: N/A;   Current Outpatient Medications on File Prior to Visit  Medication Sig Dispense Refill   allopurinol (ZYLOPRIM) 300 MG tablet TAKE 1 TABLET BY MOUTH  DAILY 90 tablet 3   Cholecalciferol (VITAMIN D3) 1.25 MG (50000 UT) TABS      clonazePAM (KLONOPIN) 0.5 MG tablet Take 0.5 mg by mouth 2 (two) times daily as  needed.     colchicine 0.6 MG tablet Take 0.6-1.2 mg by mouth 2 (two) times daily as needed (for gout flare).      escitalopram (LEXAPRO) 20 MG tablet Take 2 tablets (40 mg total) by mouth at bedtime. 180 tablet 3   ezetimibe (ZETIA) 10 MG tablet TAKE 1 TABLET BY MOUTH IN THE  MORNING 90 tablet 3   fluticasone (FLONASE) 50 MCG/ACT nasal spray USE 2 SPRAYS IN BOTH  NOSTRILS DAILY 48 g 3   gabapentin (NEURONTIN) 300 MG capsule TAKE 1 CAPSULE BY MOUTH 3  TIMES DAILY AS NEEDED 270 capsule 3   omeprazole (PRILOSEC) 20 MG capsule TAKE 1 CAPSULE BY MOUTH DAILY 90 capsule 3   rOPINIRole (REQUIP) 4 MG tablet Take 4 mg by mouth at bedtime.      rosuvastatin (CRESTOR) 5 MG tablet TAKE 1 TABLET BY MOUTH  DAILY 90 tablet 3   traZODone (DESYREL) 100 MG tablet Take 50 mg by mouth daily as needed for sleep.     vitamin B-12 (CYANOCOBALAMIN) 1000 MCG tablet Take 1,000 mcg by mouth daily.     No current facility-administered medications on file prior to visit.   Allergies  Allergen Reactions   Augmentin [Amoxicillin-Pot Clavulanate] Nausea And Vomiting and Other (See Comments)    Has patient had a PCN reaction causing immediate rash, facial/tongue/throat  swelling, SOB or lightheadedness with hypotension: No Has patient had a PCN reaction causing severe rash involving mucus membranes or skin necrosis: No Has patient had a PCN reaction that required hospitalization No Has patient had a PCN reaction occurring within the last 10 years: No If all of the above answers are "NO", then may proceed with Cephalosporin use.   Statins Other (See Comments)    Reaction:  Muscle pain    Nystatin    Social History   Socioeconomic History   Marital status: Married    Spouse name: Velva Harman   Number of children: 2   Years of education: Not on file   Highest education level: Not on file  Occupational History   Not on file  Tobacco Use   Smoking status: Former    Packs/day: 0.50    Years: 30.00    Additional pack years:  0.00    Total pack years: 15.00    Types: Cigarettes    Quit date: 07/24/1989    Years since quitting: 33.1   Smokeless tobacco: Never  Vaping Use   Vaping Use: Never used  Substance and Sexual Activity   Alcohol use: Yes    Comment: social   Drug use: No   Sexual activity: Not on file  Other Topics Concern   Not on file  Social History Narrative   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   1 son died in 10/24/2016.   1 son living.   1 granddaughter.   Social Determinants of Health   Financial Resource Strain: Low Risk  (06/21/2021)   Overall Financial Resource Strain (CARDIA)    Difficulty of Paying Living Expenses: Not hard at all  Food Insecurity: No Food Insecurity (06/21/2021)   Hunger Vital Sign    Worried About Running Out of Food in the Last Year: Never true    Ran Out of Food in the Last Year: Never true  Transportation Needs: No Transportation Needs (06/21/2021)   PRAPARE - Hydrologist (Medical): No    Lack of Transportation (Non-Medical): No  Physical Activity: Insufficiently Active (06/21/2021)   Exercise Vital Sign    Days of Exercise per Week: 3 days    Minutes of Exercise per Session: 30 min  Stress: No Stress Concern Present (06/21/2021)   [place]    Feeling of Stress : Only a little  Social Connections: Socially Integrated (06/21/2021)   Social Connection and Isolation Panel [NHANES]    Frequency of Communication with Friends and Family: More than three times a week    Frequency of Social Gatherings with Friends and Family: More than three times a week    Attends Religious Services: More than 4 times per year    Active Member of [employer] or Organizations: Yes    Attends Music therapist: More than 4 times per year    Marital Status: Married  Human resources officer Violence: Not At Risk (06/21/2021)   Humiliation, Afraid, Rape, and Kick questionnaire     Fear of Current or Ex-Partner: No    Emotionally Abused: No    Physically Abused: No    Sexually Abused: No   Family History  Problem Relation Age of Onset   Cancer Mother        ovarian   Cirrhosis Father    Alcohol abuse Father    Colon cancer Neg Hx    Colon polyps Neg Hx  Esophageal cancer Neg Hx    Rectal cancer Neg Hx    Stomach cancer Neg Hx      Review of Systems  Musculoskeletal:  Positive for back pain.  All other systems reviewed and are negative.      Objective:   Physical Exam Vitals reviewed.  Constitutional:      General: He is not in acute distress.    Appearance: He is well-developed. He is not diaphoretic.  HENT:     Head: Normocephalic and atraumatic.     Mouth/Throat:     Mouth: No oral lesions.  Neck:     Thyroid: No thyromegaly.     Vascular: No JVD.     Trachea: No tracheal deviation.  Cardiovascular:     Rate and Rhythm: Normal rate and regular rhythm.     Heart sounds: Normal heart sounds. No murmur heard.    No friction rub. No gallop.  Pulmonary:     Effort: Pulmonary effort is normal. No respiratory distress.     Breath sounds: No stridor. No wheezing, rhonchi or rales.  Chest:     Chest wall: No tenderness.  Skin:    General: Skin is warm.     Findings: No erythema or rash.  Neurological:     Mental Status: He is alert.     Motor: No abnormal muscle tone.     Deep Tendon Reflexes: Reflexes are normal and symmetric.   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        Assessment & Plan:  Urinary tract infection with hematuria, site unspecified - Plan: Urinalysis, Routine w reflex microscopic  Send urine culture.  Meanwhile start Keflex 500 mg 3 times daily for 7 days

## 2022-09-22 LAB — URINE CULTURE
MICRO NUMBER:: 14699981
SPECIMEN QUALITY:: ADEQUATE

## 2022-10-09 ENCOUNTER — Ambulatory Visit (HOSPITAL_COMMUNITY)
Admission: RE | Admit: 2022-10-09 | Discharge: 2022-10-09 | Disposition: A | Discharge status: Home / Self Care | Payer: Medicare Other | Source: Ambulatory Visit | Attending: Urology | Admitting: Urology

## 2022-10-09 ENCOUNTER — Other Ambulatory Visit (HOSPITAL_COMMUNITY): Payer: Self-pay | Admitting: Urology

## 2022-10-09 DIAGNOSIS — C61 Malignant neoplasm of prostate: Secondary | ICD-10-CM | POA: Insufficient documentation

## 2023-01-25 ENCOUNTER — Other Ambulatory Visit: Payer: Self-pay | Admitting: Family Medicine

## 2023-01-27 NOTE — Telephone Encounter (Signed)
Requested Prescriptions  Pending Prescriptions Disp Refills   allopurinol (ZYLOPRIM) 300 MG tablet [Pharmacy Med Name: Allopurinol 300 MG Oral Tablet] 90 tablet 1    Sig: TAKE 1 TABLET BY MOUTH DAILY     Endocrinology:  Gout Agents - allopurinol Failed - 01/25/2023 10:09 PM      Failed - Uric Acid in normal range and within 360 days    Uric Acid, Serum  Date Value Ref Range Status  06/16/2014 10.2 (H) 4.0 - 7.8 mg/dL Final         Failed - Cr in normal range and within 360 days    Creatinine  Date Value Ref Range Status  12/26/2015 2.3 (H) 0.7 - 1.3 mg/dL Final   Creat  Date Value Ref Range Status  06/14/2022 1.71 (H) 0.70 - 1.28 mg/dL Final         Failed - Valid encounter within last 12 months    Recent Outpatient Visits           1 year ago Acute bacterial conjunctivitis of right eye   [facility] Donita Brooks, MD   1 year ago Bronchitis   [facility] Donita Brooks, MD   1 year ago Upper airway cough syndrome   [facility] [name], Priscille Heidelberg, MD   1 year ago Prediabetes   [facility] Donita Brooks, MD   2 years ago History of prostate cancer   [facility] Donita Brooks, MD       Future Appointments             In 4 months [name], Priscille Heidelberg, MD [facility]            Passed - CBC within normal limits and completed in the last 12 months    WBC  Date Value Ref Range Status  06/14/2022 5.6 3.8 - 10.8 Thousand/uL Final   RBC  Date Value Ref Range Status  06/14/2022 4.75 4.20 - 5.80 Million/uL Final   Hemoglobin  Date Value Ref Range Status  06/14/2022 14.5 13.2 - 17.1 g/dL Final  47/82/9562 13.0 13.0 - 17.7 g/dL Final   HGB  Date Value Ref Range Status  12/26/2015 13.4 13.0 - 17.1 g/dL Final   HCT  Date Value Ref Range Status  06/14/2022 43.9 38.5 - 50.0 % Final  12/26/2015 40.9 38.4 - 49.9 % Final   Hematocrit   Date Value Ref Range Status  12/03/2019 38.0 37.5 - 51.0 % Final   MCHC  Date Value Ref Range Status  06/14/2022 33.0 32.0 - 36.0 g/dL Final   [facility]  Date Value Ref Range Status  06/14/2022 30.5 27.0 - 33.0 pg Final   MCV  Date Value Ref Range Status  06/14/2022 92.4 80.0 - 100.0 fL Final  12/03/2019 91 79 - 97 fL Final  12/26/2015 90.5 79.3 - 98.0 fL Final   No results found for: "PLTCOUNTKUC", "LABPLAT", "POCPLA" RDW  Date Value Ref Range Status  06/14/2022 13.0 11.0 - 15.0 % Final  12/03/2019 13.3 11.6 - 15.4 % Final  12/26/2015 14.0 11.0 - 14.6 % Final          rosuvastatin (CRESTOR) 5 MG tablet [Pharmacy Med Name: Rosuvastatin Calcium 5 MG Oral Tablet] 90 tablet 1    Sig: TAKE 1 TABLET BY MOUTH DAILY     Cardiovascular:  Antilipid - Statins 2 Failed - 01/25/2023 10:09 PM  Failed - Cr in normal range and within 360 days    Creatinine  Date Value Ref Range Status  12/26/2015 2.3 (H) 0.7 - 1.3 mg/dL Final   Creat  Date Value Ref Range Status  06/14/2022 1.71 (H) 0.70 - 1.28 mg/dL Final         Failed - Valid encounter within last 12 months    Recent Outpatient Visits           1 year ago Acute bacterial conjunctivitis of right eye   [facility] [name], Priscille Heidelberg, MD   1 year ago Bronchitis   [facility] Tanya Nones, Priscille Heidelberg, MD   1 year ago Upper airway cough syndrome   [facility] [name], Priscille Heidelberg, MD   1 year ago Prediabetes   [facility] Tanya Nones, Priscille Heidelberg, MD   2 years ago History of prostate cancer   [facility] [name], Priscille Heidelberg, MD       Future Appointments             In 4 months [name], Priscille Heidelberg, MD [facility]            Failed - Lipid Panel in normal range within the last 12 months    Cholesterol  Date Value Ref Range Status  06/14/2022 98 <200 mg/dL Final   LDL Cholesterol (Calc)  Date Value Ref Range Status   06/14/2022 36 mg/dL (calc) Final    Comment:    Reference range: <100 . Desirable range <100 mg/dL for primary prevention;   <70 mg/dL for patients with CHD or diabetic patients  with > or = 2 CHD risk factors. [name] LDL-C is now calculated using the Martin-Hopkins  calculation, which is a validated novel method providing  better accuracy than the Friedewald equation in the  estimation of LDL-C.  Horald Pollen et al. Lenox Ahr. 9604;540(98): 2061-2068  (http://education.[facility].com/faq/FAQ164)    HDL  Date Value Ref Range Status  06/14/2022 40 > OR = 40 mg/dL Final   Triglycerides  Date Value Ref Range Status  06/14/2022 138 <150 mg/dL Final         Passed - Patient is not pregnant       ezetimibe (ZETIA) 10 MG tablet [Pharmacy Med Name: Ezetimibe 10 MG Oral Tablet] 90 tablet 1    Sig: TAKE 1 TABLET BY MOUTH IN THE  MORNING     Cardiovascular:  Antilipid - Sterol Transport Inhibitors Failed - 01/25/2023 10:09 PM      Failed - Valid encounter within last 12 months    Recent Outpatient Visits           1 year ago Acute bacterial conjunctivitis of right eye   [facility] Donita Brooks, MD   1 year ago Bronchitis   [facility] Tanya Nones, Priscille Heidelberg, MD   1 year ago Upper airway cough syndrome   [facility] Tanya Nones, Priscille Heidelberg, MD   1 year ago Prediabetes   [facility] Donita Brooks, MD   2 years ago History of prostate cancer   [facility] [name], Priscille Heidelberg, MD       Future Appointments             In 4 months [name], Priscille Heidelberg, MD [facility]            Failed -  Lipid Panel in normal range within the last 12 months    Cholesterol  Date Value Ref Range Status  06/14/2022 98 <200 mg/dL Final   LDL Cholesterol (Calc)  Date Value Ref Range Status  06/14/2022 36 mg/dL (calc) Final    Comment:    Reference range: <100 . Desirable range  <100 mg/dL for primary prevention;   <70 mg/dL for patients with CHD or diabetic patients  with > or = 2 CHD risk factors. [name] LDL-C is now calculated using the Martin-Hopkins  calculation, which is a validated novel method providing  better accuracy than the Friedewald equation in the  estimation of LDL-C.  Horald Pollen et al. Lenox Ahr. 1610;960(45): 2061-2068  (http://education.[facility].com/faq/FAQ164)    HDL  Date Value Ref Range Status  06/14/2022 40 > OR = 40 mg/dL Final   Triglycerides  Date Value Ref Range Status  06/14/2022 138 <150 mg/dL Final         Passed - AST in normal range and within 360 days    AST  Date Value Ref Range Status  06/14/2022 17 10 - 35 U/L Final  12/26/2015 16 5 - 34 U/L Final         Passed - ALT in normal range and within 360 days    ALT  Date Value Ref Range Status  06/14/2022 10 9 - 46 U/L Final  12/26/2015 17 0 - 55 U/L Final         Passed - Patient is not pregnant

## 2023-04-20 ENCOUNTER — Other Ambulatory Visit: Payer: Self-pay | Admitting: Family Medicine

## 2023-04-21 NOTE — Telephone Encounter (Signed)
Requested by interface surescripts. Last OV 09/20/22. Future visit in 2 months.  Requested Prescriptions  Pending Prescriptions Disp Refills   omeprazole (PRILOSEC) 20 MG capsule [Pharmacy Med Name: Omeprazole 20 MG Oral Capsule Delayed Release] 90 capsule 0    Sig: TAKE 1 CAPSULE BY MOUTH DAILY     Gastroenterology: Proton Pump Inhibitors Failed - 04/20/2023 10:13 PM      Failed - Valid encounter within last 12 months    Recent Outpatient Visits           1 year ago Acute bacterial conjunctivitis of right eye   [facility] Donita Brooks, MD   1 year ago Bronchitis   [facility] Tanya Nones, Priscille Heidelberg, MD   1 year ago Upper airway cough syndrome   [facility] Donita Brooks, MD   2 years ago Prediabetes   [facility] Donita Brooks, MD   2 years ago History of prostate cancer   [facility] [name], Priscille Heidelberg, MD       Future Appointments             In 2 months [name], Priscille Heidelberg, MD [facility]

## 2023-05-08 ENCOUNTER — Other Ambulatory Visit: Payer: Self-pay | Admitting: Medical Genetics

## 2023-05-08 DIAGNOSIS — Z006 Encounter for examination for normal comparison and control in clinical research program: Secondary | ICD-10-CM

## 2023-05-22 ENCOUNTER — Ambulatory Visit: Payer: Medicare Other

## 2023-05-22 DIAGNOSIS — Z23 Encounter for immunization: Secondary | ICD-10-CM

## 2023-05-22 NOTE — Progress Notes (Signed)
Here for flu vaccine. Given as per standing order. Pt tolerated well. Mjp,lpn

## 2023-05-28 ENCOUNTER — Encounter (INDEPENDENT_AMBULATORY_CARE_PROVIDER_SITE_OTHER): Payer: Self-pay

## 2023-05-28 ENCOUNTER — Ambulatory Visit (INDEPENDENT_AMBULATORY_CARE_PROVIDER_SITE_OTHER): Payer: Medicare Other | Admitting: Otolaryngology

## 2023-05-28 ENCOUNTER — Ambulatory Visit (INDEPENDENT_AMBULATORY_CARE_PROVIDER_SITE_OTHER): Payer: Medicare Other | Admitting: Audiology

## 2023-05-28 VITALS — Ht 69.0 in | Wt 192.0 lb

## 2023-05-28 DIAGNOSIS — H9313 Tinnitus, bilateral: Secondary | ICD-10-CM | POA: Diagnosis not present

## 2023-05-28 DIAGNOSIS — R490 Dysphonia: Secondary | ICD-10-CM | POA: Insufficient documentation

## 2023-05-28 DIAGNOSIS — H903 Sensorineural hearing loss, bilateral: Secondary | ICD-10-CM | POA: Diagnosis not present

## 2023-05-28 NOTE — Progress Notes (Addendum)
Patient ID: [name], male   DOB: May 22, 1946, 77 y.o.   MRN: [id number]  Follow-up: Bilateral tinnitus, bilateral hearing loss  HPI: The patient is a 77 year old male who returns today for his follow-up evaluation.  He was last seen 1 year ago.  At that time, he was complaining of bilateral tinnitus.  His tinnitus was high-pitched and nonpulsatile.  He was noted to have bilateral high-frequency sensorineural hearing loss.  The strategies to cope with tinnitus were discussed with the patient.  The patient returns today reporting improvement in his tinnitus.  The severity and frequency of the tinnitus have decreased.  He denies any recent change in his hearing.  He has a new complaint of intermittent dysphonia.  He is interested in having a laryngology evaluation.  Exam: General: Communicates without difficulty, well nourished, no acute distress. Head: Normocephalic, no evidence injury, no tenderness, facial buttresses intact without stepoff. Face/sinus: No tenderness to palpation and percussion. Facial movement is normal and symmetric. Eyes: PERRL, EOMI. No scleral icterus, conjunctivae clear. Neuro: CN II exam reveals vision grossly intact.  No nystagmus at any point of gaze. Ears: Auricles well formed without lesions.  Ear canals are intact without mass or lesion.  No erythema or edema is appreciated.  The TMs are intact without fluid. Nose: External evaluation reveals normal support and skin without lesions.  Dorsum is intact.  Anterior rhinoscopy reveals pink mucosa over anterior aspect of inferior turbinates and intact septum.  No purulence noted. Oral:  Oral cavity and oropharynx are intact, symmetric, without erythema or edema.  Mucosa is moist without lesions. Neck: Full range of motion without pain.  There is no significant lymphadenopathy.  No masses palpable.  Thyroid bed within normal limits to palpation.  Parotid glands and submandibular glands equal bilaterally without mass.  Trachea is  midline. Neuro:  CN 2-12 grossly intact. Gait normal.   His hearing test shows stable bilateral high-frequency sensorineural hearing loss.  Assessment: 1.  Stable bilateral high-frequency sensorineural hearing loss. 2.  The patient's tinnitus is likely a result of his hearing loss.  The severity of his tinnitus has decreased. 3.  His ear canals, tympanic membranes, and middle ear spaces are all normal.  Plan: 1.  The physical exam findings and the hearing test results are reviewed with the patient. 2.  The strategies to cope with tinnitus are again discussed. 3.  Referral to Dr. Ashok Croon for evaluation of his hoarseness.

## 2023-05-28 NOTE — Progress Notes (Signed)
  [address] [place], [place] [address] [phone]  Audiological Evaluation    Name: [name]     DOB:   May 13, 1946      MRN:   [id number]                                                                                     Service Date: 05/28/2023        Patient was referred today for a hearing evaluation by Dr. Karle Barr.   Symptoms Yes Details  Hearing loss  []  Patient denied perceiving any hearing loss.  Tinnitus  [x]  Patient reported experiencing intermittent tinnitus.  Balance problems  []  Patient denied vertigo sensations.  Previous ear surgeries  []  Patient denied any previous ear surgeries.  Family history  []  Patient denied family history of hearing loss.  Amplification  []  Patient denied the use of hearing aids.    Tympanogram: Right ear: Normal external ear canal volume with normal middle ear pressure and tympanic membrane compliance (Type A). Left ear: Normal external ear canal volume with normal middle ear pressure and tympanic membrane compliance (Type A).    Hearing Evaluation: The audiogram was completed using conventional audiometric techniques under headphones with good reliability.   The hearing test results indicate: Right ear: Normal hearing sensitivity from [phone] Hz sloping to moderate sensorineural hearing loss from 3000-8000 Hz. Left ear: Normal hearing sensitivity from [phone] Hz sloping to moderate sensorineural hearing loss from 3000-8000 Hz.  Speech Audiometry: Right ear- Speech Reception Threshold (SRT) was obtained at 25 dBHL. Left ear- Speech Reception Threshold (SRT) was obtained at 25 dBHL.   Word Recognition Score Tested using NU-6 (MLV) Right ear: 92% was obtained at a presentation level of 65 dBHL which is deemed as good understanding. Left ear: 92% was obtained at a presentation level of 65 dBHL which is deemed as good understanding.    Impression:  There is not a significant difference between puretone  thresholds and word recognition scores between ears.   Recommendations: Repeat audiogram when changes are perceived or per MD. Consider various tinnitus strategies, including the use of a noise generator, hearing aids, or tinnitus retraining therapy.   Conley Rolls Sheza Strickland, AUD, CCC-A 05/28/23

## 2023-05-30 NOTE — Addendum Note (Signed)
Addended byNewman Pies on: 05/30/2023 09:20 AM   Modules accepted: Orders

## 2023-06-10 ENCOUNTER — Other Ambulatory Visit (HOSPITAL_COMMUNITY)
Admission: RE | Admit: 2023-06-10 | Discharge: 2023-06-10 | Disposition: A | Discharge status: Home / Self Care | Payer: Self-pay | Source: Ambulatory Visit | Attending: Oncology | Admitting: Oncology

## 2023-06-10 DIAGNOSIS — Z006 Encounter for examination for normal comparison and control in clinical research program: Secondary | ICD-10-CM | POA: Insufficient documentation

## 2023-06-16 ENCOUNTER — Other Ambulatory Visit: Payer: Medicare Other

## 2023-06-16 DIAGNOSIS — Z905 Acquired absence of kidney: Secondary | ICD-10-CM

## 2023-06-16 DIAGNOSIS — C679 Malignant neoplasm of bladder, unspecified: Secondary | ICD-10-CM

## 2023-06-16 DIAGNOSIS — E871 Hypo-osmolality and hyponatremia: Secondary | ICD-10-CM

## 2023-06-16 DIAGNOSIS — D631 Anemia in chronic kidney disease: Secondary | ICD-10-CM

## 2023-06-16 DIAGNOSIS — N390 Urinary tract infection, site not specified: Secondary | ICD-10-CM

## 2023-06-16 DIAGNOSIS — Z125 Encounter for screening for malignant neoplasm of prostate: Secondary | ICD-10-CM

## 2023-06-16 DIAGNOSIS — R7303 Prediabetes: Secondary | ICD-10-CM

## 2023-06-16 DIAGNOSIS — N1831 Chronic kidney disease, stage 3a: Secondary | ICD-10-CM

## 2023-06-16 DIAGNOSIS — E785 Hyperlipidemia, unspecified: Secondary | ICD-10-CM

## 2023-06-17 ENCOUNTER — Other Ambulatory Visit: Payer: Self-pay

## 2023-06-17 DIAGNOSIS — R300 Dysuria: Secondary | ICD-10-CM

## 2023-06-17 DIAGNOSIS — N390 Urinary tract infection, site not specified: Secondary | ICD-10-CM

## 2023-06-17 LAB — URINALYSIS, ROUTINE W REFLEX MICROSCOPIC
Bilirubin Urine: NEGATIVE
Glucose, UA: NEGATIVE
Hyaline Cast: NONE SEEN /[LPF]
Ketones, ur: NEGATIVE
Nitrite: POSITIVE — AB
Specific Gravity, Urine: 1.014 (ref 1.001–1.035)
Squamous Epithelial / HPF: NONE SEEN /[HPF] (ref <=5)
WBC, UA: >=60 /[HPF] — AB (ref 0–5)
pH: 6 (ref 5.0–8.0)

## 2023-06-17 LAB — MICROSCOPIC MESSAGE

## 2023-06-17 MED ORDER — SULFAMETHOXAZOLE-TRIMETHOPRIM 800-160 MG PO TABS: 1.0000 | ORAL_TABLET | Freq: Two times a day (BID) | ORAL | 0 refills | Status: DC

## 2023-06-18 ENCOUNTER — Other Ambulatory Visit: Payer: Medicare Other

## 2023-06-18 ENCOUNTER — Other Ambulatory Visit: Payer: Self-pay | Admitting: Family Medicine

## 2023-06-18 DIAGNOSIS — R7303 Prediabetes: Secondary | ICD-10-CM

## 2023-06-18 DIAGNOSIS — E785 Hyperlipidemia, unspecified: Secondary | ICD-10-CM

## 2023-06-18 DIAGNOSIS — Z905 Acquired absence of kidney: Secondary | ICD-10-CM

## 2023-06-18 DIAGNOSIS — C679 Malignant neoplasm of bladder, unspecified: Secondary | ICD-10-CM

## 2023-06-18 DIAGNOSIS — E871 Hypo-osmolality and hyponatremia: Secondary | ICD-10-CM

## 2023-06-18 DIAGNOSIS — N1831 Chronic kidney disease, stage 3a: Secondary | ICD-10-CM

## 2023-06-18 DIAGNOSIS — D631 Anemia in chronic kidney disease: Secondary | ICD-10-CM

## 2023-06-18 DIAGNOSIS — Z125 Encounter for screening for malignant neoplasm of prostate: Secondary | ICD-10-CM

## 2023-06-18 LAB — URINE CULTURE
MICRO NUMBER:: 15825405
SPECIMEN QUALITY:: ADEQUATE

## 2023-06-19 ENCOUNTER — Other Ambulatory Visit: Payer: Self-pay | Admitting: Family Medicine

## 2023-06-19 ENCOUNTER — Ambulatory Visit: Payer: Medicare Other | Admitting: Family Medicine

## 2023-06-19 LAB — HEMOGLOBIN A1C
Hgb A1c MFr Bld: 6.4 %{Hb} — ABNORMAL HIGH (ref <5.7)
Mean Plasma Glucose: 137 mg/dL
eAG (mmol/L): 7.6 mmol/L

## 2023-06-19 LAB — CBC WITH DIFFERENTIAL/PLATELET
Absolute Lymphocytes: 1320 {cells}/uL (ref 850–3900)
Absolute Monocytes: 759 {cells}/uL (ref 200–950)
Basophils Absolute: 20 {cells}/uL (ref 0–200)
Basophils Relative: 0.3 %
Eosinophils Absolute: 59 {cells}/uL (ref 15–500)
Eosinophils Relative: 0.9 %
HCT: 40.5 % (ref 38.5–50.0)
Hemoglobin: 13.2 g/dL (ref 13.2–17.1)
MCH: 30.2 pg (ref 27.0–33.0)
MCHC: 32.6 g/dL (ref 32.0–36.0)
MCV: 92.7 fL (ref 80.0–100.0)
MPV: 9.9 fL (ref 7.5–12.5)
Monocytes Relative: 11.5 %
Neutro Abs: 4442 {cells}/uL (ref 1500–7800)
Neutrophils Relative %: 67.3 %
Platelets: 184 10*3/uL (ref 140–400)
RBC: 4.37 10*6/uL (ref 4.20–5.80)
RDW: 13.3 % (ref 11.0–15.0)
Total Lymphocyte: 20 %
WBC: 6.6 10*3/uL (ref 3.8–10.8)

## 2023-06-19 LAB — COMPLETE METABOLIC PANEL WITH GFR
AG Ratio: 1.3 (calc) (ref 1.0–2.5)
ALT: 21 U/L (ref 9–46)
AST: 21 U/L (ref 10–35)
Albumin: 3.9 g/dL (ref 3.6–5.1)
Alkaline phosphatase (APISO): 94 U/L (ref 35–144)
BUN/Creatinine Ratio: 14 (calc) (ref 6–22)
BUN: 34 mg/dL — ABNORMAL HIGH (ref 7–25)
CO2: 20 mmol/L (ref 20–32)
Calcium: 8.9 mg/dL (ref 8.6–10.3)
Chloride: 105 mmol/L (ref 98–110)
Creat: 2.46 mg/dL — ABNORMAL HIGH (ref 0.70–1.28)
Globulin: 3 g/dL (ref 1.9–3.7)
Glucose, Bld: 107 mg/dL — ABNORMAL HIGH (ref 65–99)
Potassium: 4.5 mmol/L (ref 3.5–5.3)
Sodium: 138 mmol/L (ref 135–146)
Total Bilirubin: 0.4 mg/dL (ref 0.2–1.2)
Total Protein: 6.9 g/dL (ref 6.1–8.1)
eGFR: 26 mL/min/{1.73_m2} — ABNORMAL LOW (ref >=60)

## 2023-06-19 LAB — LIPID PANEL
Cholesterol: 98 mg/dL (ref <200)
HDL: 44 mg/dL (ref >=40)
LDL Cholesterol (Calc): 35 mg/dL
Non-HDL Cholesterol (Calc): 54 mg/dL (ref <130)
Total CHOL/HDL Ratio: 2.2 (calc) (ref <5.0)
Triglycerides: 111 mg/dL (ref <150)

## 2023-06-19 LAB — PSA: PSA: 0.04 ng/mL (ref <=4.00)

## 2023-06-19 MED ORDER — CIPROFLOXACIN HCL 500 MG PO TABS: 500.0000 mg | ORAL_TABLET | Freq: Two times a day (BID) | ORAL | 0 refills | Status: AC

## 2023-06-20 ENCOUNTER — Other Ambulatory Visit: Payer: Self-pay | Admitting: Family Medicine

## 2023-06-20 LAB — GENECONNECT MOLECULAR SCREEN: Genetic Analysis Overall Interpretation: NEGATIVE

## 2023-06-23 ENCOUNTER — Ambulatory Visit (INDEPENDENT_AMBULATORY_CARE_PROVIDER_SITE_OTHER): Payer: Medicare Other | Admitting: Family Medicine

## 2023-06-23 ENCOUNTER — Encounter: Payer: Self-pay | Admitting: Family Medicine

## 2023-06-23 VITALS — BP 122/68 | HR 64 | Temp 98.0°F | Ht 69.0 in | Wt 196.2 lb

## 2023-06-23 DIAGNOSIS — Z0001 Encounter for general adult medical examination with abnormal findings: Secondary | ICD-10-CM | POA: Diagnosis not present

## 2023-06-23 DIAGNOSIS — E785 Hyperlipidemia, unspecified: Secondary | ICD-10-CM

## 2023-06-23 DIAGNOSIS — Z0000 Encounter for general adult medical examination without abnormal findings: Secondary | ICD-10-CM

## 2023-06-23 DIAGNOSIS — R7303 Prediabetes: Secondary | ICD-10-CM | POA: Diagnosis not present

## 2023-06-23 DIAGNOSIS — Z23 Encounter for immunization: Secondary | ICD-10-CM

## 2023-06-23 DIAGNOSIS — N184 Chronic kidney disease, stage 4 (severe): Secondary | ICD-10-CM | POA: Diagnosis not present

## 2023-06-23 NOTE — Telephone Encounter (Signed)
Requested medication (s) are due for refill today: Due 07/22/23  Requested medication (s) are on the active medication list: yes    Last refill: 04/21/23  #90  0 refills  Future visit scheduled Pt has appt today  Notes to clinic:Appt today 06/23/23, please review. Thank you.  Requested Prescriptions  Pending Prescriptions Disp Refills   omeprazole (PRILOSEC) 20 MG capsule [Pharmacy Med Name: Omeprazole 20 MG Oral Capsule Delayed Release] 90 capsule 3    Sig: TAKE 1 CAPSULE BY MOUTH DAILY     Gastroenterology: Proton Pump Inhibitors Failed - 06/23/2023  8:30 AM      Failed - Valid encounter within last 12 months    Recent Outpatient Visits           1 year ago Acute bacterial conjunctivitis of right eye   [facility] Donita Brooks, MD   1 year ago Bronchitis   [facility] Tanya Nones, Priscille Heidelberg, MD   1 year ago Upper airway cough syndrome   [facility] Donita Brooks, MD   2 years ago Prediabetes   [facility] Donita Brooks, MD   3 years ago History of prostate cancer   [facility] [name], Priscille Heidelberg, MD       Future Appointments             Today [name], Priscille Heidelberg, MD [facility]

## 2023-06-23 NOTE — Progress Notes (Signed)
Subjective:    Patient ID: [name], male    DOB: 02-20-1946, 77 y.o.   MRN: [id number]  HPI Patient is a pleasant 77 y/o WM here today for complete physical exam.  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 His bladder has been completely removed. They also removed his prostate at the same time. They did find a small focus of prostate cancer when he did this.  He had a colonoscopy in 2020 that was clear.  Therefore he does not require colon cancer screening.  He is due for a COVID booster as well as RSV.  We discussed this and he declined these.  We discussed the new pneumonia vaccine (Capvixen).  Patient would like to receive that today.  Recently had a urinary tract infection.  GFR has declined however this was checked while he was battling the urinary tract infection.  He is feeling much better now.  He is on the correct antibiotic for the last 3 days. Immunization History  Administered Date(s) Administered   Fluad Quad(high Dose 65+) 04/29/2019, 04/27/2020, 05/01/2022   Fluad Trivalent(High Dose 65+) 05/22/2023   Influenza Split 05/25/2013   Influenza,inj,Quad PF,6+ Mos 04/22/2014, 04/28/2015, 05/09/2016, 03/07/2017, 03/03/2018   PFIZER(Purple Top)SARS-COV-2 Vaccination 09/01/2019, 09/29/2019   Pneumococcal Conjugate-13 04/22/2014   Pneumococcal Polysaccharide-23 04/28/2015   Tdap 04/26/2019   Zoster Recombinant(Shingrix) 09/25/2021, 01/04/2022   Zoster, Live 01/27/2009   Past Medical History:  Diagnosis Date   Allergy    Bacteremia due to Gram-negative bacteria 04/13/2015   Bladder rupture 2010   Bladder tumor    Cancer [facility])    bladder   Chronic kidney disease    Depression    Gout    History of unilateral nephrectomy 07/2014   [facility]   Hyperlipidemia    Restless leg syndrome, controlled    Past Surgical History:  Procedure Laterality Date   APPENDECTOMY     BLADDER SURGERY  2010   COLONOSCOPY  06/05/2009   CYSTOSCOPY W/ RETROGRADES Bilateral 04/21/2013   Procedure:  CYSTOSCOPY WITH RETROGRADE PYELOGRAM;  Surgeon: Milford Cage, MD;  Location: [facility];  Service: Urology;  Laterality: Bilateral;   FRACTURE SURGERY Right 01/05/2005   lower leg/ankle   NASAL SEPTUM SURGERY     TONSILLECTOMY AND ADENOIDECTOMY     TRANSURETHRAL RESECTION OF BLADDER TUMOR WITH GYRUS (TURBT-GYRUS) N/A 04/21/2013   Procedure: TRANSURETHRAL RESECTION OF BLADDER TUMOR WITH GYRUS (TURBT-GYRUS), cystoscopy;  Surgeon: Milford Cage, MD;  Location: [facility];  Service: Urology;  Laterality: N/A;   Current Outpatient Medications on File Prior to Visit  Medication Sig Dispense Refill   allopurinol (ZYLOPRIM) 300 MG tablet TAKE 1 TABLET BY MOUTH DAILY 90 tablet 3   Cholecalciferol (VITAMIN D3) 1.25 MG (50000 UT) TABS      ciprofloxacin (CIPRO) 500 MG tablet Take 1 tablet (500 mg total) by mouth 2 (two) times daily for 7 days. 14 tablet 0   clonazePAM (KLONOPIN) 0.5 MG tablet Take 0.5 mg by mouth 2 (two) times daily as needed.     colchicine 0.6 MG tablet Take 0.6-1.2 mg by mouth 2 (two) times daily as needed (for gout flare).      escitalopram (LEXAPRO) 20 MG tablet Take 2 tablets (40 mg total) by mouth at bedtime. 180 tablet 3   ezetimibe (ZETIA) 10 MG tablet TAKE 1 TABLET BY MOUTH IN THE  MORNING 90 tablet 3   fluticasone (FLONASE) 50 MCG/ACT nasal spray USE 2 SPRAYS IN BOTH  NOSTRILS  DAILY 48 g 3   rOPINIRole (REQUIP) 4 MG tablet Take 4 mg by mouth at bedtime.      rosuvastatin (CRESTOR) 5 MG tablet TAKE 1 TABLET BY MOUTH DAILY 90 tablet 3   traZODone (DESYREL) 100 MG tablet Take 50 mg by mouth daily as needed for sleep.     vitamin B-12 (CYANOCOBALAMIN) 1000 MCG tablet Take 1,000 mcg by mouth daily.     gabapentin (NEURONTIN) 300 MG capsule TAKE 1 CAPSULE BY MOUTH 3  TIMES DAILY AS NEEDED (Patient not taking: Reported on 06/23/2023) 270 capsule 3   omeprazole (PRILOSEC) 20 MG capsule TAKE 1 CAPSULE BY MOUTH DAILY 90 capsule 0   No current facility-administered medications  on file prior to visit.   Allergies  Allergen Reactions   Augmentin [Amoxicillin-Pot Clavulanate] Nausea And Vomiting and Other (See Comments)    Has patient had a PCN reaction causing immediate rash, facial/tongue/throat swelling, SOB or lightheadedness with hypotension: No Has patient had a PCN reaction causing severe rash involving mucus membranes or skin necrosis: No Has patient had a PCN reaction that required hospitalization No Has patient had a PCN reaction occurring within the last 10 years: No If all of the above answers are "NO", then may proceed with Cephalosporin use.   Statins Other (See Comments)    Reaction:  Muscle pain    Amoxicillin    Nystatin    Social History   Socioeconomic History   Marital status: Married    Spouse name: Booker Sink   Number of children: 2   Years of education: Not on file   Highest education level: Bachelor's degree (e.g., BA, AB, BS)  Occupational History   Not on file  Tobacco Use   Smoking status: Former    Current packs/day: 0.00    Average packs/day: 0.5 packs/day for 30.0 years (15.0 ttl pk-yrs)    Types: Cigarettes    Start date: 07/25/1959    Quit date: 07/24/1989    Years since quitting: 33.9   Smokeless tobacco: Never  Vaping Use   Vaping status: Never Used  Substance and Sexual Activity   Alcohol use: Yes    Comment: social   Drug use: No   Sexual activity: Not on file  Other Topics Concern   Not on file  Social History Narrative   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   1 son died in 11-Jul-2017.   1 son living.   1 granddaughter.   Social Drivers of Corporate investment banker Strain: Low Risk  (06/17/2023)   Overall Financial Resource Strain (CARDIA)    Difficulty of Paying Living Expenses: Not hard at all  Food Insecurity: No Food Insecurity (06/17/2023)   Hunger Vital Sign    Worried About Running Out of Food in the Last Year: Never true    Ran Out of Food in the Last Year: Never true  Transportation Needs: No  Transportation Needs (06/17/2023)   PRAPARE - Administrator, Civil Service (Medical): No    Lack of Transportation (Non-Medical): No  Physical Activity: Insufficiently Active (06/17/2023)   Exercise Vital Sign    Days of Exercise per Week: 1 day    Minutes of Exercise per Session: 10 min  Stress: No Stress Concern Present (06/17/2023)   [employer] of Occupational Health - Occupational Stress Questionnaire    Feeling of Stress : Not at all  Social Connections: Socially Integrated (06/17/2023)   Social Connection and Isolation Panel [NHANES]  Frequency of Communication with Friends and Family: Once a week    Frequency of Social Gatherings with Friends and Family: Twice a week    Attends Religious Services: More than 4 times per year    Active Member of [employer] or Organizations: Yes    Attends Banker Meetings: 1 to 4 times per year    Marital Status: Married  Catering manager Violence: Not At Risk (06/21/2021)   Humiliation, Afraid, Rape, and Kick questionnaire    Fear of Current or Ex-Partner: No    Emotionally Abused: No    Physically Abused: No    Sexually Abused: No   Family History  Problem Relation Age of Onset   Cancer Mother        ovarian   Cirrhosis Father    Alcohol abuse Father    Colon cancer Neg Hx    Colon polyps Neg Hx    Esophageal cancer Neg Hx    Rectal cancer Neg Hx    Stomach cancer Neg Hx      Review of Systems  All other systems reviewed and are negative.      Objective:   Physical Exam Vitals reviewed.  Constitutional:      General: He is not in acute distress.    Appearance: He is well-developed. He is not diaphoretic.  HENT:     Head: Normocephalic and atraumatic.     Right Ear: External ear normal.     Left Ear: External ear normal.     Nose: Nose normal.     Mouth/Throat:     Pharynx: No oropharyngeal exudate.  Eyes:     General: No scleral icterus.       Right eye: No discharge.        Left eye: No  discharge.     Conjunctiva/sclera: Conjunctivae normal.     Pupils: Pupils are equal, round, and reactive to light.  Neck:     Thyroid: No thyromegaly.     Vascular: No JVD.     Trachea: No tracheal deviation.  Cardiovascular:     Rate and Rhythm: Normal rate and regular rhythm.     Heart sounds: Normal heart sounds. No murmur heard.    No friction rub. No gallop.  Pulmonary:     Effort: Pulmonary effort is normal. No respiratory distress.     Breath sounds: Normal breath sounds. No stridor. No wheezing or rales.  Chest:     Chest wall: No tenderness.  Abdominal:     General: Bowel sounds are normal. There is no distension.     Palpations: Abdomen is soft. There is no mass.     Tenderness: There is no abdominal tenderness. There is no guarding or rebound.  Musculoskeletal:        General: No tenderness or deformity. Normal range of motion.     Cervical back: Normal range of motion and neck supple.  Lymphadenopathy:     Cervical: No cervical adenopathy.  Skin:    General: Skin is warm.     Coloration: Skin is not pale.     Findings: No erythema or rash.  Neurological:     Mental Status: He is alert and oriented to person, place, and time.     Cranial Nerves: No cranial nerve deficit.     Motor: No abnormal muscle tone.     Coordination: Coordination normal.     Deep Tendon Reflexes: Reflexes are normal and symmetric.  Psychiatric:  Behavior: Behavior normal.        Thought Content: Thought content normal.        Judgment: Judgment normal.   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        Assessment & Plan:  CKD (chronic kidney disease) stage 4, GFR 15-29 ml/min (HCC) - Plan: BASIC METABOLIC PANEL WITH GFR, Protein / Creatinine Ratio, Urine  General medical exam  Hyperlipidemia, unspecified hyperlipidemia type  Prediabetes Physical exam today is normal.  Blood pressure is excellent.  I am concerned about decline in his kidney function.   I recommended that when he finishes the antibiotics, he should, and to repeat a BMP.  His baseline creatinine is 1.8.  I would also like to check a urine protein creatinine ratio.  I am not sure that taking Marcelline Deist would be in his best interest given the frequency of urinary tract infections.  However if the patient has elevated proteinuria he may benefit from [place].  Patient received a pneumonia vaccine today.  He declines RSV.  He declines COVID.

## 2023-07-04 ENCOUNTER — Other Ambulatory Visit: Payer: Medicare Other

## 2023-07-05 LAB — PROTEIN / CREATININE RATIO, URINE
Creatinine, Urine: 99 mg/dL (ref 20–320)
Protein/Creat Ratio: 485 mg/g{creat} — ABNORMAL HIGH (ref 25–148)
Protein/Creatinine Ratio: 0.485 mg/mg{creat} — ABNORMAL HIGH (ref 0.025–0.148)
Total Protein, Urine: 48 mg/dL — ABNORMAL HIGH (ref 5–25)

## 2023-07-05 LAB — BASIC METABOLIC PANEL WITH GFR
BUN/Creatinine Ratio: 16 (calc) (ref 6–22)
BUN: 27 mg/dL — ABNORMAL HIGH (ref 7–25)
CO2: 22 mmol/L (ref 20–32)
Calcium: 8.8 mg/dL (ref 8.6–10.3)
Chloride: 106 mmol/L (ref 98–110)
Creat: 1.7 mg/dL — ABNORMAL HIGH (ref 0.70–1.28)
Glucose, Bld: 103 mg/dL — ABNORMAL HIGH (ref 65–99)
Potassium: 4.8 mmol/L (ref 3.5–5.3)
Sodium: 140 mmol/L (ref 135–146)
eGFR: 41 mL/min/{1.73_m2} — ABNORMAL LOW (ref >=60)

## 2023-07-10 ENCOUNTER — Ambulatory Visit (INDEPENDENT_AMBULATORY_CARE_PROVIDER_SITE_OTHER): Payer: Medicare Other | Admitting: Otolaryngology

## 2023-07-10 ENCOUNTER — Encounter (INDEPENDENT_AMBULATORY_CARE_PROVIDER_SITE_OTHER): Payer: Self-pay | Admitting: Otolaryngology

## 2023-07-10 ENCOUNTER — Other Ambulatory Visit: Payer: Self-pay

## 2023-07-10 VITALS — BP 116/70 | HR 86 | Ht 69.0 in | Wt 192.0 lb

## 2023-07-10 DIAGNOSIS — R0982 Postnasal drip: Secondary | ICD-10-CM

## 2023-07-10 DIAGNOSIS — R490 Dysphonia: Secondary | ICD-10-CM | POA: Diagnosis not present

## 2023-07-10 DIAGNOSIS — J383 Other diseases of vocal cords: Secondary | ICD-10-CM

## 2023-07-10 DIAGNOSIS — J3089 Other allergic rhinitis: Secondary | ICD-10-CM | POA: Diagnosis not present

## 2023-07-10 DIAGNOSIS — K219 Gastro-esophageal reflux disease without esophagitis: Secondary | ICD-10-CM

## 2023-07-10 DIAGNOSIS — E785 Hyperlipidemia, unspecified: Secondary | ICD-10-CM

## 2023-07-10 MED ORDER — LOSARTAN POTASSIUM 50 MG PO TABS: 50.0000 mg | ORAL_TABLET | Freq: Every day | ORAL | 1 refills | Status: DC

## 2023-07-10 NOTE — Progress Notes (Signed)
 ENT CONSULT:  Reason for Consult: dysphonia x 2 yrs    HPI: Discussed the use of AI scribe software for clinical note transcription with the patient, who gave verbal consent to proceed.  History of Present Illness   The patient is a 26 yoM, with a history of hearing loss, presents with a two-year history of voice changes, described as becoming 'raspy' and less loud. He denies any associated pain or complete loss of voice. He reports nocturnal coughing episodes, severe enough to cause throat discomfort, but denies any difficulty swallowing food/coughing or choking when eating or drinking. He also notes a persistent sensation of mucus in his throat. He denies any significant history of heartburn or reflux, but does have a history of allergies for which he takes Flonase . He reports no noticeable postnasal drainage. He has a remote history of smoking, having quit in 1990, and denies any history of lung disease. He has not sought any treatment for his voice changes prior to this consultation.     Records Reviewed:  OV w/Dr Karis ENT 05/28/23 Follow-up: Bilateral tinnitus, bilateral hearing loss   HPI: The patient is a 78 year old male who returns today for his follow-up evaluation.  He was last seen 1 year ago.  At that time, he was complaining of bilateral tinnitus.  His tinnitus was high-pitched and nonpulsatile.  He was noted to have bilateral high-frequency sensorineural hearing loss.  The strategies to cope with tinnitus were discussed with the patient.  The patient returns today reporting improvement in his tinnitus.  The severity and frequency of the tinnitus have decreased.  He denies any recent change in his hearing.  He has a new complaint of intermittent dysphonia.  He is interested in having a laryngology evaluation.     Past Medical History:  Diagnosis Date   Allergy    Bacteremia due to Gram-negative bacteria 04/13/2015   Bladder rupture 2010   Bladder tumor    Cancer [facility])    bladder    Chronic kidney disease    Depression    Gout    History of unilateral nephrectomy 07/2014   [facility]   Hyperlipidemia    Restless leg syndrome, controlled     Past Surgical History:  Procedure Laterality Date   APPENDECTOMY     BLADDER SURGERY  2010   COLONOSCOPY  06/05/2009   CYSTOSCOPY W/ RETROGRADES Bilateral 04/21/2013   Procedure: CYSTOSCOPY WITH RETROGRADE PYELOGRAM;  Surgeon: Toribio Neysa Repine, MD;  Location: [facility];  Service: Urology;  Laterality: Bilateral;   FRACTURE SURGERY Right 01/05/2005   lower leg/ankle   NASAL SEPTUM SURGERY     TONSILLECTOMY AND ADENOIDECTOMY     TRANSURETHRAL RESECTION OF BLADDER TUMOR WITH GYRUS (TURBT-GYRUS) N/A 04/21/2013   Procedure: TRANSURETHRAL RESECTION OF BLADDER TUMOR WITH GYRUS (TURBT-GYRUS), cystoscopy;  Surgeon: Toribio Neysa Repine, MD;  Location: [facility];  Service: Urology;  Laterality: N/A;    Family History  Problem Relation Age of Onset   Cancer Mother        ovarian   Cirrhosis Father    Alcohol abuse Father    Colon cancer Neg Hx    Colon polyps Neg Hx    Esophageal cancer Neg Hx    Rectal cancer Neg Hx    Stomach cancer Neg Hx     Social History:  reports that he quit smoking about 33 years ago. His smoking use included cigarettes. He started smoking about 64 years ago. He has a 15 pack-year smoking history. He  has never used smokeless tobacco. He reports current alcohol use. He reports that he does not use drugs.  Allergies:  Allergies  Allergen Reactions   Augmentin [Amoxicillin-Pot Clavulanate] Nausea And Vomiting and Other (See Comments)    Has patient had a PCN reaction causing immediate rash, facial/tongue/throat swelling, SOB or lightheadedness with hypotension: No Has patient had a PCN reaction causing severe rash involving mucus membranes or skin necrosis: No Has patient had a PCN reaction that required hospitalization No Has patient had a PCN reaction occurring within the last 10 years: No If all of the  above answers are NO, then may proceed with Cephalosporin use.   Statins Other (See Comments)    Reaction:  Muscle pain    Amoxicillin    Clavulanic Acid    Nystatin     Medications: I have reviewed the patient's current medications.  The PMH, PSH, Medications, Allergies, and SH were reviewed and updated.  ROS: Constitutional: Negative for fever, weight loss and weight gain. Cardiovascular: Negative for chest pain and dyspnea on exertion. Respiratory: Is not experiencing shortness of breath at rest. Gastrointestinal: Negative for nausea and vomiting. Neurological: Negative for headaches. Psychiatric: The patient is not nervous/anxious  Blood pressure 116/70, pulse 86, height 5' 9 (1.753 m), weight 192 lb (87.1 kg), SpO2 99%.  PHYSICAL EXAM:  Exam: General: Well-developed, well-nourished Communication and Voice: raspy poor projection Respiratory Respiratory effort: Equal inspiration and expiration without stridor Cardiovascular Peripheral Vascular: Warm extremities with equal color/perfusion Eyes: No nystagmus with equal extraocular motion bilaterally Neuro/Psych/Balance: Patient oriented to person, place, and time; Appropriate mood and affect; Gait is intact with no imbalance; Cranial nerves I-XII are intact Head and Face Inspection: Normocephalic and atraumatic without mass or lesion Palpation: Facial skeleton intact without bony stepoffs Salivary Glands: No mass or tenderness Facial Strength: Facial motility symmetric and full bilaterally ENT Pinna: External ear intact and fully developed External canal: Canal is patent with intact skin Tympanic Membrane: Clear and mobile External Nose: No scar or anatomic deformity Internal Nose: Septum is deviated to the left. No polyp, or purulence. Mucosal edema and erythema present.  Bilateral inferior turbinate hypertrophy.  Lips, Teeth, and gums: Mucosa and teeth intact and viable TMJ: No pain to palpation with full  mobility Oral cavity/oropharynx: No erythema or exudate, no lesions present Nasopharynx: No mass or lesion with intact mucosa Hypopharynx: Intact mucosa without pooling of secretions Larynx Glottic: Full true vocal cord mobility without lesion or mass Supraglottic: Normal appearing epiglottis and AE folds Interarytenoid Space: Moderate pachydermia&edema Subglottic Space: Patent without lesion or edema Neck Neck and Trachea: Midline trachea without mass or lesion Thyroid : No mass or nodularity Lymphatics: No lymphadenopathy  Procedure:  Preoperative diagnosis: hoarseness  Postoperative diagnosis:   same  Procedure: Flexible fiberoptic laryngoscopy with stroboscopy (68420)  Surgeon: Elena Larry, MD  Anesthesia: Topical lidocaine  and Afrin  Complications: None  Condition is stable throughout exam  Indications and consent:   The patient presents to the clinic with hoarseness. All the risks, benefits, and potential complications were reviewed with the patient preoperatively and informed verbal consent was obtained.  Procedure: The patient was seated upright in the exam chair.   Topical lidocaine  and Afrin were applied to the nasal cavity. After adequate anesthesia had occurred, the flexible telescope was passed into the nasal cavity. The nasopharynx was patent without mass or lesion. The scope was passed behind the soft palate and directed toward the base of tongue. The base of tongue was visualized and was  symmetric with no apparent masses or abnormal appearing tissue. There were no signs of a mass or pooling of secretions in the piriform sinuses. The supraglottic structures were normal.  The true vocal cords are mobile. The medial edges were bowed with evidence of vocal fold atrophy. Closure was incomplete with spindle shaped glottic gap and supraglottic compression. Periodicity present. The mucosal wave and amplitude were intact. There is moderate interarytenoid pachydermia and  post cricoid edema. The mucosa appears without lesions.   The laryngoscope was then slowly withdrawn and the patient tolerated the procedure well. There were no complications or blood loss.  Studies Reviewed: CXR 2-View 10/09/22 Narrative & Impression  CLINICAL DATA:  Malignant neoplasm of the prostate   EXAM: CHEST - 2 VIEW   COMPARISON:  Chest x-ray 03/01/2022.  CT of the chest 04/12/2015.   Narrative & Impression CLINICAL DATA:  Malignant neoplasm of the prostate   EXAM: CHEST - 2 VIEW   COMPARISON:  Chest x-ray 03/01/2022.  CT of the chest 04/12/2015.   FINDINGS: Small nodular density in the left costophrenic angle measures 5 mm and appears unchanged corresponding to calcified granuloma on CT. The lungs are otherwise clear. There is no pleural effusion or pneumothorax identified. Cardiomediastinal silhouette is within normal limits. No acute osseous findings.   IMPRESSION: No active cardiopulmonary disease FINDINGS: Small nodular density in the left costophrenic angle measures 5 mm and appears unchanged corresponding to calcified granuloma on CT. The lungs are otherwise clear. There is no pleural effusion or pneumothorax identified. Cardiomediastinal silhouette is within normal limits. No acute osseous findings.   IMPRESSION: No active cardiopulmonary disease    Assessment/Plan: Encounter Diagnoses  Name Primary?   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 Examination shows thin vocal folds with incomplete closure, leading to compensatory supraglottic compression. No lesions noted. Discussed elective treatments: voice therapy, temporary VF injection augmentation (6-12 months relief), and permanent option  with thyroplasty (OR procedure).  - Refer to voice therapy - Provide after-visit summary on reflux management and dietary changes  Postnasal Drip and mucus sensation Chronic mucus in the throat, likely allergy-related.  - Continue Flonase  2 puffs b/l nares - Provide handout on dietary changes to reduce mucus and GERD   GERD LPR Mild reflux irritation observed on scope exam today. Discussed non-pharmacological management: dietary changes and seaweed-based supplement Reflux Gourmet. Advised against omeprazole  due to potential long-term side effects and uncertain benefit. - Recommend Reflux Gourmet and diet/lifestyle changes to minimize reflux - Provided after-visit summary with dietary recommendations  Follow-up - Schedule follow-up appointment in a few months after voice therapy    Thank you for allowing me to participate in the care of this patient. Please do not hesitate to contact me with any questions or concerns.   Elena Larry, MD Otolaryngology [facility] Phone: [phone] Fax: [phone]    07/10/2023, 8:49 PM

## 2023-07-10 NOTE — Patient Instructions (Addendum)
 GamingLesson.nl - check out this website to learn more about reflux   -Avoid lying down for at least two hours after a meal or after drinking acidic beverages, like soda, or other caffeinated beverages. This can help to prevent stomach contents from flowing back into the esophagus. -Keep your head elevated while you sleep. Using an extra pillow or two can also help to prevent reflux. -Eat smaller and more frequent meals each day instead of a few large meals. This promotes digestion and can aid in preventing heartburn. -Wear loose-fitting clothes to ease pressure on the stomach, which can worsen heartburn and reflux. -Reduce excess weight around the midsection. This can ease pressure on the stomach. Such pressure can force some stomach contents back up the esophagus   - Take Reflux Gourmet (natural supplement available on Amazon) to help with symptoms of chronic throat irritation      MUCUS MANAGEMENT  Several factors can cause the sensation of increased mucus in the throat, including dryness, acid reflux, and increased mucus production from allergies or chronic sinus drainage.  There is also some evidence that added sugars or processed sugars in the diet (not the kind that occur naturally in honey or ripe fruit) can increase mucus, as well as too much dairy or refined carbohydrates. To avoid these refined carbohydrates, on food labels, watch out for "wheat flour" (also called "white," "refined" or "enriched" flour) on the ingredients list. The below website has several good options for mucus management.  leedsportal.com.pdf  Some recommendations: -Water, water, water -Cut back on caffeine -Steam treatments during the day (Personal QUALCOMM are available at [employer] and drugstores. Amazon sells one called Vick's steam inhaler that people like. A cheaper alternative is a bowl of warm water with a towel over your head. You can breathe in  the steam for a couple of minutes, especially if you are about to use your voice a lot, or when you are feeling particularly dry in your throat or mouth.) -Humidifier at night. Put this right next to your head so that the mist falls on your face. Do be aware that indoor dampness can promote bacteria, mold, and dust mite growth, so if you have a dust mite allergy you should be sure to use mattress and pillow covers and avoid excessive humidification. -Prevention of acid reflux by avoiding late-night eating (nothing 3 hours before laying down at night), greasy and spicy foods, and acidic foods -Mucinex (over-the-counter, generic name guaifenesin. Buy the kind without a cough suppressant or decongestant added). This medicine doesn't work well if you are not well hydrated, so drink plenty of water on days when you take it. -Xylimelts -Nasal saline spray such as Simply Saline (or any nasal saline without preservatives, in 0.9% concentration) -Nasal saline irrigations with NeilMed rinse kit, Neti Pot, Active Sinus, or Nasopure irrigation bottles (available in any pharmacy or grocery store, and all available on [place]) -Avoid the things you are allergic to if you have allergies (use dust mite covers on your bed, wash your hair at night instead of morning if you have pollen allergies) - see this website for more information on managing allergies if this is a problem for you: www.sinusallergy.http://hicks.info/ -Hall's Breezers (sugar-free), Grether's black currant pastilles, and Entertainer's Secret Throat Relief can all help dry mouth and thickened mucous. See website above for details. -If you smoke, stop. -Low-processed-sugar diet. Processed sugars may increase mucus and even generalized inflammation in the body. The FDA is changing the way they label foods  soon so that it's easier to tell when sugars occur naturally in the food (which is not harmful to our health) vs when processed sugars or syrups have been added to the  food (which may increase inflammation in the body).  In the meantime, you can research the plant-based diet, anti-inflammatory diet, mediterranean diet, or the paleo diet to get an idea of foods that you might want to avoid. -Dairy - some patients find that eating a lot of dairy products worsens their mucus. -Sleep apnea - if you have sleep apnea and don't treat it, consider starting up your CPAP again (and be sure to use the humidification). Snoring and struggling to keep your airway open all night long is traumatizing to the lining of your throat and can increase irritation.           Travel steam inhalers/humidifiers: www.amandaflynnvoice.com http://www.amazon.com/Air-O-Swiss-7146-Travel-Ultrasonic-Humidifier/dp/B001JL4LZ4? https://www.jenkins-webster.com/

## 2023-08-12 NOTE — Therapy (Signed)
 OUTPATIENT SPEECH LANGUAGE PATHOLOGY VOICE EVALUATION   Patient Name: [name] MRN: [id number] DOB:09/01/1945, 78 y.o., male Today's Date: 08/14/2023  PCP: Duanne Butler DASEN, MD REFERRING PROVIDER: Okey Burns, MD  END OF SESSION:  End of Session - 08/14/23 1627     Visit Number 1    Number of Visits 7    Date for SLP Re-Evaluation 09/25/23    Authorization Type UHC    SLP Start Time 1400    SLP Stop Time  1445    SLP Time Calculation (min) 45 min    Activity Tolerance Patient tolerated treatment well             Past Medical History:  Diagnosis Date   Allergy    Bacteremia due to Gram-negative bacteria 04/13/2015   Bladder rupture 2010   Bladder tumor    Cancer [facility])    bladder   Chronic kidney disease    Depression    Gout    History of unilateral nephrectomy 07/2014   [facility]   Hyperlipidemia    Restless leg syndrome, controlled    Past Surgical History:  Procedure Laterality Date   APPENDECTOMY     BLADDER SURGERY  2010   COLONOSCOPY  06/05/2009   CYSTOSCOPY W/ RETROGRADES Bilateral 04/21/2013   Procedure: CYSTOSCOPY WITH RETROGRADE PYELOGRAM;  Surgeon: Toribio Neysa Repine, MD;  Location: [facility];  Service: Urology;  Laterality: Bilateral;   FRACTURE SURGERY Right 01/05/2005   lower leg/ankle   NASAL SEPTUM SURGERY     TONSILLECTOMY AND ADENOIDECTOMY     TRANSURETHRAL RESECTION OF BLADDER TUMOR WITH GYRUS (TURBT-GYRUS) N/A 04/21/2013   Procedure: TRANSURETHRAL RESECTION OF BLADDER TUMOR WITH GYRUS (TURBT-GYRUS), cystoscopy;  Surgeon: Toribio Neysa Repine, MD;  Location: [facility];  Service: Urology;  Laterality: N/A;   Patient Active Problem List   Diagnosis Date Noted   Dysphonia 05/28/2023   Sensorineural hearing loss, bilateral 05/28/2023   Tinnitus of both ears 05/28/2023   Allergic conjunctivitis of right eye 08/06/2022   MRSA (methicillin resistant staph aureus) culture positive 12/27/2019   History of nephrectomy 08/24/2016   CKD (chronic  kidney disease) stage 3, GFR 30-59 ml/min (HCC) 08/24/2016   Sepsis due to Escherichia coli (E. 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 He denies any associated pain or complete loss of voice. He reports nocturnal coughing episodes, severe enough to cause throat discomfort, but denies any difficulty swallowing food/coughing or choking when eating or drinking. He also notes a persistent sensation of mucus in his throat. He denies any significant history of heartburn or reflux, but does have a history of allergies for which he takes Flonase . He reports no noticeable postnasal drainage. He has a remote history of smoking, having quit in 1990, and  denies any history of lung disease. He has not sought any treatment for his voice changes prior to this consultation.  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 Examination shows thin vocal folds with incomplete closure, leading to compensatory supraglottic compression. No lesions noted. Discussed elective treatments: voice therapy, temporary VF injection augmentation (6-12 months relief), and  permanent option with thyroplasty (OR procedure).  GERD LPR Mild reflux irritation observed on scope exam today. Discussed non-pharmacological management: dietary changes and seaweed-based supplement Reflux Gourmet. Advised against omeprazole  due to potential long-term side effects and uncertain benefit. - Recommend Reflux Gourmet and diet/lifestyle changes to minimize reflux - Provided after-visit summary with dietary recommendations  PAIN:  Are you having pain? No  FALLS: Has patient fallen in last 6 months? No  LIVING ENVIRONMENT: Lives with: lives with their family and lives with their spouse Lives in: House/apartment  PLOF:Level of assistance: Independent with ADLs Employment: Part-time employment  PATIENT GOALS: I want to able to speak in a normal tone of voice without having to raise my voice to be heard  OBJECTIVE:  Note: Objective measures were completed at Evaluation unless otherwise noted.  DIAGNOSTIC FINDINGS: Flexible fiberoptic laryngoscopy with stroboscopy  The true vocal cords are mobile. The medial edges were bowed with evidence of vocal fold atrophy. Closure was incomplete with spindle shaped glottic gap and supraglottic compression. Periodicity present. The mucosal wave and amplitude were intact. There is moderate interarytenoid pachydermia and post cricoid edema. The mucosa appears without lesions.   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 2.5 On a scale of 0-5, where 0= never and 5= always, how often do you do the following?  Shout or scream 1 Talk loudly 1 Talk a lot 0 Talk over noise 3 Use the phone 1.5 Sing 0  PHYSICAL  SYMPTOMS: Do you have any burning, soreness, tickling, or irritation in your throat? No Does your voice get tired easily? No Do you feel as if you have to strain to produce voice? No Do you feel as if you need to cough or clear your throat a lot? Yes Do you ever lose your voice completely? No Do you often get sore throats? No Do you have difficulty projecting your voice? Yes  VOCAL ABUSE:  Episodes observed during evaluation: N/A Pt report of frequency: In the morning, throughout the day, and when laying down at night, fairly frequent Pt report of duration: Occasionally experiences  Pt description of cough episodes: wakes him up the morning, disturbs sleep at night Identified triggers: Acid Reflux, laying down after eating/drinking  PERCEPTUAL VOICE ASSESSMENT: Voice quality: rough and low vocal intensity Vocal abuse:  habitual voice clearing/coughing Resonance: normal Respiratory function: thoracic breathing  OBJECTIVE VOICE ASSESSMENT: Maximum phonation time for sustained ah: 15 seconds Conversational pitch average: 117 Hz Conversational loudness average: 59 dB S/z ratio: S: 20 seconds Z: 13 seconds (Suggestive of dysfunction >1.0)   Pt does not report difficulty with swallowing, which does not warrant further evaluation  PATIENT REPORTED OUTCOME MEASURES (PROM): Voice Related Quality of Life Measure Pt was asked to rate on a scale of 1-5 how much of  a problem different speaking situations have been over the past two weeks     Because of my voice...  Pt Rating  I have trouble speaking loudly or being heard in noisy situations 3  I run out of air and need to take frequent breaths when talking 1  I sometimes do not know what will come out when I begin speaking 1  I am sometimes anxious or frustrated because of my voice 2  I sometimes get depressed because of my voice 1  I have trouble using the telephone because of my voice 2  I have trouble doing my job or practicing my  profession because of my voice 1  I avoid going out socially because of my voice 1  I have to repeat myself to be understood 3  I have become less outgoing because of my voice 1  Calculated score 85  1= none, not a problem 5= problem is as bad as it can be  >80 is considered WNL   TODAY'S TREATMENT:                                                                                                                                         DATE:  08/14/23: ST completed initial evaluation, provided pt education for results and recommendations. Following evaluation, SLP provided pt education for reflux precautions and effect of laryngopharyngeal reflux on voice mechanism. Pt with teach back of using reflux supplement following trigger foods and prior to bed, able to list x3 trigger foods, and verbalize via teach back of likelihood of reflux vs sensation of post nasal drainage 2/2 imaging done with ENT and reported sx.   PATIENT EDUCATION: Education details: See above Person educated: Patient Education method: Explanation Education comprehension: verbalized understanding  HOME EXERCISE PROGRAM: Will be established next session   GOALS: Goals reviewed with patient? Yes  SHORT TERM GOALS: Target date: 09/11/2023  Pt will complete demonstrate semi-occluded vocal tract exercises using belly breaths with mod-I Baseline: Goal status: INITIAL  2.  Pt will achieve average of 75+ dB during high intensity vocal exercises with rare min-A over 2 sessions Baseline:  Goal status: INITIAL  3.  Pt will report HEP completion daily over 1 week period Baseline:  Goal status: INITIAL  4.  Pt will report implementation of vocal hygiene recommendations over 1 week period Baseline:  Goal status: INITIAL   LONG TERM GOALS: Target date: 09/25/2023  Pt will be independent with HEP Baseline:  Goal status: INITIAL  2.  Pt will achieve average of 80+ dB during high intensity vocal exercises with rare min-A  over 2 sessions Baseline:  Goal status: INITIAL  3.  Pt will maintain WNL volume (70-72 dB) during 15 minute conversation  Baseline:  Goal status: INITIAL  ASSESSMENT:  CLINICAL IMPRESSION: Patient is a 78 y.o. M who was seen today for voice evaluation. Evaluation reveals moderate  dysphonia. Pt's voice is c/b low vocal intensity and intermittent hoarseness. Pt reports challenges with projection of voice and need for increased effort which is not always effective for meeting communication needs. Pt is speaker at church, speaks to wife who is [facility] [employer]. Reports usual requests for repetition. Additionally presenting with usual throat clearing throughout evaluation. Education initiated for reflux precautions with review of recommendations from ENT. Pt reports has not consistently been using reflux supplement, encouraged daily use to allow time for observance of effiacy. Pt agreeable. Pt would benefit from skilled ST to address aforementioned deficits to enhance communication efficacy.    OBJECTIVE IMPAIRMENTS: include voice disorder. These impairments are limiting patient from effectively communicating at home and in community. Factors affecting potential to achieve goals and functional outcome are  etiology . Patient will benefit from skilled SLP services to address above impairments and improve overall function.  REHAB POTENTIAL: Good  PLAN:  SLP FREQUENCY: 1x/week (SLP recommended 2x week, pt elects for reduced frequency d/t other commitments)   SLP DURATION: 6 weeks  PLANNED INTERVENTIONS: Cueing hierachy, SLP instruction and feedback, Compensatory strategies, and Patient/family education  Graduate clinician Schuyler Foley present for this session and participated in administration of therapeutic interventions under my supervision.   Harlene LITTIE Ned, CCC-SLP 08/14/2023, 4:27 PM

## 2023-08-14 ENCOUNTER — Encounter: Payer: Self-pay | Admitting: Speech Pathology

## 2023-08-14 ENCOUNTER — Ambulatory Visit: Payer: Medicare Other | Attending: Otolaryngology | Admitting: Speech Pathology

## 2023-08-14 DIAGNOSIS — J383 Other diseases of vocal cords: Secondary | ICD-10-CM | POA: Diagnosis not present

## 2023-08-14 DIAGNOSIS — R490 Dysphonia: Secondary | ICD-10-CM | POA: Diagnosis present

## 2023-08-21 ENCOUNTER — Ambulatory Visit: Payer: Medicare Other | Admitting: Speech Pathology

## 2023-08-21 DIAGNOSIS — R490 Dysphonia: Secondary | ICD-10-CM

## 2023-08-21 NOTE — Patient Instructions (Signed)
Practice 10 abdominal breaths  PHoRTE - twice a day  [email or web address]   10 Loud AH's as loud as you can and as long as you can       To help coach you at home with your loud AH!!  2. 10 Pitch glides up, 10 pitch glides down   3. 10 sentences in loud high pitch voice, like you are calling your neighbor over the phone  4. 10 sentences in loud low authoritative pitch, like you are the boss  Use a good belly breath before each exercise- feel your abs contract in as you use your voice  Keep reducing throat clears

## 2023-08-21 NOTE — Therapy (Signed)
OUTPATIENT SPEECH LANGUAGE PATHOLOGY VOICE TREATMENT   Patient Name: [name] MRN: [id number] DOB:1945-12-09, 78 y.o., male Today's Date: 08/21/2023  PCP: Donita Brooks, MD REFERRING PROVIDER: Ashok Croon, MD  END OF SESSION:  End of Session - 08/21/23 1355     Visit Number 2    Number of Visits 7    Date for SLP Re-Evaluation 09/25/23    Authorization Type U[employer]    SLP Start Time 1400    SLP Stop Time  1445    SLP Time Calculation (min) 45 min    Activity Tolerance Patient tolerated treatment well              Past Medical History:  Diagnosis Date   Allergy    Bacteremia due to Gram-negative bacteria 04/13/2015   Bladder rupture 2010   Bladder tumor    Cancer [facility])    bladder   Chronic kidney disease    Depression    Gout    History of unilateral nephrectomy 07/2014   [facility]   Hyperlipidemia    Restless leg syndrome, controlled    Past Surgical History:  Procedure Laterality Date   APPENDECTOMY     BLADDER SURGERY  2010   COLONOSCOPY  06/05/2009   CYSTOSCOPY W/ RETROGRADES Bilateral 04/21/2013   Procedure: CYSTOSCOPY WITH RETROGRADE PYELOGRAM;  Surgeon: Milford Cage, MD;  Location: [facility];  Service: Urology;  Laterality: Bilateral;   FRACTURE SURGERY Right 01/05/2005   lower leg/ankle   NASAL SEPTUM SURGERY     TONSILLECTOMY AND ADENOIDECTOMY     TRANSURETHRAL RESECTION OF BLADDER TUMOR WITH GYRUS (TURBT-GYRUS) N/A 04/21/2013   Procedure: TRANSURETHRAL RESECTION OF BLADDER TUMOR WITH GYRUS (TURBT-GYRUS), cystoscopy;  Surgeon: Milford Cage, MD;  Location: [facility];  Service: Urology;  Laterality: N/A;   Patient Active Problem List   Diagnosis Date Noted   Dysphonia 05/28/2023   Sensorineural hearing loss, bilateral 05/28/2023   Tinnitus of both ears 05/28/2023   Allergic conjunctivitis of right eye 08/06/2022   MRSA (methicillin resistant staph aureus) culture positive 12/27/2019   History of nephrectomy 08/24/2016   CKD (chronic  kidney disease) stage 3, GFR 30-59 ml/min (HCC) 08/24/2016   Sepsis due to Escherichia coli (E. 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    PERTINENT HISTORY: Per referring MD PN, "The patient is a 89 yoM, with a history of hearing loss, presents with a two-year history of voice changes, described as becoming 'raspy' and less loud. He denies any associated pain or complete loss of voice. He reports nocturnal coughing episodes, severe enough to cause throat discomfort, but denies any difficulty swallowing food/coughing or choking when eating or drinking. He also notes a persistent sensation of mucus in his throat. He denies any significant history of heartburn or reflux, but does have a history of allergies for which he takes Flonase. He reports no noticeable postnasal drainage. He has a remote history of smoking, having quit in 1990, and denies any history of lung  disease. 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 Examination shows thin vocal folds with incomplete closure, leading to compensatory supraglottic compression. No lesions noted. Discussed elective treatments: voice therapy, temporary VF injection augmentation (6-12 months relief), and permanent option with  thyroplasty (OR procedure).  GERD LPR Mild reflux irritation observed on scope exam today. Discussed non-pharmacological management: dietary changes and seaweed-based supplement Reflux Gourmet. Advised against omeprazole due to potential long-term side effects and uncertain benefit. - Recommend Reflux Gourmet and diet/lifestyle changes to minimize reflux - Provided after-visit summary with dietary recommendations"  PAIN:  Are you having pain? No  FALLS: Has patient fallen in last 6 months? No  LIVING ENVIRONMENT: Lives with: lives with their family and lives with their spouse Lives in: House/apartment  PLOF:Level of assistance: Independent with ADLs Employment: Part-time employment  PATIENT GOALS: "I want to able to speak in a normal tone of voice without having to raise my voice to be heard"  OBJECTIVE:  Note: Objective measures were completed at Evaluation unless otherwise noted.  TODAY'S TREATMENT:                                                                                                                                          08/21/23: ST initiated semi occluded vocal tract exercises (SOVTE) and phonation resonance training exercises (PhoRTE) to target presbyphonia. SLP educated patient re: semi-occluded vocal tract exercises purpose and execution to reduce laryngeal tension and promote balanced and coordinated use of respiratory and phonatory sub-systems. SLP provided modeling and feedback throughout exercises. Pt demonstrated challenges completing exercises, demonstrating pressed resonance. Modification to occlusion via lip posture. Elected to move on to high resistance phonation exercises to avoid redundancy. Led pt through sustained phonation, pitch glides and phrases with ongoing cues for high energy, loud volume, and belly breaths. Pt averaging 78 dB with sustained phonation tasks. Vocal exercises leading to increased throat clearing and coughing. Patient education provided  re: vocal abuse, cough cycle, alternatives for improved vocal health. By end of session, pt demonstrating increased awareness and utilizing water sips with hard swallow as cough alternative x3.   08/14/23: ST completed initial evaluation, provided pt education for results and recommendations. Following evaluation, SLP provided pt education for reflux precautions and effect of laryngopharyngeal reflux on voice mechanism. Pt with teach back of using reflux supplement following trigger foods and prior to bed, able to list x3 trigger foods, and verbalize via teach back of likelihood of reflux vs sensation of post nasal drainage 2/2 imaging done with ENT and reported sx.   PATIENT EDUCATION: Education details: See above Person educated: Patient Education method: Explanation Education comprehension: verbalized understanding  HOME EXERCISE PROGRAM: Will be established next session   GOALS: Goals  reviewed with patient? Yes  SHORT TERM GOALS: Target date: 09/11/2023  Pt will complete demonstrate semi-occluded vocal tract exercises using belly breaths with mod-I Baseline: Goal status: IN PROGRESS  2.  Pt will achieve average of 75+ dB during high intensity vocal exercises with rare min-A over 2 sessions Baseline:  Goal status: IN PROGRESS  3.  Pt will report HEP completion daily over 1 week period Baseline:  Goal status: IN PROGRESS  4.  Pt will report implementation of vocal hygiene recommendations over 1 week period Baseline:  Goal status: IN PROGRESS   LONG TERM GOALS: Target date: 09/25/2023  Pt will be independent with HEP Baseline:  Goal status: IN PROGRESS  2.  Pt will achieve average of 80+ dB during high intensity vocal exercises with rare min-A over 2 sessions Baseline:  Goal status: IN PROGRESS  3.  Pt will maintain WNL volume (70-72 dB) during 15 minute conversation  Baseline:  Goal status: IN PROGRESS  ASSESSMENT:  CLINICAL IMPRESSION: Patient is a 78 y.o. M who was  seen today for ST tx session focusing on instruction for high energy vocal exercises and patient education for vocal hygiene. Pt response well to interventions, able to demonstrate strong, clear voicing up to 8-10 word sentences with good volume. Initiation of education for throat clear/cough alternatives to address ongoing cough/throat clearing throughout session. Pt evidencing understanding via demonstration x3 by conclusion of session, using hard swallow with water. Pt would continue to benefit from skilled ST.    OBJECTIVE IMPAIRMENTS: include voice disorder. These impairments are limiting patient from effectively communicating at home and in community. Factors affecting potential to achieve goals and functional outcome are  etiology . Patient will benefit from skilled SLP services to address above impairments and improve overall function.  REHAB POTENTIAL: Good  PLAN:  SLP FREQUENCY: 1x/week (SLP recommended 2x week, pt elects for reduced frequency d/t other commitments)   SLP DURATION: 6 weeks  PLANNED INTERVENTIONS: Cueing hierachy, SLP instruction and feedback, Compensatory strategies, and Patient/family education  Maia Breslow, CCC-SLP 08/21/2023, 2:50 PM

## 2023-08-21 NOTE — Therapy (Deleted)
[facility] [address] [place], [place], [address] Phone: [phone]   Fax:  [phone]  Patient Details  Name: [name] MRN: [id number] Date of Birth: 1945-11-19 Referring Provider:  Ashok Croon, MD  Encounter Date: 08/21/2023   Maia Breslow, CCC-SLP 08/21/2023, 1:51 PM  [facility] [address] [place], [place], [address] Phone: [phone]   Fax:  [phone]

## 2023-08-27 ENCOUNTER — Ambulatory Visit: Payer: Medicare Other

## 2023-09-03 ENCOUNTER — Other Ambulatory Visit: Payer: Self-pay | Admitting: Family Medicine

## 2023-09-04 ENCOUNTER — Ambulatory Visit: Payer: Medicare Other | Admitting: Speech Pathology

## 2023-09-04 DIAGNOSIS — R490 Dysphonia: Secondary | ICD-10-CM | POA: Diagnosis not present

## 2023-09-04 NOTE — Therapy (Signed)
 OUTPATIENT SPEECH LANGUAGE PATHOLOGY VOICE TREATMENT   Patient Name: [name] MRN: [id number] DOB:1946-05-02, 78 y.o., male Today's Date: 09/04/2023  PCP: Donita Brooks, MD REFERRING PROVIDER: Ashok Croon, MD  END OF SESSION:  End of Session - 09/04/23 1535     Visit Number 4    Number of Visits 7    Date for SLP Re-Evaluation 09/25/23    Authorization Type UHC    SLP Start Time 1445    SLP Stop Time  1530    SLP Time Calculation (min) 45 min    Activity Tolerance Patient tolerated treatment well               Past Medical History:  Diagnosis Date   Allergy    Bacteremia due to Gram-negative bacteria 04/13/2015   Bladder rupture 2010   Bladder tumor    Cancer [facility])    bladder   Chronic kidney disease    Depression    Gout    History of unilateral nephrectomy 07/2014   [facility]   Hyperlipidemia    Restless leg syndrome, controlled    Past Surgical History:  Procedure Laterality Date   APPENDECTOMY     BLADDER SURGERY  2010   COLONOSCOPY  06/05/2009   CYSTOSCOPY W/ RETROGRADES Bilateral 04/21/2013   Procedure: CYSTOSCOPY WITH RETROGRADE PYELOGRAM;  Surgeon: Milford Cage, MD;  Location: [facility];  Service: Urology;  Laterality: Bilateral;   FRACTURE SURGERY Right 01/05/2005   lower leg/ankle   NASAL SEPTUM SURGERY     TONSILLECTOMY AND ADENOIDECTOMY     TRANSURETHRAL RESECTION OF BLADDER TUMOR WITH GYRUS (TURBT-GYRUS) N/A 04/21/2013   Procedure: TRANSURETHRAL RESECTION OF BLADDER TUMOR WITH GYRUS (TURBT-GYRUS), cystoscopy;  Surgeon: Milford Cage, MD;  Location: [facility];  Service: Urology;  Laterality: N/A;   Patient Active Problem List   Diagnosis Date Noted   Dysphonia 05/28/2023   Sensorineural hearing loss, bilateral 05/28/2023   Tinnitus of both ears 05/28/2023   Allergic conjunctivitis of right eye 08/06/2022   MRSA (methicillin resistant staph aureus) culture positive 12/27/2019   History of nephrectomy 08/24/2016   CKD  (chronic kidney disease) stage 3, GFR 30-59 ml/min (HCC) 08/24/2016   Sepsis due to Escherichia coli (E. 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    PERTINENT HISTORY: Per referring MD PN, "The patient is a 62 yoM, with a history of hearing loss, presents with a two-year history of voice changes, described as becoming 'raspy' and less loud. He denies any associated pain or complete loss of voice. He reports nocturnal coughing episodes, severe enough to cause throat discomfort, but denies any difficulty swallowing food/coughing or choking when eating or drinking. He also notes a persistent sensation of mucus in his throat. He denies any significant history of heartburn or reflux, but does have a history of allergies for which he takes Flonase. He reports no noticeable postnasal drainage. He has a remote history of smoking, having quit in 1990, and denies  any history of lung disease. 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 Examination shows thin vocal folds with incomplete closure, leading to compensatory supraglottic compression. No lesions noted. Discussed elective treatments: voice therapy, temporary VF injection augmentation  (6-12 months relief), and permanent option with thyroplasty (OR procedure).  GERD LPR Mild reflux irritation observed on scope exam today. Discussed non-pharmacological management: dietary changes and seaweed-based supplement Reflux Gourmet. Advised against omeprazole due to potential long-term side effects and uncertain benefit. - Recommend Reflux Gourmet and diet/lifestyle changes to minimize reflux - Provided after-visit summary with dietary recommendations"  PAIN:  Are you having pain? No  FALLS: Has patient fallen in last 6 months? No  LIVING ENVIRONMENT: Lives with: lives with their family and lives with their spouse Lives in: House/apartment  PLOF:Level of assistance: Independent with ADLs Employment: Part-time employment  PATIENT GOALS: "I want to able to speak in a normal tone of voice without having to raise my voice to be heard"  OBJECTIVE:  Note: Objective measures were completed at Evaluation unless otherwise noted.  TODAY'S TREATMENT:                                                                                                                                         09/04/23: SLP continued implementing PhoRTE to target vocal fold atrophy. Pt completed exercises effectively benefiting from min-A, "stair stepping" glides for improved vocal quality, and intermittent cues for a clear, strong voice. SLP introduced additional breathing exercises for suppression of habitual throat clear with pt demonstrating strategies and verbalizing understanding. SLP initiated carryover of strong, clear voice to unstructured conversation regarding pt's upcoming plans with pt participating effectively given mod/min-I and model.  Loud "Ah" avg: 99  Glides: 85 Phrases: 80 Conversational average: 72  08/21/23: ST initiated semi occluded vocal tract exercises (SOVTE) and phonation resonance training exercises (PhoRTE) to target presbyphonia. SLP educated patient re: semi-occluded vocal tract  exercises purpose and execution to reduce laryngeal tension and promote balanced and coordinated use of respiratory and phonatory sub-systems. SLP provided modeling and feedback throughout exercises. Pt demonstrated challenges completing exercises, demonstrating pressed resonance. Modification to occlusion via lip posture. Elected to move on to high resistance phonation exercises to avoid redundancy. Led pt through sustained phonation, pitch glides and phrases with ongoing cues for high energy, loud volume, and belly breaths. Pt averaging 78 dB with sustained phonation tasks. Vocal exercises leading to increased throat clearing and coughing. Patient education provided re: vocal abuse, cough cycle, alternatives for improved vocal health. By end of session, pt demonstrating increased awareness and utilizing water sips with hard swallow as cough alternative x3.   08/14/23: ST completed initial evaluation, provided pt education for results and recommendations. Following evaluation, SLP  provided pt education for reflux precautions and effect of laryngopharyngeal reflux on voice mechanism. Pt with teach back of using reflux supplement following trigger foods and prior to bed, able to list x3 trigger foods, and verbalize via teach back of likelihood of reflux vs sensation of post nasal drainage 2/2 imaging done with ENT and reported sx.   PATIENT EDUCATION: Education details: See above Person educated: Patient Education method: Explanation Education comprehension: verbalized understanding  HOME EXERCISE PROGRAM: Will be established next session   GOALS: Goals reviewed with patient? Yes  SHORT TERM GOALS: Target date: 09/11/2023  Pt will complete demonstrate semi-occluded vocal tract exercises using belly breaths with mod-I Baseline: Goal status: MET  2.  Pt will achieve average of 75+ dB during high intensity vocal exercises with rare min-A over 2 sessions Baseline:  Goal status: PARTIALLY MET  3.  Pt  will report HEP completion daily over 1 week period Baseline:  Goal status: PARTIALLY MET  4.  Pt will report implementation of vocal hygiene recommendations over 1 week period Baseline:  Goal status: IN PROGRESS   LONG TERM GOALS: Target date: 09/25/2023  Pt will be independent with HEP Baseline:  Goal status: IN PROGRESS  2.  Pt will achieve average of 80+ dB during high intensity vocal exercises with rare min-A over 2 sessions Baseline:  Goal status: PARTIALLY MET  3.  Pt will maintain WNL volume (70-72 dB) during 15 minute conversation  Baseline:  Goal status: MET  ASSESSMENT:  CLINICAL IMPRESSION: Patient is a 78 y.o. M who was seen today for ST tx session focusing on instruction for high energy vocal exercises and patient education for vocal hygiene. Pt response well to interventions, able to demonstrate strong, clear voicing at the sentence and conversational level with good volume. Continued of education for throat clear/cough alternatives to address ongoing cough/throat clearing. Pt evidencing understanding via demonstration ~5 by conclusion of session, using hard swallow with water, and breathing exercises. Pt continues to benefit from skilled ST.    OBJECTIVE IMPAIRMENTS: include voice disorder. These impairments are limiting patient from effectively communicating at home and in community. Factors affecting potential to achieve goals and functional outcome are  etiology . Patient will benefit from skilled SLP services to address above impairments and improve overall function.  REHAB POTENTIAL: Good  PLAN:  SLP FREQUENCY: 1x/week (SLP recommended 2x week, pt elects for reduced frequency d/t other commitments)   SLP DURATION: 6 weeks  PLANNED INTERVENTIONS: Cueing hierachy, SLP instruction and feedback, Compensatory strategies, and Patient/family education  [place], Student-SLP 09/04/2023, 3:52 PM

## 2023-09-11 ENCOUNTER — Encounter: Payer: Medicare Other | Admitting: Speech Pathology

## 2023-09-12 ENCOUNTER — Ambulatory Visit: Payer: Medicare Other | Attending: Otolaryngology | Admitting: Speech Pathology

## 2023-09-12 DIAGNOSIS — R490 Dysphonia: Secondary | ICD-10-CM | POA: Diagnosis present

## 2023-09-12 NOTE — Therapy (Signed)
 OUTPATIENT SPEECH LANGUAGE PATHOLOGY VOICE TREATMENT   Patient Name: [name] MRN: [id number] DOB:03-22-1946, 78 y.o., male Today's Date: 09/12/2023  PCP: Donita Brooks, MD REFERRING PROVIDER: Ashok Croon, MD  END OF SESSION:  End of Session - 09/12/23 1101     Visit Number 5    Number of Visits 7    Date for SLP Re-Evaluation 09/25/23    Authorization Type [employer]    SLP Start Time 1015    SLP Stop Time  1100    SLP Time Calculation (min) 45 min    Activity Tolerance Patient tolerated treatment well                Past Medical History:  Diagnosis Date   Allergy    Bacteremia due to Gram-negative bacteria 04/13/2015   Bladder rupture 2010   Bladder tumor    Cancer [facility])    bladder   Chronic kidney disease    Depression    Gout    History of unilateral nephrectomy 07/2014   [facility]   Hyperlipidemia    Restless leg syndrome, controlled    Past Surgical History:  Procedure Laterality Date   APPENDECTOMY     BLADDER SURGERY  2010   COLONOSCOPY  06/05/2009   CYSTOSCOPY W/ RETROGRADES Bilateral 04/21/2013   Procedure: CYSTOSCOPY WITH RETROGRADE PYELOGRAM;  Surgeon: Milford Cage, MD;  Location: [facility];  Service: Urology;  Laterality: Bilateral;   FRACTURE SURGERY Right 01/05/2005   lower leg/ankle   NASAL SEPTUM SURGERY     TONSILLECTOMY AND ADENOIDECTOMY     TRANSURETHRAL RESECTION OF BLADDER TUMOR WITH GYRUS (TURBT-GYRUS) N/A 04/21/2013   Procedure: TRANSURETHRAL RESECTION OF BLADDER TUMOR WITH GYRUS (TURBT-GYRUS), cystoscopy;  Surgeon: Milford Cage, MD;  Location: [facility];  Service: Urology;  Laterality: N/A;   Patient Active Problem List   Diagnosis Date Noted   Dysphonia 05/28/2023   Sensorineural hearing loss, bilateral 05/28/2023   Tinnitus of both ears 05/28/2023   Allergic conjunctivitis of right eye 08/06/2022   MRSA (methicillin resistant staph aureus) culture positive 12/27/2019   History of nephrectomy 08/24/2016   CKD  (chronic kidney disease) stage 3, GFR 30-59 ml/min (HCC) 08/24/2016   Sepsis due to Escherichia coli (E. 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    PERTINENT HISTORY: Per referring MD PN, "The patient is a 20 yoM, with a history of hearing loss, presents with a two-year history of voice changes, described as becoming 'raspy' and less loud. He denies any associated pain or complete loss of voice. He reports nocturnal coughing episodes, severe enough to cause throat discomfort, but denies any difficulty swallowing food/coughing or choking when eating or drinking. He also notes a persistent sensation of mucus in his throat. He denies any significant history of heartburn or reflux, but does have a history of allergies for which he takes Flonase. He reports no noticeable postnasal drainage. He has a remote history of smoking, having quit in 1990, and  denies any history of lung disease. 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 Examination shows thin vocal folds with incomplete closure, leading to compensatory supraglottic compression. No lesions noted. Discussed elective treatments: voice therapy, temporary VF injection augmentation  (6-12 months relief), and permanent option with thyroplasty (OR procedure).  GERD LPR Mild reflux irritation observed on scope exam today. Discussed non-pharmacological management: dietary changes and seaweed-based supplement Reflux Gourmet. Advised against omeprazole due to potential long-term side effects and uncertain benefit. - Recommend Reflux Gourmet and diet/lifestyle changes to minimize reflux - Provided after-visit summary with dietary recommendations"  PAIN:  Are you having pain? No  FALLS: Has patient fallen in last 6 months? No  LIVING ENVIRONMENT: Lives with: lives with their family and lives with their spouse Lives in: House/apartment  PLOF:Level of assistance: Independent with ADLs Employment: Part-time employment  PATIENT GOALS: "I want to able to speak in a normal tone of voice without having to raise my voice to be heard"  OBJECTIVE:  Note: Objective measures were completed at Evaluation unless otherwise noted.  TODAY'S TREATMENT:                                                                                                                                         09/12/2023: SLP continued implementing PhoRTE to target vocal fold atrophy. Pt completed exercises effectively benefiting from model and min-A, "stair stepping" glides for improved vocal quality, and intermittent cues for a clear, strong voice. SLP facilitated carryover of strong, clear voice to unstructured conversation regarding pt's interests/shows with pt participating effectively given model.   Loud "Ah" avg: 93 Glides: 79 Phrases: 80 Conversational average: 72 Re-administered PROM, completed initial therapy session. Pt demonstrates improvement. He reports understanding of voice etiology and strategies to improve vocal quality. Is pleased with current voice production with utilization of exuberant voice.       Voice Related Quality of Life Measure Pt was asked to rate on a scale of 1-5 how much of a  problem different speaking situations have been over the past two weeks      Because of my voice...  Pt Rating (eval) Pt Rating (Discharge)  I have trouble speaking loudly or being heard in noisy situations 3 2  I run out of air and need to take frequent breaths when talking 1 1  I sometimes do not know what will come out when I begin speaking 1 2  I am sometimes anxious or frustrated because of my voice 2 1  I sometimes get depressed because of my voice 1 1  I have trouble using the telephone because of my voice 2 2  I have trouble doing my job or practicing my profession because of my  voice 1 1  I avoid going out socially because of my voice 1 1  I have to repeat myself to be understood 3 2  I have become less outgoing because of my voice 1 1  Calculated score 85 90  1= none, not a problem 5= problem is as "bad as it can be"  >80 is considered WNL   09/04/23: SLP continued implementing PhoRTE to target vocal fold atrophy. Pt completed exercises effectively benefiting from min-A, "stair stepping" glides for improved vocal quality, and intermittent cues for a clear, strong voice. SLP introduced additional breathing exercises for suppression of habitual throat clear with pt demonstrating strategies and verbalizing understanding. SLP initiated carryover of strong, clear voice to unstructured conversation regarding pt's upcoming plans with pt participating effectively given mod/min-I and model.  Loud "Ah" avg: 99  Glides: 85 Phrases: 80 Conversational average: 72  08/21/23: ST initiated semi occluded vocal tract exercises (SOVTE) and phonation resonance training exercises (PhoRTE) to target presbyphonia. SLP educated patient re: semi-occluded vocal tract exercises purpose and execution to reduce laryngeal tension and promote balanced and coordinated use of respiratory and phonatory sub-systems. SLP provided modeling and feedback throughout exercises. Pt demonstrated challenges completing  exercises, demonstrating pressed resonance. Modification to occlusion via lip posture. Elected to move on to high resistance phonation exercises to avoid redundancy. Led pt through sustained phonation, pitch glides and phrases with ongoing cues for high energy, loud volume, and belly breaths. Pt averaging 78 dB with sustained phonation tasks. Vocal exercises leading to increased throat clearing and coughing. Patient education provided re: vocal abuse, cough cycle, alternatives for improved vocal health. By end of session, pt demonstrating increased awareness and utilizing water sips with hard swallow as cough alternative x3.   08/14/23: ST completed initial evaluation, provided pt education for results and recommendations. Following evaluation, SLP provided pt education for reflux precautions and effect of laryngopharyngeal reflux on voice mechanism. Pt with teach back of using reflux supplement following trigger foods and prior to bed, able to list x3 trigger foods, and verbalize via teach back of likelihood of reflux vs sensation of post nasal drainage 2/2 imaging done with ENT and reported sx.   PATIENT EDUCATION: Education details: See above Person educated: Patient Education method: Explanation Education comprehension: verbalized understanding  HOME EXERCISE PROGRAM: Will be established next session   GOALS: Goals reviewed with patient? Yes  SHORT TERM GOALS: Target date: 09/11/2023  Pt will complete demonstrate semi-occluded vocal tract exercises using belly breaths with mod-I Baseline: Goal status: MET  2.  Pt will achieve average of 75+ dB during high intensity vocal exercises with rare min-A over 2 sessions Baseline:  Goal status: PARTIALLY MET  3.  Pt will report HEP completion daily over 1 week period Baseline:  Goal status: PARTIALLY MET  4.  Pt will report implementation of vocal hygiene recommendations over 1 week period Baseline:  Goal status: MET   LONG TERM GOALS:  Target date: 09/25/2023  Pt will be independent with HEP Baseline:  Goal status: MET  2.  Pt will achieve average of 80+ dB during high intensity vocal exercises with rare min-A over 2 sessions Baseline:  Goal status: MET  3.  Pt will maintain WNL volume (70-72 dB) during 15 minute conversation  Baseline:  Goal status: MET  ASSESSMENT:  CLINICAL IMPRESSION: Patient is a 78 y.o. M who was seen today for ST tx session focusing on instruction for high energy vocal exercises and patient education for vocal hygiene. Pt  response well to interventions, able to demonstrate strong, clear voicing at the sentence and conversational level with good volume. ST reviewed HEP and importance of continuous use of strong, clear voice in therapy to combat reduced volume related to vocal fold atrophy. Pt presents with louder, clearer voice and consistent use of throat clear strategies. Pt agreeable to discharge.    OBJECTIVE IMPAIRMENTS: include voice disorder. These impairments are limiting patient from effectively communicating at home and in community. Factors affecting potential to achieve goals and functional outcome are  etiology . Patient will benefit from skilled SLP services to address above impairments and improve overall function.  REHAB POTENTIAL: Good  PLAN:  SLP FREQUENCY: 1x/week (SLP recommended 2x week, pt elects for reduced frequency d/t other commitments)   SLP DURATION: 6 weeks  PLANNED INTERVENTIONS: Cueing hierachy, SLP instruction and feedback, Compensatory strategies, and Patient/family education  Maia Breslow, CCC-SLP 09/12/2023, 11:01 AM  SPEECH THERAPY DISCHARGE SUMMARY  Visits from Start of Care: 5  Current functional level related to goals / functional outcomes: Pt presents with improved volume, and clearer, stronger voice. Pt reports carryover of exercises outside of therapy sessions.   Remaining deficits: Vocal fold atrophy   Education / Equipment: Education  provided on throat clear suppression strategies, high energy voicing techniques, vocal fold atrophy, and vocal hygiene   Patient agrees to discharge. Patient goals were met. Patient is being discharged due to meeting the stated rehab goals.[name]

## 2023-09-18 ENCOUNTER — Encounter: Payer: Medicare Other | Admitting: Speech Pathology

## 2023-09-22 ENCOUNTER — Encounter: Payer: Self-pay | Admitting: Family Medicine

## 2023-09-25 ENCOUNTER — Encounter: Payer: Medicare Other | Admitting: Speech Pathology

## 2023-10-02 ENCOUNTER — Other Ambulatory Visit

## 2023-10-02 DIAGNOSIS — N1831 Chronic kidney disease, stage 3a: Secondary | ICD-10-CM

## 2023-10-02 DIAGNOSIS — R7303 Prediabetes: Secondary | ICD-10-CM

## 2023-10-04 LAB — HEMOGLOBIN A1C
Hgb A1c MFr Bld: 6.6 %{Hb} — ABNORMAL HIGH (ref <5.7)
Mean Plasma Glucose: 143 mg/dL
eAG (mmol/L): 7.9 mmol/L

## 2023-10-04 LAB — PROTEIN / CREATININE RATIO, URINE
Creatinine, Urine: 108 mg/dL (ref 20–320)
Protein/Creat Ratio: 6380 mg/g{creat} — ABNORMAL HIGH (ref 25–148)
Protein/Creatinine Ratio: 6.38 mg/mg{creat} — ABNORMAL HIGH (ref 0.025–0.148)
Total Protein, Urine: 689 mg/dL — ABNORMAL HIGH (ref 5–25)

## 2023-10-06 ENCOUNTER — Other Ambulatory Visit: Payer: Self-pay

## 2023-10-06 DIAGNOSIS — R809 Proteinuria, unspecified: Secondary | ICD-10-CM

## 2023-10-06 DIAGNOSIS — N184 Chronic kidney disease, stage 4 (severe): Secondary | ICD-10-CM

## 2023-10-06 DIAGNOSIS — N1831 Chronic kidney disease, stage 3a: Secondary | ICD-10-CM

## 2023-10-06 MED ORDER — KERENDIA 10 MG PO TABS: 10.0000 mg | ORAL_TABLET | Freq: Every day | ORAL | 1 refills | Status: AC

## 2023-10-13 ENCOUNTER — Telehealth: Payer: Self-pay

## 2023-10-13 NOTE — Telephone Encounter (Signed)
 Copied from CRM [id number]. Topic: Clinical - Prescription Issue >> Oct 13, 2023  9:10 AM Carlatta H wrote: Reason for CRM: Please send Finerenone (KERENDIA) 10 MG TABS [[id number]] for prior authorization with [employer]//

## 2023-10-14 ENCOUNTER — Telehealth: Payer: Self-pay

## 2023-10-14 NOTE — Telephone Encounter (Signed)
 Copied from CRM [id number]. Topic: Clinical - Prescription Issue >> Oct 13, 2023  9:10 AM Carlatta H wrote: Reason for CRM: Please send Finerenone (KERENDIA) 10 MG TABS [[id number]] for prior authorization with [employer]// >> Oct 14, 2023  2:29 PM Shardie S wrote: Patient states that PA was denied by insurance. Requesting a new/different medication be sent to the [facility] on [address].

## 2023-10-16 ENCOUNTER — Telehealth: Payer: Self-pay

## 2023-10-16 NOTE — Telephone Encounter (Signed)
 Appeal faxed to [facility] at [phone] Ref# [id number]. Mjp,lpn

## 2023-10-16 NOTE — Telephone Encounter (Signed)
 Copied from CRM [id number]. Topic: Clinical - Prescription Issue >> Oct 13, 2023  9:10 AM Carlatta H wrote: Reason for CRM: Please send Finerenone (KERENDIA) 10 MG TABS [[id number]] for prior authorization with [employer]// >> Oct 16, 2023 10:20 AM Emylou G wrote: Patient calling back.. checking status of refill.[name] any update?  Weekend is coming - wife is concerned? >> Oct 14, 2023  4:33 PM Emylou G wrote: Adv patient medication still under review >> Oct 14, 2023  2:29 PM Shardie S wrote: Patient states that PA was denied by insurance. Requesting a new/different medication be sent to the [facility] on [address].

## 2023-10-23 ENCOUNTER — Other Ambulatory Visit (HOSPITAL_COMMUNITY): Payer: Self-pay | Admitting: Urology

## 2023-10-23 ENCOUNTER — Ambulatory Visit (HOSPITAL_COMMUNITY)
Admission: RE | Admit: 2023-10-23 | Discharge: 2023-10-23 | Disposition: A | Discharge status: Home / Self Care | Source: Ambulatory Visit | Attending: Urology | Admitting: Urology

## 2023-10-23 DIAGNOSIS — C678 Malignant neoplasm of overlapping sites of bladder: Secondary | ICD-10-CM | POA: Insufficient documentation

## 2023-10-23 DIAGNOSIS — C61 Malignant neoplasm of prostate: Secondary | ICD-10-CM

## 2023-10-29 ENCOUNTER — Inpatient Hospital Stay (HOSPITAL_COMMUNITY): Admission: RE | Admit: 2023-10-29 | Source: Ambulatory Visit

## 2023-11-13 ENCOUNTER — Ambulatory Visit (INDEPENDENT_AMBULATORY_CARE_PROVIDER_SITE_OTHER): Payer: Medicare Other | Admitting: Otolaryngology

## 2023-12-05 ENCOUNTER — Ambulatory Visit

## 2023-12-12 ENCOUNTER — Ambulatory Visit: Admitting: Family Medicine

## 2023-12-12 VITALS — BP 110/65 | HR 61 | Temp 98.2°F | Ht 69.0 in | Wt 212.2 lb

## 2023-12-12 DIAGNOSIS — Z0000 Encounter for general adult medical examination without abnormal findings: Secondary | ICD-10-CM

## 2023-12-12 DIAGNOSIS — M961 Postlaminectomy syndrome, not elsewhere classified: Secondary | ICD-10-CM | POA: Insufficient documentation

## 2023-12-12 DIAGNOSIS — G894 Chronic pain syndrome: Secondary | ICD-10-CM | POA: Insufficient documentation

## 2023-12-12 DIAGNOSIS — G620 Drug-induced polyneuropathy: Secondary | ICD-10-CM | POA: Insufficient documentation

## 2023-12-12 NOTE — Progress Notes (Signed)
 Subjective:   [name] is a 78 y.o. male who presents for Medicare Annual/Subsequent preventive examination.  Visit Complete: In person  Patient Medicare AWV questionnaire was completed by the patient on [id number]; I have confirmed that all information answered by patient is correct and no changes since this date.        Objective:     Today's Vitals   12/12/23 1428  Height: 5\' 9"  (1.753 m)   Body mass index is 28.35 kg/m.     08/14/2023    2:49 PM 07/02/2022    1:54 PM 03/01/2022    1:16 PM 07/03/2021    6:09 AM 07/02/2021   10:57 AM 06/21/2021    3:26 PM 01/05/2021    2:17 PM  Advanced Directives  Does Patient Have a Medical Advance Directive? Yes Yes Yes No Yes Yes Yes  Type of Special educational needs teacher of [place];Living will   Healthcare Power of [place];Living will Healthcare Power of [place];Living will Healthcare Power of [place];Living will  Does patient want to make changes to medical advance directive?   No - Patient declined    No - Patient declined  Copy of Healthcare Power of Attorney in Chart?      No - copy requested No - copy requested  Would patient like information on creating a medical advance directive?      No - Patient declined     Current Medications (verified) Outpatient Encounter Medications as of 12/12/2023  Medication Sig   allopurinol  (ZYLOPRIM ) 300 MG tablet TAKE 1 TABLET BY MOUTH DAILY   Cholecalciferol (VITAMIN D3) 1.25 MG (50000 UT) TABS    clonazePAM  (KLONOPIN ) 0.5 MG tablet Take 0.5 mg by mouth 2 (two) times daily as needed.   colchicine  0.6 MG tablet Take 0.6-1.2 mg by mouth 2 (two) times daily as needed (for gout flare).    escitalopram  (LEXAPRO ) 20 MG tablet Take 2 tablets (40 mg total) by mouth at bedtime. (Patient taking differently: Take 20 mg by mouth at bedtime.)   ezetimibe  (ZETIA ) 10 MG tablet TAKE 1 TABLET BY MOUTH IN THE  MORNING   Finerenone  (KERENDIA ) 10 MG TABS Take 1 tablet (10 mg total) by mouth daily.    fluticasone  (FLONASE ) 50 MCG/ACT nasal spray USE 2 SPRAYS IN BOTH  NOSTRILS DAILY   losartan  (COZAAR ) 50 MG tablet Take 1 tablet (50 mg total) by mouth daily.   rOPINIRole  (REQUIP ) 4 MG tablet Take 4 mg by mouth at bedtime.    rosuvastatin  (CRESTOR ) 5 MG tablet TAKE 1 TABLET BY MOUTH DAILY   traZODone  (DESYREL ) 100 MG tablet Take 50 mg by mouth daily as needed for sleep.   vitamin B-12 (CYANOCOBALAMIN) 1000 MCG tablet Take 1,000 mcg by mouth daily.   No facility-administered encounter medications on file as of 12/12/2023.    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   [facility]   Hyperlipidemia    Restless leg syndrome, controlled    Past Surgical History:  Procedure Laterality Date   APPENDECTOMY     BLADDER SURGERY  2010   COLONOSCOPY  06/05/2009   CYSTOSCOPY W/  RETROGRADES Bilateral 04/21/2013   Procedure: CYSTOSCOPY WITH RETROGRADE PYELOGRAM;  Surgeon: Soledad Dupes, MD;  Location: [facility];  Service: Urology;  Laterality: Bilateral;   FRACTURE SURGERY Right 01/05/2005   lower leg/ankle   NASAL SEPTUM SURGERY     TONSILLECTOMY AND ADENOIDECTOMY     TRANSURETHRAL RESECTION OF BLADDER TUMOR WITH GYRUS (TURBT-GYRUS) N/A 04/21/2013   Procedure: TRANSURETHRAL RESECTION OF BLADDER TUMOR WITH GYRUS (TURBT-GYRUS), cystoscopy;  Surgeon: Soledad Dupes, MD;  Location: [facility];  Service: Urology;  Laterality: N/A;   Family History  Problem Relation Age of Onset   Cancer Mother        ovarian   Cirrhosis Father    Alcohol abuse Father    Colon cancer Neg Hx    Colon polyps Neg Hx    Esophageal cancer Neg Hx    Rectal cancer Neg Hx     Stomach cancer Neg Hx    Social History   Socioeconomic History   Marital status: Married    Spouse name: Franky Ivanoff   Number of children: 2   Years of education: Not on file   Highest education level: Bachelor's degree (e.g., BA, AB, BS)  Occupational History   Not on file  Tobacco Use   Smoking status: Former    Current packs/day: 0.00    Average packs/day: 0.5 packs/day for 30.0 years (15.0 ttl pk-yrs)    Types: Cigarettes    Start date: 07/25/1959    Quit date: 07/24/1989    Years since quitting: 34.4   Smokeless tobacco: Never  Vaping Use   Vaping status: Never Used  Substance and Sexual Activity   Alcohol use: Yes    Comment: social   Drug use: No   Sexual activity: Not on file  Other Topics Concern   Not on file  Social History Narrative   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   1 son died in 12/23/16.   1 son living.   1 granddaughter.   Social Drivers of Corporate investment banker Strain: Low Risk  (12/12/2023)   Overall Financial Resource Strain (CARDIA)    Difficulty of Paying Living Expenses: Not hard at all  Food Insecurity: No Food Insecurity (12/12/2023)   Hunger Vital Sign    Worried About Running Out of Food in the Last Year: Never true    Ran Out of Food in the Last Year: Never true  Transportation Needs: No Transportation Needs (12/12/2023)   PRAPARE - Administrator, Civil Service (Medical): No    Lack of Transportation (Non-Medical): No  Physical Activity: Insufficiently Active (12/12/2023)   Exercise Vital Sign    Days of Exercise per Week: 1 day    Minutes of Exercise per Session: 20 min  Stress: No Stress Concern Present (12/12/2023)   [employer] of Occupational Health - Occupational Stress Questionnaire    Feeling of Stress : Only a little  Social Connections: Socially Integrated (12/12/2023)   Social Connection and Isolation Panel [NHANES]    Frequency of Communication with Friends and Family: More than three times a week     Frequency of Social Gatherings with Friends and Family: Twice a week    Attends Religious Services: More than 4 times per year    Active Member of [employer] or Organizations: Yes    Attends Banker Meetings: 1 to 4 times per year    Marital Status: Married    Tobacco Counseling Counseling given: Not Answered  Clinical Intake:  Pre-visit preparation completed: Yes                     Activities of Daily Living     No data to display          Patient Care Team: Austine Lefort, MD as PCP - General (Family Medicine)  Indicate any recent Medical Services you may have received from other than [facility] providers in the past year (date may be approximate).     Assessment:    This is a routine wellness examination for [place].  Hearing/Vision screen No results found.   Goals Addressed   None    Depression Screen    06/23/2023    2:50 PM 06/20/2022    2:48 PM 04/25/2022   12:26 PM 06/21/2021    3:17 PM 02/23/2021    8:11 AM 01/05/2021    1:20 PM 05/04/2020    3:04 PM  PHQ 2/9 Scores  PHQ - 2 Score 0 0 0 1 0 0 0    Fall Risk    06/23/2023    2:50 PM 06/20/2022    2:48 PM 04/25/2022   12:25 PM 06/21/2021    3:27 PM 06/21/2021    1:07 AM  Fall Risk   Falls in the past year? 0 0 0 0 0   Number falls in past yr: 0 0 0 0 0   Injury with Fall? 0 0 0 0 0   Risk for fall due to :  No Fall Risks No Fall Risks No Fall Risks   Follow up  Falls prevention discussed Falls prevention discussed Falls prevention discussed      Proxy-reported    MEDICARE RISK AT HOME:  0  TIMED UP AND GO:   Was the test performed?  Yes  Length of time to ambulate 10 feet: 10 sec Gait steady and fast with assistive device    Cognitive Function:        05/04/2020    3:06 PM  6CIT Screen  What Year? 0 points  What month? 0 points  What time? 0 points  Count back from 20 0 points  Months in reverse 0 points  Repeat phrase 0 points  Total Score 0 points     Immunizations Immunization History  Administered Date(s) Administered   Fluad Quad(high Dose 65+) 04/29/2019, 04/27/2020, 05/01/2022   Fluad Trivalent(High Dose 65+) 05/22/2023   Influenza Split 05/25/2013   Influenza,inj,Quad PF,6+ Mos 04/22/2014, 04/28/2015, 05/09/2016, 03/07/2017, 03/03/2018   PFIZER(Purple Top)SARS-COV-2 Vaccination 09/01/2019, 09/29/2019   Pneumococcal Conjugate Pcv21, Polysaccharide Crm197 Conjugaf 06/23/2023   Pneumococcal Conjugate-13 04/22/2014   Pneumococcal Polysaccharide-23 04/28/2015   Tdap 04/26/2019   Zoster Recombinant(Shingrix) 09/25/2021, 01/04/2022   Zoster, Live 01/27/2009    TDAP status: Up to date  Flu Vaccine status: Up to date  Pneumococcal vaccine status: Up to date  Covid-19 vaccine status: Completed vaccines  Qualifies for Shingles Vaccine? Yes   Zostavax completed Yes   Shingrix Completed?: Yes  Screening Tests Health Maintenance  Topic Date Due   COVID-19 Vaccine (3 - Pfizer risk series) 10/27/2019   INFLUENZA VACCINE  02/06/2024   Medicare Annual Wellness (AWV)  12/11/2024   DTaP/Tdap/Td (2 - Td or Tdap) 04/25/2029   Pneumonia Vaccine 51+ Years old  Completed   Hepatitis C Screening  Completed   Zoster Vaccines- Shingrix  Completed   HPV VACCINES  Aged Out   Meningococcal B Vaccine  Aged Out   Colonoscopy  Discontinued  Health Maintenance  Health Maintenance Due  Topic Date Due   COVID-19 Vaccine (3 - Pfizer risk series) 10/27/2019    Colorectal cancer screening: No longer required.   Lung Cancer Screening: (Low Dose CT Chest recommended if Age 77-80 years, 20 pack-year currently smoking OR have quit w/in 15years.) does not qualify.   Lung Cancer Screening Referral: no.  Additional Screening:  Hepatitis C Screening: does not qualify; Completed 03/07/2017  Vision Screening: Recommended annual ophthalmology exams for early detection of glaucoma and other disorders of the eye. Is the patient up to date  with their annual eye exam?  Yes  Who is the provider or what is the name of the office in which the patient attends annual eye exams? 03/2023 If pt is not established with a provider, would they like to be referred to a provider to establish care? No .   Dental Screening: Recommended annual dental exams for proper oral hygiene  Diabetic Foot Exam: Diabetic Foot Exam: Overdue, Pt has been advised about the importance in completing this exam. Pt is scheduled for diabetic foot exam on n/a.  Community Resource Referral / Chronic Care Management: CRR required this visit?  No   CCM required this visit?  Appt scheduled with PCP     Plan:     I have personally reviewed and noted the following in the patient's chart:   Medical and social history Use of alcohol, tobacco or illicit drugs  Current medications and supplements including opioid prescriptions. Patient is not currently taking opioid prescriptions. Functional ability and status Nutritional status Physical activity Advanced directives List of other physicians Hospitalizations, surgeries, and ER visits in previous 12 months Vitals Screenings to include cognitive, depression, and falls Referrals and appointments  In addition, I have reviewed and discussed with patient certain preventive protocols, quality metrics, and best practice recommendations. A written personalized care plan for preventive services as well as general preventive health recommendations were provided to patient.     Meriam Stamp, CMA   12/12/2023   After Visit Summary: (In Person-Printed) AVS printed and given to the patient  Nurse Notes: pt. Answered questions appropriately with nice demeanor.

## 2023-12-19 ENCOUNTER — Telehealth: Payer: Self-pay

## 2023-12-19 ENCOUNTER — Encounter: Payer: Self-pay | Admitting: Family Medicine

## 2023-12-19 ENCOUNTER — Ambulatory Visit: Admitting: Family Medicine

## 2023-12-19 VITALS — BP 119/68 | HR 68 | Temp 98.3°F | Ht 69.0 in | Wt 210.6 lb

## 2023-12-19 DIAGNOSIS — E118 Type 2 diabetes mellitus with unspecified complications: Secondary | ICD-10-CM

## 2023-12-19 DIAGNOSIS — N1832 Chronic kidney disease, stage 3b: Secondary | ICD-10-CM | POA: Diagnosis not present

## 2023-12-19 DIAGNOSIS — Z794 Long term (current) use of insulin: Secondary | ICD-10-CM

## 2023-12-19 MED ORDER — LOSARTAN POTASSIUM 50 MG PO TABS: 50.0000 mg | ORAL_TABLET | Freq: Every day | ORAL | 3 refills | Status: AC

## 2023-12-19 NOTE — Telephone Encounter (Signed)
 Copied from CRM [id number]. Topic: Clinical - Medication Prior Auth >> Dec 19, 2023  2:44 PM Ivette P wrote: Reason for CRM: Pt called in because was told he needs a PA for medication [facility] and once approved if can be sent to [facility]

## 2023-12-19 NOTE — Progress Notes (Signed)
 Subjective:    Patient ID: [name], male    DOB: 1946/02/14, 78 y.o.   MRN: [id number]  HPI Patient is a pleasant 78 y/o WM here today for follow up.  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 His bladder has been completely removed. They also removed his prostate at the same time. They did find a small focus of prostate cancer when he did this.  Recently, I added kerendia  to his losartan  due to profound proteinuria and stage IIIb chronic kidney disease.  Patient cannot take Jardiance or Farxiga due to recurrent urinary tract infections.  However the medication is costing him $600.  The patient also stopped losartan  after we addedkerendia accidentally.  Past Medical History:  Diagnosis Date   Allergy    Bacteremia due to Gram-negative bacteria 04/13/2015   Bladder rupture 2010   Bladder tumor    Cancer [facility])    bladder   Chronic kidney disease    Depression    Gout    History of unilateral nephrectomy 07/2014   [facility]   Hyperlipidemia    Neuromuscular disorder (HCC) 2020   Neuropathy   Restless leg syndrome, controlled    Past Surgical History:  Procedure Laterality Date   APPENDECTOMY     BLADDER SURGERY  2010   COLONOSCOPY  06/05/2009   CYSTOSCOPY W/ RETROGRADES Bilateral 04/21/2013   Procedure: CYSTOSCOPY WITH RETROGRADE PYELOGRAM;  Surgeon: Soledad Dupes, MD;  [facility];  Service: Urology;  Laterality: Bilateral;   FRACTURE SURGERY Right 01/05/2005   lower leg/ankle   NASAL SEPTUM SURGERY     TONSILLECTOMY AND ADENOIDECTOMY     TRANSURETHRAL RESECTION OF BLADDER TUMOR WITH GYRUS (TURBT-GYRUS) N/A 04/21/2013   Procedure: TRANSURETHRAL RESECTION OF BLADDER TUMOR WITH GYRUS (TURBT-GYRUS), cystoscopy;  Surgeon: Soledad Dupes, MD;  [facility];  Service: Urology;  Laterality: N/A;   Current Outpatient Medications on File Prior to Visit  Medication Sig Dispense Refill   allopurinol  (ZYLOPRIM ) 300 MG tablet TAKE 1 TABLET BY MOUTH DAILY 90 tablet 3    Cholecalciferol (VITAMIN D3) 1.25 MG (50000 UT) TABS      clonazePAM  (KLONOPIN ) 0.5 MG tablet Take 0.5 mg by mouth 2 (two) times daily as needed.     colchicine  0.6 MG tablet Take 0.6-1.2 mg by mouth 2 (two) times daily as needed (for gout flare).      escitalopram  (LEXAPRO ) 20 MG tablet Take 2 tablets (40 mg total) by mouth at bedtime. (Patient taking differently: Take 20 mg by mouth at bedtime.) 180 tablet 3   ezetimibe  (ZETIA ) 10 MG tablet TAKE 1 TABLET BY MOUTH IN THE  MORNING 90 tablet 3   Finerenone  (KERENDIA ) 10 MG TABS Take 1 tablet (10 mg total) by mouth daily. 90 tablet 1   fluticasone  (FLONASE ) 50 MCG/ACT nasal spray USE 2 SPRAYS IN BOTH  NOSTRILS DAILY 48 g 3   gabapentin  (NEURONTIN ) 300 MG capsule Take 300 mg by mouth 2 (two) times daily. Takes two pills at night for neuropathy     rOPINIRole  (REQUIP ) 4 MG tablet Take 4 mg by mouth at bedtime.      rosuvastatin  (CRESTOR ) 5 MG tablet TAKE 1 TABLET BY MOUTH DAILY 90 tablet 3   traZODone  (DESYREL ) 100 MG tablet Take 50 mg by mouth daily as needed for sleep.     vitamin B-12 (CYANOCOBALAMIN) 1000 MCG tablet Take 1,000 mcg by mouth daily.     No current facility-administered medications on file prior  to visit.   Allergies  Allergen Reactions   Augmentin [Amoxicillin-Pot Clavulanate] Nausea And Vomiting and Other (See Comments)    Has patient had a PCN reaction causing immediate rash, facial/tongue/throat swelling, SOB or lightheadedness with hypotension: No Has patient had a PCN reaction causing severe rash involving mucus membranes or skin necrosis: No Has patient had a PCN reaction that required hospitalization No Has patient had a PCN reaction occurring within the last 10 years: No If all of the above answers are NO, then may proceed with Cephalosporin use.   Statins Other (See Comments)    Reaction:  Muscle pain    2,4-D Dimethylamine    Amoxicillin    Clavulanic Acid    Nystatin    Social History   Socioeconomic  History   Marital status: Married    Spouse name: Franky Ivanoff   Number of children: 2   Years of education: Not on file   Highest education level: Bachelor's degree (e.g., BA, AB, BS)  Occupational History   Not on file  Tobacco Use   Smoking status: Former    Current packs/day: 0.00    Average packs/day: 0.5 packs/day for 30.0 years (15.0 ttl pk-yrs)    Types: Cigarettes    Start date: 07/25/1959    Quit date: 11/16/1989    Years since quitting: 34.1   Smokeless tobacco: Never  Vaping Use   Vaping status: Never Used  Substance and Sexual Activity   Alcohol use: Yes    Alcohol/week: 2.0 standard drinks of alcohol    Types: 2 Cans of beer per week    Comment: Social   Drug use: No   Sexual activity: Not Currently    Comment: ED,Depression medication  Other Topics Concern   Not on file  Social History Narrative   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   1 son living.   1 granddaughter.   Social Drivers of Corporate investment banker Strain: Low Risk  (12/12/2023)   Overall Financial Resource Strain (CARDIA)    Difficulty of Paying Living Expenses: Not hard at all  Food Insecurity: Patient Declined (12/12/2023)   Hunger Vital Sign    Worried About Running Out of Food in the Last Year: Patient declined    Ran Out of Food in the Last Year: Patient declined  Transportation Needs: Patient Declined (12/12/2023)   PRAPARE - Administrator, Civil Service (Medical): Patient declined    Lack of Transportation (Non-Medical): Patient declined  Physical Activity: Insufficiently Active (12/12/2023)   Exercise Vital Sign    Days of Exercise per Week: 1 day    Minutes of Exercise per Session: 20 min  Stress: No Stress Concern Present (12/12/2023)   [employer] of Occupational Health - Occupational Stress Questionnaire    Feeling of Stress : Only a little  Social Connections: Socially Integrated (12/12/2023)   Social Connection and Isolation Panel     Frequency of Communication with Friends and Family: More than three times a week    Frequency of Social Gatherings with Friends and Family: Twice a week    Attends Religious Services: More than 4 times per year    Active Member of [employer] or Organizations: Yes    Attends Banker Meetings: 1 to 4 times per year    Marital Status: Married  Catering manager Violence: Not At Risk (06/21/2021)   Humiliation, Afraid, Rape, and Kick questionnaire    Fear  of Current or Ex-Partner: No    Emotionally Abused: No    Physically Abused: No    Sexually Abused: No   Family History  Problem Relation Age of Onset   Cancer Mother        ovarian   Cirrhosis Father    Alcohol abuse Father    Alcohol abuse Maternal Uncle    Alcohol abuse Paternal Uncle    Stroke Paternal Aunt    Stroke Paternal Aunt    Alcohol abuse Maternal Uncle    Alcohol abuse Paternal Uncle    Colon cancer Neg Hx    Colon polyps Neg Hx    Esophageal cancer Neg Hx    Rectal cancer Neg Hx    Stomach cancer Neg Hx      Review of Systems  All other systems reviewed and are negative.      Objective:   Physical Exam Vitals reviewed.  Constitutional:      General: He is not in acute distress.    Appearance: He is well-developed. He is not diaphoretic.  HENT:     Head: Normocephalic and atraumatic.     Right Ear: External ear normal.     Left Ear: External ear normal.     Nose: Nose normal.     Mouth/Throat:     Pharynx: No oropharyngeal exudate.   Eyes:     General: No scleral icterus.       Right eye: No discharge.        Left eye: No discharge.     Conjunctiva/sclera: Conjunctivae normal.     Pupils: Pupils are equal, round, and reactive to light.   Neck:     Thyroid : No thyromegaly.     Vascular: No JVD.     Trachea: No tracheal deviation.   Cardiovascular:     Rate and Rhythm: Normal rate and regular rhythm.     Heart sounds: Normal heart sounds. No murmur heard.    No friction rub. No  gallop.  Pulmonary:     Effort: Pulmonary effort is normal. No respiratory distress.     Breath sounds: Normal breath sounds. No stridor. No wheezing or rales.  Chest:     Chest wall: No tenderness.  Abdominal:     General: Bowel sounds are normal. There is no distension.     Palpations: Abdomen is soft. There is no mass.     Tenderness: There is no abdominal tenderness. There is no guarding or rebound.   Musculoskeletal:        General: No tenderness or deformity. Normal range of motion.     Cervical back: Normal range of motion and neck supple.  Lymphadenopathy:     Cervical: No cervical adenopathy.   Skin:    General: Skin is warm.     Coloration: Skin is not pale.     Findings: No erythema or rash.   Neurological:     Mental Status: He is alert and oriented to person, place, and time.     Cranial Nerves: No cranial nerve deficit.     Motor: No abnormal muscle tone.     Coordination: Coordination normal.     Deep Tendon Reflexes: Reflexes are normal and symmetric.   Psychiatric:        Behavior: Behavior normal.        Thought Content: Thought content normal.        Judgment: Judgment normal.   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  Assessment & Plan:  Controlled type 2 diabetes mellitus with complication, with long-term current use of insulin (HCC) - Plan: CBC with Differential/Platelet, Comprehensive metabolic panel with GFR, Lipid panel, Microalbumin/Creatinine Ratio, Urine  CKD stage 3b, GFR 30-44 ml/min (HCC) - Plan: CBC with Differential/Platelet, Comprehensive metabolic panel with GFR, Lipid panel, Microalbumin/Creatinine Ratio, Urine Recommended the patient resume losartan  along with kerendia .  He is unable to tolerate Farxiga or Jardiance.  He is unable to afford kerendia , I would switch the patient to Ozempic due to his indication for chronic kidney disease.  We discussed this at length today

## 2023-12-20 LAB — CBC WITH DIFFERENTIAL/PLATELET
Absolute Lymphocytes: 1092 {cells}/uL (ref 850–3900)
Absolute Monocytes: 445 {cells}/uL (ref 200–950)
Basophils Absolute: 49 {cells}/uL (ref 0–200)
Basophils Relative: 0.8 %
Eosinophils Absolute: 189 {cells}/uL (ref 15–500)
Eosinophils Relative: 3.1 %
HCT: 42.6 % (ref 38.5–50.0)
Hemoglobin: 14 g/dL (ref 13.2–17.1)
MCH: 31 pg (ref 27.0–33.0)
MCHC: 32.9 g/dL (ref 32.0–36.0)
MCV: 94.2 fL (ref 80.0–100.0)
MPV: 10 fL (ref 7.5–12.5)
Monocytes Relative: 7.3 %
Neutro Abs: 4325 {cells}/uL (ref 1500–7800)
Neutrophils Relative %: 70.9 %
Platelets: 197 10*3/uL (ref 140–400)
RBC: 4.52 10*6/uL (ref 4.20–5.80)
RDW: 12.9 % (ref 11.0–15.0)
Total Lymphocyte: 17.9 %
WBC: 6.1 10*3/uL (ref 3.8–10.8)

## 2023-12-20 LAB — COMPREHENSIVE METABOLIC PANEL WITH GFR
AG Ratio: 1.5 (calc) (ref 1.0–2.5)
ALT: 18 U/L (ref 9–46)
AST: 17 U/L (ref 10–35)
Albumin: 4.2 g/dL (ref 3.6–5.1)
Alkaline phosphatase (APISO): 103 U/L (ref 35–144)
BUN/Creatinine Ratio: 17 (calc) (ref 6–22)
BUN: 32 mg/dL — ABNORMAL HIGH (ref 7–25)
CO2: 26 mmol/L (ref 20–32)
Calcium: 8.8 mg/dL (ref 8.6–10.3)
Chloride: 104 mmol/L (ref 98–110)
Creat: 1.83 mg/dL — ABNORMAL HIGH (ref 0.70–1.28)
Globulin: 2.8 g/dL (ref 1.9–3.7)
Glucose, Bld: 224 mg/dL — ABNORMAL HIGH (ref 65–99)
Potassium: 4.6 mmol/L (ref 3.5–5.3)
Sodium: 137 mmol/L (ref 135–146)
Total Bilirubin: 0.3 mg/dL (ref 0.2–1.2)
Total Protein: 7 g/dL (ref 6.1–8.1)
eGFR: 38 mL/min/{1.73_m2} — ABNORMAL LOW (ref >=60)

## 2023-12-20 LAB — LIPID PANEL
Cholesterol: 114 mg/dL (ref <200)
HDL: 45 mg/dL (ref >=40)
LDL Cholesterol (Calc): 44 mg/dL
Non-HDL Cholesterol (Calc): 69 mg/dL (ref <130)
Total CHOL/HDL Ratio: 2.5 (calc) (ref <5.0)
Triglycerides: 169 mg/dL — ABNORMAL HIGH (ref <150)

## 2023-12-20 LAB — MICROALBUMIN / CREATININE URINE RATIO
Creatinine, Urine: 151 mg/dL (ref 20–320)
Microalb Creat Ratio: 785 mg/g{creat} — ABNORMAL HIGH (ref <30)
Microalb, Ur: 118.5 mg/dL

## 2023-12-22 ENCOUNTER — Telehealth: Payer: Self-pay

## 2023-12-22 ENCOUNTER — Ambulatory Visit: Payer: Self-pay | Admitting: Family Medicine

## 2023-12-22 ENCOUNTER — Other Ambulatory Visit (HOSPITAL_COMMUNITY): Payer: Self-pay

## 2023-12-22 NOTE — Telephone Encounter (Signed)
 Copied from CRM [id number]. Topic: Clinical - Medication Prior Auth >> Dec 22, 2023 11:09 AM Alica Antu wrote: Reason for CRM: Prior auth team called in stating that patient is requesting a tier or price reduction for the medication Kerendia . Prior auth team needs diagnosis and list of failed treatment medications.   Case reference # [id number] Ph: [phone]  May you please assist.

## 2023-12-22 NOTE — Telephone Encounter (Signed)
 Left message for patient to respond to results and recommendations.

## 2023-12-22 NOTE — Telephone Encounter (Signed)
-----   Message from Austine Lefort sent at 12/22/2023  7:10 AM EDT ----- Kidney fcn is stable, I would resume his losartan  and if is unable to afford kerendia , I would switch to ozempic as we discussed.  ----- Message ----- From: Addison Holster Lab Results In Sent: 12/19/2023   4:07 PM EDT To: Austine Lefort, MD

## 2023-12-22 NOTE — Telephone Encounter (Signed)
 Pharmacy Patient Advocate Encounter   Received notification from CoverMyMeds that prior authorization for Ozempic (0.25 or 0.5 MG/DOSE) 2MG /3ML pen-injectors is required/requested.   Insurance verification completed.   The patient is insured through [facility] .   Per test claim: PA required; PA started via CoverMyMeds. KEY [id number] . Waiting for clinical questions to populate.

## 2023-12-23 ENCOUNTER — Other Ambulatory Visit: Payer: Self-pay | Admitting: Family Medicine

## 2023-12-23 ENCOUNTER — Other Ambulatory Visit: Payer: Self-pay

## 2023-12-23 DIAGNOSIS — E118 Type 2 diabetes mellitus with unspecified complications: Secondary | ICD-10-CM

## 2023-12-23 DIAGNOSIS — N1832 Chronic kidney disease, stage 3b: Secondary | ICD-10-CM

## 2023-12-23 MED ORDER — OZEMPIC (0.25 OR 0.5 MG/DOSE) 2 MG/3ML ~~LOC~~ SOPN: 0.2500 mg | PEN_INJECTOR | SUBCUTANEOUS | 1 refills | Status: DC

## 2023-12-23 NOTE — Telephone Encounter (Signed)
 PLEASE BE ADVISED Clinical questions have been answered and PA submitted.TO PLAN. PA currently Pending.

## 2023-12-24 ENCOUNTER — Other Ambulatory Visit (HOSPITAL_COMMUNITY): Payer: Self-pay

## 2023-12-24 NOTE — Telephone Encounter (Signed)
 Pharmacy Patient Advocate Encounter  Received notification from [facility] that Prior Authorization for Ozempic (0.25 or 0.5 MG/DOSE) 2MG /3ML pen-injectors has been APPROVED from 12/23/2023 to 07/07/2024 SEE APPROVAL OUTCOME  BELOW  Message from Plan Request Reference Number: [id number]. OZEMPIC INJ 2MG /3ML is approved through 07/07/2024. Your patient may now fill this prescription and it will be covered.. Authorization Expiration Date: July 07, 2024.   PA #/Case ID/Reference #: [id number]

## 2023-12-26 NOTE — Telephone Encounter (Signed)
 PA approved thru insurance. P.t advised thru phone and My chart message.

## 2023-12-31 ENCOUNTER — Other Ambulatory Visit: Payer: Self-pay | Admitting: Family Medicine

## 2023-12-31 DIAGNOSIS — E785 Hyperlipidemia, unspecified: Secondary | ICD-10-CM

## 2024-01-19 ENCOUNTER — Other Ambulatory Visit (HOSPITAL_COMMUNITY): Payer: Self-pay

## 2024-02-06 ENCOUNTER — Other Ambulatory Visit: Payer: Self-pay | Admitting: Family Medicine

## 2024-02-06 ENCOUNTER — Encounter: Payer: Self-pay | Admitting: Family Medicine

## 2024-02-06 MED ORDER — OZEMPIC (0.25 OR 0.5 MG/DOSE) 2 MG/3ML ~~LOC~~ SOPN
0.5000 mg | PEN_INJECTOR | SUBCUTANEOUS | 3 refills | Status: DC
Start: 2024-02-06 — End: 2024-04-26

## 2024-02-13 ENCOUNTER — Telehealth: Payer: Self-pay | Admitting: Family Medicine

## 2024-02-13 NOTE — Telephone Encounter (Signed)
 Copied from CRM #[id number]. Topic: General - Other >> Feb 13, 2024  3:02 PM Edsel HERO wrote: Patient called to see if he could get an email address to put on his application for an Ozempic  grant. Please advise.  Outbound call placed to speak with patient. Left message to advise patient we don't have an email address to provide.   Advised patient to call back with any additional questions or concerns.

## 2024-04-19 ENCOUNTER — Encounter: Payer: Self-pay | Admitting: Family Medicine

## 2024-04-26 ENCOUNTER — Other Ambulatory Visit: Payer: Self-pay | Admitting: Family Medicine

## 2024-05-06 ENCOUNTER — Ambulatory Visit

## 2024-05-06 DIAGNOSIS — Z23 Encounter for immunization: Secondary | ICD-10-CM

## 2024-05-24 ENCOUNTER — Other Ambulatory Visit: Payer: Self-pay | Admitting: Family Medicine

## 2024-05-27 ENCOUNTER — Other Ambulatory Visit: Payer: Self-pay | Admitting: Family Medicine

## 2024-05-27 NOTE — Telephone Encounter (Signed)
 Requested Prescriptions  Pending Prescriptions Disp Refills   rosuvastatin  (CRESTOR ) 5 MG tablet [Pharmacy Med Name: Rosuvastatin  Calcium  5 MG Oral Tablet] 90 tablet 0    Sig: TAKE 1 TABLET BY MOUTH DAILY     Cardiovascular:  Antilipid - Statins 2 Failed - 05/27/2024  1:42 PM      Failed - Cr in normal range and within 360 days    Creatinine  Date Value Ref Range Status  12/26/2015 2.3 (H) 0.7 - 1.3 mg/dL Final   Creat  Date Value Ref Range Status  12/19/2023 1.83 (H) 0.70 - 1.28 mg/dL Final   Creatinine, Urine  Date Value Ref Range Status  12/19/2023 151 20 - 320 mg/dL Final         Failed - Lipid Panel in normal range within the last 12 months    Cholesterol  Date Value Ref Range Status  12/19/2023 114 <200 mg/dL Final   LDL Cholesterol (Calc)  Date Value Ref Range Status  12/19/2023 44 mg/dL (calc) Final    Comment:    Reference range: <100 . Desirable range <100 mg/dL for primary prevention;   <70 mg/dL for patients with CHD or diabetic patients  with > or = 2 CHD risk factors. SABRA LDL-C is now calculated using the Martin-Hopkins  calculation, which is a validated novel method providing  better accuracy than the Friedewald equation in the  estimation of LDL-C.  Gladis APPLETHWAITE et al. SANDREA. 7986;689(80): 2061-2068  (http://education.[facility].com/faq/FAQ164)    HDL  Date Value Ref Range Status  12/19/2023 45 > OR = 40 mg/dL Final   Triglycerides  Date Value Ref Range Status  12/19/2023 169 (H) <150 mg/dL Final         Passed - Patient is not pregnant      Passed - Valid encounter within last 12 months    Recent Outpatient Visits           5 months ago Controlled type 2 diabetes mellitus with complication, with long-term current use of insulin (HCC)   [place] [facility] Medicine [name], Butler DASEN, MD   5 months ago Encounter for [employer] annual wellness exam   [place] [facility] Duanne Butler DASEN, MD   11 months ago  CKD (chronic kidney disease) stage 4, GFR 15-29 ml/min [facility])   [place] [facility] [name], Butler DASEN, MD   1 year ago Urinary tract infection with hematuria, site unspecified   [place] [facility] Medicine [name], Butler DASEN, MD   1 year ago Allergic conjunctivitis of right eye   [facility] [name] Medicine Kayla, Jeoffrey RAMAN, FNP               ezetimibe  (ZETIA ) 10 MG tablet [Pharmacy Med Name: Ezetimibe  10 MG Oral Tablet] 90 tablet 0    Sig: TAKE 1 TABLET BY MOUTH IN THE  MORNING     Cardiovascular:  Antilipid - Sterol Transport Inhibitors Failed - 05/27/2024  1:42 PM      Failed - Lipid Panel in normal range within the last 12 months    Cholesterol  Date Value Ref Range Status  12/19/2023 114 <200 mg/dL Final   LDL Cholesterol (Calc)  Date Value Ref Range Status  12/19/2023 44 mg/dL (calc) Final    Comment:    Reference range: <100 . Desirable range <100 mg/dL for primary prevention;   <70 mg/dL for patients with CHD or diabetic patients  with > or =  2 CHD risk factors. SABRA LDL-C is now calculated using the Martin-Hopkins  calculation, which is a validated novel method providing  better accuracy than the Friedewald equation in the  estimation of LDL-C.  Gladis APPLETHWAITE et al. SANDREA. 7986;689(80): 2061-2068  (http://education.[facility].com/faq/FAQ164)    HDL  Date Value Ref Range Status  12/19/2023 45 > OR = 40 mg/dL Final   Triglycerides  Date Value Ref Range Status  12/19/2023 169 (H) <150 mg/dL Final         Passed - AST in normal range and within 360 days    AST  Date Value Ref Range Status  12/19/2023 17 10 - 35 U/L Final  12/26/2015 16 5 - 34 U/L Final         Passed - ALT in normal range and within 360 days    ALT  Date Value Ref Range Status  12/19/2023 18 9 - 46 U/L Final  12/26/2015 17 0 - 55 U/L Final         Passed - Patient is not pregnant      Passed - Valid encounter within last 12 months     Recent Outpatient Visits           5 months ago Controlled type 2 diabetes mellitus with complication, with long-term current use of insulin (HCC)   [place] [facility] [name], Butler DASEN, MD   5 months ago Encounter for Medicare annual wellness exam   [place] [facility] Duanne Butler DASEN, MD   11 months ago CKD (chronic kidney disease) stage 4, GFR 15-29 ml/min (HCC)   [place] [facility] Medicine Duanne Butler DASEN, MD   1 year ago Urinary tract infection with hematuria, site unspecified   [place] [facility] Family Medicine Duanne Butler DASEN, MD   1 year ago Allergic conjunctivitis of right eye   [place] [facility] Medicine Kayla Jeoffrey RAMAN, FNP               allopurinol  (ZYLOPRIM ) 300 MG tablet [Pharmacy Med Name: Allopurinol  300 MG Oral Tablet] 90 tablet 3    Sig: TAKE 1 TABLET BY MOUTH DAILY     Endocrinology:  Gout Agents - allopurinol  Failed - 05/27/2024  1:42 PM      Failed - Uric Acid in normal range and within 360 days    Uric Acid, Serum  Date Value Ref Range Status  06/16/2014 10.2 (H) 4.0 - 7.8 mg/dL Final         Failed - Cr in normal range and within 360 days    Creatinine  Date Value Ref Range Status  12/26/2015 2.3 (H) 0.7 - 1.3 mg/dL Final   Creat  Date Value Ref Range Status  12/19/2023 1.83 (H) 0.70 - 1.28 mg/dL Final   Creatinine, Urine  Date Value Ref Range Status  12/19/2023 151 20 - 320 mg/dL Final         Passed - Valid encounter within last 12 months    Recent Outpatient Visits           5 months ago Controlled type 2 diabetes mellitus with complication, with long-term current use of insulin [facility])   [place] [facility] Medicine [name], Butler DASEN, MD   5 months ago Encounter for Medicare annual wellness exam   [place] [facility] Family Medicine Duanne Butler DASEN, MD   11 months ago CKD (chronic kidney disease) stage 4, GFR 15-29 ml/min (  HCC)   [facility] Medicine [name], Butler DASEN, MD   1 year ago Urinary tract infection with hematuria, site unspecified   [place] [facility] Duanne, Butler DASEN, MD   1 year ago Allergic conjunctivitis of right eye    [facility] Kayla Jeoffrey RAMAN, FNP              Passed - CBC within normal limits and completed in the last 12 months    WBC  Date Value Ref Range Status  12/19/2023 6.1 3.8 - 10.8 Thousand/uL Final   RBC  Date Value Ref Range Status  12/19/2023 4.52 4.20 - 5.80 Million/uL Final   Hemoglobin  Date Value Ref Range Status  12/19/2023 14.0 13.2 - 17.1 g/dL Final  94/71/7978 86.6 13.0 - 17.7 g/dL Final   HGB  Date Value Ref Range Status  12/26/2015 13.4 13.0 - 17.1 g/dL Final   HCT  Date Value Ref Range Status  12/19/2023 42.6 38.5 - 50.0 % Final  12/26/2015 40.9 38.4 - 49.9 % Final   Hematocrit  Date Value Ref Range Status  12/03/2019 38.0 37.5 - 51.0 % Final   MCHC  Date Value Ref Range Status  12/19/2023 32.9 32.0 - 36.0 g/dL Final    Comment:    For adults, a slight decrease in the calculated MCHC value (in the range of 30 to 32 g/dL) is most likely not clinically significant; however, it should be interpreted with caution in correlation with other red cell parameters and the patient's clinical condition.    [facility]  Date Value Ref Range Status  12/19/2023 31.0 27.0 - 33.0 pg Final   MCV  Date Value Ref Range Status  12/19/2023 94.2 80.0 - 100.0 fL Final  12/03/2019 91 79 - 97 fL Final  12/26/2015 90.5 79.3 - 98.0 fL Final   No results found for: PLTCOUNTKUC, LABPLAT, POCPLA RDW  Date Value Ref Range Status  12/19/2023 12.9 11.0 - 15.0 % Final  12/03/2019 13.3 11.6 - 15.4 % Final  12/26/2015 14.0 11.0 - 14.6 % Final

## 2024-05-27 NOTE — Telephone Encounter (Signed)
 Requested medications are due for refill today.  yes  Requested medications are on the active medications list.  yes  Last refill. 06/19/2023 #90 3 rf  Future visit scheduled.   yes  Notes to clinic.  Expired uric acid lab    Requested Prescriptions  Pending Prescriptions Disp Refills   allopurinol  (ZYLOPRIM ) 300 MG tablet [Pharmacy Med Name: Allopurinol  300 MG Oral Tablet] 90 tablet 3    Sig: TAKE 1 TABLET BY MOUTH DAILY     Endocrinology:  Gout Agents - allopurinol  Failed - 05/27/2024  1:42 PM      Failed - Uric Acid in normal range and within 360 days    Uric Acid, Serum  Date Value Ref Range Status  06/16/2014 10.2 (H) 4.0 - 7.8 mg/dL Final         Failed - Cr in normal range and within 360 days    Creatinine  Date Value Ref Range Status  12/26/2015 2.3 (H) 0.7 - 1.3 mg/dL Final   Creat  Date Value Ref Range Status  12/19/2023 1.83 (H) 0.70 - 1.28 mg/dL Final   Creatinine, Urine  Date Value Ref Range Status  12/19/2023 151 20 - 320 mg/dL Final         Passed - Valid encounter within last 12 months    Recent Outpatient Visits           5 months ago Controlled type 2 diabetes mellitus with complication, with long-term current use of insulin (HCC)   [place] [facility] [name], Butler DASEN, MD   5 months ago Encounter for [employer] annual wellness exam   [place] [facility] Medicine Duanne Butler DASEN, MD   11 months ago CKD (chronic kidney disease) stage 4, GFR 15-29 ml/min (HCC)   [place] [facility] [name], Butler DASEN, MD   1 year ago Urinary tract infection with hematuria, site unspecified   [place] [facility] Duanne Butler DASEN, MD   1 year ago Allergic conjunctivitis of right eye   [place] [facility] Family Medicine Kayla Jeoffrey RAMAN, FNP              Passed - CBC within normal limits and completed in the last 12 months    WBC  Date Value Ref Range Status  12/19/2023 6.1 3.8 -  10.8 Thousand/uL Final   RBC  Date Value Ref Range Status  12/19/2023 4.52 4.20 - 5.80 Million/uL Final   Hemoglobin  Date Value Ref Range Status  12/19/2023 14.0 13.2 - 17.1 g/dL Final  94/71/7978 86.6 13.0 - 17.7 g/dL Final   HGB  Date Value Ref Range Status  12/26/2015 13.4 13.0 - 17.1 g/dL Final   HCT  Date Value Ref Range Status  12/19/2023 42.6 38.5 - 50.0 % Final  12/26/2015 40.9 38.4 - 49.9 % Final   Hematocrit  Date Value Ref Range Status  12/03/2019 38.0 37.5 - 51.0 % Final   MCHC  Date Value Ref Range Status  12/19/2023 32.9 32.0 - 36.0 g/dL Final    Comment:    For adults, a slight decrease in the calculated MCHC value (in the range of 30 to 32 g/dL) is most likely not clinically significant; however, it should be interpreted with caution in correlation with other red cell parameters and the patient's clinical condition.    [facility]  Date Value Ref Range Status  12/19/2023 31.0 27.0 - 33.0 pg Final   MCV  Date  Value Ref Range Status  12/19/2023 94.2 80.0 - 100.0 fL Final  12/03/2019 91 79 - 97 fL Final  12/26/2015 90.5 79.3 - 98.0 fL Final   No results found for: PLTCOUNTKUC, LABPLAT, POCPLA RDW  Date Value Ref Range Status  12/19/2023 12.9 11.0 - 15.0 % Final  12/03/2019 13.3 11.6 - 15.4 % Final  12/26/2015 14.0 11.0 - 14.6 % Final         Signed Prescriptions Disp Refills   rosuvastatin  (CRESTOR ) 5 MG tablet 90 tablet 0    Sig: TAKE 1 TABLET BY MOUTH DAILY     Cardiovascular:  Antilipid - Statins 2 Failed - 05/27/2024  1:42 PM      Failed - Cr in normal range and within 360 days    Creatinine  Date Value Ref Range Status  12/26/2015 2.3 (H) 0.7 - 1.3 mg/dL Final   Creat  Date Value Ref Range Status  12/19/2023 1.83 (H) 0.70 - 1.28 mg/dL Final   Creatinine, Urine  Date Value Ref Range Status  12/19/2023 151 20 - 320 mg/dL Final         Failed - Lipid Panel in normal range within the last 12 months    Cholesterol  Date Value  Ref Range Status  12/19/2023 114 <200 mg/dL Final   LDL Cholesterol (Calc)  Date Value Ref Range Status  12/19/2023 44 mg/dL (calc) Final    Comment:    Reference range: <100 . Desirable range <100 mg/dL for primary prevention;   <70 mg/dL for patients with CHD or diabetic patients  with > or = 2 CHD risk factors. SABRA LDL-C is now calculated using the Martin-Hopkins  calculation, which is a validated novel method providing  better accuracy than the Friedewald equation in the  estimation of LDL-C.  Gladis APPLETHWAITE et al. SANDREA. 7986;689(80): 2061-2068  (http://education.[facility].com/faq/FAQ164)    HDL  Date Value Ref Range Status  12/19/2023 45 > OR = 40 mg/dL Final   Triglycerides  Date Value Ref Range Status  12/19/2023 169 (H) <150 mg/dL Final         Passed - Patient is not pregnant      Passed - Valid encounter within last 12 months    Recent Outpatient Visits           5 months ago Controlled type 2 diabetes mellitus with complication, with long-term current use of insulin (HCC)   [place] [facility] Medicine [name], Butler DASEN, MD   5 months ago Encounter for [employer] annual wellness exam   [place] Medicine Duanne Butler DASEN, MD   11 months ago CKD (chronic kidney disease) stage 4, GFR 15-29 ml/min [place])   [place] [facility] [name], Butler DASEN, MD   1 year ago Urinary tract infection with hematuria, site unspecified   [place] [facility] Medicine [name], Butler DASEN, MD   1 year ago Allergic conjunctivitis of right eye   [place] [facility] Medicine Kayla Gauze S, FNP               ezetimibe  (ZETIA ) 10 MG tablet 90 tablet 0    Sig: TAKE 1 TABLET BY MOUTH IN THE  MORNING     Cardiovascular:  Antilipid - Sterol Transport Inhibitors Failed - 05/27/2024  1:42 PM      Failed - Lipid Panel in normal range within the last 12 months    Cholesterol  Date Value Ref Range Status  12/19/2023 114 <200 mg/dL Final   LDL Cholesterol (Calc)  Date Value Ref Range Status  12/19/2023 44 mg/dL (calc) Final    Comment:    Reference range: <100 . Desirable range <100 mg/dL for primary prevention;   <70 mg/dL for patients with CHD or diabetic patients  with > or = 2 CHD risk factors. SABRA LDL-C is now calculated using the Martin-Hopkins  calculation, which is a validated novel method providing  better accuracy than the Friedewald equation in the  estimation of LDL-C.  Gladis APPLETHWAITE et al. SANDREA. 7986;689(80): 2061-2068  (http://education.[facility].com/faq/FAQ164)    HDL  Date Value Ref Range Status  12/19/2023 45 > OR = 40 mg/dL Final   Triglycerides  Date Value Ref Range Status  12/19/2023 169 (H) <150 mg/dL Final         Passed - AST in normal range and within 360 days    AST  Date Value Ref Range Status  12/19/2023 17 10 - 35 U/L Final  12/26/2015 16 5 - 34 U/L Final         Passed - ALT in normal range and within 360 days    ALT  Date Value Ref Range Status  12/19/2023 18 9 - 46 U/L Final  12/26/2015 17 0 - 55 U/L Final         Passed - Patient is not pregnant      Passed - Valid encounter within last 12 months    Recent Outpatient Visits           5 months ago Controlled type 2 diabetes mellitus with complication, with long-term current use of insulin (HCC)   [place] [facility] Family Medicine [name], Butler DASEN, MD   5 months ago Encounter for Medicare annual wellness exam   [place] [facility] Medicine Duanne Butler DASEN, MD   11 months ago CKD (chronic kidney disease) stage 4, GFR 15-29 ml/min [facility])   [place] [facility] [facility] Family Medicine Duanne Butler DASEN, MD   1 year ago Urinary tract infection with hematuria, site unspecified   [place] Medicine [name], Butler DASEN, MD   1 year ago Allergic conjunctivitis of right eye    [place] [facility] Family Medicine Kayla Jeoffrey RAMAN, FNP

## 2024-05-27 NOTE — Telephone Encounter (Signed)
 Copied from CRM #[id number]. Topic: Clinical - Medication Refill >> May 27, 2024  9:30 AM Antony RAMAN wrote: Medication:  rosuvastatin  (CRESTOR ) 5 MG tablet  allopurinol  (ZYLOPRIM ) 300 MG tablet   ezetimibe  (ZETIA ) 10 MG tablet   Has the patient contacted their pharmacy? Yes (Agent: If no, request that the patient contact the pharmacy for the refill. If patient does not wish to contact the pharmacy document the reason why and proceed with request.) (Agent: If yes, when and what did the pharmacy advise?)  This is the patient's preferred pharmacy:  [facility] - [place], [place] - [address] [place] [address] [place] [address] Phone: [phone] Fax: [phone]   Is this the correct pharmacy for this prescription? Yes If no, delete pharmacy and type the correct one.   Has the prescription been filled recently? No  Is the patient out of the medication? Yes  Has the patient been seen for an appointment in the last year OR does the patient have an upcoming appointment? Yes  Can we respond through MyChart? No  Agent: Please be advised that Rx refills may take up to 3 business days. We ask that you follow-up with your pharmacy.

## 2024-05-28 NOTE — Telephone Encounter (Signed)
 Requested Prescriptions  Refused Prescriptions Disp Refills   ezetimibe  (ZETIA ) 10 MG tablet 90 tablet 3    Sig: Take 1 tablet (10 mg total) by mouth every morning.     Cardiovascular:  Antilipid - Sterol Transport Inhibitors Failed - 05/28/2024  5:33 PM      Failed - Lipid Panel in normal range within the last 12 months    Cholesterol  Date Value Ref Range Status  12/19/2023 114 <200 mg/dL Final   LDL Cholesterol (Calc)  Date Value Ref Range Status  12/19/2023 44 mg/dL (calc) Final    Comment:    Reference range: <100 . Desirable range <100 mg/dL for primary prevention;   <70 mg/dL for patients with CHD or diabetic patients  with > or = 2 CHD risk factors. SABRA LDL-C is now calculated using the Martin-Hopkins  calculation, which is a validated novel method providing  better accuracy than the Friedewald equation in the  estimation of LDL-C.  Gladis APPLETHWAITE et al. SANDREA. 7986;689(80): 2061-2068  (http://education.[facility].com/faq/FAQ164)    HDL  Date Value Ref Range Status  12/19/2023 45 > OR = 40 mg/dL Final   Triglycerides  Date Value Ref Range Status  12/19/2023 169 (H) <150 mg/dL Final         Passed - AST in normal range and within 360 days    AST  Date Value Ref Range Status  12/19/2023 17 10 - 35 U/L Final  12/26/2015 16 5 - 34 U/L Final         Passed - ALT in normal range and within 360 days    ALT  Date Value Ref Range Status  12/19/2023 18 9 - 46 U/L Final  12/26/2015 17 0 - 55 U/L Final         Passed - Patient is not pregnant      Passed - Valid encounter within last 12 months    Recent Outpatient Visits           5 months ago Controlled type 2 diabetes mellitus with complication, with long-term current use of insulin (HCC)   [facility] [name], Butler DASEN, MD   5 months ago Encounter for [employer] annual wellness exam   [place] [facility] Duanne Butler DASEN, MD   11 months ago CKD (chronic  kidney disease) stage 4, GFR 15-29 ml/min [facility])   [place] [facility] [name], Butler DASEN, MD   1 year ago Urinary tract infection with hematuria, site unspecified   [place] [facility] Duanne, Butler DASEN, MD   1 year ago Allergic conjunctivitis of right eye   [place] [facility] Kayla Jeoffrey RAMAN, FNP               rosuvastatin  (CRESTOR ) 5 MG tablet 90 tablet 3    Sig: Take 1 tablet (5 mg total) by mouth daily.     Cardiovascular:  Antilipid - Statins 2 Failed - 05/28/2024  5:33 PM      Failed - Cr in normal range and within 360 days    Creatinine  Date Value Ref Range Status  12/26/2015 2.3 (H) 0.7 - 1.3 mg/dL Final   Creat  Date Value Ref Range Status  12/19/2023 1.83 (H) 0.70 - 1.28 mg/dL Final   Creatinine, Urine  Date Value Ref Range Status  12/19/2023 151 20 - 320 mg/dL Final         Failed - Lipid  Panel in normal range within the last 12 months    Cholesterol  Date Value Ref Range Status  12/19/2023 114 <200 mg/dL Final   LDL Cholesterol (Calc)  Date Value Ref Range Status  12/19/2023 44 mg/dL (calc) Final    Comment:    Reference range: <100 . Desirable range <100 mg/dL for primary prevention;   <70 mg/dL for patients with CHD or diabetic patients  with > or = 2 CHD risk factors. SABRA LDL-C is now calculated using the Martin-Hopkins  calculation, which is a validated novel method providing  better accuracy than the Friedewald equation in the  estimation of LDL-C.  Gladis APPLETHWAITE et al. SANDREA. 7986;689(80): 2061-2068  (http://education.[facility].com/faq/FAQ164)    HDL  Date Value Ref Range Status  12/19/2023 45 > OR = 40 mg/dL Final   Triglycerides  Date Value Ref Range Status  12/19/2023 169 (H) <150 mg/dL Final         Passed - Patient is not pregnant      Passed - Valid encounter within last 12 months    Recent Outpatient Visits           5 months ago Controlled type 2 diabetes  mellitus with complication, with long-term current use of insulin (HCC)   [place] [facility] [name], Butler DASEN, MD   5 months ago Encounter for Medicare annual wellness exam   [place] [facility] Duanne Butler DASEN, MD   11 months ago CKD (chronic kidney disease) stage 4, GFR 15-29 ml/min [facility])   [place] [facility] [name], Butler DASEN, MD   1 year ago Urinary tract infection with hematuria, site unspecified   [place] [facility] Duanne, Butler DASEN, MD   1 year ago Allergic conjunctivitis of right eye   [place] [facility] Kayla Jeoffrey RAMAN, FNP               allopurinol  (ZYLOPRIM ) 300 MG tablet 90 tablet 3    Sig: Take 1 tablet (300 mg total) by mouth daily.     Endocrinology:  Gout Agents - allopurinol  Failed - 05/28/2024  5:33 PM      Failed - Uric Acid in normal range and within 360 days    Uric Acid, Serum  Date Value Ref Range Status  06/16/2014 10.2 (H) 4.0 - 7.8 mg/dL Final         Failed - Cr in normal range and within 360 days    Creatinine  Date Value Ref Range Status  12/26/2015 2.3 (H) 0.7 - 1.3 mg/dL Final   Creat  Date Value Ref Range Status  12/19/2023 1.83 (H) 0.70 - 1.28 mg/dL Final   Creatinine, Urine  Date Value Ref Range Status  12/19/2023 151 20 - 320 mg/dL Final         Passed - Valid encounter within last 12 months    Recent Outpatient Visits           5 months ago Controlled type 2 diabetes mellitus with complication, with long-term current use of insulin (HCC)   [place] [facility] [name], Butler DASEN, MD   5 months ago Encounter for Medicare annual wellness exam   [place] [facility] Duanne Butler DASEN, MD   11 months ago CKD (chronic kidney disease) stage 4, GFR 15-29 ml/min [facility])   [place] [facility] [name], Butler DASEN, MD   1 year  ago Urinary tract infection with hematuria, site  unspecified   [place] [facility] [name], Butler DASEN, MD   1 year ago Allergic conjunctivitis of right eye   [place] [facility] Kayla Jeoffrey RAMAN, FNP              Passed - CBC within normal limits and completed in the last 12 months    WBC  Date Value Ref Range Status  12/19/2023 6.1 3.8 - 10.8 Thousand/uL Final   RBC  Date Value Ref Range Status  12/19/2023 4.52 4.20 - 5.80 Million/uL Final   Hemoglobin  Date Value Ref Range Status  12/19/2023 14.0 13.2 - 17.1 g/dL Final  94/71/7978 86.6 13.0 - 17.7 g/dL Final   HGB  Date Value Ref Range Status  12/26/2015 13.4 13.0 - 17.1 g/dL Final   HCT  Date Value Ref Range Status  12/19/2023 42.6 38.5 - 50.0 % Final  12/26/2015 40.9 38.4 - 49.9 % Final   Hematocrit  Date Value Ref Range Status  12/03/2019 38.0 37.5 - 51.0 % Final   MCHC  Date Value Ref Range Status  12/19/2023 32.9 32.0 - 36.0 g/dL Final    Comment:    For adults, a slight decrease in the calculated MCHC value (in the range of 30 to 32 g/dL) is most likely not clinically significant; however, it should be interpreted with caution in correlation with other red cell parameters and the patient's clinical condition.    [facility]  Date Value Ref Range Status  12/19/2023 31.0 27.0 - 33.0 pg Final   MCV  Date Value Ref Range Status  12/19/2023 94.2 80.0 - 100.0 fL Final  12/03/2019 91 79 - 97 fL Final  12/26/2015 90.5 79.3 - 98.0 fL Final   No results found for: PLTCOUNTKUC, LABPLAT, POCPLA RDW  Date Value Ref Range Status  12/19/2023 12.9 11.0 - 15.0 % Final  12/03/2019 13.3 11.6 - 15.4 % Final  12/26/2015 14.0 11.0 - 14.6 % Final

## 2024-06-09 ENCOUNTER — Ambulatory Visit: Admitting: Pulmonary Disease

## 2024-06-09 ENCOUNTER — Encounter: Payer: Self-pay | Admitting: Pulmonary Disease

## 2024-06-09 VITALS — BP 101/67 | HR 67 | Temp 97.4°F | Ht 68.0 in | Wt 206.0 lb

## 2024-06-09 DIAGNOSIS — R0600 Dyspnea, unspecified: Secondary | ICD-10-CM

## 2024-06-09 DIAGNOSIS — R0689 Other abnormalities of breathing: Secondary | ICD-10-CM

## 2024-06-09 NOTE — Progress Notes (Addendum)
 "              [name]    [id number]    05/16/1946  Primary Care Physician:[name], Butler DASEN, MD  Referring Physician: Duanne Butler DASEN, MD [address] [place],  [place] [address]  Chief complaint: Consult for dyspnea  HPI: 78 y.o. 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  Discussed the use of AI scribe software for clinical note transcription with the patient, who gave verbal consent to proceed.  History of Present Illness [name] is a 78 year old male who presents with shortness of breath when bending over.  Dyspnea - Shortness of breath present for approximately one year, primarily occurring when stooping or kneeling (e.g., while planting) - Brief dyspnea upon standing, which resolves after a short rest - No dyspnea with walking, climbing stairs, or other upright activity - No use of inhalers - No worsening of dyspnea with exertion  Cough and sputum production - Chronic cough with stable mucus production - No typical symptoms of gastroesophageal reflux disease, though he wonders if cough could be related to reflux  Physical activity and weight changes - Intentional weight loss of approximately eight pounds while on Ozempic  - Decreased physical activity over the past year since discontinuing regular walking  Relevant medical history - History of bladder cancer and prostate removal approximately ten years ago - Prediabetes - Solitary kidney  Relevant Pulmonary history: Pets: Dog Occupation: Exposures: No mold, hardwood, Jacuzzi.  No feather pillows or comforters No h/o chemo/XRT/amiodarone/macrodantin/MTX  No exposure to asbestos, silica or other organic allergens  Smoking  history: 15-pack-year smoker.  Quit in 1991 Travel history: Previously lived in [place] in [place] .  No significant recent travel Family history: Grandmother had asthma   Outpatient Encounter Medications as of 06/09/2024  Medication Sig   allopurinol  (ZYLOPRIM ) 300 MG tablet TAKE 1 TABLET BY MOUTH DAILY   Cholecalciferol (VITAMIN D3) 1.25 MG (50000 UT) TABS    clonazePAM  (KLONOPIN ) 0.5 MG tablet Take 0.5 mg by mouth 2 (two) times daily as needed.   colchicine  0.6 MG tablet Take 0.6-1.2 mg by mouth 2 (two) times daily as needed (for gout flare).    escitalopram  (LEXAPRO ) 20 MG tablet Take 2 tablets (40 mg total) by mouth at bedtime.   ezetimibe  (ZETIA ) 10 MG tablet TAKE 1 TABLET BY MOUTH IN THE  MORNING   fluticasone  (FLONASE ) 50 MCG/ACT nasal spray USE 2 SPRAYS IN BOTH  NOSTRILS DAILY   gabapentin  (NEURONTIN ) 300 MG capsule Take 300 mg by mouth 2 (two) times daily. Takes two pills at night for neuropathy   losartan  (COZAAR ) 50 MG tablet Take 1 tablet (50 mg total) by mouth daily.   rOPINIRole  (REQUIP ) 4 MG tablet Take 4 mg by mouth at bedtime.    rosuvastatin  (CRESTOR ) 5 MG tablet TAKE 1 TABLET BY MOUTH DAILY   Semaglutide ,0.25 or 0.5MG /DOS, (OZEMPIC , 0.25 OR 0.5 MG/DOSE,) 2 MG/3ML SOPN INJECT 0.25MG  INTO THE SKIN ONE TIME PER WEEK   traZODone  (DESYREL ) 100 MG tablet Take 50 mg by mouth daily as needed for sleep.   vitamin B-12 (CYANOCOBALAMIN) 1000 MCG tablet Take 1,000 mcg by mouth daily.   Finerenone  (KERENDIA ) 10 MG TABS Take 1 tablet (10 mg total) by mouth daily. (Patient not  taking: Reported on 06/09/2024)   No facility-administered encounter medications on file as of 06/09/2024.     Physical Exam:  Today's Vitals   06/09/24 1434  BP: 101/67  Pulse: 67  Temp: (!) 97.4 F (36.3 C)  TempSrc: Oral  SpO2: 97%  Weight: 206 lb (93.4 kg)  Height: 5' 8 (1.727 m)   Body mass index is 31.32 kg/m.  Physical Exam GEN: No acute distress CV: Regular rate and rhythm no  murmurs LUNGS: Clear to auscultation bilaterally normal respiratory effort SKIN JOINTS: Warm and dry no rash    Data Reviewed: Imaging: Chest x-ray 10/23/2023-mild scarring in the left lower lobe.  No acute findings CT abdomen pelvis 10/31/2023-left basilar lung nodule stable compared to 2010.   Visualized lungs are clear.  I have reviewed the images personally.  PFTs:  Labs:  Assessment and Plan Assessment & Plan Exertional dyspnea Intermittent dyspnea primarily when bending over, not during regular exertion. Symptoms have been stable for about a year. The patient reports some mucus build-up and cough, but it is unclear whether this is due to the lungs or acid reflux. Differential includes mechanical restriction due to bending, lack of exercise, or potential lung pathology. Previous imaging shows stable lung nodules, likely benign. No evidence of COPD or asthma based on history and symptoms. Lack of exercise is a contributing factor. - Ordered chest CT to evaluate for any underlying lung pathology. - Encouraged resumption of regular exercise to improve cardiovascular fitness. - If CT is abnormal, will consider pulmonary function tests.  Stable pulmonary nodules Small lung nodules have been stable since 2010, likely benign. No new symptoms or changes in nodules. Previous imaging, including recent CT in April, shows no significant changes.  Recommendations: High-res CT  I personally spent a total of 45 minutes in the care of the patient today including preparing to see the patient, getting/reviewing separately obtained history, performing a medically appropriate exam/evaluation, independently interpreting results, communicating results, and coordinating care.   Lonna Coder MD [facility] 06/09/2024, 2:49 PM  CC: Duanne Butler DASEN, MD   "

## 2024-06-09 NOTE — Patient Instructions (Signed)
  VISIT SUMMARY: You visited us  today due to experiencing shortness of breath when bending over. We discussed your symptoms, including your chronic cough and mucus production, and reviewed your medical history and recent imaging results.  YOUR PLAN: EXERTIONAL DYSPNEA: You have been experiencing shortness of breath primarily when bending over, which has been stable for about a year. This does not occur during regular exertion. -We have ordered a chest CT to check for any underlying lung issues. -Please resume regular exercise to improve your cardiovascular fitness. -If the chest CT is normal, we may not need to do further pulmonary function tests.  STABLE PULMONARY NODULES: You have small lung nodules that have been stable since 2010 and are likely benign. Recent imaging shows no significant changes. -No new action is needed at this time since the nodules are stable and not causing new symptoms.                                 Contains text generated by [employer].

## 2024-06-18 ENCOUNTER — Other Ambulatory Visit

## 2024-06-18 DIAGNOSIS — E118 Type 2 diabetes mellitus with unspecified complications: Secondary | ICD-10-CM

## 2024-06-18 DIAGNOSIS — Z125 Encounter for screening for malignant neoplasm of prostate: Secondary | ICD-10-CM

## 2024-06-18 DIAGNOSIS — E7849 Other hyperlipidemia: Secondary | ICD-10-CM

## 2024-06-18 DIAGNOSIS — Z0000 Encounter for general adult medical examination without abnormal findings: Secondary | ICD-10-CM

## 2024-06-18 DIAGNOSIS — N1832 Chronic kidney disease, stage 3b: Secondary | ICD-10-CM

## 2024-06-21 ENCOUNTER — Inpatient Hospital Stay: Admission: RE | Admit: 2024-06-21 | Discharge: 2024-06-21 | Attending: Pulmonary Disease

## 2024-06-21 ENCOUNTER — Ambulatory Visit: Payer: Self-pay | Admitting: Family Medicine

## 2024-06-21 DIAGNOSIS — R0600 Dyspnea, unspecified: Secondary | ICD-10-CM

## 2024-06-21 LAB — COMPLETE METABOLIC PANEL WITHOUT GFR
AG Ratio: 1.4 (calc) (ref 1.0–2.5)
ALT: 21 U/L (ref 9–46)
AST: 21 U/L (ref 10–35)
Albumin: 4.1 g/dL (ref 3.6–5.1)
Alkaline phosphatase (APISO): 126 U/L (ref 35–144)
BUN/Creatinine Ratio: 14 (calc) (ref 6–22)
BUN: 31 mg/dL — ABNORMAL HIGH (ref 7–25)
CO2: 28 mmol/L (ref 20–32)
Calcium: 9.5 mg/dL (ref 8.6–10.3)
Chloride: 102 mmol/L (ref 98–110)
Creat: 2.17 mg/dL — ABNORMAL HIGH (ref 0.70–1.28)
Globulin: 2.9 g/dL (ref 1.9–3.7)
Glucose, Bld: 103 mg/dL — ABNORMAL HIGH (ref 65–99)
Potassium: 5 mmol/L (ref 3.5–5.3)
Sodium: 137 mmol/L (ref 135–146)
Total Bilirubin: 0.3 mg/dL (ref 0.2–1.2)
Total Protein: 7 g/dL (ref 6.1–8.1)

## 2024-06-21 LAB — CBC WITH DIFFERENTIAL/PLATELET
Absolute Lymphocytes: 1763 {cells}/uL (ref 850–3900)
Absolute Monocytes: 646 {cells}/uL (ref 200–950)
Basophils Absolute: 53 {cells}/uL (ref 0–200)
Basophils Relative: 0.7 %
Eosinophils Absolute: 175 {cells}/uL (ref 15–500)
Eosinophils Relative: 2.3 %
HCT: 43.8 % (ref 39.4–51.1)
Hemoglobin: 14.1 g/dL (ref 13.2–17.1)
MCH: 30.3 pg (ref 27.0–33.0)
MCHC: 32.2 g/dL (ref 31.6–35.4)
MCV: 94 fL (ref 81.4–101.7)
MPV: 9.7 fL (ref 7.5–12.5)
Monocytes Relative: 8.5 %
Neutro Abs: 4963 {cells}/uL (ref 1500–7800)
Neutrophils Relative %: 65.3 %
Platelets: 233 Thousand/uL (ref 140–400)
RBC: 4.66 Million/uL (ref 4.20–5.80)
RDW: 13.3 % (ref 11.0–15.0)
Total Lymphocyte: 23.2 %
WBC: 7.6 Thousand/uL (ref 3.8–10.8)

## 2024-06-21 LAB — PSA: PSA: <0.04 ng/mL (ref <=4.00)

## 2024-06-21 LAB — LIPID PANEL
Cholesterol: 97 mg/dL (ref <200)
HDL: 40 mg/dL (ref >=40)
LDL Cholesterol (Calc): 29 mg/dL
Non-HDL Cholesterol (Calc): 57 mg/dL (ref <130)
Total CHOL/HDL Ratio: 2.4 (calc) (ref <5.0)
Triglycerides: 210 mg/dL — ABNORMAL HIGH (ref <150)

## 2024-06-21 LAB — HEMOGLOBIN A1C
Hgb A1c MFr Bld: 6.3 % — ABNORMAL HIGH (ref <5.7)
Mean Plasma Glucose: 134 mg/dL
eAG (mmol/L): 7.4 mmol/L

## 2024-06-22 ENCOUNTER — Other Ambulatory Visit (HOSPITAL_COMMUNITY): Payer: Self-pay

## 2024-06-24 ENCOUNTER — Ambulatory Visit (INDEPENDENT_AMBULATORY_CARE_PROVIDER_SITE_OTHER): Admitting: Family Medicine

## 2024-06-24 VITALS — BP 130/62 | HR 67 | Ht 68.0 in | Wt 205.0 lb

## 2024-06-24 DIAGNOSIS — N1832 Chronic kidney disease, stage 3b: Secondary | ICD-10-CM

## 2024-06-24 DIAGNOSIS — E1122 Type 2 diabetes mellitus with diabetic chronic kidney disease: Secondary | ICD-10-CM

## 2024-06-24 DIAGNOSIS — E785 Hyperlipidemia, unspecified: Secondary | ICD-10-CM | POA: Diagnosis not present

## 2024-06-24 DIAGNOSIS — Z0001 Encounter for general adult medical examination with abnormal findings: Secondary | ICD-10-CM | POA: Diagnosis not present

## 2024-06-24 DIAGNOSIS — Z7985 Long-term (current) use of injectable non-insulin antidiabetic drugs: Secondary | ICD-10-CM | POA: Diagnosis not present

## 2024-06-24 DIAGNOSIS — Z0000 Encounter for general adult medical examination without abnormal findings: Secondary | ICD-10-CM

## 2024-06-24 NOTE — Progress Notes (Signed)
 Subjective:    Patient ID: [name], male    DOB: [birth year], 78 y.o.   MRN: [id number]  HPI Patient is a pleasant 78 y/o WM here today for complete physical exam.  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 His bladder has been completely removed. They also removed his prostate at the same time. They did find a small focus of prostate cancer when he did this.  He had a colonoscopy in 2020 that was clear.  Therefore he does not require colon cancer screening.  Patient is due for the COVID-vaccine as well as the RSV vaccine.  His flu shot, pneumonia shot, and shingles shot are up-to-date.  He defers immunizations today.  His PSA was undetectable.  His most recent PSA was less than 0.04.  He recently saw pulmonology who did a high-resolution CT scan of the chest for shortness of breath.  The shortness of breath primarily occurs when he is bending over working in the garden.  He will stand back up and be very short of breath.  However when he walks and simply doing activity around the home he is not short of breath with activity.  He denies any chest pain.  CT scan did show coronary artery calcifications but he denies any angina.  His creatinine was mildly elevated although over the last 6 years his creatinine has fluctuated between 1.7 and 2.4.  It is important remember that he has had a nephrectomy  Immunization History  Administered Date(s) Administered   Fluad Quad(high Dose 65+) 04/29/2019, 04/27/2020, 05/01/2022   Fluad Trivalent(High Dose 65+) 05/22/2023   INFLUENZA, HIGH DOSE SEASONAL PF 05/06/2024   Influenza Split 05/25/2013   Influenza,inj,Quad PF,6+ Mos 04/22/2014, 04/28/2015, 05/09/2016, 03/07/2017, 03/03/2018   PFIZER(Purple Top)SARS-COV-2 Vaccination 09/01/2019, 09/29/2019   Pneumococcal Conjugate Pcv21, Polysaccharide Crm197 Conjugaf 06/23/2023   Pneumococcal Conjugate-13 04/22/2014   Pneumococcal Polysaccharide-23 04/28/2015   Tdap 04/26/2019   Zoster Recombinant(Shingrix)  09/25/2021, 01/04/2022   Zoster, Live 01/27/2009   Lab on 06/18/2024  Component Date Value Ref Range Status   WBC 06/18/2024 7.6  3.8 - 10.8 Thousand/uL Final   RBC 06/18/2024 4.66  4.20 - 5.80 Million/uL Final   Hemoglobin 06/18/2024 14.1  13.2 - 17.1 g/dL Final   HCT 87/87/7974 43.8  39.4 - 51.1 % Final   MCV 06/18/2024 94.0  81.4 - 101.7 fL Final   MCH 06/18/2024 30.3  27.0 - 33.0 pg Final   MCHC 06/18/2024 32.2  31.6 - 35.4 g/dL Final   RDW 87/87/7974 13.3  11.0 - 15.0 % Final   Platelets 06/18/2024 233  140 - 400 Thousand/uL Final   MPV 06/18/2024 9.7  7.5 - 12.5 fL Final   Neutro Abs 06/18/2024 4,963  1,500 - 7,800 cells/uL Final   Absolute Lymphocytes 06/18/2024 1,763  850 - 3,900 cells/uL Final   Absolute Monocytes 06/18/2024 646  200 - 950 cells/uL Final   Eosinophils Absolute 06/18/2024 175  15 - 500 cells/uL Final   Basophils Absolute 06/18/2024 53  0 - 200 cells/uL Final   Neutrophils Relative % 06/18/2024 65.3  % Final   Total Lymphocyte 06/18/2024 23.2  % Final   Monocytes Relative 06/18/2024 8.5  % Final   Eosinophils Relative 06/18/2024 2.3  % Final   Basophils Relative 06/18/2024 0.7  % Final   Glucose, Bld 06/18/2024 103 (H)  65 - 99 mg/dL Final   Comment: .            Fasting reference interval .  For someone without known diabetes, a glucose value between 100 and 125 mg/dL is consistent with prediabetes and should be confirmed with a follow-up test. .    BUN 06/18/2024 31 (H)  7 - 25 mg/dL Final   Creat 87/87/7974 2.17 (H)  0.70 - 1.28 mg/dL Final   BUN/Creatinine Ratio 06/18/2024 14  6 - 22 (calc) Final   Sodium 06/18/2024 137  135 - 146 mmol/L Final   Potassium 06/18/2024 5.0  3.5 - 5.3 mmol/L Final   Chloride 06/18/2024 102  98 - 110 mmol/L Final   CO2 06/18/2024 28  20 - 32 mmol/L Final   Calcium  06/18/2024 9.5  8.6 - 10.3 mg/dL Final   Total Protein 87/87/7974 7.0  6.1 - 8.1 g/dL Final   Albumin 87/87/7974 4.1  3.6 - 5.1 g/dL Final   Globulin  87/87/7974 2.9  1.9 - 3.7 g/dL (calc) Final   AG Ratio 06/18/2024 1.4  1.0 - 2.5 (calc) Final   Total Bilirubin 06/18/2024 0.3  0.2 - 1.2 mg/dL Final   Alkaline phosphatase (APISO) 06/18/2024 126  35 - 144 U/L Final   AST 06/18/2024 21  10 - 35 U/L Final   ALT 06/18/2024 21  9 - 46 U/L Final   Cholesterol 06/18/2024 97  <200 mg/dL Final   HDL 87/87/7974 40  > OR = 40 mg/dL Final   Triglycerides 87/87/7974 210 (H)  <150 mg/dL Final   Comment: . If a non-fasting specimen was collected, consider repeat triglyceride testing on a fasting specimen if clinically indicated.  Veatrice et al. J. of Clin. Lipidol. 2015;9:129-169. SABRA    LDL Cholesterol (Calc) 06/18/2024 29  mg/dL (calc) Final   Comment: Reference range: <100 . Desirable range <100 mg/dL for primary prevention;   <70 mg/dL for patients with CHD or diabetic patients  with > or = 2 CHD risk factors. SABRA LDL-C is now calculated using the Martin-Hopkins  calculation, which is a validated novel method providing  better accuracy than the Friedewald equation in the  estimation of LDL-C.  Gladis APPLETHWAITE et al. SANDREA. 7986;689(80): 2061-2068  (http://education.[facility].com/faq/FAQ164)    Total CHOL/HDL Ratio 06/18/2024 2.4  <4.9 (calc) Final   Non-HDL Cholesterol (Calc) 06/18/2024 57  <130 mg/dL (calc) Final   Comment: For patients with diabetes plus 1 major ASCVD risk  factor, treating to a non-HDL-C goal of <100 mg/dL  (LDL-C of <29 mg/dL) is considered a therapeutic  option.    Hgb A1c MFr Bld 06/18/2024 6.3 (H)  <5.7 % Final   Comment: For someone without known diabetes, a hemoglobin  A1c value between 5.7% and 6.4% is consistent with prediabetes and should be confirmed with a  follow-up test. . For someone with known diabetes, a value <7% indicates that their diabetes is well controlled. A1c targets should be individualized based on duration of diabetes, age, comorbid conditions, and other considerations. . This assay  result is consistent with an increased risk of diabetes. . Currently, no consensus exists regarding use of hemoglobin A1c for diagnosis of diabetes for children. .    Mean Plasma Glucose 06/18/2024 134  mg/dL Final   eAG (mmol/L) 87/87/7974 7.4  mmol/L Final   PSA 06/18/2024 <0.04  < OR = 4.00 ng/mL Final   Comment: The total PSA value from this assay system is  standardized against the WHO standard. The test  result will be approximately 20% lower when compared  to the equimolar-standardized total PSA (Beckman  Coulter). Comparison of serial PSA results should  be  interpreted with this fact in mind. . This test was performed using the Siemens  chemiluminescent method. Values obtained from  different assay methods cannot be used interchangeably. PSA levels, regardless of value, should not be interpreted as absolute evidence of the presence or absence of disease.      Past Medical History:  Diagnosis Date   Allergy    Bacteremia due to Gram-negative bacteria 04/13/2015   Bladder rupture 2010   Bladder tumor    Cancer [facility])    bladder   Chronic kidney disease    Depression    Gout    History of unilateral nephrectomy 07/2014   [facility]   Hyperlipidemia    Neuromuscular disorder (HCC) 2020   Neuropathy   Restless leg syndrome, controlled    Past Surgical History:  Procedure Laterality Date   APPENDECTOMY     BLADDER SURGERY  2010   COLONOSCOPY  06/05/2009   CYSTOSCOPY W/ RETROGRADES Bilateral 04/21/2013   Procedure: CYSTOSCOPY WITH RETROGRADE PYELOGRAM;  Surgeon: Toribio Neysa Repine, MD;  Location: [facility];  Service: Urology;  Laterality: Bilateral;   FRACTURE SURGERY Right 01/05/2005   lower leg/ankle   NASAL SEPTUM SURGERY     TONSILLECTOMY AND ADENOIDECTOMY     TRANSURETHRAL RESECTION OF BLADDER TUMOR WITH GYRUS (TURBT-GYRUS) N/A 04/21/2013   Procedure: TRANSURETHRAL RESECTION OF BLADDER TUMOR WITH GYRUS (TURBT-GYRUS), cystoscopy;  Surgeon: Toribio Neysa Repine,  MD;  Location: [facility];  Service: Urology;  Laterality: N/A;   Current Outpatient Medications on File Prior to Visit  Medication Sig Dispense Refill   allopurinol  (ZYLOPRIM ) 300 MG tablet TAKE 1 TABLET BY MOUTH DAILY 90 tablet 3   Cholecalciferol (VITAMIN D3) 1.25 MG (50000 UT) TABS      clonazePAM  (KLONOPIN ) 0.5 MG tablet Take 0.5 mg by mouth 2 (two) times daily as needed.     colchicine  0.6 MG tablet Take 0.6-1.2 mg by mouth 2 (two) times daily as needed (for gout flare).      escitalopram  (LEXAPRO ) 20 MG tablet Take 2 tablets (40 mg total) by mouth at bedtime. 180 tablet 3   ezetimibe  (ZETIA ) 10 MG tablet TAKE 1 TABLET BY MOUTH IN THE  MORNING 90 tablet 0   Finerenone  (KERENDIA ) 10 MG TABS Take 1 tablet (10 mg total) by mouth daily. (Patient not taking: Reported on 06/09/2024) 90 tablet 1   fluticasone  (FLONASE ) 50 MCG/ACT nasal spray USE 2 SPRAYS IN BOTH  NOSTRILS DAILY 48 g 3   gabapentin  (NEURONTIN ) 300 MG capsule Take 300 mg by mouth 2 (two) times daily. Takes two pills at night for neuropathy     losartan  (COZAAR ) 50 MG tablet Take 1 tablet (50 mg total) by mouth daily. 90 tablet 3   rOPINIRole  (REQUIP ) 4 MG tablet Take 4 mg by mouth at bedtime.      rosuvastatin  (CRESTOR ) 5 MG tablet TAKE 1 TABLET BY MOUTH DAILY 90 tablet 0   Semaglutide ,0.25 or 0.5MG /DOS, (OZEMPIC , 0.25 OR 0.5 MG/DOSE,) 2 MG/3ML SOPN INJECT 0.25MG  INTO THE SKIN ONE TIME PER WEEK 3 mL 1   traZODone  (DESYREL ) 100 MG tablet Take 50 mg by mouth daily as needed for sleep.     vitamin B-12 (CYANOCOBALAMIN) 1000 MCG tablet Take 1,000 mcg by mouth daily.     No current facility-administered medications on file prior to visit.   Allergies  Allergen Reactions   Augmentin [Amoxicillin-Pot Clavulanate] Nausea And Vomiting and Other (See Comments)    Has patient had a PCN reaction causing  immediate rash, facial/tongue/throat swelling, SOB or lightheadedness with hypotension: No Has patient had a PCN reaction causing severe rash  involving mucus membranes or skin necrosis: No Has patient had a PCN reaction that required hospitalization No Has patient had a PCN reaction occurring within the last 10 years: No If all of the above answers are NO, then may proceed with Cephalosporin use.   Statins Other (See Comments)    Reaction:  Muscle pain    2,4-D Dimethylamine    Amoxicillin    Clavulanic Acid    Nystatin    Social History   Socioeconomic History   Marital status: Married    Spouse name: Ricka   Number of children: 2   Years of education: Not on file   Highest education level: Bachelor's degree (e.g., BA, AB, BS)  Occupational History   Not on file  Tobacco Use   Smoking status: Former    Current packs/day: 0.00    Average packs/day: 0.5 packs/day for 30.0 years (15.0 ttl pk-yrs)    Types: Cigarettes    Start date: 07/25/1959    Quit date: 11/16/1989    Years since quitting: 34.6   Smokeless tobacco: Never  Vaping Use   Vaping status: Never Used  Substance and Sexual Activity   Alcohol use: Yes    Alcohol/week: 2.0 standard drinks of alcohol    Types: 2 Cans of beer per week    Comment: Social   Drug use: No   Sexual activity: Not Currently    Comment: ED,Depression medication  Other Topics Concern   Not on file  Social History Narrative   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   1 son died in 07-26-17.   1 son living.   1 granddaughter.   Social Drivers of Health   Tobacco Use: Medium Risk (06/09/2024)   Patient History    Smoking Tobacco Use: Former    Smokeless Tobacco Use: Never    Passive Exposure: Not on file  Financial Resource Strain: Low Risk (06/24/2024)   Overall Financial Resource Strain (CARDIA)    Difficulty of Paying Living Expenses: Not hard at all  Food Insecurity: No Food Insecurity (06/24/2024)   Epic    Worried About Programme Researcher, Broadcasting/film/video in the Last Year: Never true    Ran Out of Food in the Last Year: Never true  Transportation Needs: No Transportation Needs  (06/24/2024)   Epic    Lack of Transportation (Medical): No    Lack of Transportation (Non-Medical): No  Physical Activity: Insufficiently Active (06/24/2024)   Exercise Vital Sign    Days of Exercise per Week: 1 day    Minutes of Exercise per Session: 20 min  Stress: No Stress Concern Present (06/24/2024)   [employer] of Occupational Health - Occupational Stress Questionnaire    Feeling of Stress: Only a little  Social Connections: Socially Integrated (06/24/2024)   Social Connection and Isolation Panel    Frequency of Communication with Friends and Family: Once a week    Frequency of Social Gatherings with Friends and Family: Twice a week    Attends Religious Services: More than 4 times per year    Active Member of [employer] or Organizations: Yes    Attends Banker Meetings: 1 to 4 times per year    Marital Status: Married  Catering Manager Violence: Not At Risk (06/21/2021)   Humiliation, Afraid, Rape, and Kick questionnaire    Fear of Current or Ex-Partner: No  Emotionally Abused: No    Physically Abused: No    Sexually Abused: No  Depression (PHQ2-9): Low Risk (12/19/2023)   Depression (PHQ2-9)    PHQ-2 Score: 2  Alcohol Screen: Low Risk (06/24/2024)   Alcohol Screen    Last Alcohol Screening Score (AUDIT): 1  Housing: Low Risk (06/24/2024)   Epic    Unable to Pay for Housing in the Last Year: No    Number of Times Moved in the Last Year: 0    Homeless in the Last Year: No  Utilities: Not on file  Health Literacy: Not on file   Family History  Problem Relation Age of Onset   Cancer Mother        ovarian   Cirrhosis Father    Alcohol abuse Father    Alcohol abuse Maternal Uncle    Alcohol abuse Paternal Uncle    Stroke Paternal Aunt    Stroke Paternal Aunt    Alcohol abuse Maternal Uncle    Alcohol abuse Paternal Uncle    Colon cancer Neg Hx    Colon polyps Neg Hx    Esophageal cancer Neg Hx    Rectal cancer Neg Hx    Stomach cancer Neg Hx       Review of Systems  All other systems reviewed and are negative.      Objective:   Physical Exam Vitals reviewed.  Constitutional:      General: He is not in acute distress.    Appearance: He is well-developed. He is not diaphoretic.  HENT:     Head: Normocephalic and atraumatic.     Right Ear: External ear normal.     Left Ear: External ear normal.     Nose: Nose normal.     Mouth/Throat:     Pharynx: No oropharyngeal exudate.  Eyes:     General: No scleral icterus.       Right eye: No discharge.        Left eye: No discharge.     Conjunctiva/sclera: Conjunctivae normal.     Pupils: Pupils are equal, round, and reactive to light.  Neck:     Thyroid : No thyromegaly.     Vascular: No JVD.     Trachea: No tracheal deviation.  Cardiovascular:     Rate and Rhythm: Normal rate and regular rhythm.     Heart sounds: Normal heart sounds. No murmur heard.    No friction rub. No gallop.  Pulmonary:     Effort: Pulmonary effort is normal. No respiratory distress.     Breath sounds: Normal breath sounds. No stridor. No wheezing or rales.  Chest:     Chest wall: No tenderness.  Abdominal:     General: Bowel sounds are normal. There is no distension.     Palpations: Abdomen is soft. There is no mass.     Tenderness: There is no abdominal tenderness. There is no guarding or rebound.  Musculoskeletal:        General: No tenderness or deformity. Normal range of motion.     Cervical back: Normal range of motion and neck supple.  Lymphadenopathy:     Cervical: No cervical adenopathy.  Skin:    General: Skin is warm.     Coloration: Skin is not pale.     Findings: No erythema or rash.  Neurological:     Mental Status: He is alert and oriented to person, place, and time.     Cranial Nerves: No cranial nerve deficit.  Motor: No abnormal muscle tone.     Coordination: Coordination normal.     Deep Tendon Reflexes: Reflexes are normal and symmetric.  Psychiatric:         Behavior: Behavior normal.        Thought Content: Thought content normal.        Judgment: Judgment normal.   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        Assessment & Plan:   CKD stage 3b, GFR 30-44 ml/min (HCC)  Hyperlipidemia, unspecified hyperlipidemia type  General medical exam  Type 2 diabetes mellitus with stage 3b chronic kidney disease, without long-term current use of insulin (HCC) Kidney function is slightly elevated however I believe this is due to slightly being dehydrated when checked his fasting lab work.  He is certainly within his baseline renal function over the last 6 years.  I recommended that we watch this every 4 to 6 months.  His blood pressure today is outstanding.  His cholesterol is excellent.  His hemoglobin A1c is excellent at 6.3.  We cannot check a urine protein creatinine ratio because it is contaminated.  Recommended the COVID-vaccine and RSV vaccine.  We discussed a referral for a stress test however the present time I do not believe that his shortness of breath is related to his heart.  Patient agrees.  We will monitor this clinically

## 2024-06-30 ENCOUNTER — Ambulatory Visit: Payer: Self-pay | Admitting: Pulmonary Disease

## 2024-08-11 ENCOUNTER — Telehealth: Payer: Self-pay

## 2024-08-11 NOTE — Telephone Encounter (Signed)
 Fax received for Ostomy supply order. Order signed and faxed. Mjp,lpn

## 2024-09-21 ENCOUNTER — Ambulatory Visit: Admitting: Pulmonary Disease

## 2024-10-28 ENCOUNTER — Other Ambulatory Visit
